# Patient Record
Sex: Male | Born: 1940 | Race: White | Hispanic: No | Marital: Married | Smoking: Former smoker
Health system: Southern US, Community
[De-identification: ages and names within clinical notes are randomized; demographics above are authoritative.]

## PROBLEM LIST (undated history)

## (undated) DIAGNOSIS — C801 Malignant (primary) neoplasm, unspecified: Secondary | ICD-10-CM

## (undated) DIAGNOSIS — I10 Essential (primary) hypertension: Secondary | ICD-10-CM

## (undated) DIAGNOSIS — N189 Chronic kidney disease, unspecified: Secondary | ICD-10-CM

## (undated) DIAGNOSIS — C61 Malignant neoplasm of prostate: Secondary | ICD-10-CM

## (undated) DIAGNOSIS — M1990 Unspecified osteoarthritis, unspecified site: Secondary | ICD-10-CM

## (undated) DIAGNOSIS — H269 Unspecified cataract: Secondary | ICD-10-CM

## (undated) DIAGNOSIS — E785 Hyperlipidemia, unspecified: Secondary | ICD-10-CM

## (undated) DIAGNOSIS — K5790 Diverticulosis of intestine, part unspecified, without perforation or abscess without bleeding: Secondary | ICD-10-CM

## (undated) DIAGNOSIS — E079 Disorder of thyroid, unspecified: Secondary | ICD-10-CM

## (undated) DIAGNOSIS — Z86010 Personal history of colonic polyps: Principal | ICD-10-CM

## (undated) DIAGNOSIS — G5790 Unspecified mononeuropathy of unspecified lower limb: Secondary | ICD-10-CM

## (undated) DIAGNOSIS — I4821 Permanent atrial fibrillation: Secondary | ICD-10-CM

## (undated) DIAGNOSIS — G629 Polyneuropathy, unspecified: Secondary | ICD-10-CM

## (undated) DIAGNOSIS — I639 Cerebral infarction, unspecified: Secondary | ICD-10-CM

## (undated) DIAGNOSIS — K219 Gastro-esophageal reflux disease without esophagitis: Secondary | ICD-10-CM

## (undated) DIAGNOSIS — D126 Benign neoplasm of colon, unspecified: Secondary | ICD-10-CM

## (undated) DIAGNOSIS — K649 Unspecified hemorrhoids: Secondary | ICD-10-CM

## (undated) DIAGNOSIS — I499 Cardiac arrhythmia, unspecified: Secondary | ICD-10-CM

## (undated) DIAGNOSIS — R7303 Prediabetes: Secondary | ICD-10-CM

## (undated) HISTORY — DX: Unspecified cataract: H26.9

## (undated) HISTORY — DX: Disorder of thyroid, unspecified: E07.9

## (undated) HISTORY — DX: Permanent atrial fibrillation: I48.21

## (undated) HISTORY — PX: COLONOSCOPY: SHX5424

## (undated) HISTORY — PX: HERNIA REPAIR: SHX51

## (undated) HISTORY — DX: Essential (primary) hypertension: I10

## (undated) HISTORY — DX: Cerebral infarction, unspecified: I63.9

## (undated) HISTORY — PX: JOINT REPLACEMENT: SHX530

## (undated) HISTORY — PX: SPINE SURGERY: SHX786

## (undated) HISTORY — DX: Hyperlipidemia, unspecified: E78.5

## (undated) HISTORY — PX: TONSILLECTOMY: SHX5217

## (undated) HISTORY — PX: OTHER SURGICAL HISTORY: SHX169

## (undated) HISTORY — DX: Personal history of colonic polyps: Z86.010

## (undated) HISTORY — DX: Benign neoplasm of colon, unspecified: D12.6

## (undated) HISTORY — DX: Polyneuropathy, unspecified: G62.9

## (undated) HISTORY — DX: Diverticulosis of intestine, part unspecified, without perforation or abscess without bleeding: K57.90

## (undated) HISTORY — DX: Malignant neoplasm of prostate: C61

## (undated) HISTORY — DX: Unspecified osteoarthritis, unspecified site: M19.90

## (undated) HISTORY — DX: Unspecified hemorrhoids: K64.9

---

## 2003-04-27 ENCOUNTER — Encounter: Payer: Self-pay | Admitting: Internal Medicine

## 2004-06-28 ENCOUNTER — Ambulatory Visit: Payer: Self-pay | Admitting: Internal Medicine

## 2005-01-13 ENCOUNTER — Ambulatory Visit: Payer: Self-pay | Admitting: Internal Medicine

## 2005-06-02 ENCOUNTER — Ambulatory Visit: Payer: Self-pay | Admitting: Internal Medicine

## 2005-06-19 ENCOUNTER — Ambulatory Visit: Payer: Self-pay | Admitting: Internal Medicine

## 2005-06-27 ENCOUNTER — Ambulatory Visit: Payer: Self-pay | Admitting: Internal Medicine

## 2005-07-31 ENCOUNTER — Ambulatory Visit: Payer: Self-pay | Admitting: Internal Medicine

## 2005-08-18 ENCOUNTER — Ambulatory Visit: Payer: Self-pay | Admitting: Internal Medicine

## 2005-12-09 ENCOUNTER — Ambulatory Visit: Payer: Self-pay | Admitting: Internal Medicine

## 2005-12-16 ENCOUNTER — Ambulatory Visit: Payer: Self-pay | Admitting: Internal Medicine

## 2006-06-11 ENCOUNTER — Ambulatory Visit: Payer: Self-pay | Admitting: Internal Medicine

## 2006-06-11 LAB — CONVERTED CEMR LAB
ALT: 27 units/L (ref 0–40)
AST: 20 units/L (ref 0–37)
Albumin: 3.7 g/dL (ref 3.5–5.2)
Alkaline Phosphatase: 74 units/L (ref 39–117)
BUN: 21 mg/dL (ref 6–23)
Basophils Absolute: 0 10*3/uL (ref 0.0–0.1)
Basophils Relative: 0.6 % (ref 0.0–1.0)
CO2: 30 meq/L (ref 19–32)
Calcium: 9.3 mg/dL (ref 8.4–10.5)
Chloride: 107 meq/L (ref 96–112)
Chol/HDL Ratio, serum: 6.2
Cholesterol: 165 mg/dL (ref 0–200)
Creatinine, Ser: 1.4 mg/dL (ref 0.4–1.5)
Eosinophil percent: 2.7 % (ref 0.0–5.0)
GFR calc non Af Amer: 54 mL/min
Glomerular Filtration Rate, Af Am: 66 mL/min/{1.73_m2}
Glucose, Bld: 92 mg/dL (ref 70–99)
HCT: 49.2 % (ref 39.0–52.0)
HDL: 26.6 mg/dL — ABNORMAL LOW (ref >39.0)
Hemoglobin: 16.5 g/dL (ref 13.0–17.0)
LDL Cholesterol: 110 mg/dL — ABNORMAL HIGH (ref 0–99)
Lymphocytes Relative: 26.5 % (ref 12.0–46.0)
MCHC: 33.7 g/dL (ref 30.0–36.0)
MCV: 92.6 fL (ref 78.0–100.0)
Monocytes Absolute: 0.6 10*3/uL (ref 0.2–0.7)
Monocytes Relative: 9.8 % (ref 3.0–11.0)
Neutro Abs: 4 10*3/uL (ref 1.4–7.7)
Neutrophils Relative %: 60.4 % (ref 43.0–77.0)
PSA: 2.82 ng/mL (ref 0.10–4.00)
Platelets: 220 10*3/uL (ref 150–400)
Potassium: 4.2 meq/L (ref 3.5–5.1)
RBC: 5.31 M/uL (ref 4.22–5.81)
RDW: 12.5 % (ref 11.5–14.6)
Sodium: 145 meq/L (ref 135–145)
TSH: 1.61 microintl units/mL (ref 0.35–5.50)
Total Bilirubin: 0.9 mg/dL (ref 0.3–1.2)
Total Protein: 6.7 g/dL (ref 6.0–8.3)
Triglyceride fasting, serum: 143 mg/dL (ref 0–149)
VLDL: 29 mg/dL (ref 0–40)
WBC: 6.5 10*3/uL (ref 4.5–10.5)

## 2006-06-18 ENCOUNTER — Ambulatory Visit: Payer: Self-pay | Admitting: Internal Medicine

## 2006-06-18 ENCOUNTER — Encounter: Payer: Self-pay | Admitting: Internal Medicine

## 2006-07-01 ENCOUNTER — Ambulatory Visit: Payer: Self-pay | Admitting: Internal Medicine

## 2006-12-17 ENCOUNTER — Ambulatory Visit: Payer: Self-pay | Admitting: Internal Medicine

## 2006-12-17 LAB — CONVERTED CEMR LAB
ALT: 25 units/L (ref 0–40)
AST: 22 units/L (ref 0–37)
Albumin: 3.9 g/dL (ref 3.5–5.2)
Alkaline Phosphatase: 74 units/L (ref 39–117)
Bilirubin, Direct: 0.1 mg/dL (ref 0.0–0.3)
Cholesterol: 176 mg/dL (ref 0–200)
HDL: 29.2 mg/dL — ABNORMAL LOW (ref >39.0)
LDL Cholesterol: 107 mg/dL — ABNORMAL HIGH (ref 0–99)
Total Bilirubin: 0.9 mg/dL (ref 0.3–1.2)
Total CHOL/HDL Ratio: 6
Total Protein: 6.4 g/dL (ref 6.0–8.3)
Triglycerides: 197 mg/dL — ABNORMAL HIGH (ref 0–149)
VLDL: 39 mg/dL (ref 0–40)

## 2007-04-06 ENCOUNTER — Ambulatory Visit: Payer: Self-pay | Admitting: Internal Medicine

## 2007-04-06 DIAGNOSIS — K5730 Diverticulosis of large intestine without perforation or abscess without bleeding: Secondary | ICD-10-CM | POA: Insufficient documentation

## 2007-04-06 DIAGNOSIS — E785 Hyperlipidemia, unspecified: Secondary | ICD-10-CM | POA: Insufficient documentation

## 2007-04-06 DIAGNOSIS — I4891 Unspecified atrial fibrillation: Secondary | ICD-10-CM

## 2007-04-07 DIAGNOSIS — G609 Hereditary and idiopathic neuropathy, unspecified: Secondary | ICD-10-CM | POA: Insufficient documentation

## 2007-04-07 DIAGNOSIS — I10 Essential (primary) hypertension: Secondary | ICD-10-CM

## 2007-04-09 ENCOUNTER — Ambulatory Visit: Payer: Self-pay | Admitting: Internal Medicine

## 2007-07-15 ENCOUNTER — Ambulatory Visit: Payer: Self-pay | Admitting: Internal Medicine

## 2007-07-16 LAB — CONVERTED CEMR LAB
ALT: 30 units/L (ref 0–53)
AST: 23 units/L (ref 0–37)
Albumin: 3.9 g/dL (ref 3.5–5.2)
Alkaline Phosphatase: 78 units/L (ref 39–117)
BUN: 16 mg/dL (ref 6–23)
Bilirubin, Direct: 0.2 mg/dL (ref 0.0–0.3)
CO2: 29 meq/L (ref 19–32)
Calcium: 9.3 mg/dL (ref 8.4–10.5)
Chloride: 104 meq/L (ref 96–112)
Cholesterol: 182 mg/dL (ref 0–200)
Creatinine, Ser: 1.4 mg/dL (ref 0.4–1.5)
GFR calc Af Amer: 65 mL/min
GFR calc non Af Amer: 54 mL/min
Glucose, Bld: 95 mg/dL (ref 70–99)
HDL: 27.9 mg/dL — ABNORMAL LOW (ref >39.0)
LDL Cholesterol: 128 mg/dL — ABNORMAL HIGH (ref 0–99)
PSA: 3.31 ng/mL (ref 0.10–4.00)
Potassium: 4.7 meq/L (ref 3.5–5.1)
Sodium: 143 meq/L (ref 135–145)
TSH: 1.3 microintl units/mL (ref 0.35–5.50)
Total Bilirubin: 1.1 mg/dL (ref 0.3–1.2)
Total CHOL/HDL Ratio: 6.5
Total Protein: 6.7 g/dL (ref 6.0–8.3)
Triglycerides: 133 mg/dL (ref 0–149)
VLDL: 27 mg/dL (ref 0–40)

## 2008-02-09 ENCOUNTER — Ambulatory Visit: Payer: Self-pay | Admitting: Internal Medicine

## 2008-02-09 LAB — CONVERTED CEMR LAB
ALT: 31 units/L (ref 0–53)
Bilirubin, Direct: 0.2 mg/dL (ref 0.0–0.3)
HDL: 29.9 mg/dL — ABNORMAL LOW (ref >39.0)
LDL Cholesterol: 125 mg/dL — ABNORMAL HIGH (ref 0–99)
Total Bilirubin: 1.1 mg/dL (ref 0.3–1.2)
Total CHOL/HDL Ratio: 6.3
VLDL: 34 mg/dL (ref 0–40)

## 2008-08-10 ENCOUNTER — Ambulatory Visit: Payer: Self-pay | Admitting: Internal Medicine

## 2008-08-11 LAB — CONVERTED CEMR LAB
ALT: 28 units/L (ref 0–53)
AST: 22 units/L (ref 0–37)
Albumin: 3.8 g/dL (ref 3.5–5.2)
BUN: 19 mg/dL (ref 6–23)
Calcium: 9.4 mg/dL (ref 8.4–10.5)
Chloride: 105 meq/L (ref 96–112)
Cholesterol: 171 mg/dL (ref 0–200)
Creatinine, Ser: 1.6 mg/dL — ABNORMAL HIGH (ref 0.4–1.5)
GFR calc non Af Amer: 46 mL/min
HDL: 31 mg/dL — ABNORMAL LOW (ref >39.0)
LDL Cholesterol: 102 mg/dL — ABNORMAL HIGH (ref 0–99)
Total CHOL/HDL Ratio: 5.5
Triglycerides: 189 mg/dL — ABNORMAL HIGH (ref 0–149)

## 2008-08-25 ENCOUNTER — Ambulatory Visit: Payer: Self-pay | Admitting: Internal Medicine

## 2008-09-20 ENCOUNTER — Encounter: Payer: Self-pay | Admitting: Internal Medicine

## 2008-09-20 ENCOUNTER — Ambulatory Visit: Payer: Self-pay

## 2009-02-14 ENCOUNTER — Ambulatory Visit: Payer: Self-pay | Admitting: Internal Medicine

## 2009-02-16 LAB — CONVERTED CEMR LAB
ALT: 27 units/L (ref 0–53)
AST: 23 units/L (ref 0–37)
BUN: 21 mg/dL (ref 6–23)
Bilirubin, Direct: 0 mg/dL (ref 0.0–0.3)
Calcium: 9.1 mg/dL (ref 8.4–10.5)
Cholesterol: 175 mg/dL (ref 0–200)
GFR calc non Af Amer: 49.52 mL/min (ref >60)
Glucose, Bld: 92 mg/dL (ref 70–99)
HDL: 32.4 mg/dL — ABNORMAL LOW (ref >39.00)
TSH: 1.38 microintl units/mL (ref 0.35–5.50)
Total Bilirubin: 1 mg/dL (ref 0.3–1.2)
Total Protein: 6.9 g/dL (ref 6.0–8.3)

## 2009-08-29 ENCOUNTER — Ambulatory Visit: Payer: Self-pay | Admitting: Internal Medicine

## 2009-09-06 ENCOUNTER — Telehealth: Payer: Self-pay | Admitting: Internal Medicine

## 2010-02-27 ENCOUNTER — Ambulatory Visit: Payer: Self-pay | Admitting: Internal Medicine

## 2010-02-28 LAB — CONVERTED CEMR LAB
ALT: 31 units/L (ref 0–53)
AST: 23 units/L (ref 0–37)
Alkaline Phosphatase: 72 units/L (ref 39–117)
Bilirubin, Direct: 0.1 mg/dL (ref 0.0–0.3)
Calcium: 8.3 mg/dL — ABNORMAL LOW (ref 8.4–10.5)
Eosinophils Relative: 2.6 % (ref 0.0–5.0)
GFR calc non Af Amer: 61.5 mL/min (ref >60)
Glucose, Bld: 91 mg/dL (ref 70–99)
HCT: 45.5 % (ref 39.0–52.0)
Lymphs Abs: 1.2 10*3/uL (ref 0.7–4.0)
MCHC: 34.3 g/dL (ref 30.0–36.0)
MCV: 93.8 fL (ref 78.0–100.0)
Monocytes Absolute: 0.5 10*3/uL (ref 0.1–1.0)
Platelets: 185 10*3/uL (ref 150.0–400.0)
RDW: 13.4 % (ref 11.5–14.6)
Sodium: 135 meq/L (ref 135–145)
Total Bilirubin: 0.9 mg/dL (ref 0.3–1.2)

## 2010-08-30 ENCOUNTER — Ambulatory Visit
Admission: RE | Admit: 2010-08-30 | Discharge: 2010-08-30 | Payer: Self-pay | Source: Home / Self Care | Attending: Internal Medicine | Admitting: Internal Medicine

## 2010-08-30 ENCOUNTER — Encounter: Payer: Self-pay | Admitting: Internal Medicine

## 2010-08-30 DIAGNOSIS — R350 Frequency of micturition: Secondary | ICD-10-CM | POA: Insufficient documentation

## 2010-08-30 LAB — CONVERTED CEMR LAB
Bilirubin Urine: NEGATIVE
Blood in Urine, dipstick: NEGATIVE
Glucose, Urine, Semiquant: NEGATIVE
Ketones, urine, test strip: NEGATIVE
Specific Gravity, Urine: >=1.03
pH: 5

## 2010-09-03 NOTE — Progress Notes (Signed)
Summary: please update pharmacy info  Phone Note Call from Patient Call back at Home Phone [phone]   Caller: Patient-live call Summary of Call: Was seen on 08-29-2009. [facility] Pt stated that he uses [facility] on [address]. Please resend and note this pharmacy in the refill section in chart. Thanks. Initial call taken by: Warnell Forester,  September 06, 2009 1:50 PM  Follow-up for Phone Call        left message for pt to call back with which [facility] [address].  One is [phone] from [address] and the other is [phone] [place].  Pt to call back. Follow-up by: Gladis Riffle, RN,  September 07, 2009 10:31 AM  Additional Follow-up for Phone Call Additional follow up Details #1::        [facility] [address] [facility] [address] Additional Follow-up by: Gladis Riffle, RN,  September 07, 2009 12:12 PM

## 2010-09-03 NOTE — Assessment & Plan Note (Signed)
Summary: 6 month rov/njr   Vital Signs:  Patient profile:   70 year old male Height:      74.75 inches Weight:      224 pounds BMI:     28.29 Temp:     99.0 degrees F oral Pulse rate:   64 / minute Pulse rhythm:   regular BP sitting:   160 / 102  (left arm) Cuff size:   regular  Vitals Entered By: Kern Reap CMA Duncan Dull) (February 27, 2010 8:01 AM) CC: follow-up visit   CC:  follow-up visit.  History of Present Illness:  Follow-Up Visit      This is a 70 year old man who presents for Follow-up visit.  The patient denies chest pain and palpitations.  Since the last visit the patient notes no new problems or concerns.  The patient reports taking meds as prescribed.  When questioned about possible medication side effects, the patient notes none.    All other systems reviewed and were negative   Current Problems (verified): 1)  Peripheral Neuropathy  (ICD-356.9) 2)  Hypertension  (ICD-401.9) 3)  Hyperlipidemia  (ICD-272.4) 4)  Diverticulosis, Colon  (ICD-562.10) 5)  Atrial Fibrillation  (ICD-427.31)  Current Medications (verified): 1)  Simvastatin 20 Mg Tabs (Simvastatin) .... One By Mouth Daily As Directed 2)  Toprol Xl 50 Mg  Tb24 (Metoprolol Succinate) .... One By Mouth Daily 3)  Aspirin 325 Mg Tabs (Aspirin) .... 1/2 Once Daily or 81mg  Once Daily  Allergies (verified): No Known Drug Allergies  Past History:  Past Medical History: Last updated: 02/09/2008 Atrial fibrillation Hypertension Peripheral neuropathy--chronic, has had neurology evaluation  Past Surgical History: Last updated: 08/10/2008 Eye Surgery  Family History: Last updated: 04/06/2007 Family History Diabetes 1st degree relative Family History Hypertension Family History of Cardiovascular disorder  Social History: Last updated: 04/06/2007 Retired Married Former Smoker Alcohol use-no  Risk Factors: Smoking Status: quit (08/29/2009)  Physical Exam  General:  alert and well-developed.     Head:  normocephalic and atraumatic.   Eyes:  pupils equal and pupils round.   Ears:  R ear normal and L ear normal.   Neck:  No deformities, masses, or tenderness noted. Chest Wall:  No deformities, masses, tenderness or gynecomastia noted. Lungs:  Normal respiratory effort, chest expands symmetrically. Lungs are clear to auscultation, no crackles or wheezes. Heart:  normal rate and irregular rhythm.   Abdomen:  soft and non-tender.   Msk:  No deformity or scoliosis noted of thoracic or lumbar spine.   Neurologic:  cranial nerves II-XII intact and gait normal.   Skin:  turgor normal and color normal.   Psych:  good eye contact and not anxious appearing.     Impression & Recommendations:  Problem # 1:  PERIPHERAL NEUROPATHY (ICD-356.9) constant problem relatively unchanged  Problem # 2:  HYPERTENSION (ICD-401.9)  repeat BP 138/88 will continue same meds His updated medication list for this problem includes:    Toprol Xl 50 Mg Tb24 (Metoprolol succinate) ..... One by mouth daily  Orders: TLB-BMP (Basic Metabolic Panel-BMET) (80048-METABOL)  Problem # 3:  ATRIAL FIBRILLATION (ICD-427.31)  no sxs rate controlled discussed anticoagulation His updated medication list for this problem includes:    Toprol Xl 50 Mg Tb24 (Metoprolol succinate) ..... One by mouth daily    Aspirin 325 Mg Tabs (Aspirin) .Marland Kitchen... 1/2 once daily or 81mg  once daily  Orders: TLB-TSH (Thyroid Stimulating Hormone) (84443-TSH) TLB-CBC Platelet - w/Differential (85025-CBCD)  Complete Medication List: 1)  Simvastatin 20  Mg Tabs (Simvastatin) .... One by mouth daily as directed 2)  Toprol Xl 50 Mg Tb24 (Metoprolol succinate) .... One by mouth daily 3)  Aspirin 325 Mg Tabs (Aspirin) .... 1/2 once daily or 81mg  once daily  Other Orders: Venipuncture (02725) TLB-Lipid Panel (80061-LIPID) TLB-Hepatic/Liver Function Pnl (80076-HEPATIC)  Appended Document: Orders Update     Clinical Lists  Changes  Orders: Added new Service order of Specimen Handling (36644) - Signed

## 2010-09-03 NOTE — Assessment & Plan Note (Signed)
Summary: 6 month rov/njr rsc bmp/njr---PT RSC (BMP) // RS   Vital Signs:  Patient profile:   70 year old male Weight:      228 pounds Temp:     98.3 degrees F Pulse rate:   68 / minute Resp:     12 per minute BP sitting:   124 / 76  (left arm)  Vitals Entered By: Gladis Riffle, RN (August 29, 2009 8:18 AM)   History of Present Illness:  Follow-Up Visit      This is a 70 year old man who presents for Follow-up visit.  The patient denies chest pain, palpitations, dizziness, syncope, edema, SOB, DOE, PND, and orthopnea.  Since the last visit the patient notes no new problems or concerns.  The patient reports taking meds as prescribed.  When questioned about possible medication side effects, the patient notes none.    All other systems reviewed and were negative   Preventive Screening-Counseling & Management  Alcohol-Tobacco     Smoking Status: quit  Current Problems (verified): 1)  Peripheral Neuropathy  (ICD-356.9) 2)  Hypertension  (ICD-401.9) 3)  Hyperlipidemia  (ICD-272.4) 4)  Diverticulosis, Colon  (ICD-562.10) 5)  Atrial Fibrillation  (ICD-427.31)  Current Medications (verified): 1)  Simvastatin 20 Mg Tabs (Simvastatin) .... One By Mouth Daily As Directed 2)  Toprol Xl 50 Mg  Tb24 (Metoprolol Succinate) .... One By Mouth Daily 3)  Aspirin 325 Mg Tabs (Aspirin) .... 1/2 Once Daily or 81mg  Once Daily  Allergies (verified): No Known Drug Allergies  Comments:  Nurse/Medical Assistant: 6 month rov, fasting  The patient's medications and allergies were reviewed with the patient and were updated in the Medication and Allergy Lists. Gladis Riffle, RN (August 29, 2009 8:19 AM)  Past History:  Past Medical History: Last updated: 02/09/2008 Atrial fibrillation Hypertension Peripheral neuropathy--chronic, has had neurology evaluation  Past Surgical History: Last updated: 08/10/2008 Eye Surgery  Family History: Last updated: 04/06/2007 Family History Diabetes 1st  degree relative Family History Hypertension Family History of Cardiovascular disorder  Social History: Last updated: 04/06/2007 Retired Married Former Smoker Alcohol use-no  Risk Factors: Smoking Status: quit (08/29/2009)  Physical Exam  General:  alert and well-developed.   Head:  normocephalic and atraumatic.   Eyes:  pupils equal and pupils round.   Ears:  R ear normal and L ear normal.   Neck:  No deformities, masses, or tenderness noted. Chest Wall:  No deformities, masses, tenderness or gynecomastia noted. Lungs:  Normal respiratory effort, chest expands symmetrically. Lungs are clear to auscultation, no crackles or wheezes. Heart:  normal rate and regular rhythm.   Abdomen:  soft and non-tender.   Msk:  No deformity or scoliosis noted of thoracic or lumbar spine.   Pulses:  R radial normal and L radial normal.   Neurologic:  alert & oriented X3 and cranial nerves II-XII intact.   Skin:  turgor normal and color normal.   Psych:  normally interactive and good eye contact.     Impression & Recommendations:  Problem # 1:  PERIPHERAL NEUROPATHY (ICD-356.9)  discussed no need for eval or treatment at this time  Orders: Prescription Created Electronically [phone])  Problem # 2:  HYPERTENSION (ICD-401.9) controlled His updated medication list for this problem includes:    Toprol Xl 50 Mg Tb24 (Metoprolol succinate) ..... One by mouth daily  BP today: 124/76 Prior BP: 140/86 (02/14/2009)  Labs Reviewed: K+: 4.5 (02/14/2009) Creat: : 1.5 (02/14/2009)   Chol: 175 (02/14/2009)   HDL:  32.40 (02/14/2009)   LDL: 108 (02/14/2009)   TG: 173.0 (02/14/2009)  Orders: Prescription Created Electronically [phone])  Problem # 3:  ATRIAL FIBRILLATION (ICD-427.31)  discussed  at length disucssed pradaxa-side effects discussed he will consider informaton given His updated medication list for this problem includes:    Toprol Xl 50 Mg Tb24 (Metoprolol succinate) ..... One by  mouth daily    Aspirin 325 Mg Tabs (Aspirin) .Marland Kitchen... 1/2 once daily or 81mg  once daily  Orders: Prescription Created Electronically [phone])  Complete Medication List: 1)  Simvastatin 20 Mg Tabs (Simvastatin) .... One by mouth daily as directed 2)  Toprol Xl 50 Mg Tb24 (Metoprolol succinate) .... One by mouth daily 3)  Aspirin 325 Mg Tabs (Aspirin) .... 1/2 once daily or 81mg  once daily  Preventive Care Screening  Colonoscopy:    Date:  08/05/2003    Next Due:  09/2019    Results:  normal-pt's report    Patient Instructions: 1)  Please schedule a follow-up appointment in 6 months. 2)  look up pradaxa (on line) Prescriptions: TOPROL XL 50 MG  TB24 (METOPROLOL SUCCINATE) one by mouth daily  #90 x 3   Entered and Authorized by:   Birdie Sons MD   Signed by:   Birdie Sons MD on 08/29/2009   Method used:   [facility] to        [facility]  [phone]* (retail)       [address] [place], [place]  [address]       Ph: [phone] or [phone]       Fax: [phone]   RxID:   [id number] SIMVASTATIN 20 MG TABS (SIMVASTATIN) one by mouth daily as directed  #30 Tablet x 4   Entered and Authorized by:   Birdie Sons MD   Signed by:   Birdie Sons MD on 08/29/2009   Method used:   [facility]        [facility]  [phone]* (retail)       [address] [place], [place]  [address]       Ph: [phone] or [phone]       Fax: [phone]   RxID:   [id number]

## 2010-09-05 NOTE — Assessment & Plan Note (Signed)
Summary: ?prostate inf/njr   Vital Signs:  Patient profile:   70 year old male Weight:      229 pounds Temp:     99.5 degrees F oral BP sitting:   118 / 72  (right arm) Cuff size:   regular  Vitals Entered By: Duard Brady LPN (August 30, 2010 8:15 AM) CC: c/o frequent urination , achy joints , ??prostate infection Is Patient Diabetic? No   CC:  c/o frequent urination , achy joints , and ??prostate infection.  Allergies (verified): No Known Drug Allergies   Complete Medication List: 1)  Simvastatin 20 Mg Tabs (Simvastatin) .... One by mouth daily as directed 2)  Toprol Xl 50 Mg Tb24 (Metoprolol succinate) .... One by mouth daily 3)  Aspirin 325 Mg Tabs (Aspirin) .... 1/2 once daily or 81mg  once daily 4)  Ciprofloxacin Hcl 500 Mg Tabs (Ciprofloxacin hcl) .... One twice daily  Other Orders: UA Dipstick W/ Micro (manual) (45409)  Patient Instructions: 1)  Limit your Sodium (Salt). 2)  Take your antibiotic as prescribed until ALL of it is gone, but stop if you develop a rash or swelling and contact our office as soon as possible. 3)  CPX  Dr Cato Mulligan 4-6 months Prescriptions: CIPROFLOXACIN HCL 500 MG TABS (CIPROFLOXACIN HCL) one twice daily  #50 x 0   Entered and Authorized by:   Gordy Savers  MD   Signed by:   Gordy Savers  MD on 08/30/2010   Method used:   Electronically to        [facility]. [phone]* (retail)       [phone] [facility].       [place], [place]  [address]       Ph: [phone]       Fax: [phone]   RxID:   [id number] TOPROL XL 50 MG  TB24 (METOPROLOL SUCCINATE) one by mouth daily  #30 Tablet x 3   Entered and Authorized by:   Gordy Savers  MD   Signed by:   Gordy Savers  MD on 08/30/2010   Method used:   Electronically to        [facility]. [phone]* (retail)       [phone] [facility].       [place], [place]  [address]       Ph: [phone]  Fax: [phone]   RxID:   [id number] SIMVASTATIN 20 MG TABS (SIMVASTATIN) one by mouth daily as directed  #30 Tablet x 11   Entered and Authorized by:   Gordy Savers  MD   Signed by:   Gordy Savers  MD on 08/30/2010   Method used:   Electronically to        [facility]. [phone]* (retail)       [phone] [facility]       [place], [place]  [address]       Ph: [phone]       Fax: [phone]   RxID:   [id number]    Orders Added: 1)  UA Dipstick W/ Micro (manual) [81000]    Laboratory Results   Urine Tests  Date/Time Received: August 30, 2010 8:22 AM  Date/Time Reported: August 30, 2010 8:22 AM   Routine Urinalysis   Color: straw Appearance: Hazy Glucose: negative   (Normal Range:  Negative) Bilirubin: negative   (Normal Range: Negative) Ketone: negative   (Normal Range: Negative) Spec. Gravity: >=1.030   (Normal Range: 1.003-1.035) Blood: negative   (Normal Range: Negative) pH: 5.0   (Normal Range: 5.0-8.0) Protein: trace   (Normal Range: Negative) Urobilinogen: 0.2   (Normal Range: 0-1) Nitrite: negative   (Normal Range: Negative) Leukocyte Esterace: small   (Normal Range: Negative)       Appended Document: ?prostate inf/njr  70 year old patient who presents with a several-day history urinary frequency, mild discomfort low-grade fever and mild myalgias.  He states that he has remote history of prostate infections in the past.  He has a history of permanent atrial for ablation, but has declined Coumadin anticoagulation.  He has mild treated hypertension and dyslipidemia. Examination temperature 99.5 blood pressure 119/72  ENT-unremarkable Neck- no adenopathy chest clear cardio-vascular- irregular rhythm with controlled ventricular response abdomen-soft and nontender.  No organomegaly  Impression acute prostatitis hypertension stable chronic atrial fibrillation  Disposition.  Will treat with a 25 day  supply of Cipro 500 mg b.i.d..  will set up for a full physical

## 2010-09-05 NOTE — Miscellaneous (Signed)
Summary: Orders Update  Clinical Lists Changes  Orders: Added new Service order of Est. Patient Level III (99213) - Signed 

## 2010-09-13 ENCOUNTER — Ambulatory Visit (INDEPENDENT_AMBULATORY_CARE_PROVIDER_SITE_OTHER): Payer: Medicare Other | Admitting: Internal Medicine

## 2010-09-13 ENCOUNTER — Encounter: Payer: Self-pay | Admitting: Internal Medicine

## 2010-09-13 DIAGNOSIS — N419 Inflammatory disease of prostate, unspecified: Secondary | ICD-10-CM

## 2010-09-13 DIAGNOSIS — R350 Frequency of micturition: Secondary | ICD-10-CM

## 2010-09-13 DIAGNOSIS — I10 Essential (primary) hypertension: Secondary | ICD-10-CM

## 2010-09-13 MED ORDER — DOXYCYCLINE HYCLATE 100 MG PO TABS: 100.0000 mg | ORAL_TABLET | Freq: Two times a day (BID) | ORAL | Status: AC

## 2010-09-13 MED ORDER — TAMSULOSIN HCL 0.4 MG PO CAPS: 0.4000 mg | ORAL_CAPSULE | Freq: Every day | ORAL | Status: DC

## 2010-09-13 MED ORDER — DOXYCYCLINE HYCLATE 100 MG PO TABS: 100.0000 mg | ORAL_TABLET | Freq: Two times a day (BID) | ORAL | Status: DC

## 2010-09-13 NOTE — Patient Instructions (Signed)
Take your antibiotic as prescribed until ALL of it is gone, but stop if you develop a rash, swelling, or any side effects of the medication.  Contact our office as soon as possible if  there are side effects of the medication.  Call or return to clinic prn if these symptoms worsen or fail to improve as anticipated.  

## 2010-09-13 NOTE — Progress Notes (Signed)
  Subjective:    Patient ID: Elijah [name], male    DOB: [birth year], 70 y.o.   MRN: [id number]  HPI  70 year old patient who has a remote history of acute prostatitis, who was seen two weeks ago with urinary frequency, urgency, and slow urinary stream.  He was placed on Cipro 5oo  mg b.i.d. and during   the past two weeks symptoms have not improved.  He denies any fever, chills, or constitutional complaints.  No hematuria, or pyuria.  Denies any back or flank pain.   Review of Systems  Genitourinary: Positive for urgency, frequency, decreased urine volume and difficulty urinating. Negative for hematuria, flank pain, discharge, penile swelling, scrotal swelling, genital sores, penile pain and testicular pain.       Objective:   Physical Exam  Genitourinary: Rectum normal and penis normal. Guaiac negative stool. No penile tenderness.       Prostate plus 3 enlarged and slightly tender          Assessment & Plan:  Acute prostatitis-  Will discontinue Cipro and start doxycycline, which has been helpful in the past.  He is quite bothered by his obstructive symptoms and will also placed on Flomax.  He will call if unimproved.  He is scheduled for a physical in the summer.  Laboratory studies included a PSA will be checked at that time

## 2010-12-17 NOTE — Letter (Signed)
August 25, 2008    Bruce H. Swords, MD  [address] [place], [place] [address]   RE:  IFEANYICHUKWU, WICKHAM  MRN:  [id number]  /  DOB:  1940/09/11   Dear Dr.  Cato Mulligan:   It was my pleasure to see your patient, Rykker Coviello in electrophysiology  consultation today.  As you recall, he is a very pleasant 69 year old  gentleman with a history of persistent atrial fibrillation and  hypertension.  He reports initially being diagnosed with atrial  fibrillation around 1998 after presenting to his primary care physician  for routine physical.  He was noted to have an irregular pulse and his  EKG documented atrial fibrillation at that time.  He is unable to recall  when any symptoms of atrial fibrillation may have initially began.  He  reports tolerating atrial fibrillation quite well and has never been  cardioverted or treated with antiarrhythmic medications.  As prior  strategy has been primarily rate control.  He is previously declined  Coumadin therapy.  He reports doing rather well recently.  He is unaware  of any significant limitations to his quality of life or activities.  He  reports that he is able to do his own yard work and activities around  the house.  Though, he has previously performed regular exercise he has  not done this over the past few months.  He denies any prior  cerebrovascular sequela from his atrial fibrillation.  He denies any  recent episodes of chest pain, shortness of breath, orthopnea, PND,  palpitations, presyncope, syncope, or fatigue.  He is otherwise without  complaint today.   PAST MEDICAL HISTORY:  1. Hypertension.  2. Peripheral neuropathy.  3. Persistent atrial fibrillation (as above).  4. He denies any history of rheumatic heart disease.   Transthoracic echocardiogram performed, November 19, 2000, revealed a  normal left ventricular size and function.  The left atrium was mildly  dilated.  The patient was noted to have mild mitral regurgitation at  that time.   MEDICATIONS:  1. Aspirin 81 mg daily.  2. Simvastatin 10 mg daily.  3. Toprol-XL 50 mg daily.   ALLERGIES:  No known drug allergies.   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  He is retired from [employer].  He has a history  of infrequent tobacco use, but quit in 1970.  He denies alcohol or drug  use.   FAMILY HISTORY:  He is unaware of any significant family history  including diabetes and hypertension.  His parents are both alive and  healthy.   REVIEW OF SYSTEMS:  All systems were reviewed and negative except as  outlined in the HPI above.   PHYSICAL EXAMINATION:  VITAL SIGNS:  Blood pressure 126/90, heart rate  83, respirations 18, and weight 233 pounds.  GENERAL:  The patient is a well-appearing male in no acute distress.  He  is alert and oriented x3.  HEENT:  Normocephalic and atraumatic.  Sclerae clear.  Conjunctivae  pink.  Oropharynx clear.  NECK:  Supple.  No thyromegaly, lymphadenopathy, or bruits.  LUNGS:  Clear to auscultation bilaterally.  HEART:  Irregularly irregular rhythm.  No murmurs, rubs, or gallops.  GI:  Soft, nontender, and nondistended.  Positive bowel sounds.  EXTREMITIES:  No clubbing, cyanosis, or edema.  NEUROLOGIC:  Strength and sensation are intact.  SKIN:  No ecchymosis or lacerations.  MUSCULOSKELETAL:  No deformity or atrophy.  PSYCHIATRIC:  Euthymic mood.  Full affect.   EKG today reveals atrial fibrillation with an average ventricular rate  of 80 beats per minute, otherwise unremarkable.   IMPRESSION:  Mr. Osoria is a pleasant 70 year old gentleman with a  history of persistent atrial fibrillation and hypertension.  His blood  pressure appears to be reasonably well controlled.  The patient is  relatively asymptomatic from atrial fibrillation and appears presently  to be rate controlled at rest.  He has previously declined  anticoagulation with Coumadin.   Therapeutic  strategies for atrial fibrillation including both medicine  and catheter-based therapies were discussed in detail today.  The  patient's CHADS2 score is 1.  Based on a AHA/ACC guidelines, the  recommendation is either aspirin 325 mg daily or Coumadin for stroke  prevention.  I have discussed risks and benefits of Coumadin with the  patient.  He would prefer to continue his present strategy of aspirin  alone.  I think that this is a reasonable strategy though we can  certainly not guarantee that he will not have a cerebrovascular event.  I recommended that the patient assess his heart rate with regular  activities at home.  He is aware that his heart rate should be less than  80 beats per minute at rest and ideally less than 100 beats per minute  with regular activities around his house.  Should he have any  significant increase in heart rate then I think that a 24-hour Holter  would be beneficial to assess his heart rate control.  Presently, I  think that we should continue his regular dose of Toprol-XL.  As he is  minimally symptomatic from atrial fibrillation, I think that we should  continue a rate control strategy at this time.  Neither antiarrhythmic  medications or catheter ablation have been shown to prolong life or  reduce risk for stroke.  I, therefore think that these measures should  be reserved for any impairment in quality of life related to atrial  fibrillation.  Should he develop symptomatic atrial fibrillation down  the road and we could consider a rhythm control strategy; however, as he  has had prolonged atrial fibrillation, I think that this would be a  formidable task.  I think that we should obtain a transthoracic  echocardiogram to evaluate size of his left atrium and also to evaluate  for any progressive mitral valve disease.   PLAN:  1. Aspirin 325 mg daily.  2. Toprol-XL 50 mg daily.  3. The patient is encouraged to check his pulse regularly at home and       contact our office if he has rapid ventricular rates.  4. Transthoracic echocardiogram is ordered.  5. The patient will follow up in my office as needed.  6. The patient will follow up with Dr. Cato Mulligan as previously scheduled.   Thank you for the opportunity of participating in the care of Mr. Brennen.  Please feel free to contact me  if I can be of further assistance.    Sincerely,      Hillis Range, MD  Electronically Signed    JA/MedQ  DD: 08/25/2008  DT: 08/26/2008  Job #: [id number]

## 2010-12-25 ENCOUNTER — Ambulatory Visit: Payer: Self-pay | Admitting: Internal Medicine

## 2011-01-01 ENCOUNTER — Ambulatory Visit: Payer: Self-pay | Admitting: Internal Medicine

## 2011-02-17 ENCOUNTER — Encounter: Payer: Self-pay | Admitting: Internal Medicine

## 2011-02-17 ENCOUNTER — Ambulatory Visit (INDEPENDENT_AMBULATORY_CARE_PROVIDER_SITE_OTHER): Payer: Medicare Other | Admitting: Internal Medicine

## 2011-02-17 DIAGNOSIS — K625 Hemorrhage of anus and rectum: Secondary | ICD-10-CM

## 2011-02-17 DIAGNOSIS — I10 Essential (primary) hypertension: Secondary | ICD-10-CM

## 2011-02-17 MED ORDER — HYDROCORTISONE ACETATE 25 MG RE SUPP: 25.0000 mg | Freq: Two times a day (BID) | RECTAL | Status: AC

## 2011-02-17 NOTE — Patient Instructions (Signed)
Medications as directed Call or return to clinic prn if these symptoms worsen or fail to improve as anticipated.

## 2011-02-17 NOTE — Progress Notes (Signed)
  Subjective:    Patient ID: Elijah [name], male    DOB: Jun 25, 1941, 70 y.o.   MRN: [id number]  HPI 70 year old patient who presents with a 2 to three-week history of small-volume bright red rectal bleeding. He does describe a small amount of fresh blood on the tissue paper. Denies any rectal pain or change in his bowel habits. He did have a colonoscopy in 2004   Review of Systems  Gastrointestinal: Positive for blood in stool and anal bleeding. Negative for abdominal pain, constipation and abdominal distention.       Objective:   Physical Exam  Genitourinary: Rectum normal. Guaiac negative stool.       Rectal exam was nonrevealing no obvious internal hemorrhoid. No significant stool- heme negative          Assessment & Plan:   Bright red rectal bleeding. We'll treat with Anusol-HC suppositories twice a day and clinically observe. He will call if bleeding persists

## 2011-03-12 ENCOUNTER — Encounter: Payer: Self-pay | Admitting: Gastroenterology

## 2011-03-12 ENCOUNTER — Encounter: Payer: Self-pay | Admitting: Internal Medicine

## 2011-03-12 ENCOUNTER — Telehealth: Payer: Self-pay | Admitting: Gastroenterology

## 2011-03-12 ENCOUNTER — Ambulatory Visit (INDEPENDENT_AMBULATORY_CARE_PROVIDER_SITE_OTHER): Payer: Medicare Other | Admitting: Internal Medicine

## 2011-03-12 VITALS — BP 126/74 | Temp 98.2°F | Wt 230.0 lb

## 2011-03-12 DIAGNOSIS — K625 Hemorrhage of anus and rectum: Secondary | ICD-10-CM | POA: Insufficient documentation

## 2011-03-12 DIAGNOSIS — I4891 Unspecified atrial fibrillation: Secondary | ICD-10-CM

## 2011-03-12 NOTE — Assessment & Plan Note (Signed)
Reviewed previous colonoscopy Given duration of sxs I think he needs another colonoscopy (it's been 8 years)

## 2011-03-12 NOTE — Telephone Encounter (Signed)
error 

## 2011-03-12 NOTE — Progress Notes (Signed)
  Subjective:    Patient ID: Elijah [name], male    DOB: 1940/12/01, 70 y.o.   MRN: [id number]  HPI  Rectal bleed-see dr. Charm Rings note. Still notes some bleeding on tissue paper and in between BMs No pain.   No significant hemorrhage  Review of Systems  patient denies chest pain, shortness of breath, orthopnea. Denies lower extremity edema, abdominal pain, change in appetite, change in bowel movements. Patient denies rashes, musculoskeletal complaints. No other specific complaints in a complete review of systems.      Objective:   Physical Exam  well-developed well-nourished male in no acute distress. HEENT exam atraumatic, normocephalic, neck supple without jugular venous distention. Chest clear to auscultation cardiac exam S1-S2 are regular. Abdominal exam overweight with bowel sounds, soft and nontender. Extremities no edema. Neurologic exam is alert with a normal gait.     Assessment & Plan:

## 2011-03-12 NOTE — Telephone Encounter (Signed)
Left a message for the patient to return my call.  

## 2011-03-12 NOTE — Progress Notes (Signed)
Agree with Dr. Cato Mulligan Will have my staff contact patient to let him know this and arrange for direct colonoscopy re: rectal bleeding/hematochezia  Sophia, please contact him and  schedule a pre-visit and colonoscopy for him

## 2011-03-13 NOTE — Telephone Encounter (Signed)
Patient returned my call and stated that Dr. Marliss Coots office had set him up for a office visit to see Dr. Leone Payor on 04/15/11.

## 2011-03-23 ENCOUNTER — Other Ambulatory Visit: Payer: Self-pay | Admitting: Internal Medicine

## 2011-04-10 ENCOUNTER — Ambulatory Visit (INDEPENDENT_AMBULATORY_CARE_PROVIDER_SITE_OTHER): Payer: Medicare Other | Admitting: Internal Medicine

## 2011-04-10 ENCOUNTER — Ambulatory Visit: Payer: Managed Care, Other (non HMO) | Admitting: Internal Medicine

## 2011-04-10 ENCOUNTER — Encounter: Payer: Self-pay | Admitting: Internal Medicine

## 2011-04-10 DIAGNOSIS — I4891 Unspecified atrial fibrillation: Secondary | ICD-10-CM

## 2011-04-10 DIAGNOSIS — E785 Hyperlipidemia, unspecified: Secondary | ICD-10-CM

## 2011-04-10 DIAGNOSIS — N320 Bladder-neck obstruction: Secondary | ICD-10-CM

## 2011-04-10 DIAGNOSIS — I10 Essential (primary) hypertension: Secondary | ICD-10-CM

## 2011-04-10 LAB — CBC WITH DIFFERENTIAL/PLATELET
Basophils Absolute: 0.1 10*3/uL (ref 0.0–0.1)
Basophils Relative: 1.1 % (ref 0.0–3.0)
Eosinophils Absolute: 0.2 10*3/uL (ref 0.0–0.7)
Lymphocytes Relative: 19.7 % (ref 12.0–46.0)
MCHC: 33.7 g/dL (ref 30.0–36.0)
Monocytes Relative: 6.7 % (ref 3.0–12.0)
Neutrophils Relative %: 69.8 % (ref 43.0–77.0)
RBC: 5.09 Mil/uL (ref 4.22–5.81)
RDW: 13.3 % (ref 11.5–14.6)

## 2011-04-10 LAB — LIPID PANEL
Cholesterol: 171 mg/dL (ref 0–200)
Triglycerides: 151 mg/dL — ABNORMAL HIGH (ref 0.0–149.0)
VLDL: 30.2 mg/dL (ref 0.0–40.0)

## 2011-04-10 LAB — HEPATIC FUNCTION PANEL
ALT: 27 U/L (ref 0–53)
Albumin: 3.9 g/dL (ref 3.5–5.2)
Bilirubin, Direct: 0.2 mg/dL (ref 0.0–0.3)
Total Protein: 6.7 g/dL (ref 6.0–8.3)

## 2011-04-10 LAB — TSH: TSH: 1.33 u[IU]/mL (ref 0.35–5.50)

## 2011-04-10 LAB — BASIC METABOLIC PANEL
CO2: 27 mEq/L (ref 19–32)
Chloride: 107 mEq/L (ref 96–112)
Creatinine, Ser: 1.3 mg/dL (ref 0.4–1.5)
Potassium: 4.3 mEq/L (ref 3.5–5.1)
Sodium: 142 mEq/L (ref 135–145)

## 2011-04-10 LAB — PSA: PSA: 7.92 ng/mL — ABNORMAL HIGH (ref 0.10–4.00)

## 2011-04-10 NOTE — Progress Notes (Signed)
  Subjective:    Patient ID: Elijah [name], male    DOB: 1941/06/30, 70 y.o.   MRN: [id number]  HPI  Patient Active Problem List  Diagnoses  . HYPERLIPIDEMIA---tolerating meds  . PERIPHERAL NEUROPATHY---might be gradually worsening but does not interfere with any activities. Mostly affects lower extremities  . HYPERTENSION---tolerating meds, no home bps  . ATRIAL FIBRILLATION--no palpitations, no neurologic sxs, has refused anticoagulation  .    No past medical history on file. No past surgical history on file.  reports that he quit smoking about 42 years ago. He does not have any smokeless tobacco history on file. His alcohol and drug histories not on file. family history is not on file. No Known Allergies   Review of Systems     Objective:   Physical Exam        Assessment & Plan:

## 2011-04-10 NOTE — Patient Instructions (Signed)
Call your insurance company and see if they will cover shingles vaccine. If they will, call us and we will give it to you  

## 2011-04-10 NOTE — Assessment & Plan Note (Signed)
Well controlled Continue same meds Check labs today

## 2011-04-10 NOTE — Assessment & Plan Note (Signed)
No symptoms Continue ASA Reviewed previous CV notes

## 2011-04-10 NOTE — Assessment & Plan Note (Signed)
Well controlled Continue same meds 

## 2011-04-15 ENCOUNTER — Encounter: Payer: Self-pay | Admitting: Internal Medicine

## 2011-04-15 ENCOUNTER — Ambulatory Visit (INDEPENDENT_AMBULATORY_CARE_PROVIDER_SITE_OTHER): Payer: Medicare Other | Admitting: Internal Medicine

## 2011-04-15 VITALS — BP 142/80 | HR 64 | Ht 75.0 in | Wt 226.0 lb

## 2011-04-15 DIAGNOSIS — K648 Other hemorrhoids: Secondary | ICD-10-CM

## 2011-04-15 MED ORDER — HYDROCORTISONE 2.5 % RE CREA: TOPICAL_CREAM | Freq: Two times a day (BID) | RECTAL | Status: AC

## 2011-04-15 NOTE — Patient Instructions (Addendum)
Return to see Dr. Leone Payor in 1 month (4-6 weeks). Your prescription(s) has(have) been sent to your pharmacy for you to pick up. Hemorrhoid handout given for you to read.

## 2011-04-16 ENCOUNTER — Other Ambulatory Visit: Payer: Self-pay | Admitting: *Deleted

## 2011-04-16 DIAGNOSIS — K648 Other hemorrhoids: Secondary | ICD-10-CM | POA: Insufficient documentation

## 2011-04-16 MED ORDER — CIPROFLOXACIN HCL 500 MG PO TABS: 500.0000 mg | ORAL_TABLET | Freq: Two times a day (BID) | ORAL | Status: AC

## 2011-04-16 NOTE — Assessment & Plan Note (Signed)
Pattern of bleeding is very consistent with a diagnosis of hemorrhoidal bleeding. I did not have his chart available the time of his visit, at least not completely due to some computer issues. He was originally set up for a direct colonoscopy given 8 years since his last colonoscopy. However he ended up coming for an office visit. The history is entirely consistent with anorectal bleeding. He did try hydrocortisone suppositories but not sure if full trial of these. At this point, after discussing the risks benefits and indications of colonoscopy versus treatment of the hemorrhoids and observation and followup we have chosen the latter. Should he have persistent bleeding, then I think endoscopic evaluation, possible plan to ligate hemorrhoids, will be indicated. I will see him back in a month after he uses hydrocortisone cream instead of suppositories. A hemorrhoid handout was given. He had a normal CBC 5 days ago.

## 2011-04-16 NOTE — Progress Notes (Signed)
  Subjective:    Patient ID: Elijah [name], male    DOB: 12-31-1940, 70 y.o.   MRN: [id number]  HPI This man reports rectal bleeding problems. He described these to his primary care physician or his partner to 3 months ago. He reports bright red blood on the tissue. It also sometimes dripping into the underpants. Because of that he will put a pad in his underpants and he will see some spots of bright red blood at times. There is never been in the colon or in the stool or in the commode. He has no change in bowel habits. No straining to stool, essentially daily and uncomplicated defecation. He feels a little bit bloated and gassy at times. He didn't try some hydrocortisone suppositories couple of months ago it was painful to insert those and took them for a week to 10 days but the bleeding did not resolve. He had a normal CBC on September 6. Colonoscopy in 2004 demonstrated internal and external hemorrhoids. He was originally arranged for direct colonoscopy, which I agreed with based upon history from the chart. I think the patient ended up requesting an office visit.  Review of Systems Some gait issues with neuropathy. Complete review of systems is otherwise negative per our patient survey.    Objective:   Physical Exam General: Well-developed, well-nourished and in no acute distress Vitals: Reviewed and listed above Eyes:anicteric. Neck: supple  Lungs: clear. Heart: S1S2, no rubs, murmurs, gallops. Abdomen: soft, non-tender, no hepatosplenomegaly, hernia, or mass and BS+.  Rectal: small hemorrhoid seen at anal area, otherwise normal anoderm, no rectal mass, brown stool reported. Normal tone. Lymphatics: no cervical, Pillow or inguinal nodes. Extremities:  no edema Skin no rash. Neuro: nonfocal. A&O x 3.  Psych: appropriate mood and  affect.  ANOSCOPY: swollen and engorged hemorrhoids in the anal canal       Assessment & Plan:

## 2011-05-14 ENCOUNTER — Other Ambulatory Visit (INDEPENDENT_AMBULATORY_CARE_PROVIDER_SITE_OTHER): Payer: Medicare Other

## 2011-05-14 DIAGNOSIS — R972 Elevated prostate specific antigen [PSA]: Secondary | ICD-10-CM

## 2011-05-14 LAB — PSA: PSA: 8.47 ng/mL — ABNORMAL HIGH (ref 0.10–4.00)

## 2011-05-21 ENCOUNTER — Other Ambulatory Visit: Payer: Self-pay | Admitting: Internal Medicine

## 2011-05-21 DIAGNOSIS — R972 Elevated prostate specific antigen [PSA]: Secondary | ICD-10-CM

## 2011-05-28 ENCOUNTER — Encounter: Payer: Self-pay | Admitting: Internal Medicine

## 2011-05-28 ENCOUNTER — Ambulatory Visit (INDEPENDENT_AMBULATORY_CARE_PROVIDER_SITE_OTHER): Payer: Medicare Other | Admitting: Internal Medicine

## 2011-05-28 VITALS — BP 142/76 | HR 60 | Ht 75.0 in | Wt 228.0 lb

## 2011-05-28 DIAGNOSIS — K625 Hemorrhage of anus and rectum: Secondary | ICD-10-CM

## 2011-05-28 MED ORDER — PEG-KCL-NACL-NASULF-NA ASC-C 100 G PO SOLR: 1.0000 | Freq: Once | ORAL | Status: DC

## 2011-05-28 NOTE — Patient Instructions (Addendum)
Please obtain a flu shot through your primary care practice or a drug store if you have not done so already. This is appropriate and recommended preventative health measure. You have been scheduled for a Colonoscopy with separate instructions given. You have been given a sample of the MoviPrep kit.

## 2011-05-28 NOTE — Progress Notes (Signed)
  Subjective:    Patient ID: Elijah [name], male    DOB: 1941-04-22, 70 y.o.   MRN: [id number]  HPI Turns after trying hemorrhoidal cream treatment for rectal bleeding thought to be hemorrhoids. The bleeding stopped while he was on the cream, but has returned. He still has intermittent rectal bleeding the very small amounts and not as frequent as before. Stools are formed there is no significant straining to stool he reports. No changes in bowel habits.   Review of Systems His PSA is rising and he is going to see the urologist on November 21.    Objective:   Physical Exam Well-developed well-nourished no acute distress       Assessment & Plan:  This rectal bleeding which is probably still hemorrhoidal, since it has persisted, will be evaluated with a colonoscopy. I did discuss the possibility of hemorrhoidal ligation or other more definitive hemorrhoidal treatment and we have decided not to pursue that at this time. It is thought that having a firmer diagnosis on the rectal bleeding i.e. its cause and extent. Risks benefits and indications a colonoscopy or explained the patient understands and agrees to proceed.

## 2011-06-05 ENCOUNTER — Encounter: Payer: Self-pay | Admitting: Internal Medicine

## 2011-06-05 ENCOUNTER — Ambulatory Visit (AMBULATORY_SURGERY_CENTER): Payer: Medicare Other | Admitting: Internal Medicine

## 2011-06-05 VITALS — BP 155/119 | HR 86 | Temp 97.5°F | Resp 18 | Ht 75.0 in | Wt 228.0 lb

## 2011-06-05 DIAGNOSIS — K635 Polyp of colon: Secondary | ICD-10-CM

## 2011-06-05 DIAGNOSIS — Z860101 Personal history of adenomatous and serrated colon polyps: Secondary | ICD-10-CM

## 2011-06-05 DIAGNOSIS — K625 Hemorrhage of anus and rectum: Secondary | ICD-10-CM

## 2011-06-05 DIAGNOSIS — K649 Unspecified hemorrhoids: Secondary | ICD-10-CM

## 2011-06-05 DIAGNOSIS — Z86010 Personal history of colonic polyps: Secondary | ICD-10-CM

## 2011-06-05 DIAGNOSIS — D126 Benign neoplasm of colon, unspecified: Secondary | ICD-10-CM

## 2011-06-05 HISTORY — DX: Personal history of adenomatous and serrated colon polyps: Z86.0101

## 2011-06-05 HISTORY — DX: Personal history of colonic polyps: Z86.010

## 2011-06-05 MED ORDER — SODIUM CHLORIDE 0.9 % IV SOLN: 500.0000 mL | INTRAVENOUS | Status: DC

## 2011-06-05 NOTE — Patient Instructions (Addendum)
I removed two tiny polyps today. I will let you know about the pathology but they look benign. Mild diverticulosis persists and you still have hemorrhoids which are source of bleeding. Keep using the hemorrhoid cream as needed.If persistent problems then we can discuss other treatment of the hemorrhoids. Iva Boop, MD, [facility] Please refer to the blue and neon green sheets for instructions regarding diet and activity for the rest of today.  Handouts on polyps,diverticulosis and hemorrhoids given. Resume previous medications.

## 2011-06-06 ENCOUNTER — Telehealth: Payer: Self-pay

## 2011-06-06 NOTE — Telephone Encounter (Signed)

## 2011-06-11 NOTE — Progress Notes (Signed)
Quick Note:  Two diminutive adenomas - routine repeat colonoscopy 5 years ______

## 2011-08-06 DIAGNOSIS — C61 Malignant neoplasm of prostate: Secondary | ICD-10-CM | POA: Diagnosis not present

## 2011-08-06 DIAGNOSIS — R972 Elevated prostate specific antigen [PSA]: Secondary | ICD-10-CM | POA: Diagnosis not present

## 2011-08-26 DIAGNOSIS — C61 Malignant neoplasm of prostate: Secondary | ICD-10-CM | POA: Diagnosis not present

## 2011-09-01 ENCOUNTER — Other Ambulatory Visit: Payer: Self-pay | Admitting: Urology

## 2011-09-03 DIAGNOSIS — M6281 Muscle weakness (generalized): Secondary | ICD-10-CM | POA: Diagnosis not present

## 2011-09-03 DIAGNOSIS — C61 Malignant neoplasm of prostate: Secondary | ICD-10-CM | POA: Diagnosis not present

## 2011-09-04 ENCOUNTER — Encounter (HOSPITAL_COMMUNITY): Payer: Self-pay | Admitting: Pharmacy Technician

## 2011-09-10 ENCOUNTER — Other Ambulatory Visit: Payer: Self-pay

## 2011-09-10 ENCOUNTER — Encounter (HOSPITAL_COMMUNITY)
Admission: RE | Admit: 2011-09-10 | Discharge: 2011-09-10 | Disposition: A | Discharge status: Home / Self Care | Payer: Medicare Other | Source: Ambulatory Visit | Attending: Urology | Admitting: Urology

## 2011-09-10 ENCOUNTER — Ambulatory Visit (HOSPITAL_COMMUNITY)
Admission: RE | Admit: 2011-09-10 | Discharge: 2011-09-10 | Disposition: A | Discharge status: Home / Self Care | Payer: Medicare Other | Source: Ambulatory Visit | Attending: Urology | Admitting: Urology

## 2011-09-10 ENCOUNTER — Encounter (HOSPITAL_COMMUNITY): Payer: Self-pay

## 2011-09-10 DIAGNOSIS — I4821 Permanent atrial fibrillation: Secondary | ICD-10-CM

## 2011-09-10 DIAGNOSIS — Z01818 Encounter for other preprocedural examination: Secondary | ICD-10-CM | POA: Diagnosis not present

## 2011-09-10 DIAGNOSIS — R9431 Abnormal electrocardiogram [ECG] [EKG]: Secondary | ICD-10-CM | POA: Diagnosis not present

## 2011-09-10 DIAGNOSIS — Z01810 Encounter for preprocedural cardiovascular examination: Secondary | ICD-10-CM | POA: Insufficient documentation

## 2011-09-10 DIAGNOSIS — G5790 Unspecified mononeuropathy of unspecified lower limb: Secondary | ICD-10-CM

## 2011-09-10 DIAGNOSIS — C801 Malignant (primary) neoplasm, unspecified: Secondary | ICD-10-CM

## 2011-09-10 DIAGNOSIS — Z01811 Encounter for preprocedural respiratory examination: Secondary | ICD-10-CM | POA: Diagnosis not present

## 2011-09-10 DIAGNOSIS — M1990 Unspecified osteoarthritis, unspecified site: Secondary | ICD-10-CM

## 2011-09-10 DIAGNOSIS — Z01812 Encounter for preprocedural laboratory examination: Secondary | ICD-10-CM | POA: Insufficient documentation

## 2011-09-10 HISTORY — DX: Unspecified mononeuropathy of unspecified lower limb: G57.90

## 2011-09-10 HISTORY — PX: EYE SURGERY: SHX253

## 2011-09-10 HISTORY — DX: Malignant (primary) neoplasm, unspecified: C80.1

## 2011-09-10 HISTORY — DX: Unspecified osteoarthritis, unspecified site: M19.90

## 2011-09-10 HISTORY — DX: Permanent atrial fibrillation: I48.21

## 2011-09-10 LAB — CBC
HCT: 47.3 % (ref 39.0–52.0)
Platelets: 200 10*3/uL (ref 150–400)
RDW: 12.8 % (ref 11.5–15.5)
WBC: 7.8 10*3/uL (ref 4.0–10.5)

## 2011-09-10 LAB — BASIC METABOLIC PANEL
BUN: 15 mg/dL (ref 6–23)
Chloride: 104 mEq/L (ref 96–112)
GFR calc Af Amer: 56 mL/min — ABNORMAL LOW (ref >90)
GFR calc non Af Amer: 49 mL/min — ABNORMAL LOW (ref >90)
Potassium: 4.4 mEq/L (ref 3.5–5.1)
Sodium: 139 mEq/L (ref 135–145)

## 2011-09-10 LAB — SURGICAL PCR SCREEN
MRSA, PCR: NEGATIVE
Staphylococcus aureus: NEGATIVE

## 2011-09-10 NOTE — Pre-Procedure Instructions (Signed)
09-10-11 EKG/ CXR done today.

## 2011-09-10 NOTE — Patient Instructions (Signed)
20 Elijah [name]  09/10/2011   Your procedure is scheduled on: 09-15-11  Report to [facility] at     0845   AM.  Call this number if you have problems the morning of surgery: [phone]   Remember:   Do not eat food:After Midnight.  Follow bowel prep and Clear Liquid diet instructions per Dr .Vevelyn Royals office .  Clear liquids include soda, tea, black coffee, apple or grape juice, broth.  Take these medicines the morning of surgery with A SIP OF WATER: Metoprolol.   Do not wear jewelry, make-up or nail polish.  Do not wear lotions, powders, or perfumes. You may wear deodorant.  Do not shave 48 hours prior to surgery.(may shave face, neck only)  Do not bring valuables to the hospital.  Contacts, dentures or bridgework may not be worn into surgery.  Leave suitcase in the car. After surgery it may be brought to your room.  For patients admitted to the hospital, checkout time is 11:00 AM the day of discharge.   Patients discharged the day of surgery will not be allowed to drive home.  Name and phone number of your driver: cor[place]ro, [place] [phone] cell  Special Instructions: CHG Shower Use Special Wash: 1/2 bottle night before surgery and 1/2 bottle morning of surgery.   Please read over the following fact sheets that you were given: MRSA Information, Incentive Spirometry instruction, Blood transfusion fact sheet.

## 2011-09-14 NOTE — H&P (Signed)
Chief Complaint  Prostate Cancer   Reason For Visit  Reason for consult: To discuss treatment options for prostate cancer and specifically to consider a robotic prostatectomy. Physician requesting consult: Dr. Jethro Bolus PCP: Dr. Birdie Sons   History of Present Illness  Elijah [name] is a 3 who was found to have an elevated PSA of 7.92 which prompted a prostate biopsy by Dr. Patsi Sears on 07/23/11.  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 He has no family history of prostate cancer.  He is exploring his options for treatment.  He has a history of atrial fibrillation.  He is not anticoagulated. He had seen Dr. Johney Frame a couple of years ago in consultation and it was recommended to continue with current therapy. He currently uses low dose aspirin and rate control with metoprolol.   TNM stage: cT1c Nx Mx PSA: 7.92 Gleason score: 4+3=7 Biopsy (07/23/11): 6/12 cores positive   Left: L apex (20%, 3+3=6), L lateral mid (30%, 3+3=6), L mid (40%, 4+3=7, PNI), L lateral base (40%, 3+4=7), L base (10%, 4+3=7, PNI)   Right: R lateral mid (5%, 3+3=6) Prostate volume: 41.8 cc  Nomogram: OC disease: 64% EPE: 26% SVI: 13% LNI: 2.8% PFS (surgery): 87%, 84%  Urinary function: He has minimal voiding symptoms including intermittency and a weak stream. His symptoms are not very bothersome. IPSS: 6. Erectile function: He does have mild to moderate erectile dysfunction. SHIM score: 18. He cannot attain erections every time he attempts. He has not been treated with PDE 5 inhibitors.   Past Medical History Problems  1. History of  Arthritis V13.4 2. History of  Atrial Fibrillation 427.31 3. History of  Hypercholesterolemia 272.0 4. History of  Peripheral Neuropathy 356.9  Surgical History Problems  1. History of  No Surgical Problems  Current Meds 1. Adult Aspirin Low Strength 81 MG Oral Tablet Dispersible;  Therapy: (Recorded:21Nov2012) to 2. Metoprolol Succinate ER 50 MG Oral Tablet Extended Release 24 Hour; Therapy:  (Recorded:21Nov2012) to 3. Simvastatin 10 MG Oral Tablet; Therapy: (Recorded:21Nov2012) to  Allergies Medication  1. No Known Drug Allergies  Family History Problems  1. Maternal history of  Death In The Family Mother 80yrs 2. Paternal history of  Family Health Status - Father's Age 72yrs 3. 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    Vitals Vital Signs [Data Includes: Last 1 Day]  22Jan2013 08:55AM  BMI Calculated: 28.27 BSA Calculated: 2.3 Height: 6 ft 3 in Weight: 225 lb  Blood Pressure: 148 / 91 Temperature: 97.5 F Heart Rate: 75  Physical Exam Constitutional: Well nourished and well developed . No acute distress.  ENT:. The ears and nose are normal in appearance.  Neck: The appearance of the neck is normal and no neck mass is present.  Pulmonary: No respiratory distress and normal respiratory rhythm and effort.  Cardiovascular:. No peripheral edema. His heart rate is irregular.  Abdomen: The abdomen is rounded. The abdomen is soft and nontender. No masses are palpated.  No CVA tenderness. No hernias are palpable. No hepatosplenomegaly noted.  Rectal: Rectal exam demonstrates normal sphincter tone, no tenderness and no masses. Prostate size is estimated to be 45 g. The prostate has no nodularity and is not tender. The left seminal vesicle is nonpalpable. The right seminal  vesicle is nonpalpable. The perineum is normal on inspection.  Lymphatics: The femoral and inguinal nodes are not enlarged or tender.  Skin: Normal skin turgor, no visible rash and no visible skin lesions.  Neuro/Psych:. Mood and affect are appropriate.    Results/Data Urine [Data Includes: Last 1 Day]   22Jan2013  COLOR YELLOW   APPEARANCE CLEAR   SPECIFIC GRAVITY 1.015   pH 5.0   GLUCOSE NEG mg/dL  BILIRUBIN NEG   KETONE NEG mg/dL  BLOOD NEG   PROTEIN NEG mg/dL  UROBILINOGEN 0.2 mg/dL  NITRITE NEG   LEUKOCYTE ESTERASE NEG     I have independently reviewed his medical records, pathology report, and PSA results and findings as dictated above.   Assessment  Prostate Cancer 185    Plan  Health Maintenance (V70.0)  1. UA With REFLEX  Done: 22Jan2013 08:48AM Prostate Cancer (185)  2. Follow-up Schedule Surgery Office  Follow-up  Done: 22Jan2013 3. PT Referral Referral  Referral  Requested for: 30Jan2013  Prostate Cancer 185    Discussion/Summary  1. Prostate cancer:   The patient was counseled about the natural history of prostate cancer and the standard treatment options that are available for prostate cancer. It was explained to him how his age and life expectancy, clinical stage, Gleason score, and PSA affect his prognosis, the decision to proceed with additional staging studies, as well as how that information influences recommended treatment strategies. We discussed the roles for active surveillance, radiation therapy, surgical therapy, androgen deprivation, as well as ablative therapy options for the treatment of prostate cancer as appropriate to his individual cancer situation. We discussed the risks and benefits of these options with regard to their impact on cancer control and also in terms of potential adverse events, complications, and impact on quiality of life particularly related to urinary, bowel, and sexual function. The patient was encouraged to ask questions  throughout the discussion today and all questions were answered to his stated satisfaction. In addition, the patient was provided with and/or directed to appropriate resources and literature for further education about prostate cancer and treatment options.   We discussed surgical therapy for prostate cancer including the different available surgical approaches. We discussed, in detail, the risks and expectations of surgery with regard to cancer control, urinary control, and erectile function as well as the expected postoperative recovery process. Additional risks of surgery including but not limited to bleeding, infection, hernia formation, nerve damage, lymphocele formation, bowel/rectal injury potentially necessitating colostomy, damage to the urinary tract resulting in urine leakage, urethral stricture, and the cardiopulmonary risks such as myocardial infarction, stroke, death, venothromboembolism, etc. were explained. The risk of open surgical conversion for robotic/laparoscopic prostatectomy was also discussed.   85 minutes were spent in face to face consultation with patient today. Amended By: Heloise Purpura; 08/26/2011 2:44 PMEST.     We have extensively discussed treatment options and the pros and cons of each approach. I have recommended that he pursue a definitive curative therapy considering his life expectancy and his disease parameters. I offered him and, in fact, recommended he proceed with a radiation oncology consultation. However, Mr. Sachs feels very well informed and adamantly wishes to pursue surgical treatment. I  think this is very reasonable considering his life expectancy and overall health as well as the aggressive nature of his prostate cancer. He will be scheduled for a nerve sparing robotic prostatectomy and pelvic lymphadenectomy. He will undergo a standard nerve sparing procedure on the right side and I will take more care on the left side where the bulk of his disease is  located.  Cc: Dr. Jethro Bolus Dr. Birdie Sons    SignaturesElectronically signed by : Heloise Purpura, M.D.; Aug 26 2011  2:44PM

## 2011-09-15 ENCOUNTER — Other Ambulatory Visit: Payer: Self-pay | Admitting: Urology

## 2011-09-15 ENCOUNTER — Encounter (HOSPITAL_COMMUNITY): Payer: Self-pay

## 2011-09-15 ENCOUNTER — Inpatient Hospital Stay (HOSPITAL_COMMUNITY): Payer: Medicare Other | Admitting: Anesthesiology

## 2011-09-15 ENCOUNTER — Encounter (HOSPITAL_COMMUNITY)
Admission: RE | Disposition: A | Discharge status: Home / Self Care | Payer: Self-pay | Source: Ambulatory Visit | Attending: Urology

## 2011-09-15 ENCOUNTER — Inpatient Hospital Stay (HOSPITAL_COMMUNITY)
Admission: RE | Admit: 2011-09-15 | Discharge: 2011-09-16 | DRG: 708 | Disposition: A | Discharge status: Home / Self Care | Payer: Medicare Other | Source: Ambulatory Visit | Attending: Urology | Admitting: Urology

## 2011-09-15 ENCOUNTER — Encounter (HOSPITAL_COMMUNITY): Payer: Self-pay | Admitting: Anesthesiology

## 2011-09-15 DIAGNOSIS — E7800 Pure hypercholesterolemia, unspecified: Secondary | ICD-10-CM | POA: Diagnosis not present

## 2011-09-15 DIAGNOSIS — Z7982 Long term (current) use of aspirin: Secondary | ICD-10-CM | POA: Diagnosis not present

## 2011-09-15 DIAGNOSIS — M129 Arthropathy, unspecified: Secondary | ICD-10-CM | POA: Diagnosis present

## 2011-09-15 DIAGNOSIS — Z79899 Other long term (current) drug therapy: Secondary | ICD-10-CM | POA: Diagnosis not present

## 2011-09-15 DIAGNOSIS — C61 Malignant neoplasm of prostate: Principal | ICD-10-CM | POA: Diagnosis present

## 2011-09-15 DIAGNOSIS — I4891 Unspecified atrial fibrillation: Secondary | ICD-10-CM | POA: Diagnosis present

## 2011-09-15 DIAGNOSIS — G609 Hereditary and idiopathic neuropathy, unspecified: Secondary | ICD-10-CM | POA: Diagnosis present

## 2011-09-15 DIAGNOSIS — N529 Male erectile dysfunction, unspecified: Secondary | ICD-10-CM | POA: Diagnosis present

## 2011-09-15 HISTORY — PX: ROBOT ASSISTED LAPAROSCOPIC RADICAL PROSTATECTOMY: SHX5141

## 2011-09-15 LAB — TYPE AND SCREEN
ABO/RH(D): O POS
Antibody Screen: NEGATIVE

## 2011-09-15 LAB — HEMOGLOBIN AND HEMATOCRIT, BLOOD
HCT: 45.1 % (ref 39.0–52.0)
Hemoglobin: 15.9 g/dL (ref 13.0–17.0)

## 2011-09-15 LAB — ABO/RH: ABO/RH(D): O POS

## 2011-09-15 SURGERY — ROBOTIC ASSISTED LAPAROSCOPIC RADICAL PROSTATECTOMY LEVEL 2
Anesthesia: General | Site: Abdomen | Wound class: Clean Contaminated

## 2011-09-15 MED ORDER — LIDOCAINE HCL (CARDIAC) 20 MG/ML IV SOLN
INTRAVENOUS | Status: DC | PRN
  Administered 2011-09-15: 30 mg via INTRAVENOUS

## 2011-09-15 MED ORDER — KCL IN DEXTROSE-NACL 20-5-0.45 MEQ/L-%-% IV SOLN
INTRAVENOUS | Status: DC
  Administered 2011-09-15 – 2011-09-16 (×3): via INTRAVENOUS
  Filled 2011-09-15 (×5): qty 1000

## 2011-09-15 MED ORDER — PROPOFOL 10 MG/ML IV EMUL
INTRAVENOUS | Status: DC | PRN
  Administered 2011-09-15: 200 mg via INTRAVENOUS

## 2011-09-15 MED ORDER — NEOSTIGMINE METHYLSULFATE 1 MG/ML IJ SOLN
INTRAMUSCULAR | Status: DC | PRN
  Administered 2011-09-15: 5 mg via INTRAVENOUS

## 2011-09-15 MED ORDER — CEFAZOLIN SODIUM 1-5 GM-% IV SOLN
1.0000 g | Freq: Three times a day (TID) | INTRAVENOUS | Status: AC
  Administered 2011-09-15 – 2011-09-16 (×2): 1 g via INTRAVENOUS
  Filled 2011-09-15 (×2): qty 50

## 2011-09-15 MED ORDER — GLYCOPYRROLATE 0.2 MG/ML IJ SOLN
INTRAMUSCULAR | Status: DC | PRN
  Administered 2011-09-15: .6 mg via INTRAVENOUS

## 2011-09-15 MED ORDER — MIDAZOLAM HCL 5 MG/5ML IJ SOLN
INTRAMUSCULAR | Status: DC | PRN
  Administered 2011-09-15: 2 mg via INTRAVENOUS

## 2011-09-15 MED ORDER — LACTATED RINGERS IV SOLN
INTRAVENOUS | Status: DC | PRN
  Administered 2011-09-15: 11:00:00

## 2011-09-15 MED ORDER — HYDROMORPHONE HCL PF 1 MG/ML IJ SOLN: 0.2500 mg | INTRAMUSCULAR | Status: DC | PRN

## 2011-09-15 MED ORDER — CEFAZOLIN SODIUM-DEXTROSE 2-3 GM-% IV SOLR
2.0000 g | Freq: Once | INTRAVENOUS | Status: AC
  Administered 2011-09-15: 2 g via INTRAVENOUS

## 2011-09-15 MED ORDER — KETOROLAC TROMETHAMINE 15 MG/ML IJ SOLN
15.0000 mg | Freq: Four times a day (QID) | INTRAMUSCULAR | Status: DC
  Administered 2011-09-15 – 2011-09-16 (×5): 15 mg via INTRAVENOUS
  Filled 2011-09-15 (×5): qty 1

## 2011-09-15 MED ORDER — ACETAMINOPHEN 325 MG PO TABS
650.0000 mg | ORAL_TABLET | ORAL | Status: DC | PRN
  Administered 2011-09-16: 650 mg via ORAL
  Filled 2011-09-15: qty 2

## 2011-09-15 MED ORDER — HYDROMORPHONE HCL PF 1 MG/ML IJ SOLN
INTRAMUSCULAR | Status: DC | PRN
  Administered 2011-09-15: .1 mg via INTRAVENOUS
  Administered 2011-09-15 (×4): .2 mg via INTRAVENOUS
  Administered 2011-09-15: .3 mg via INTRAVENOUS
  Administered 2011-09-15: .2 mg via INTRAVENOUS

## 2011-09-15 MED ORDER — ONDANSETRON HCL 4 MG/2ML IJ SOLN
INTRAMUSCULAR | Status: DC | PRN
  Administered 2011-09-15 (×2): 2 mg via INTRAVENOUS

## 2011-09-15 MED ORDER — FENTANYL CITRATE 0.05 MG/ML IJ SOLN
INTRAMUSCULAR | Status: DC | PRN
  Administered 2011-09-15 (×2): 25 ug via INTRAVENOUS
  Administered 2011-09-15 (×4): 50 ug via INTRAVENOUS

## 2011-09-15 MED ORDER — SUCCINYLCHOLINE CHLORIDE 20 MG/ML IJ SOLN
INTRAMUSCULAR | Status: DC | PRN
  Administered 2011-09-15: 150 mg via INTRAVENOUS

## 2011-09-15 MED ORDER — DROPERIDOL 2.5 MG/ML IJ SOLN
INTRAMUSCULAR | Status: DC | PRN
  Administered 2011-09-15: .5 mg via INTRAVENOUS

## 2011-09-15 MED ORDER — KETAMINE HCL 10 MG/ML IJ SOLN
INTRAMUSCULAR | Status: DC | PRN
  Administered 2011-09-15 (×6): 5 mg via INTRAVENOUS

## 2011-09-15 MED ORDER — DIPHENHYDRAMINE HCL 50 MG/ML IJ SOLN: 12.5000 mg | Freq: Four times a day (QID) | INTRAMUSCULAR | Status: DC | PRN

## 2011-09-15 MED ORDER — SODIUM CHLORIDE 0.9 % IR SOLN
Status: DC | PRN
  Administered 2011-09-15: 250 mL

## 2011-09-15 MED ORDER — DIPHENHYDRAMINE HCL 12.5 MG/5ML PO ELIX: 12.5000 mg | ORAL_SOLUTION | Freq: Four times a day (QID) | ORAL | Status: DC | PRN

## 2011-09-15 MED ORDER — SIMVASTATIN 10 MG PO TABS
10.0000 mg | ORAL_TABLET | Freq: Every day | ORAL | Status: DC
  Administered 2011-09-15 (×2): 10 mg via ORAL
  Filled 2011-09-15 (×3): qty 1

## 2011-09-15 MED ORDER — LACTATED RINGERS IV SOLN
INTRAVENOUS | Status: DC
  Administered 2011-09-15: 1000 mL via INTRAVENOUS

## 2011-09-15 MED ORDER — DOCUSATE SODIUM 100 MG PO CAPS
100.0000 mg | ORAL_CAPSULE | Freq: Two times a day (BID) | ORAL | Status: DC
  Administered 2011-09-15 – 2011-09-16 (×2): 100 mg via ORAL
  Filled 2011-09-15 (×5): qty 1

## 2011-09-15 MED ORDER — ASPIRIN 81 MG PO CHEW
81.0000 mg | CHEWABLE_TABLET | Freq: Every day | ORAL | Status: DC
  Filled 2011-09-15 (×3): qty 1

## 2011-09-15 MED ORDER — STERILE WATER FOR IRRIGATION IR SOLN
Status: DC | PRN
  Administered 2011-09-15: 3000 mL

## 2011-09-15 MED ORDER — SODIUM CHLORIDE 0.9 % IV BOLUS (SEPSIS)
1000.0000 mL | Freq: Once | INTRAVENOUS | Status: AC
  Administered 2011-09-15: 1000 mL via INTRAVENOUS

## 2011-09-15 MED ORDER — CIPROFLOXACIN HCL 500 MG PO TABS: 500.0000 mg | ORAL_TABLET | Freq: Two times a day (BID) | ORAL | Status: AC

## 2011-09-15 MED ORDER — MORPHINE SULFATE 2 MG/ML IJ SOLN
2.0000 mg | INTRAMUSCULAR | Status: DC | PRN
  Filled 2011-09-15: qty 1

## 2011-09-15 MED ORDER — METOPROLOL SUCCINATE ER 50 MG PO TB24
50.0000 mg | ORAL_TABLET | Freq: Every day | ORAL | Status: DC
  Administered 2011-09-16: 50 mg via ORAL
  Filled 2011-09-15 (×2): qty 1

## 2011-09-15 MED ORDER — LACTATED RINGERS IV SOLN
INTRAVENOUS | Status: DC | PRN
  Administered 2011-09-15 (×2): via INTRAVENOUS

## 2011-09-15 MED ORDER — HYDROCODONE-ACETAMINOPHEN 5-325 MG PO TABS: 1.0000 | ORAL_TABLET | Freq: Four times a day (QID) | ORAL | Status: AC | PRN

## 2011-09-15 MED ORDER — BUPIVACAINE-EPINEPHRINE 0.25% -1:200000 IJ SOLN
INTRAMUSCULAR | Status: DC | PRN
  Administered 2011-09-15: 27 mL

## 2011-09-15 MED ORDER — PROMETHAZINE HCL 25 MG/ML IJ SOLN: 6.2500 mg | INTRAMUSCULAR | Status: DC | PRN

## 2011-09-15 MED ORDER — ACETAMINOPHEN 10 MG/ML IV SOLN
INTRAVENOUS | Status: DC | PRN
  Administered 2011-09-15: 1000 mg via INTRAVENOUS

## 2011-09-15 MED ORDER — INDIGOTINDISULFONATE SODIUM 8 MG/ML IJ SOLN
INTRAMUSCULAR | Status: DC | PRN
  Administered 2011-09-15 (×2): 40 mg via INTRAVENOUS

## 2011-09-15 MED ORDER — CISATRACURIUM BESYLATE 2 MG/ML IV SOLN
INTRAVENOUS | Status: DC | PRN
  Administered 2011-09-15: 1 mg via INTRAVENOUS
  Administered 2011-09-15: 10 mg via INTRAVENOUS
  Administered 2011-09-15 (×2): 1 mg via INTRAVENOUS
  Administered 2011-09-15: 3 mg via INTRAVENOUS
  Administered 2011-09-15 (×3): 1 mg via INTRAVENOUS

## 2011-09-15 SURGICAL SUPPLY — 35 items
CANISTER SUCTION 2500CC (MISCELLANEOUS) ×3 IMPLANT
CATH ROBINSON RED A/P 8FR (CATHETERS) ×3 IMPLANT
CHLORAPREP W/TINT 26ML (MISCELLANEOUS) ×3 IMPLANT
CLIP LIGATING HEM O LOK PURPLE (MISCELLANEOUS) ×6 IMPLANT
CLOTH BEACON ORANGE TIMEOUT ST (SAFETY) ×3 IMPLANT
CORD HIGH FREQUENCY UNIPOLAR (ELECTROSURGICAL) ×3 IMPLANT
COVER SURGICAL LIGHT HANDLE (MISCELLANEOUS) ×3 IMPLANT
COVER TIP SHEARS 8 DVNC (MISCELLANEOUS) ×2 IMPLANT
COVER TIP SHEARS 8MM DA VINCI (MISCELLANEOUS) ×1
CUTTER ECHEON FLEX ENDO 45 340 (ENDOMECHANICALS) ×3 IMPLANT
DECANTER SPIKE VIAL GLASS SM (MISCELLANEOUS) ×2 IMPLANT
DRAPE SURG IRRIG POUCH 19X23 (DRAPES) ×3 IMPLANT
DRSG TEGADERM 6X8 (GAUZE/BANDAGES/DRESSINGS) ×6 IMPLANT
ELECT REM PT RETURN 9FT ADLT (ELECTROSURGICAL) ×3
ELECTRODE REM PT RTRN 9FT ADLT (ELECTROSURGICAL) ×2 IMPLANT
GLOVE BIO SURGEON STRL SZ 6.5 (GLOVE) ×3 IMPLANT
GLOVE BIOGEL M STRL SZ7.5 (GLOVE) ×6 IMPLANT
GOWN STRL NON-REIN LRG LVL3 (GOWN DISPOSABLE) ×9 IMPLANT
GOWN STRL REIN XL XLG (GOWN DISPOSABLE) ×6 IMPLANT
HOLDER FOLEY CATH W/STRAP (MISCELLANEOUS) ×3 IMPLANT
IV LACTATED RINGERS 1000ML (IV SOLUTION) ×3 IMPLANT
KIT ACCESSORY DA VINCI DISP (KITS) ×1
KIT ACCESSORY DVNC DISP (KITS) ×2 IMPLANT
NDL SAFETY ECLIPSE 18X1.5 (NEEDLE) ×2 IMPLANT
NEEDLE HYPO 18GX1.5 SHARP (NEEDLE) ×3
PACK ROBOT UROLOGY CUSTOM (CUSTOM PROCEDURE TRAY) ×3 IMPLANT
RELOAD GREEN ECHELON 45 (STAPLE) ×3 IMPLANT
SET TUBE IRRIG SUCTION NO TIP (IRRIGATION / IRRIGATOR) ×3 IMPLANT
SOLUTION ELECTROLUBE (MISCELLANEOUS) ×3 IMPLANT
SPONGE GAUZE 4X4 12PLY (GAUZE/BANDAGES/DRESSINGS) ×3 IMPLANT
SUT VICRYL 0 UR6 27IN ABS (SUTURE) ×6 IMPLANT
SYR 27GX1/2 1ML LL SAFETY (SYRINGE) ×3 IMPLANT
TOWEL OR 17X26 10 PK STRL BLUE (TOWEL DISPOSABLE) ×3 IMPLANT
TOWEL OR NON WOVEN STRL DISP B (DISPOSABLE) ×3 IMPLANT
WATER STERILE IRR 1500ML POUR (IV SOLUTION) ×6 IMPLANT

## 2011-09-15 NOTE — Progress Notes (Signed)
Family notified of transfer to 1442. [place] monitor used.

## 2011-09-15 NOTE — Op Note (Signed)
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 Robotic assisted laparoscopic radical prostatectomy (bilateral nerve sparing) 2. Bilateral robotic assisted laparoscopic pelvic lymphadenectomy  Surgeon: [name]. M.D.  Assistant: Pecola Leisure, PA-C  Anesthesia: General  Complications: None  EBL: 100 mL  IVF:  1400 mL crystalloid  Specimens: 1. Prostate and seminal vesicles 2. Right pelvic lymph nodes 3. Left pelvic lymph nodes 4. Periprostatic lymph nodes  Disposition of specimens: Pathology  Drains: 1. 20 Fr coude catheter 2. # 19 Blake pelvic drain  Indication: Elijah [name] is a 71 y.o. year old patient with clinically localized prostate cancer.  After a thorough review of the management options for treatment of prostate cancer, he elected to proceed with surgical therapy and the above procedure(s).  We have discussed the potential benefits and risks of the procedure, side effects of the proposed treatment, the likelihood of the patient achieving the goals of the procedure, and any potential problems that might occur during the procedure or recuperation. Informed consent has been obtained.  Description of procedure:  The patient was taken to the operating room and a general anesthetic was administered. He was given preoperative antibiotics, placed in the dorsal lithotomy position, and prepped and draped in the usual sterile fashion. Next a preoperative timeout was performed. A urethral catheter was placed into the bladder and a site was selected near the umbilicus for placement of the camera port. This was placed using a standard open Hassan technique which allowed entry into the peritoneal cavity under direct vision and without difficulty. A 12 mm port was placed and a pneumoperitoneum  established. The camera was then used to inspect the abdomen and there was no evidence of any intra-abdominal injuries or other abnormalities. The remaining abdominal ports were then placed. 8 mm robotic ports were placed in the right lower quadrant, left lower quadrant, and far left lateral abdominal wall. A 5 mm port was placed in the right upper quadrant and a 12 mm port was placed in the right lateral abdominal wall for laparoscopic assistance. All ports were placed under direct vision without difficulty. The surgical cart was then docked.   Utilizing the cautery scissors, the bladder was reflected posteriorly allowing entry into the space of Retzius and identification of the endopelvic fascia and prostate. The periprostatic fat was then removed from the prostate allowing full exposure of the endopelvic fascia. This tissue was sent for pathologic analysis. The endopelvic fascia was then incised from the apex back to the base of the prostate bilaterally and the underlying levator muscle fibers were swept laterally off the prostate thereby isolating the dorsal venous complex. The dorsal vein was then stapled and divided with a 45 mm Flex Echelon stapler. Attention then turned to the bladder neck which was divided anteriorly thereby allowing entry into the bladder and exposure of the urethral catheter. The catheter balloon was deflated and the catheter was brought into the operative field and used to retract the prostate anteriorly. The posterior bladder neck was then examined and was divided allowing further dissection between the bladder and prostate posteriorly until the vasa deferentia and seminal vessels were identified. The vasa deferentia were isolated, divided, and lifted anteriorly. The seminal vesicles were dissected down to their tips with care to control the seminal vascular arterial blood supply. These structures were then lifted anteriorly and the space between Denonvillier's  fascia and the anterior  rectum was developed with a combination of sharp and blunt dissection. This isolated the vascular pedicles of the prostate.  The lateral prostatic fascia was then sharply incised allowing release of the neurovascular bundles bilaterally. The vascular pedicles of the prostate were then ligated with Weck clips between the prostate and neurovascular bundles and divided with sharp cold scissor dissection resulting in neurovascular bundle preservation. The neurovascular bundles were then separated off the apex of the prostate and urethra bilaterally. On the left side, a partial nerve sparing technique was utilized preserving some of the periprostatic tissue with the specimen.  The urethra was then sharply transected allowing the prostate specimen to be disarticulated. The pelvis was copiously irrigated and hemostasis was ensured. There was no evidence for rectal injury.  Attention then turned to the right pelvic sidewall. The fibrofatty tissue between the external iliac vein, confluence of the iliac vessels, hypogastric artery, and Cooper's ligament was dissected free from the pelvic sidewall with care to preserve the obturator nerve. Weck clips were used for lymphostasis and hemostasis. An identical procedure was performed on the contralateral side and the lymphatic packets were removed for permanent pathologic analysis.  Attention then turned to the urethral anastomosis. A 2-0 Vicryl slip knot was placed between Denonvillier's fascia, the posterior bladder neck, and the posterior urethra to reapproximate these structures. A double-armed 3-0 Monocryl suture was then used to perform a 360 running tension-free anastomosis between the bladder neck and urethra. A new urethral catheter was then placed into the bladder and irrigated. There were no blood clots within the bladder and the anastomosis appeared to be watertight. A #19 Blake drain was then brought through the left lateral 8 mm port site and positioned  appropriately within the pelvis. It was secured to the skin with a nylon suture. The surgical cart was then undocked. The right lateral 12 mm port site was closed at the fascial level with a 0 Vicryl suture placed laparoscopically. All remaining ports were then removed under direct vision. The prostate specimen was removed intact within the Endopouch retrieval bag via the periumbilical camera port site. This fascial opening was closed with two running 0 Vicryl sutures. 0.25% Marcaine was then injected into all port sites and all incisions were reapproximated at the skin level with staples. Sterile dressings were applied. The patient appeared to tolerate the procedure well and without complications. The patient was able to be extubated and transferred to the recovery unit in satisfactory condition.   [name] MD

## 2011-09-15 NOTE — Progress Notes (Signed)
Family notified of transfer to [phone]

## 2011-09-15 NOTE — Anesthesia Preprocedure Evaluation (Signed)
Anesthesia Evaluation    Airway Mallampati: II TM Distance: >3 FB Neck ROM: Full    Dental No notable dental hx.    Pulmonary  clear to auscultation  Pulmonary exam normal       Cardiovascular hypertension, Pt. on medications and Pt. on home beta blockers + dysrhythmias Atrial Fibrillation Regular Normal    Neuro/Psych Peripheral neuropathy  Neuromuscular disease    GI/Hepatic   Endo/Other    Renal/GU      Musculoskeletal   Abdominal   Peds  Hematology   Anesthesia Other Findings   Reproductive/Obstetrics                           Anesthesia Physical Anesthesia Plan  ASA: III  Anesthesia Plan: General   Post-op Pain Management:    Induction: Intravenous  Airway Management Planned: Oral ETT  Additional Equipment:   Intra-op Plan:   Post-operative Plan: Extubation in OR  Informed Consent: I have reviewed the patients History and Physical, chart, labs and discussed the procedure including the risks, benefits and alternatives for the proposed anesthesia with the patient or authorized representative who has indicated his/her understanding and acceptance.   Dental advisory given  Plan Discussed with: CRNA  Anesthesia Plan Comments:         Anesthesia Quick Evaluation

## 2011-09-15 NOTE — Progress Notes (Signed)
Patient ID: Elijah [name], male   DOB: 01/26/1941, 72 y.o.   MRN: [id number]  Post-op note  Subjective: The patient is doing well.  No complaints.  Objective: Vital signs in last 24 hours: Temp:  [97.5 F (36.4 C)-98.2 F (36.8 C)] 97.9 F (36.6 C) (02/11 2046) Pulse Rate:  [82-99] 86  (02/11 2046) Resp:  [12-20] 18  (02/11 2046) BP: (154-178)/(88-130) 165/106 mmHg (02/11 2046) SpO2:  [92 %-100 %] 98 % (02/11 2046) FiO2 (%):  [2 %] 2 % (02/11 1514) Weight:  [106.9 kg (235 lb 10.8 oz)] 106.9 kg (235 lb 10.8 oz) (02/11 1514)  Intake/Output from previous day:   Intake/Output this shift: Total I/O In: -  Out: 40 [Drains:40]  Physical Exam:  General: Alert and oriented. Abdomen: Soft, Nondistended. Incisions: Clean and dry. Urine: Red and small volume  Lab Results:  Basename 09/15/11 1405  HGB 15.9  HCT 45.1    Assessment/Plan: POD#0   1) Continue to monitor 2) Catheter irrigated and no clots present   Elijah [name]. MD   LOS: 0 days   Elijah [name],LES 09/15/2011, 11:28 PM

## 2011-09-15 NOTE — Anesthesia Postprocedure Evaluation (Signed)
  Anesthesia Post-op Note  Patient: Elijah [name]  Procedure(s) Performed:  ROBOTIC ASSISTED LAPAROSCOPIC RADICAL PROSTATECTOMY LEVEL 2 -     ; LYMPHADENECTOMY  Patient Location: PACU  Anesthesia Type: General  Level of Consciousness: awake and alert   Airway and Oxygen Therapy: Patient Spontanous Breathing  Post-op Pain: mild  Post-op Assessment: Post-op Vital signs reviewed, Patient's Cardiovascular Status Stable, Respiratory Function Stable, Patent Airway and No signs of Nausea or vomiting  Post-op Vital Signs: stable  Complications: No apparent anesthesia complications

## 2011-09-15 NOTE — Anesthesia Procedure Notes (Signed)
Procedure Name: Intubation Date/Time: 09/15/2011 10:43 AM Performed by: Valeda Malm Oxygen Delivery Method: Circle System Utilized Preoxygenation: Pre-oxygenation with 100% oxygen Intubation Type: IV induction and Cricoid Pressure applied Ventilation: Mask ventilation without difficulty Laryngoscope Size: Mac and 4 Grade View: Grade I Tube type: Oral Tube size: 8.0 mm Number of attempts: 1 Airway Equipment and Method: stylet

## 2011-09-15 NOTE — Interval H&P Note (Signed)
History and Physical Interval Note:  09/15/2011 9:44 AM  Elijah [name]  has presented today for surgery, with the diagnosis of Prostate Cancer  The various methods of treatment have been discussed with the patient and/or family. After consideration of risks, benefits and other options for treatment, the patient has consented to  Procedure(s): ROBOTIC ASSISTED LAPAROSCOPIC RADICAL PROSTATECTOMY LEVEL 2 LYMPHADENECTOMY as a surgical intervention .  The patients' history has been reviewed, patient examined, no change in status, stable for surgery.  Questions were answered to the patient's satisfaction.     Keone Kamer,LES

## 2011-09-15 NOTE — Transfer of Care (Signed)
Immediate Anesthesia Transfer of Care Note  Patient: Elijah [name]  Procedure(s) Performed:  ROBOTIC ASSISTED LAPAROSCOPIC RADICAL PROSTATECTOMY LEVEL 2 -     ; LYMPHADENECTOMY  Patient Location: PACU  Anesthesia Type: General  Level of Consciousness: awake and alert   Airway & Oxygen Therapy: Patient Spontanous Breathing  Post-op Assessment: Report given to PACU RN and Post -op Vital signs reviewed and stable  Post vital signs: Reviewed and stable  Complications: No apparent anesthesia complications

## 2011-09-16 MED ORDER — HYDROCODONE-ACETAMINOPHEN 5-325 MG PO TABS: 1.0000 | ORAL_TABLET | Freq: Four times a day (QID) | ORAL | Status: DC | PRN

## 2011-09-16 MED ORDER — BISACODYL 10 MG RE SUPP
10.0000 mg | Freq: Once | RECTAL | Status: AC
  Administered 2011-09-16: 10 mg via RECTAL
  Filled 2011-09-16: qty 1

## 2011-09-16 NOTE — Progress Notes (Signed)
:  Temp decreased to 99.3 after Tylenol po given. Maeola Harman

## 2011-09-16 NOTE — Discharge Summary (Signed)
  Date of admission: 09/15/2011  Date of discharge: 09/16/2011  Admission diagnosis: Prostate Cancer  Discharge diagnosis: Prostate Cancer  History and Physical: For full details, please see admission history and physical. Briefly, Elijah [name] is a 71 y.o. gentleman with localized prostate cancer.  After discussing management/treatment options, he elected to proceed with surgical treatment.  Hospital Course: Elijah [name] was taken to the operating room on 09/15/2011 and underwent a robotic assisted laparoscopic radical prostatectomy. He tolerated this procedure well and without complications. Postoperatively, he was able to be transferred to a regular hospital room following recovery from anesthesia.  He was able to begin ambulating the night of surgery. He remained hemodynamically stable overnight.  He had excellent urine output with appropriately minimal output from his pelvic drain and his pelvic drain was removed on POD #1.  He was transitioned to oral pain medication, tolerated a clear liquid diet, and had met all discharge criteria and was able to be discharged home later on POD#1.  Laboratory values:  Basename 09/16/11 0343 09/15/11 1405  HGB 14.6 15.9  HCT 42.1 45.1    Disposition: Home  Discharge instruction: He was instructed to be ambulatory but to refrain from heavy lifting, strenuous activity, or driving. He was instructed on urethral catheter care.  Discharge medications:   Medication List  As of 09/16/2011  2:25 PM   START taking these medications         ciprofloxacin 500 MG tablet   Commonly known as: CIPRO   Take 1 tablet (500 mg total) by mouth 2 (two) times daily. Start day prior to office visit for foley removal      HYDROcodone-acetaminophen 5-325 MG per tablet   Commonly known as: NORCO   Take 1-2 tablets by mouth every 6 (six) hours as needed for pain.         CONTINUE taking these medications         aspirin 325 MG tablet      metoprolol succinate 50 MG 24  hr tablet   Commonly known as: TOPROL-XL      simvastatin 20 MG tablet   Commonly known as: ZOCOR         STOP taking these medications         FISH OIL PO      mulitivitamin with minerals Tabs      vitamin E 400 UNIT capsule          Where to get your medications    These are the prescriptions that you need to pick up.   You may get these medications from any pharmacy.         ciprofloxacin 500 MG tablet   HYDROcodone-acetaminophen 5-325 MG per tablet            Followup: He will followup in 1 week for catheter removal and to discuss his surgical pathology results.

## 2011-09-16 NOTE — Progress Notes (Signed)
Patient ID: Elijah [name], male   DOB: 26-Dec-1940, 71 y.o.   MRN: [id number]  1 Day Post-Op Subjective: The patient is doing well.  No nausea or vomiting. Pain is adequately controlled. Catheter draining well overnight.  No clots.  Objective: Vital signs in last 24 hours: Temp:  [97.5 F (36.4 C)-98.3 F (36.8 C)] 98.3 F (36.8 C) (02/12 0558) Pulse Rate:  [67-99] 67  (02/12 0558) Resp:  [12-20] 18  (02/12 0558) BP: (143-178)/(84-130) 143/90 mmHg (02/12 0558) SpO2:  [92 %-100 %] 100 % (02/12 0558) FiO2 (%):  [2 %] 2 % (02/11 1514) Weight:  [106.9 kg (235 lb 10.8 oz)] 106.9 kg (235 lb 10.8 oz) (02/11 1514)  Intake/Output from previous day: 02/11 0701 - 02/12 0700 In: 5985 [P.O.:600; I.V.:4285; IV Piggyback:1100] Out: 1535 [Urine:1020; Drains:265; Blood:250] Intake/Output this shift: Total I/O In: 2302.5 [P.O.:600; I.V.:1652.5; IV Piggyback:50] Out: 960 [Urine:800; Drains:160]  Physical Exam:  General: Alert and oriented. CV: Irregular rhythm, regular rate Lungs: Clear bilaterally. GI: Soft, Nondistended. Incisions: Dressings intact. Urine: Clear Extremities: Nontender, no erythema, no edema.  Lab Results:  Basename 09/16/11 0343 09/15/11 1405  HGB 14.6 15.9  HCT 42.1 45.1      Assessment/Plan: POD# 1 s/p robotic prostatectomy.  1) SL IVF 2) Ambulate, Incentive spirometry 3) Transition to oral pain medication 4) Dulcolax suppository 5) D/C pelvic drain 6) Plan for likely discharge later today   Elijah [name]. MD   LOS: 1 day   Elijah [name],LES 09/16/2011, 6:58 AM

## 2011-09-16 NOTE — Progress Notes (Signed)
D: Temp elevated at 100.0. A:  Called to Pecola Leisure, [place].  R: orders received.  Maeola Harman

## 2011-09-24 DIAGNOSIS — C61 Malignant neoplasm of prostate: Secondary | ICD-10-CM | POA: Diagnosis not present

## 2011-10-06 ENCOUNTER — Encounter (HOSPITAL_COMMUNITY): Payer: Self-pay | Admitting: Urology

## 2011-10-20 DIAGNOSIS — M6281 Muscle weakness (generalized): Secondary | ICD-10-CM | POA: Diagnosis not present

## 2011-10-20 DIAGNOSIS — C61 Malignant neoplasm of prostate: Secondary | ICD-10-CM | POA: Diagnosis not present

## 2011-10-20 DIAGNOSIS — R279 Unspecified lack of coordination: Secondary | ICD-10-CM | POA: Diagnosis not present

## 2011-10-20 DIAGNOSIS — N393 Stress incontinence (female) (male): Secondary | ICD-10-CM | POA: Diagnosis not present

## 2011-11-05 DIAGNOSIS — C61 Malignant neoplasm of prostate: Secondary | ICD-10-CM | POA: Diagnosis not present

## 2011-11-12 DIAGNOSIS — C61 Malignant neoplasm of prostate: Secondary | ICD-10-CM | POA: Diagnosis not present

## 2011-11-19 DIAGNOSIS — R279 Unspecified lack of coordination: Secondary | ICD-10-CM | POA: Diagnosis not present

## 2011-11-19 DIAGNOSIS — N393 Stress incontinence (female) (male): Secondary | ICD-10-CM | POA: Diagnosis not present

## 2011-11-19 DIAGNOSIS — C61 Malignant neoplasm of prostate: Secondary | ICD-10-CM | POA: Diagnosis not present

## 2011-11-19 DIAGNOSIS — M6281 Muscle weakness (generalized): Secondary | ICD-10-CM | POA: Diagnosis not present

## 2011-12-10 DIAGNOSIS — C61 Malignant neoplasm of prostate: Secondary | ICD-10-CM | POA: Diagnosis not present

## 2011-12-10 DIAGNOSIS — N393 Stress incontinence (female) (male): Secondary | ICD-10-CM | POA: Diagnosis not present

## 2011-12-10 DIAGNOSIS — R279 Unspecified lack of coordination: Secondary | ICD-10-CM | POA: Diagnosis not present

## 2011-12-10 DIAGNOSIS — M6281 Muscle weakness (generalized): Secondary | ICD-10-CM | POA: Diagnosis not present

## 2011-12-26 DIAGNOSIS — H109 Unspecified conjunctivitis: Secondary | ICD-10-CM | POA: Diagnosis not present

## 2011-12-26 DIAGNOSIS — H579 Unspecified disorder of eye and adnexa: Secondary | ICD-10-CM | POA: Diagnosis not present

## 2012-03-20 ENCOUNTER — Other Ambulatory Visit: Payer: Self-pay | Admitting: Internal Medicine

## 2012-03-22 NOTE — Telephone Encounter (Signed)
Dr. Swords pt 

## 2012-04-07 DIAGNOSIS — C61 Malignant neoplasm of prostate: Secondary | ICD-10-CM | POA: Diagnosis not present

## 2012-04-14 DIAGNOSIS — N529 Male erectile dysfunction, unspecified: Secondary | ICD-10-CM | POA: Diagnosis not present

## 2012-04-14 DIAGNOSIS — N393 Stress incontinence (female) (male): Secondary | ICD-10-CM | POA: Diagnosis not present

## 2012-04-14 DIAGNOSIS — C61 Malignant neoplasm of prostate: Secondary | ICD-10-CM | POA: Diagnosis not present

## 2012-04-14 DIAGNOSIS — IMO0002 Reserved for concepts with insufficient information to code with codable children: Secondary | ICD-10-CM | POA: Diagnosis not present

## 2012-04-23 ENCOUNTER — Other Ambulatory Visit: Payer: Self-pay | Admitting: Internal Medicine

## 2012-04-29 ENCOUNTER — Other Ambulatory Visit: Payer: Self-pay | Admitting: Internal Medicine

## 2012-09-14 ENCOUNTER — Encounter: Payer: Medicare Other | Admitting: Internal Medicine

## 2012-09-23 ENCOUNTER — Other Ambulatory Visit: Payer: Self-pay | Admitting: Internal Medicine

## 2012-09-23 ENCOUNTER — Ambulatory Visit (INDEPENDENT_AMBULATORY_CARE_PROVIDER_SITE_OTHER)
Admission: RE | Admit: 2012-09-23 | Discharge: 2012-09-23 | Disposition: A | Discharge status: Home / Self Care | Payer: Medicare Other | Source: Ambulatory Visit | Attending: Internal Medicine | Admitting: Internal Medicine

## 2012-09-23 ENCOUNTER — Ambulatory Visit (INDEPENDENT_AMBULATORY_CARE_PROVIDER_SITE_OTHER): Payer: Medicare Other | Admitting: Internal Medicine

## 2012-09-23 ENCOUNTER — Other Ambulatory Visit: Payer: Self-pay | Admitting: *Deleted

## 2012-09-23 ENCOUNTER — Encounter: Payer: Self-pay | Admitting: Internal Medicine

## 2012-09-23 VITALS — BP 146/80 | HR 76 | Temp 99.8°F | Wt 225.0 lb

## 2012-09-23 DIAGNOSIS — Z0000 Encounter for general adult medical examination without abnormal findings: Secondary | ICD-10-CM

## 2012-09-23 DIAGNOSIS — I10 Essential (primary) hypertension: Secondary | ICD-10-CM

## 2012-09-23 DIAGNOSIS — E785 Hyperlipidemia, unspecified: Secondary | ICD-10-CM

## 2012-09-23 DIAGNOSIS — Z8546 Personal history of malignant neoplasm of prostate: Secondary | ICD-10-CM | POA: Diagnosis not present

## 2012-09-23 DIAGNOSIS — I4891 Unspecified atrial fibrillation: Secondary | ICD-10-CM

## 2012-09-23 DIAGNOSIS — R109 Unspecified abdominal pain: Secondary | ICD-10-CM | POA: Diagnosis not present

## 2012-09-23 DIAGNOSIS — R972 Elevated prostate specific antigen [PSA]: Secondary | ICD-10-CM

## 2012-09-23 DIAGNOSIS — R52 Pain, unspecified: Secondary | ICD-10-CM | POA: Diagnosis not present

## 2012-09-23 DIAGNOSIS — R1032 Left lower quadrant pain: Secondary | ICD-10-CM | POA: Insufficient documentation

## 2012-09-23 DIAGNOSIS — N281 Cyst of kidney, acquired: Secondary | ICD-10-CM | POA: Diagnosis not present

## 2012-09-23 LAB — POCT URINALYSIS DIPSTICK
Bilirubin, UA: NEGATIVE
Glucose, UA: NEGATIVE
Leukocytes, UA: NEGATIVE
Nitrite, UA: NEGATIVE
pH, UA: 5

## 2012-09-23 LAB — CBC WITH DIFFERENTIAL/PLATELET
Basophils Relative: 0.3 % (ref 0.0–3.0)
Eosinophils Relative: 0.1 % (ref 0.0–5.0)
HCT: 49.3 % (ref 39.0–52.0)
Lymphs Abs: 0.6 10*3/uL — ABNORMAL LOW (ref 0.7–4.0)
MCV: 92.6 fl (ref 78.0–100.0)
Monocytes Absolute: 0.9 10*3/uL (ref 0.1–1.0)
Neutro Abs: 7 10*3/uL (ref 1.4–7.7)
RBC: 5.32 Mil/uL (ref 4.22–5.81)
WBC: 8.5 10*3/uL (ref 4.5–10.5)

## 2012-09-23 LAB — PSA: PSA: 0.01 ng/mL — ABNORMAL LOW (ref 0.10–4.00)

## 2012-09-23 LAB — BASIC METABOLIC PANEL
BUN: 19 mg/dL (ref 6–23)
Calcium: 9 mg/dL (ref 8.4–10.5)
Creatinine, Ser: 1.5 mg/dL (ref 0.4–1.5)
GFR: 49 mL/min — ABNORMAL LOW (ref >60.00)
Glucose, Bld: 99 mg/dL (ref 70–99)

## 2012-09-23 LAB — HEPATIC FUNCTION PANEL
AST: 25 U/L (ref 0–37)
Albumin: 3.9 g/dL (ref 3.5–5.2)

## 2012-09-23 LAB — LIPID PANEL: HDL: 33.2 mg/dL — ABNORMAL LOW (ref >39.00)

## 2012-09-23 MED ORDER — IOHEXOL 300 MG/ML  SOLN
100.0000 mL | Freq: Once | INTRAMUSCULAR | Status: AC | PRN
  Administered 2012-09-23: 100 mL via INTRAVENOUS

## 2012-09-23 MED ORDER — CIPROFLOXACIN HCL 500 MG PO TABS: 500.0000 mg | ORAL_TABLET | Freq: Two times a day (BID) | ORAL | Status: DC

## 2012-09-23 MED ORDER — METRONIDAZOLE 500 MG PO TABS: 500.0000 mg | ORAL_TABLET | Freq: Three times a day (TID) | ORAL | Status: DC

## 2012-09-23 NOTE — Patient Instructions (Addendum)
This could be diverticulitis.    Labs and abd pelvic ct scan  Now  . Begin antibiotics as directed pending results  Fu next week Dr swords. Contact emergent  care if not getting better in 2 days or  Getting worse.   Diverticulitis A diverticulum is a small pouch or sac on the colon. Diverticulosis is the presence of these diverticula on the colon. Diverticulitis is the irritation (inflammation) or infection of diverticula. CAUSES  The colon and its diverticula contain bacteria. If food particles block the tiny opening to a diverticulum, the bacteria inside can grow and cause an increase in pressure. This leads to infection and inflammation and is called diverticulitis. SYMPTOMS   Abdominal pain and tenderness. Usually, the pain is located on the left side of your abdomen. However, it could be located elsewhere.  Fever.  Bloating.  Feeling sick to your stomach (nausea).  Throwing up (vomiting).  Abnormal stools. DIAGNOSIS  Your caregiver will take a history and perform a physical exam. Since many things can cause abdominal pain, other tests may be necessary. Tests may include:  Blood tests.  Urine tests.  X-ray of the abdomen.  CT scan of the abdomen. Sometimes, surgery is needed to determine if diverticulitis or other conditions are causing your symptoms. TREATMENT  Most of the time, you can be treated without surgery. Treatment includes:  Resting the bowels by only having liquids for a few days. As you improve, you will need to eat a low-fiber diet.  Intravenous (IV) fluids if you are losing body fluids (dehydrated).  Antibiotic medicines that treat infections may be given.  Pain and nausea medicine, if needed.  Surgery if the inflamed diverticulum has burst. HOME CARE INSTRUCTIONS   Try a clear liquid diet (broth, tea, or water for as long as directed by your caregiver). You may then gradually begin a low-fiber diet as tolerated. A low-fiber diet is a diet with less  than 10 grams of fiber. Choose the foods below to reduce fiber in the diet:  White breads, cereals, rice, and pasta.  Cooked fruits and vegetables or soft fresh fruits and vegetables without the skin.  Ground or well-cooked tender beef, ham, veal, lamb, pork, or poultry.  Eggs and seafood.  After your diverticulitis symptoms have improved, your caregiver may put you on a high-fiber diet. A high-fiber diet includes 14 grams of fiber for every 1000 calories consumed. For a standard 2000 calorie diet, you would need 28 grams of fiber. Follow these diet guidelines to help you increase the fiber in your diet. It is important to slowly increase the amount fiber in your diet to avoid gas, constipation, and bloating.  Choose whole-grain breads, cereals, pasta, and brown rice.  Choose fresh fruits and vegetables with the skin on. Do not overcook vegetables because the more vegetables are cooked, the more fiber is lost.  Choose more nuts, seeds, legumes, dried peas, beans, and lentils.  Look for food products that have greater than 3 grams of fiber per serving on the Nutrition Facts label.  Take all medicine as directed by your caregiver.  If your caregiver has given you a follow-up appointment, it is very important that you go. Not going could result in lasting (chronic) or permanent injury, pain, and disability. If there is any problem keeping the appointment, call to reschedule. SEEK MEDICAL CARE IF:   Your pain does not improve.  You have a hard time advancing your diet beyond clear liquids.  Your bowel movements  do not return to normal. SEEK IMMEDIATE MEDICAL CARE IF:   Your pain becomes worse.  You have an oral temperature above 102 F (38.9 C), not controlled by medicine.  You have repeated vomiting.  You have bloody or black, tarry stools.  Symptoms that brought you to your caregiver become worse or are not getting better. MAKE SURE YOU:   Understand these  instructions.  Will watch your condition.  Will get help right away if you are not doing well or get worse. Document Released: 04/30/2005 Document Revised: 10/13/2011 Document Reviewed: 08/26/2010 [facility] Patient Information 2013 [place], [place].

## 2012-09-23 NOTE — Progress Notes (Signed)
Chief Complaint  Patient presents with  . Left side pain  . Sweats  . Nausea  . Gas    HPI:  Patient comes in today for SDA for  new problem evaluation.  pcp na .  He was in his usual state of health until last eveningOnset with eating  Last pm and developed severe left side pain and   Burping   .  No vomiting some nausea some cold sweat ? If back. Radiation Better after 2 asa .  Last BM today Small but formed with large mount gas. Has been burping a whole lot since this onset. Was worried it could be his heart but has no chest pain shortness of breath he does have chronic atrial fib on aspirin only. Denies history of kidney stones bowel obstruction pain like this before. Supposed to go out of town this afternoon to [facility] for 3-4 days. Colonoscopy   in past up-to-date 2 polyps. Does have diverticulosis never diagnosed with diverticulitis Dr  Laverle Patter performed Da vinci  prostatectomy For prostate cancer. In the past year or 2. Residual occasional dribbling incontinence occasional blood no UTI. Has a history of peripheral neuropathy hypertension elevated lipids. Some arthritis.  No other acute change in health. No unusual bleeding. ROS: See pertinent positives and negatives per HPI.  Past Medical History  Diagnosis Date  . Hyperlipidemia   . Hypertension   . Peripheral neuropathy   . Hemorrhoid   . Diverticulosis   . Atrial fibrillation 09-10-11    Chronic A. Fib.-controls with med and Aspirin  . Cancer 09-10-11    dx. Prostate cancer-bx. done 12'12, surgery planned  . Arthritis 09-10-11    rt. hip, knee, hands  . Neuropathy of foot 09-10-11    bilateral ? etiology    Family History  Problem Relation Age of Onset  . Heart disease Father 47    myoc infarction  . Diabetes Mother   . Hypertension Mother   . Diverticulosis Mother   . Colon cancer Neg Hx     History   Social History  . Marital Status: Married    Spouse Name: N/A    Number of Children: 3  . Years of  Education: N/A   Occupational History  . Consultant    Social History Main Topics  . Smoking status: Former Smoker    Quit date: 08/04/1968  . Smokeless tobacco: Never Used  . Alcohol Use: No     Comment: none in 20 yrs  . Drug Use: No  . Sexually Active: Yes   Other Topics Concern  . None   Social History Narrative   Daily caffeine     Outpatient Encounter Prescriptions as of 09/23/2012  Medication Sig Dispense Refill  . aspirin 325 MG tablet Take 162.5 mg by mouth daily.       . metoprolol succinate (TOPROL-XL) 50 MG 24 hr tablet take 1 tablet by mouth once daily  30 tablet  5  . simvastatin (ZOCOR) 20 MG tablet take 1 tablet by mouth once daily as directed  30 tablet  5  . ciprofloxacin (CIPRO) 500 MG tablet Take 1 tablet (500 mg total) by mouth 2 (two) times daily.  20 tablet  0  . metroNIDAZOLE (FLAGYL) 500 MG tablet Take 1 tablet (500 mg total) by mouth 3 (three) times daily.  30 tablet  0  . [DISCONTINUED] metoprolol succinate (TOPROL-XL) 50 MG 24 hr tablet        No facility-administered encounter medications  on file as of 09/23/2012.    EXAM:  BP 146/80  Pulse 76  Temp(Src) 99.8 F (37.7 C) (Oral)  Wt 225 lb (102.059 kg)  BMI 28.12 kg/m2  SpO2 %  Body mass index is 28.12 kg/(m^2).  GENERAL: vitals reviewed and listed above, alert, oriented, appears well hydrated and in no acute distress  but mildly uncomfortable. Gait mildly antalgic.  HEENT: atraumatic, conjunctiva  clear, no obvious abnormalities on inspection of external nose and ears OP : no lesion edema or exudate moist mucous membranes. NECK: no obvious masses on inspection palpation no adenopathy LUNGS: clear to auscultation bilaterally, no wheezes, rales or rhonchi, good air movement  CV: H no gallops or murmurs irregularly irregular pulse in the 78 range, no clubbing cyanosis or  peripheral edema nl cap refill  Abdomen: Bowel sounds are present in all 4 quadrants no obvious organomegaly there is  tenderness in the left lower to mid quadrant. Perhaps voluntary guarding no rebound negative psoas sign. No flank pain is noted MS: moves all extremities without noticeable focal  abnormality PSYCH: pleasant and cooperative, no obvious depression or anxiety  ASSESSMENT AND PLAN:  Discussed the following assessment and plan:  Abdominal pain, left lower quadrant - poss diverticulitis   History of prostate cancer - Plan: CBC with Differential, Basic metabolic panel, Hepatic function panel, CT Abdomen Pelvis W Contrast, CBC with Differential, Basic metabolic panel, Hepatic function panel, POCT urinalysis dipstick, CANCELED: Urinalysis  Other and unspecified hyperlipidemia - Plan: Lipid panel, TSH  Elevated PSA - Plan: PSA  HYPERTENSION  Atrial fibrillation Treat for suspicion of diverticulitis rule out obstructive pattern less likely renal cause. I do not think this is cardiac cause. Risk benefit of evaluation and medications including Cipro begin on antibiotics Cipro and metronidazole pending evaluation labs pending asked about traveling would get full valuation today before deciding to travel to her seeking medical care out of town if needed expect improvement in 48-72 hours either way and he is to follow up is with Dr. Cato Mulligan next week anyway.   -Patient advised to return or notify health care team  if symptoms worsen or persist or new concerns arise.  Patient Instructions  This could be diverticulitis.    Labs and abd pelvic ct scan  Now  . Begin antibiotics as directed pending results  Fu next week Dr swords. Contact emergent  care if not getting better in 2 days or  Getting worse.   Diverticulitis A diverticulum is a small pouch or sac on the colon. Diverticulosis is the presence of these diverticula on the colon. Diverticulitis is the irritation (inflammation) or infection of diverticula. CAUSES  The colon and its diverticula contain bacteria. If food particles block the tiny opening  to a diverticulum, the bacteria inside can grow and cause an increase in pressure. This leads to infection and inflammation and is called diverticulitis. SYMPTOMS   Abdominal pain and tenderness. Usually, the pain is located on the left side of your abdomen. However, it could be located elsewhere.  Fever.  Bloating.  Feeling sick to your stomach (nausea).  Throwing up (vomiting).  Abnormal stools. DIAGNOSIS  Your caregiver will take a history and perform a physical exam. Since many things can cause abdominal pain, other tests may be necessary. Tests may include:  Blood tests.  Urine tests.  X-ray of the abdomen.  CT scan of the abdomen. Sometimes, surgery is needed to determine if diverticulitis or other conditions are causing your symptoms. TREATMENT  Most  of the time, you can be treated without surgery. Treatment includes:  Resting the bowels by only having liquids for a few days. As you improve, you will need to eat a low-fiber diet.  Intravenous (IV) fluids if you are losing body fluids (dehydrated).  Antibiotic medicines that treat infections may be given.  Pain and nausea medicine, if needed.  Surgery if the inflamed diverticulum has burst. HOME CARE INSTRUCTIONS   Try a clear liquid diet (broth, tea, or water for as long as directed by your caregiver). You may then gradually begin a low-fiber diet as tolerated. A low-fiber diet is a diet with less than 10 grams of fiber. Choose the foods below to reduce fiber in the diet:  White breads, cereals, rice, and pasta.  Cooked fruits and vegetables or soft fresh fruits and vegetables without the skin.  Ground or well-cooked tender beef, ham, veal, lamb, pork, or poultry.  Eggs and seafood.  After your diverticulitis symptoms have improved, your caregiver may put you on a high-fiber diet. A high-fiber diet includes 14 grams of fiber for every 1000 calories consumed. For a standard 2000 calorie diet, you would need 28  grams of fiber. Follow these diet guidelines to help you increase the fiber in your diet. It is important to slowly increase the amount fiber in your diet to avoid gas, constipation, and bloating.  Choose whole-grain breads, cereals, pasta, and brown rice.  Choose fresh fruits and vegetables with the skin on. Do not overcook vegetables because the more vegetables are cooked, the more fiber is lost.  Choose more nuts, seeds, legumes, dried peas, beans, and lentils.  Look for food products that have greater than 3 grams of fiber per serving on the Nutrition Facts label.  Take all medicine as directed by your caregiver.  If your caregiver has given you a follow-up appointment, it is very important that you go. Not going could result in lasting (chronic) or permanent injury, pain, and disability. If there is any problem keeping the appointment, call to reschedule. SEEK MEDICAL CARE IF:   Your pain does not improve.  You have a hard time advancing your diet beyond clear liquids.  Your bowel movements do not return to normal. SEEK IMMEDIATE MEDICAL CARE IF:   Your pain becomes worse.  You have an oral temperature above 102 F (38.9 C), not controlled by medicine.  You have repeated vomiting.  You have bloody or black, tarry stools.  Symptoms that brought you to your caregiver become worse or are not getting better. MAKE SURE YOU:   Understand these instructions.  Will watch your condition.  Will get help right away if you are not doing well or get worse. Document Released: 04/30/2005 Document Revised: 10/13/2011 Document Reviewed: 08/26/2010 [facility] Patient Information 2013 [place], [place].      Neta Mends. Jacquan Savas M.D.

## 2012-09-28 ENCOUNTER — Ambulatory Visit (INDEPENDENT_AMBULATORY_CARE_PROVIDER_SITE_OTHER): Payer: Medicare Other | Admitting: Internal Medicine

## 2012-09-28 ENCOUNTER — Encounter: Payer: Self-pay | Admitting: Internal Medicine

## 2012-09-28 VITALS — BP 124/82 | HR 68 | Temp 98.7°F | Ht 75.0 in | Wt 219.0 lb

## 2012-09-28 DIAGNOSIS — G609 Hereditary and idiopathic neuropathy, unspecified: Secondary | ICD-10-CM

## 2012-09-28 DIAGNOSIS — I10 Essential (primary) hypertension: Secondary | ICD-10-CM

## 2012-09-28 DIAGNOSIS — I4891 Unspecified atrial fibrillation: Secondary | ICD-10-CM | POA: Diagnosis not present

## 2012-09-28 DIAGNOSIS — Z8546 Personal history of malignant neoplasm of prostate: Secondary | ICD-10-CM | POA: Diagnosis not present

## 2012-09-28 DIAGNOSIS — E785 Hyperlipidemia, unspecified: Secondary | ICD-10-CM

## 2012-09-28 NOTE — Progress Notes (Signed)
Patient ID: Elijah [name], male   DOB: [birth year], 72 y.o.   MRN: [id number] Patient is here for evaluation. Patient has known atrial fibrillation, history of prostate cancer. He has hyperlipidemia and takes simvastatin. Patient is not anticoagulated except that he does take an aspirin everyday. Patient also has a known peripheral neuropathy. Etiology is unclear.  Past Medical History  Diagnosis Date  . Hyperlipidemia   . Hypertension   . Peripheral neuropathy   . Hemorrhoid   . Diverticulosis   . Atrial fibrillation 09-10-11    Chronic A. Fib.-controls with med and Aspirin  . Cancer 09-10-11    dx. Prostate cancer-bx. done 12'12, surgery planned  . Arthritis 09-10-11    rt. hip, knee, hands  . Neuropathy of foot 09-10-11    bilateral ? etiology    History   Social History  . Marital Status: Married    Spouse Name: N/A    Number of Children: 3  . Years of Education: N/A   Occupational History  . Consultant    Social History Main Topics  . Smoking status: Former Smoker    Quit date: 08/04/1968  . Smokeless tobacco: Never Used  . Alcohol Use: No     Comment: none in 20 yrs  . Drug Use: No  . Sexually Active: Yes   Other Topics Concern  . Not on file   Social History Narrative   Daily caffeine     Past Surgical History  Procedure Laterality Date  . Colonoscopy  04/2003, 06/05/2011    diverticulosis, internal and external hemorrhoids 2004 and 2012, 2 small polyps 2012  . Tonsillectomy    . Eye surgery  09-10-11    Only stitches to close cut, and observation of hematoma from injury  . Robot assisted laparoscopic radical prostatectomy  09/15/2011    Procedure: ROBOTIC ASSISTED LAPAROSCOPIC RADICAL PROSTATECTOMY LEVEL 2;  Surgeon: Crecencio Mc, MD;  Location: [facility];  Service: Urology;  Laterality: N/A;          Family History  Problem Relation Age of Onset  . Heart disease Father 33    myoc infarction  . Diabetes Mother   . Hypertension Mother   . Diverticulosis Mother   .  Colon cancer Neg Hx     Allergies  Allergen Reactions  . Ciprofloxacin Nausea Only  . Flagyl (Metronidazole) Nausea Only    Current Outpatient Prescriptions on File Prior to Visit  Medication Sig Dispense Refill  . aspirin 325 MG tablet Take 162.5 mg by mouth daily.       . metoprolol succinate (TOPROL-XL) 50 MG 24 hr tablet take 1 tablet by mouth once daily  30 tablet  5  . simvastatin (ZOCOR) 20 MG tablet take 1 tablet by mouth once daily as directed  30 tablet  5   No current facility-administered medications on file prior to visit.     patient denies chest pain, shortness of breath, orthopnea. Denies lower extremity edema, abdominal pain, change in appetite, change in bowel movements. Patient denies rashes, musculoskeletal complaints. No other specific complaints in a complete review of systems.   BP 124/82  Pulse 68  Temp(Src) 98.7 F (37.1 C) (Oral)  Ht 6\' 3"  (1.905 m)  Wt 219 lb (99.338 kg)  BMI 27.37 kg/m2  well-developed well-nourished male in no acute distress. HEENT exam atraumatic, normocephalic, neck supple without jugular venous distention. Chest clear to auscultation cardiac exam S1-S2 are irregular. Abdominal exam overweight with bowel sounds, soft and nontender. Extremities no  edema. Neurologic exam is alert with a normal gait.

## 2012-09-28 NOTE — Assessment & Plan Note (Signed)
Well controlled continue meds 

## 2012-09-28 NOTE — Assessment & Plan Note (Signed)
sxs are the same- no worsening Has had evaluation

## 2012-09-28 NOTE — Assessment & Plan Note (Signed)
Much better controlled

## 2012-09-29 NOTE — Assessment & Plan Note (Signed)
-  Rate controlled. ?-Continue current medications. ?

## 2012-10-26 ENCOUNTER — Other Ambulatory Visit: Payer: Self-pay | Admitting: Internal Medicine

## 2012-11-10 DIAGNOSIS — H2589 Other age-related cataract: Secondary | ICD-10-CM | POA: Diagnosis not present

## 2012-11-10 DIAGNOSIS — H04129 Dry eye syndrome of unspecified lacrimal gland: Secondary | ICD-10-CM | POA: Diagnosis not present

## 2013-01-19 DIAGNOSIS — C61 Malignant neoplasm of prostate: Secondary | ICD-10-CM | POA: Diagnosis not present

## 2013-01-26 DIAGNOSIS — N393 Stress incontinence (female) (male): Secondary | ICD-10-CM | POA: Diagnosis not present

## 2013-01-26 DIAGNOSIS — N529 Male erectile dysfunction, unspecified: Secondary | ICD-10-CM | POA: Diagnosis not present

## 2013-01-26 DIAGNOSIS — C61 Malignant neoplasm of prostate: Secondary | ICD-10-CM | POA: Diagnosis not present

## 2013-01-26 DIAGNOSIS — N35919 Unspecified urethral stricture, male, unspecified site: Secondary | ICD-10-CM | POA: Diagnosis not present

## 2013-04-22 ENCOUNTER — Other Ambulatory Visit: Payer: Self-pay | Admitting: Internal Medicine

## 2013-08-08 DIAGNOSIS — C61 Malignant neoplasm of prostate: Secondary | ICD-10-CM | POA: Diagnosis not present

## 2013-08-19 DIAGNOSIS — C61 Malignant neoplasm of prostate: Secondary | ICD-10-CM | POA: Diagnosis not present

## 2013-08-19 DIAGNOSIS — N529 Male erectile dysfunction, unspecified: Secondary | ICD-10-CM | POA: Diagnosis not present

## 2013-08-19 DIAGNOSIS — N393 Stress incontinence (female) (male): Secondary | ICD-10-CM | POA: Diagnosis not present

## 2013-10-11 ENCOUNTER — Encounter: Payer: Medicare Other | Admitting: Internal Medicine

## 2013-10-18 ENCOUNTER — Encounter: Payer: Medicare Other | Admitting: Internal Medicine

## 2013-10-18 ENCOUNTER — Ambulatory Visit (INDEPENDENT_AMBULATORY_CARE_PROVIDER_SITE_OTHER): Payer: Medicare Other | Admitting: Internal Medicine

## 2013-10-18 ENCOUNTER — Encounter: Payer: Self-pay | Admitting: Internal Medicine

## 2013-10-18 VITALS — BP 124/90 | HR 64 | Temp 98.4°F | Ht 75.0 in | Wt 225.0 lb

## 2013-10-18 DIAGNOSIS — I10 Essential (primary) hypertension: Secondary | ICD-10-CM | POA: Diagnosis not present

## 2013-10-18 DIAGNOSIS — Z125 Encounter for screening for malignant neoplasm of prostate: Secondary | ICD-10-CM | POA: Diagnosis not present

## 2013-10-18 DIAGNOSIS — Z8546 Personal history of malignant neoplasm of prostate: Secondary | ICD-10-CM | POA: Diagnosis not present

## 2013-10-18 DIAGNOSIS — E039 Hypothyroidism, unspecified: Secondary | ICD-10-CM

## 2013-10-18 DIAGNOSIS — E785 Hyperlipidemia, unspecified: Secondary | ICD-10-CM

## 2013-10-18 LAB — BASIC METABOLIC PANEL
BUN: 22 mg/dL (ref 6–23)
CALCIUM: 9.1 mg/dL (ref 8.4–10.5)
CO2: 28 meq/L (ref 19–32)
CREATININE: 1.5 mg/dL (ref 0.4–1.5)
Chloride: 106 mEq/L (ref 96–112)
GFR: 49.62 mL/min — AB (ref >60.00)
GLUCOSE: 104 mg/dL — AB (ref 70–99)
Potassium: 4.9 mEq/L (ref 3.5–5.1)
SODIUM: 139 meq/L (ref 135–145)

## 2013-10-18 LAB — POCT URINALYSIS DIPSTICK
BILIRUBIN UA: NEGATIVE
Blood, UA: NEGATIVE
GLUCOSE UA: NEGATIVE
KETONES UA: NEGATIVE
LEUKOCYTES UA: NEGATIVE
Nitrite, UA: NEGATIVE
Spec Grav, UA: 1.025
Urobilinogen, UA: 0.2
pH, UA: 5

## 2013-10-18 LAB — CBC WITH DIFFERENTIAL/PLATELET
BASOS ABS: 0.1 10*3/uL (ref 0.0–0.1)
Basophils Relative: 0.8 % (ref 0.0–3.0)
EOS ABS: 0.1 10*3/uL (ref 0.0–0.7)
Eosinophils Relative: 1.7 % (ref 0.0–5.0)
HCT: 49.4 % (ref 39.0–52.0)
Hemoglobin: 16.6 g/dL (ref 13.0–17.0)
LYMPHS PCT: 13.1 % (ref 12.0–46.0)
Lymphs Abs: 1.1 10*3/uL (ref 0.7–4.0)
MCHC: 33.7 g/dL (ref 30.0–36.0)
MCV: 93.3 fl (ref 78.0–100.0)
MONOS PCT: 8.6 % (ref 3.0–12.0)
Monocytes Absolute: 0.7 10*3/uL (ref 0.1–1.0)
NEUTROS PCT: 75.8 % (ref 43.0–77.0)
Neutro Abs: 6.5 10*3/uL (ref 1.4–7.7)
Platelets: 225 10*3/uL (ref 150.0–400.0)
RBC: 5.29 Mil/uL (ref 4.22–5.81)
RDW: 13.9 % (ref 11.5–14.6)
WBC: 8.6 10*3/uL (ref 4.5–10.5)

## 2013-10-18 LAB — LIPID PANEL
CHOLESTEROL: 139 mg/dL (ref 0–200)
HDL: 33.2 mg/dL — ABNORMAL LOW (ref >39.00)
LDL Cholesterol: 87 mg/dL (ref 0–99)
TRIGLYCERIDES: 95 mg/dL (ref 0.0–149.0)
Total CHOL/HDL Ratio: 4
VLDL: 19 mg/dL (ref 0.0–40.0)

## 2013-10-18 LAB — HEPATIC FUNCTION PANEL
ALBUMIN: 3.7 g/dL (ref 3.5–5.2)
ALK PHOS: 82 U/L (ref 39–117)
ALT: 28 U/L (ref 0–53)
AST: 23 U/L (ref 0–37)
BILIRUBIN DIRECT: 0.1 mg/dL (ref 0.0–0.3)
BILIRUBIN TOTAL: 1 mg/dL (ref 0.3–1.2)
Total Protein: 6.1 g/dL (ref 6.0–8.3)

## 2013-10-18 LAB — TSH: TSH: 1.02 u[IU]/mL (ref 0.35–5.50)

## 2013-10-18 LAB — PSA: PSA: 0.01 ng/mL — ABNORMAL LOW (ref 0.10–4.00)

## 2013-10-18 NOTE — Progress Notes (Signed)
Patient comes in for f/u Has afib- only on ASA- he understands associated risks  htn- tolerating meds  Peripheral neuropathy- unclear etiology  Prostate CA- has regular f/u with urology  Lipids- tolerating meds  Past Medical History  Diagnosis Date  . Hyperlipidemia   . Hypertension   . Peripheral neuropathy   . Hemorrhoid   . Diverticulosis   . Atrial fibrillation 09-10-11    Chronic A. Fib.-controls with med and Aspirin  . Cancer 09-10-11    dx. Prostate cancer-bx. done 12'12, surgery planned  . Arthritis 09-10-11    rt. hip, knee, hands  . Neuropathy of foot 09-10-11    bilateral ? etiology    History   Social History  . Marital Status: Married    Spouse Name: N/A    Number of Children: 3  . Years of Education: N/A   Occupational History  . Consultant    Social History Main Topics  . Smoking status: Former Smoker    Quit date: 08/04/1968  . Smokeless tobacco: Never Used  . Alcohol Use: No     Comment: none in 20 yrs  . Drug Use: No  . Sexual Activity: Yes   Other Topics Concern  . Not on file   Social History Narrative   Daily caffeine     Past Surgical History  Procedure Laterality Date  . Colonoscopy  04/2003, 06/05/2011    diverticulosis, internal and external hemorrhoids 2004 and 2012, 2 small polyps 2012  . Tonsillectomy    . Eye surgery  09-10-11    Only stitches to close cut, and observation of hematoma from injury  . Robot assisted laparoscopic radical prostatectomy  09/15/2011    Procedure: ROBOTIC ASSISTED LAPAROSCOPIC RADICAL PROSTATECTOMY LEVEL 2;  Surgeon: Dutch Gray, MD;  Location: [facility];  Service: Urology;  Laterality: N/A;          Family History  Problem Relation Age of Onset  . Heart disease Father 47    myoc infarction  . Diabetes Mother   . Hypertension Mother   . Diverticulosis Mother   . Colon cancer Neg Hx     Allergies  Allergen Reactions  . Ciprofloxacin Nausea Only  . Flagyl [Metronidazole] Nausea Only    Current  Outpatient Prescriptions on File Prior to Visit  Medication Sig Dispense Refill  . aspirin 325 MG tablet Take 162.5 mg by mouth daily.       . metoprolol succinate (TOPROL-XL) 50 MG 24 hr tablet take 1 tablet by mouth once daily  30 tablet  5   No current facility-administered medications on file prior to visit.     patient denies chest pain, shortness of breath, orthopnea. Denies lower extremity edema, abdominal pain, change in appetite, change in bowel movements. Patient denies rashes, musculoskeletal complaints. No other specific complaints in a complete review of systems.   BP 124/90  Pulse 64  Temp(Src) 98.4 F (36.9 C) (Oral)  Ht 6\' 3"  (1.905 m)  Wt 225 lb (102.059 kg)  BMI 28.12 kg/m2  well-developed well-nourished male in no acute distress. HEENT exam atraumatic, normocephalic, neck supple without jugular venous distention. Chest clear to auscultation cardiac exam S1-S2 are regular. Abdominal exam overweight with bowel sounds, soft and nontender. Extremities no edema. Neurologic exam is alert with a normal gait.

## 2013-10-18 NOTE — Progress Notes (Signed)
Pre visit review using our clinic review tool, if applicable. No additional management support is needed unless otherwise documented below in the visit note. 

## 2013-10-19 ENCOUNTER — Telehealth: Payer: Self-pay | Admitting: Internal Medicine

## 2013-10-19 NOTE — Telephone Encounter (Signed)
Relevant patient education assigned to patient using Emmi. ° °

## 2013-10-26 ENCOUNTER — Other Ambulatory Visit: Payer: Self-pay | Admitting: Internal Medicine

## 2013-11-24 ENCOUNTER — Other Ambulatory Visit: Payer: Self-pay | Admitting: Internal Medicine

## 2014-02-08 DIAGNOSIS — C61 Malignant neoplasm of prostate: Secondary | ICD-10-CM | POA: Diagnosis not present

## 2014-02-14 DIAGNOSIS — C61 Malignant neoplasm of prostate: Secondary | ICD-10-CM | POA: Diagnosis not present

## 2014-04-16 ENCOUNTER — Other Ambulatory Visit: Payer: Self-pay | Admitting: Internal Medicine

## 2014-04-17 ENCOUNTER — Ambulatory Visit (INDEPENDENT_AMBULATORY_CARE_PROVIDER_SITE_OTHER): Payer: Medicare Other | Admitting: Family Medicine

## 2014-04-17 ENCOUNTER — Encounter: Payer: Self-pay | Admitting: Family Medicine

## 2014-04-17 ENCOUNTER — Ambulatory Visit (INDEPENDENT_AMBULATORY_CARE_PROVIDER_SITE_OTHER)
Admission: RE | Admit: 2014-04-17 | Discharge: 2014-04-17 | Disposition: A | Discharge status: Home / Self Care | Payer: Medicare Other | Source: Ambulatory Visit | Attending: Family Medicine | Admitting: Family Medicine

## 2014-04-17 VITALS — BP 132/80 | HR 60 | Temp 98.1°F | Wt 226.0 lb

## 2014-04-17 DIAGNOSIS — M25476 Effusion, unspecified foot: Secondary | ICD-10-CM

## 2014-04-17 DIAGNOSIS — M7989 Other specified soft tissue disorders: Secondary | ICD-10-CM | POA: Diagnosis not present

## 2014-04-17 DIAGNOSIS — M25472 Effusion, left ankle: Secondary | ICD-10-CM

## 2014-04-17 DIAGNOSIS — S99919A Unspecified injury of unspecified ankle, initial encounter: Secondary | ICD-10-CM | POA: Diagnosis not present

## 2014-04-17 DIAGNOSIS — M25473 Effusion, unspecified ankle: Secondary | ICD-10-CM | POA: Diagnosis not present

## 2014-04-17 DIAGNOSIS — S8990XA Unspecified injury of unspecified lower leg, initial encounter: Secondary | ICD-10-CM | POA: Diagnosis not present

## 2014-04-17 NOTE — Patient Instructions (Signed)
Get x-ray today to assess for fracture.   Warm compresses to promote healing 3x a day.   Suggested ankle brace but you decided against.   Could consider PT for ankle afterwards but benefit may be limited given severe neuropathy

## 2014-04-17 NOTE — Progress Notes (Signed)
  [name], MD Phone: [phone]  Subjective:   Elijah [name] is a 73 y.o. year old very pleasant male patient who presents with the following:  Left ankle swelling Patient had his left foot on the first of a set of stairs then turned to the right to reach for something which caused him to lose his balance and he fell to the ground. He did not have any ankle pain but noted left ankle swelling within hours of the fall. He always has some swelling in both legs but this was significant primarily in the ankle. The patient has continued to go on several mile walks after this fall without pain and he walks without pain immediately after the injury. Patient has not tried anything for swelling. Swelling persistent since last week but not worsening.  ROS-Patient denies Swelling or pain in the calf. He has had no recent travel or immobilization or active cancer. No chest pain or shortness of breath.  Past Medical History-atrial fibrillation, hypertension, hyperlipidemia, history prostate cancer status post prostatectomy, severe idiopathic peripheral neuropathyNo history DVT, unilateral calf swelling, superficial veins present, swelling of entire leg, localized tenderness in calf, pitting edema greater in symptomatic leg, paralysis..  Medications- reviewed and updated Current Outpatient Prescriptions  Medication Sig Dispense Refill  . aspirin 325 MG tablet Take 162.5 mg by mouth daily.       . metoprolol succinate (TOPROL-XL) 50 MG 24 hr tablet take 1 tablet by mouth once daily NEEDS OFFICE VISIT  30 tablet  5  . simvastatin (ZOCOR) 20 MG tablet take 1 tablet by mouth once daily  30 tablet  5   No current facility-administered medications for this visit.    Objective: BP 132/80  Pulse 60  Temp(Src) 98.1 F (36.7 C)  Wt 226 lb (102.513 kg) Gen: NAD, resting comfortably  Skin: warm, dry, no rash Ankle/ankle/msk/extremities: 2+ edema bilateral legs equal in size 10 cm below tibial plateau,  left ankle is visibly swollen compared to right but no erythema Exam limited by severe neuropathy as patient cannot feel my touch until above medial malleolus Range of motion is full in all directions. Strength is 5/5 in all directions. Stable lateral and medial ligaments; squeeze test and kleiger test unremarkable; No pain at base of 5th MT; No tenderness over cuboid; No tenderness over navicular prominence No tenderness on posterior aspects of lateral and medial malleolus Able to walk 4 steps easily    Assessment/Plan:  Left ankle swelling, due to severe neuropathy exam is difficult for need of x-rays. Will obtain x-rays to rule out fracture. Think this is most likely a sprain of the ankle. Advised brace but patient refused. Advised warm compresses to promote healing. I think fracture is unlikely given patient's several miles of walking. I doubt DVT as Wells criteria 0   Orders Placed This Encounter  Procedures  . DG Ankle Complete Left    Standing Status: Future     Number of Occurrences: 1     Standing Expiration Date: 06/18/2015    Order Specific Question:  Reason for Exam (SYMPTOM  OR DIAGNOSIS REQUIRED)    Answer:  ankle swelling after fall, bad neuropathy so hard to assess for pain    Order Specific Question:  Preferred imaging location?    Answer:  [facility]

## 2014-06-05 ENCOUNTER — Encounter: Payer: Self-pay | Admitting: Family Medicine

## 2014-06-05 ENCOUNTER — Ambulatory Visit (INDEPENDENT_AMBULATORY_CARE_PROVIDER_SITE_OTHER): Payer: Medicare Other | Admitting: Family Medicine

## 2014-06-05 VITALS — BP 130/78 | HR 60 | Temp 98.3°F | Wt 226.0 lb

## 2014-06-05 DIAGNOSIS — I10 Essential (primary) hypertension: Secondary | ICD-10-CM | POA: Diagnosis not present

## 2014-06-05 DIAGNOSIS — E785 Hyperlipidemia, unspecified: Secondary | ICD-10-CM

## 2014-06-05 DIAGNOSIS — L570 Actinic keratosis: Secondary | ICD-10-CM

## 2014-06-05 DIAGNOSIS — I482 Chronic atrial fibrillation, unspecified: Secondary | ICD-10-CM

## 2014-06-05 DIAGNOSIS — Z23 Encounter for immunization: Secondary | ICD-10-CM | POA: Diagnosis not present

## 2014-06-05 NOTE — Patient Instructions (Addendum)
Follow up 6-8 months for annual physical (schedule as 30 minute visit even though technically not a physical)    Health Maintenance Due  Topic Date Due  . ZOSTAVAX/shingles - declined 07/06/2001  . TETANUS/TDAP -Td today 04/04/2013  . INFLUENZA VACCINE -declined, ask again next year, make sure to postpone 03/04/2014

## 2014-06-05 NOTE — Assessment & Plan Note (Signed)
Continue simvastatin as well controlled. Recheck lipids in 6 months

## 2014-06-05 NOTE — Assessment & Plan Note (Signed)
Well-controlled. Continued metoprolol.

## 2014-06-05 NOTE — Assessment & Plan Note (Signed)
chads 2 score of 1. Continue aspirin alone. We discussed when he gets to age 73 we could definitely consider with a chads 2 score of 2 considering warfarin or NOAC. Continue metoprolol for rate control. He is in atrial fibrillation at this time

## 2014-06-05 NOTE — Assessment & Plan Note (Signed)
Cryotherapy 3 completed today. 2 of these lesions have been frozen before on his right arm and neck to his left eye and will need to keep a close eye on them

## 2014-06-05 NOTE — Progress Notes (Signed)
Elijah Reddish, MD Phone: [phone]  Subjective:  Patient presents today to establish care with me as their new primary care provider. Patient was formerly a patient of Dr. Leanne Chang. Chief complaint-noted.   Atrial fibrillation-well controlled Follows up with primary care only. He has done well for several years on metoprolol for rate control. He is on aspirin along with a chads 2 score of 1 and has never had a thromboembolic event. ROS-no chest pain or shortness of breath, no dizziness.  Hypertension-Well-controlled on metoprolol alone  BP Readings from Last 3 Encounters:  06/05/14 130/78  04/17/14 132/80  10/18/13 124/90   Home BP monitoring-no Compliant with medications-yes without side effects, metoprolol ROS-Denies any CP, HA, SOB, blurry vision.   Hyperlipidemia-well-controlled  Lab Results  Component Value Date   LDLCALC 87 10/18/2013  On statin: simvastatin 20 mg and has done well on this for several years ROS- no chest pain or shortness of breath. No myalgias   The following were reviewed and entered/updated in epic: Past Medical History  Diagnosis Date  . Hyperlipidemia   . Hypertension   . Peripheral neuropathy   . Hemorrhoid   . Diverticulosis   . Atrial fibrillation 09-10-11    Chronic A. Fib.-controls with med and Aspirin  . Cancer 09-10-11    dx. Prostate cancer-bx. done 12'12, surgery planned  . Arthritis 09-10-11    rt. hip, knee, hands  . Neuropathy of foot 09-10-11    bilateral ? etiology   Patient Active Problem List   Diagnosis Date Noted  . History of prostate cancer 09/23/2012  . PERIPHERAL NEUROPATHY 04/07/2007  . HYPERTENSION 04/07/2007  . HYPERLIPIDEMIA 04/06/2007  . ATRIAL FIBRILLATION 04/06/2007   Past Surgical History  Procedure Laterality Date  . Colonoscopy  04/2003, 06/05/2011    diverticulosis, internal and external hemorrhoids 2004 and 2012, 2 small polyps 2012  . Tonsillectomy    . Eye surgery  09-10-11    Only stitches to close  cut, and observation of hematoma from injury  . Robot assisted laparoscopic radical prostatectomy  09/15/2011    Procedure: ROBOTIC ASSISTED LAPAROSCOPIC RADICAL PROSTATECTOMY LEVEL 2;  Surgeon: Dutch Gray, MD;  Location: [facility];  Service: Urology;  Laterality: N/A;          Family History  Problem Relation Age of Onset  . Heart disease Father 32    myoc infarction  . Diabetes Mother   . Hypertension Mother   . Diverticulosis Mother   . Colon cancer Neg Hx     Medications- reviewed and updated Current Outpatient Prescriptions  Medication Sig Dispense Refill  . aspirin 325 MG tablet Take 162.5 mg by mouth daily.     . metoprolol succinate (TOPROL-XL) 50 MG 24 hr tablet take 1 tablet by mouth once daily NEEDS OFFICE VISIT 30 tablet 5  . simvastatin (ZOCOR) 20 MG tablet take 1 tablet by mouth once daily 30 tablet 5   No current facility-administered medications for this visit.    Allergies-reviewed and updated Allergies  Allergen Reactions  . Ciprofloxacin Nausea Only  . Flagyl [Metronidazole] Nausea Only    History   Social History  . Marital Status: Married    Spouse Name: N/A    Number of Children: 3  . Years of Education: N/A   Occupational History  . Consultant    Social History Main Topics  . Smoking status: Former Smoker    Quit date: 08/04/1968  . Smokeless tobacco: Never Used  . Alcohol Use: No  Comment: none in 20 yrs  . Drug Use: No  . Sexual Activity: Yes   Other Topics Concern  . None   Social History Narrative   Daily caffeine     ROS--See HPI   Objective: BP 130/78 mmHg  Pulse 60  Temp(Src) 98.3 F (36.8 C)  Wt 226 lb (102.513 kg) Gen: NAD, resting comfortably in chair, moves easily to table HEENT: TM normal.  Oropharynx normal CV: irregularly irregular,  no murmurs rubs or gallops Lungs: CTAB no crackles, wheeze, rhonchi Abdomen: soft/nontender/nondistended/normal bowel sounds.  Ext: 1+ edema bilaterally Skin: warm, dry, no  rash. Does have 3 areas suspicious for actinic keratosis on right arm, next to left eye, and on top of head Neuro: grossly normal, moves all extremities   Assessment/Plan:  Atrial fibrillation chads 2 score of 1. Continue aspirin alone. We discussed when he gets to age 39 we could definitely consider with a chads 2 score of 2 considering warfarin or NOAC. Continue metoprolol for rate control. He is in atrial fibrillation at this time  Hyperlipidemia Continue simvastatin as well controlled. Recheck lipids in 6 months  Essential hypertension Well-controlled. Continued metoprolol.  Actinic keratosis Cryotherapy 3 completed today. 2 of these lesions have been frozen before on his right arm and neck to his left eye and will need to keep a close eye on them   Return precautions advised.   Orders Placed This Encounter  Procedures  . Tetanus vaccine IM

## 2014-06-20 ENCOUNTER — Encounter: Payer: Self-pay | Admitting: Family Medicine

## 2014-06-20 ENCOUNTER — Ambulatory Visit (INDEPENDENT_AMBULATORY_CARE_PROVIDER_SITE_OTHER): Payer: Medicare Other | Admitting: Family Medicine

## 2014-06-20 VITALS — BP 130/84 | HR 58 | Temp 98.1°F | Ht 75.0 in | Wt 222.3 lb

## 2014-06-20 DIAGNOSIS — R040 Epistaxis: Secondary | ICD-10-CM | POA: Diagnosis not present

## 2014-06-20 NOTE — Progress Notes (Addendum)
HPI:  Nose bleed: -had a nose bleed during the night a few nights ago -he drove around to urgent care, called our number with no response, finally went to ED - but stopped bleeding while waiting -bled a little this morning - stopped immediately with a little pressure  -R sided both times -takes 1/2 of 325 asa chronically for his A. Fib and reports saw cards remotely and they advised this was fine -he has had some sinus PND issues lately and did not hook up his humidifier yet and has had dry nasal membranes -no bruising or bleeding otherwise -denies: CP, SOB, palpitations, dizziness   ROS: See pertinent positives and negatives per HPI.  Past Medical History  Diagnosis Date  . Hyperlipidemia   . Hypertension   . Peripheral neuropathy   . Hemorrhoid   . Diverticulosis   . Atrial fibrillation 09-10-11    Chronic A. Fib.-controls with med and Aspirin  . Cancer 09-10-11    dx. Prostate cancer-bx. done 12'12, surgery planned  . Arthritis 09-10-11    rt. hip, knee, hands  . Neuropathy of foot 09-10-11    bilateral ? etiology    Past Surgical History  Procedure Laterality Date  . Colonoscopy  04/2003, 06/05/2011    diverticulosis, internal and external hemorrhoids 2004 and 2012, 2 small polyps 2012  . Tonsillectomy    . Eye surgery  09-10-11    Only stitches to close cut, and observation of hematoma from injury  . Robot assisted laparoscopic radical prostatectomy  09/15/2011    Procedure: ROBOTIC ASSISTED LAPAROSCOPIC RADICAL PROSTATECTOMY LEVEL 2;  Surgeon: Dutch Gray, MD;  Location: [facility];  Service: Urology;  Laterality: N/A;          Family History  Problem Relation Age of Onset  . Heart disease Father 44    myoc infarction  . Diabetes Mother   . Hypertension Mother   . Diverticulosis Mother   . Colon cancer Neg Hx     History   Social History  . Marital Status: Married    Spouse Name: N/A    Number of Children: 3  . Years of Education: N/A   Occupational History  .  Consultant    Social History Main Topics  . Smoking status: Former Smoker    Quit date: 08/04/1968  . Smokeless tobacco: Never Used  . Alcohol Use: No     Comment: none in 20 yrs  . Drug Use: No  . Sexual Activity: Yes   Other Topics Concern  . None   Social History Narrative   Married with 3 kids. 6 grandkids.       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       Hobbies: beach (place at [employer]) usually every 2 weeks.     Current outpatient prescriptions: aspirin 325 MG tablet, Take 162.5 mg by mouth daily. , Disp: , Rfl: ;  metoprolol succinate (TOPROL-XL) 50 MG 24 hr tablet, take 1 tablet by mouth once daily NEEDS OFFICE VISIT, Disp: 30 tablet, Rfl: 5;  simvastatin (ZOCOR) 20 MG tablet, take 1 tablet by mouth once daily, Disp: 30 tablet, Rfl: 5  EXAM:  Filed Vitals:   06/20/14 1003  BP: 140/98  Pulse: 72  Temp: 98.1 F (36.7 C)    Body mass index is 27.79 kg/(m^2).  GENERAL: vitals reviewed and listed above, alert, oriented, appears well hydrated and in no acute distress  HEENT: atraumatic, conjunttiva clear, no obvious abnormalities on inspection  of external nose and ears, clear nasal congestion and small collection of dried blood/clot ant R nasal septum  NECK: no obvious masses on inspection  LUNGS: clear to auscultation bilaterally, no wheezes, rales or rhonchi, good air movement  CV: HRRR, no peripheral edema  MS: moves all extremities without noticeable abnormality  PSYCH: pleasant and cooperative, no obvious depression or anxiety  ASSESSMENT AND PLAN:  Discussed the following assessment and plan:  Epistaxis  -we discussed possible serious and likely etiologies, workup and treatment, treatment risks and return precautions - area of dry irritated ant septum found on exam with clot formation -after this discussion, Elijah [name] opted for humidifier, topical abx ointment as emollient, avoidence of rubbing or blowing nose, reduced dose  of asa (81mg )but consider cards consult if recurrent bleeding or any serious bleeds, home care and emergency precuations -follow up advised with PCP in 1 month, sooner if needed -of course, we advised Elijah [name]  to return or notify a doctor immediately if symptoms worsen or persist or new concerns arise.  -Patient advised to return or notify a doctor immediately if symptoms worsen or persist or new concerns arise.  Patient Instructions  Nosebleed A nosebleed can be caused by many things, including:  Getting hit hard in the nose.  Infections.  Dry nose.  Colds.  Medicines. Your doctor may do lab testing if you get nosebleeds a lot and the cause is not known. HOME CARE   If your nose was packed with material, keep it there until your doctor takes it out. Put the pack back in your nose if the pack falls out.  Do not blow your nose for 12 hours after the nosebleed.  Sit up and bend forward if your nose starts bleeding again. Pinch the front half of your nose nonstop for 20 minutes.  Put petroleum jelly inside your nose every morning if you have a dry nose.  Use a humidifier to make the air less dry.  Do not take aspirin.  Try not to strain, lift, or bend at the waist for many days after the nosebleed. GET HELP RIGHT AWAY IF:   Nosebleeds keep happening and are hard to stop or control.  You have bleeding or bruises that are not normal on other parts of the body.  You have a fever.  The nosebleeds get worse.  You get lightheaded, feel faint, sweaty, or throw up (vomit) blood. MAKE SURE YOU:   Understand these instructions.  Will watch your condition.  Will get help right away if you are not doing well or get worse. Document Released: 04/29/2008 Document Revised: 10/13/2011 Document Reviewed: 04/29/2008 [facility] Patient Information 2015 [name], [place]. This information is not intended to replace advice given to you by your health care provider. Make sure you discuss any  questions you have with your health care provider.      [name] R.

## 2014-06-20 NOTE — Progress Notes (Signed)
Pre visit review using our clinic review tool, if applicable. No additional management support is needed unless otherwise documented below in the visit note. 

## 2014-06-20 NOTE — Patient Instructions (Signed)
Nosebleed °A nosebleed can be caused by many things, including: °· Getting hit hard in the nose. °· Infections. °· Dry nose. °· Colds. °· Medicines. °Your doctor may do lab testing if you get nosebleeds a lot and the cause is not known. °HOME CARE  °· If your nose was packed with material, keep it there until your doctor takes it out. Put the pack back in your nose if the pack falls out. °· Do not blow your nose for 12 hours after the nosebleed. °· Sit up and bend forward if your nose starts bleeding again. Pinch the front half of your nose nonstop for 20 minutes. °· Put petroleum jelly inside your nose every morning if you have a dry nose. °· Use a humidifier to make the air less dry. °· Do not take aspirin. °· Try not to strain, lift, or bend at the waist for many days after the nosebleed. °GET HELP RIGHT AWAY IF:  °· Nosebleeds keep happening and are hard to stop or control. °· You have bleeding or bruises that are not normal on other parts of the body. °· You have a fever. °· The nosebleeds get worse. °· You get lightheaded, feel faint, sweaty, or throw up (vomit) blood. °MAKE SURE YOU:  °· Understand these instructions. °· Will watch your condition. °· Will get help right away if you are not doing well or get worse. °Document Released: 04/29/2008 Document Revised: 10/13/2011 Document Reviewed: 04/29/2008 °[employer]® Patient Information ©2015 [employer]. This information is not intended to replace advice given to you by your health care provider. Make sure you discuss any questions you have with your health care provider. ° °

## 2014-07-20 ENCOUNTER — Encounter: Payer: Self-pay | Admitting: Family Medicine

## 2014-07-20 ENCOUNTER — Ambulatory Visit (INDEPENDENT_AMBULATORY_CARE_PROVIDER_SITE_OTHER): Payer: Medicare Other | Admitting: Family Medicine

## 2014-07-20 VITALS — BP 130/70 | HR 75 | Temp 98.5°F | Wt 227.0 lb

## 2014-07-20 DIAGNOSIS — I482 Chronic atrial fibrillation, unspecified: Secondary | ICD-10-CM

## 2014-07-20 DIAGNOSIS — M25552 Pain in left hip: Secondary | ICD-10-CM | POA: Diagnosis not present

## 2014-07-20 NOTE — Assessment & Plan Note (Signed)
Possible osteoarthritis. Patient describes pain in left groin in "catching" pattern where he feels like he is going to fall and no constant pain. Worse with walking. Gradually progressive over last 3 months after a fall in september. Due to this pattern, I have referred patient to be evaluated by sports medicine. I am concerned about his labrum potentially.   Patient also has some nonspecific low back pain intermittently. Finally, has some continued left ankle swelling after his fall but pain is difficult to evaluate due to his neuropathy.

## 2014-07-20 NOTE — Progress Notes (Signed)
[name], MD Phone: [phone]  Subjective:   Elijah [name] is a 73 y.o. year old very pleasant male patient who presents with the following:  Atrial fibrillation-stable Patient with Epistaxis- previously on 1/2 325 ASA for about 2 hours which self resolved. He was reduced to 81 mg and advised follow up in 1 month. No further episodes of epistaxis since that time.  ROS-no melena or bright red blood per rectum.  Left hip pain 3 months of symptoms gradually progressive. Seemed to get worse after he had a fall (due to his neuropathy and unknown foot placement). Longer he walks, the more the left hip bothers him. Hurts in left groin. Hip at times will catch and he will have to stop walking. Makes him feel like he is going to fall. Also moderate low back pain with bending over. Hip pain is not constant but only occurs with the "catching" feeling   Left knee also mildly bothersome as he seems to walk differently due to the hip. Only goes up 1 stair at a time sometimes.   Left ankle still mildly swollen from possible injury in September or ? Neuropathy related  ROS-Patient denies chest pain, shortness of breath, dyspnea on exertion, orthopnea, PND, recent immobilization, history DVT, active cancer, unilateral calf swelling (only in ankle), superficial veins present, swelling of entire leg, localized tenderness in calf.   Past Medical History- Patient Active Problem List   Diagnosis Date Noted  . Atrial fibrillation 04/06/2007    Priority: High  . 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 Essential hypertension 04/07/2007    Priority: Medium  . Hyperlipidemia 04/06/2007    Priority: Medium  . Actinic keratosis 06/05/2014    Priority: Low  . History of prostate cancer 09/23/2012    Priority: Low  . Left hip pain 07/20/2014   Medications- reviewed and updated Current Outpatient Prescriptions  Medication Sig Dispense Refill  . aspirin 81 MG  tablet Take 81 mg by mouth daily.    . metoprolol succinate (TOPROL-XL) 50 MG 24 hr tablet take 1 tablet by mouth once daily NEEDS OFFICE VISIT 30 tablet 5  . simvastatin (ZOCOR) 20 MG tablet take 1 tablet by mouth once daily 30 tablet 5   No current facility-administered medications for this visit.    Objective: BP 130/70 mmHg  Pulse 75  Temp(Src) 98.5 F (36.9 C)  Wt 227 lb (102.967 kg) Gen: NAD, resting comfortably MSK Mild Pain with palpation paraspinous muscles Straight leg raise negative  IR and ER of hip normal and does not produce pain No pain over greater trochanter No inguinal hernia detected  Bilateral knee with normal ligament stability.   Pauses as walks on left leg. With turning at one point, has to catch himself with other leg.   5/5 strength in lower extremities  R ankle 25.5 cm and L 27 both with 1+ pitting edema (site of prior injury)  Decreased distal sensation to fine touch with known neuropathy   Assessment/Plan:  Left hip pain Possible osteoarthritis. Patient describes pain in left groin in "catching" pattern where he feels like he is going to fall and no constant pain. Worse with walking. Gradually progressive over last 3 months after a fall in september. Due to this pattern, I have referred patient to be evaluated by sports medicine. I am concerned about his labrum potentially.   Patient also has some nonspecific low back pain intermittently. Finally, has some continued left ankle swelling after  his fall but pain is difficult to evaluate due to his neuropathy.   Atrial fibrillation No further epistaxis on aspirin 81mg  in place due to atrial fibrillation history. Continue asa 81mg .    Return precautions advised.   Orders Placed This Encounter  Procedures  . Ambulatory referral to Sports Medicine    Referral Priority:  Routine    Referral Type:  Consultation    Referred to Provider:  Lyndal Pulley, DO    Number of Visits Requested:  1

## 2014-07-20 NOTE — Assessment & Plan Note (Signed)
No further epistaxis on aspirin 81mg  in place due to atrial fibrillation history. Continue asa 81mg .

## 2014-07-20 NOTE — Patient Instructions (Signed)
Refer you to Dr. Tamala Julian due to the irregular catching and feeling that you are going to fall with your left hip with history of fall several months ago

## 2014-08-09 ENCOUNTER — Ambulatory Visit (INDEPENDENT_AMBULATORY_CARE_PROVIDER_SITE_OTHER): Payer: Medicare Other | Admitting: Family Medicine

## 2014-08-09 ENCOUNTER — Encounter: Payer: Self-pay | Admitting: Family Medicine

## 2014-08-09 ENCOUNTER — Ambulatory Visit (INDEPENDENT_AMBULATORY_CARE_PROVIDER_SITE_OTHER)
Admission: RE | Admit: 2014-08-09 | Discharge: 2014-08-09 | Disposition: A | Discharge status: Home / Self Care | Payer: Medicare Other | Source: Ambulatory Visit | Attending: Family Medicine | Admitting: Family Medicine

## 2014-08-09 VITALS — BP 136/84 | HR 71 | Ht 75.0 in | Wt 232.0 lb

## 2014-08-09 DIAGNOSIS — M25552 Pain in left hip: Secondary | ICD-10-CM

## 2014-08-09 DIAGNOSIS — M160 Bilateral primary osteoarthritis of hip: Secondary | ICD-10-CM | POA: Diagnosis not present

## 2014-08-09 DIAGNOSIS — M5416 Radiculopathy, lumbar region: Secondary | ICD-10-CM | POA: Diagnosis not present

## 2014-08-09 DIAGNOSIS — M1612 Unilateral primary osteoarthritis, left hip: Secondary | ICD-10-CM | POA: Diagnosis not present

## 2014-08-09 DIAGNOSIS — M47816 Spondylosis without myelopathy or radiculopathy, lumbar region: Secondary | ICD-10-CM | POA: Diagnosis not present

## 2014-08-09 NOTE — Assessment & Plan Note (Signed)
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 I do believe that this is likely the primary cause of his pain. We discussed different treatment options and patient elected for conservative therapy. X-rays are still pending but I am guessing will come to see fairly severe arthritis. We also got x-rays back to rule out any radicular symptoms that could be contributing. We discussed over-the-counter medications a can be beneficial and decrease some of the pain and inflammation patient is having. This will be safe with his other medications. We discussed home exercises and patient spent extra time with athletic trainer wearing the proper positioning and technique of these exercises. Patient will try these interventions come back in 3-4 weeks. If patient continues to have difficult he will consider an intra-articular injection under ultrasound guidance for diagnostic as well as therapeutic purposes.

## 2014-08-09 NOTE — Progress Notes (Signed)
  [facility] [address], [place] [address] Phone: [phone] Subjective:    I'm seeing this patient by the request  of:  [name], MD   CC: left hip pain.   [id number] Elijah [name] is a 74 y.o. male coming in with complaint of left hip pain. Patient has had this pain for quite some time but seems to be worsening over the course of the last several months. Patient states the pain is mostly located in his groin and is worse after walking. Patient has actually stopped walking secondary to the pain. Patient states it as a dull throbbing ache that does somewhat respond over-the-counter medications. Patient states that it can sometimes radiate down his leg. This is associated symptoms with back pain. Patient feels like unfortunately the pain is giving worse. Rates the severity of pain a 7 out of 10. States that when he changes position at night it can wake him up. Denies any weakness of the lower extremity.     Past medical history, social, surgical and family history all reviewed in electronic medical record.   Review of Systems: No headache, visual changes, nausea, vomiting, diarrhea, constipation, dizziness, abdominal pain, skin rash, fevers, chills, night sweats, weight loss, swollen lymph nodes, body aches, joint swelling, muscle aches, chest pain, shortness of breath, mood changes.   Objective Blood pressure 136/84, pulse 71, height 6\' 3"  (1.905 m), weight 232 lb (105.235 kg), SpO2 99 %.  General: No apparent distress alert and oriented x3 mood and affect normal, dressed appropriately.  HEENT: Pupils equal, extraocular movements intact  Respiratory: Patient's speak in full sentences and does not appear short of breath  Cardiovascular: No lower extremity edema, non tender, no erythema  Skin: Warm dry intact with no signs of infection or rash on extremities or on axial skeleton.  Abdomen: Soft nontender  Neuro: Cranial nerves II through XII  are intact, neurovascularly intact in all extremities with 2+ DTRs and 2+ pulses.  Lymph: No lymphadenopathy of posterior or anterior cervical chain or axillae bilaterally.  Gait normal with good balance and coordination.  MSK:  Non tender with full range of motion and good stability and symmetric strength and tone of shoulders, elbows, wrist,  knee and ankles bilaterally.  Hip: Left ROM IR: 15 Deg, ER: 35 Deg, Flexion: 100 Deg, Extension: 80 Deg, Abduction: 35 Deg, Adduction: 35 Deg Strength IR: 4/5, ER: 4/5, Flexion: 5/5, Extension: 5/5, Abduction: 4/5, Adduction: 5/5 Pelvic alignment unremarkable to inspection and palpation. Standing hip rotation and gait without trendelenburg sign / unsteadiness. Greater trochanter without tenderness to palpation. No tenderness over piriformis and greater trochanter.  pain with FABER and FADIR. No SI joint tenderness and normal minimal SI movement. Contralateral hip shows the patient only has 20 of internal rotation but no pain. Back exam shows the patient does have negative straight leg test. Positive Faber test bilaterally. Patient though is somewhat tender to palpation in the paraspinal musculature of the lumbar spine but diffusely. No radicular symptoms neurovascularly intact distally with mild neuropathy.   Impression and Recommendations:     This case required medical decision making of moderate complexity.

## 2014-08-09 NOTE — Patient Instructions (Signed)
Good to see you Elijah [name] 20 minutes 2 times daily. Usually after activity and before bed. Exercises 3 times a week. Alternate hip and back Take tylenol 650 mg three times a day is the best evidence based medicine we have for arthritis.  Glucosamine sulfate 750mg  twice a day is a supplement that has been shown to help moderate to severe arthritis. Vitamin D 2000 IU daily Fish oil 2 grams daily.  Tumeric 500mg  twice daily.  Capsaicin topically up to four times a day may also help with pain. Cortisone injections are an option if these interventions do not seem to make a difference or need more relief.  It's important that you continue to stay active. Controlling your weight is important.  Consider physical therapy to strengthen muscles around the joint that hurts to take pressure off of the joint itself. Water aerobics and cycling with low resistance are the best two types of exercise for arthritis. Come back and see me in 3-4 weeks.

## 2014-08-09 NOTE — Progress Notes (Signed)
[id number]; 15 additional minutes spent for Therapeutic exercises as stated in above notes.  This included exercises focusing on stretching, strengthening, with significant focus on eccentric aspects.   Proper technique shown and discussed handout in great detail with ATC.  All questions were discussed and answered.

## 2014-08-10 ENCOUNTER — Telehealth: Payer: Self-pay | Admitting: Family Medicine

## 2014-08-10 NOTE — Telephone Encounter (Signed)
Pt called to say that he want to make sure that some of the medicines that Dr Tamala Julian want him to take does not interact with some of the medicines he is already taking.   Dr Tamala Julian would like him to take the following med tylenol, glucosamminesulf, tumeric    Pt would like a call back.

## 2014-08-10 NOTE — Telephone Encounter (Signed)
Reviewed. He may take these medications.

## 2014-08-10 NOTE — Telephone Encounter (Signed)
Please advise 

## 2014-08-11 NOTE — Telephone Encounter (Signed)
Pt.notified

## 2014-08-23 DIAGNOSIS — C61 Malignant neoplasm of prostate: Secondary | ICD-10-CM | POA: Diagnosis not present

## 2014-08-30 DIAGNOSIS — C61 Malignant neoplasm of prostate: Secondary | ICD-10-CM | POA: Diagnosis not present

## 2014-08-30 DIAGNOSIS — N393 Stress incontinence (female) (male): Secondary | ICD-10-CM | POA: Diagnosis not present

## 2014-08-30 DIAGNOSIS — N5201 Erectile dysfunction due to arterial insufficiency: Secondary | ICD-10-CM | POA: Diagnosis not present

## 2014-09-06 ENCOUNTER — Encounter: Payer: Self-pay | Admitting: Family Medicine

## 2014-09-06 ENCOUNTER — Ambulatory Visit (INDEPENDENT_AMBULATORY_CARE_PROVIDER_SITE_OTHER): Payer: Medicare Other | Admitting: Family Medicine

## 2014-09-06 VITALS — BP 132/80 | HR 70 | Ht 75.0 in | Wt 231.0 lb

## 2014-09-06 DIAGNOSIS — M1612 Unilateral primary osteoarthritis, left hip: Secondary | ICD-10-CM

## 2014-09-06 NOTE — Progress Notes (Signed)
Pre visit review using our clinic review tool, if applicable. No additional management support is needed unless otherwise documented below in the visit note. 

## 2014-09-06 NOTE — Patient Instructions (Addendum)
Good to see you.  Ice is your friend Continue to stay active Increase vitamin D to 1000 IU daily Continue other vitamins If working out swimming and biking would be best. Walk on softer surfaces Exercises only 3-4 times a week See me again in 6 weeks.

## 2014-09-06 NOTE — Assessment & Plan Note (Signed)
Discussed with patient again. 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 Patient encouraged to continue the home exercises but to have some rest days. We also discussed continuing the vitamins and doing a more regularly. Patient was continue with conservative therapy. We discussed the possibility of formal physical therapy which patient declined at this time. Patient and will come back and see me again in 6 weeks for further evaluation. Patient has any worsening symptoms we can attempt an intra-articular injection.

## 2014-09-06 NOTE — Progress Notes (Signed)
  [facility] [address] [place], [place] [address] Phone: [phone] Subjective:     CC: left hip pain follow up.   OJJ:[id number] Elijah [name] is a 74 y.o. male coming in with complaint of left hip pain. 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 Patient elected try conservative therapy. We gave patient injections of over-the-counter medications, home exercises, icing protocol we discussed proper shoe choices. Patient states he is approximately 20-30% better. Patient states he did not get all the vitamins and does not take them regularly. Patient has been doing mostly the back exercises more. Notices though that he has had some increasing in strength. Patient is able to take stairs more fluently. Patient denies any new symptoms and states that overall he may be feeling better. X-rays after last exam shows the patient does have moderate to severe osteophytic changes of the hip.    Past medical history, social, surgical and family history all reviewed in electronic medical record.   Review of Systems: No headache, visual changes, nausea, vomiting, diarrhea, constipation, dizziness, abdominal pain, skin rash, fevers, chills, night sweats, weight loss, swollen lymph nodes, body aches, joint swelling, muscle aches, chest pain, shortness of breath, mood changes.   Objective Blood pressure 132/80, pulse 70, height 6\' 3"  (1.905 m), weight 231 lb (104.781 kg), SpO2 97 %.  General: No apparent distress alert and oriented x3 mood and affect normal, dressed appropriately.  HEENT: Pupils equal, extraocular movements intact  Respiratory: Patient's speak in full sentences and does not appear short of breath  Cardiovascular: No lower extremity edema, non tender, no erythema  Skin: Warm dry intact with no signs of infection or rash on extremities or on axial skeleton.  Abdomen: Soft nontender    Neuro: Cranial nerves II through XII are intact, neurovascularly intact in all extremities with 2+ DTRs and 2+ pulses.  Lymph: No lymphadenopathy of posterior or anterior cervical chain or axillae bilaterally.  Gait normal with good balance and coordination.  MSK:  Non tender with full range of motion and good stability and symmetric strength and tone of shoulders, elbows, wrist,  knee and ankles bilaterally.  Hip: Left ROM IR: 15 Deg, ER: 35 Deg, Flexion: 100 Deg, Extension: 80 Deg, Abduction: 35 Deg, Adduction: 35 Deg Strength IR: 4/5, ER: 4/5, Flexion: 5/5, Extension: 5/5, Abduction: 4/5, Adduction: 5/5 Pelvic alignment unremarkable to inspection and palpation. Standing hip rotation and gait without trendelenburg sign / unsteadiness. Greater trochanter without tenderness to palpation. No tenderness over piriformis and greater trochanter.  pain with FABER and FADIR. No SI joint tenderness and normal minimal SI movement. Contralateral hip shows the patient only has 20 of internal rotation but no pain. Back exam shows the patient does have negative straight leg test. Positive Faber test bilaterally. Less tender than previous exam Mild improvement from previous exam.   Impression and Recommendations:     This case required medical decision making of moderate complexity.

## 2014-10-03 ENCOUNTER — Other Ambulatory Visit: Payer: Self-pay | Admitting: Internal Medicine

## 2014-10-18 ENCOUNTER — Encounter: Payer: Self-pay | Admitting: Family Medicine

## 2014-10-18 ENCOUNTER — Ambulatory Visit (INDEPENDENT_AMBULATORY_CARE_PROVIDER_SITE_OTHER): Payer: Medicare Other | Admitting: Family Medicine

## 2014-10-18 VITALS — BP 130/80 | HR 87 | Ht 73.0 in | Wt 231.0 lb

## 2014-10-18 DIAGNOSIS — M1612 Unilateral primary osteoarthritis, left hip: Secondary | ICD-10-CM

## 2014-10-18 NOTE — Patient Instructions (Signed)
Good to see you Happy [facility] [employer] Ice still and continue the vitamins We can try an injection if you want See me again if you decide to do an injection or worsening symptoms.

## 2014-10-18 NOTE — Progress Notes (Signed)
  [facility] [address] [place], [place] [address] Phone: [phone] Subjective:     CC: left hip pain follow up.   Elijah [name]:[id number] Juergen Hardenbrook is a 74 y.o. male coming in with complaint of left hip pain. 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 Patient elected try conservative therapy. We gave patient suggestion of over-the-counter medications, home exercises, icing protocol we discussed proper shoe choices. Patient did have some noncompliance at last visit. Patient was to start increasing his activity especially doing the exercises on a more regular basis. Patient states overall he is not made any significant improvement but has not worsened either. Patient states he can still do daily activities. Denies any radiation of pain or any numbness or tingling.   Past medical history, social, surgical and family history all reviewed in electronic medical record.   Review of Systems: No headache, visual changes, nausea, vomiting, diarrhea, constipation, dizziness, abdominal pain, skin rash, fevers, chills, night sweats, weight loss, swollen lymph nodes, body aches, joint swelling, muscle aches, chest pain, shortness of breath, mood changes.   Objective Blood pressure 130/80, pulse 87, height 6\' 1"  (1.854 m), weight 231 lb (104.781 kg), SpO2 96 %.  General: No apparent distress alert and oriented x3 mood and affect normal, dressed appropriately.  HEENT: Pupils equal, extraocular movements intact  Respiratory: Patient's speak in full sentences and does not appear short of breath  Cardiovascular: No lower extremity edema, non tender, no erythema  Skin: Warm dry intact with no signs of infection or rash on extremities or on axial skeleton.  Abdomen: Soft nontender  Neuro: Cranial nerves II through XII are intact, neurovascularly intact in all extremities with 2+ DTRs and 2+ pulses.    Lymph: No lymphadenopathy of posterior or anterior cervical chain or axillae bilaterally.  Gait normal with good balance and coordination.  MSK:  Non tender with full range of motion and good stability and symmetric strength and tone of shoulders, elbows, wrist,  knee and ankles bilaterally.  Hip: Left ROM IR: 15 Deg, ER: 35 Deg, Flexion: 100 Deg, Extension: 80 Deg, Abduction: 35 Deg, Adduction: 35 Deg Strength IR: 4/5, ER: 4/5, Flexion: 5/5, Extension: 5/5, Abduction: 4/5, Adduction: 5/5 Pelvic alignment unremarkable to inspection and palpation. Standing hip rotation and gait without trendelenburg sign / unsteadiness. Greater trochanter without tenderness to palpation. No tenderness over piriformis and greater trochanter.  pain with FABER and FADIR. No SI joint tenderness and normal minimal SI movement. Contralateral hip shows the patient only has 20 of internal rotation but no pain. Back exam shows the patient does have negative straight leg test. Positive Faber test bilaterally. Less tender than previous exam Mild improvement from previous exam.   Impression and Recommendations:     This case required medical decision making of moderate complexity.

## 2014-10-18 NOTE — Progress Notes (Signed)
Pre visit review using our clinic review tool, if applicable. No additional management support is needed unless otherwise documented below in the visit note. 

## 2014-10-18 NOTE — Assessment & Plan Note (Signed)
Patient does have severe osteoarthritic changes of the left hip and this will be concerning for some time. We discussed the possibility of injection which patient declined. We discussed the possibility of formal physical therapy which patient declined. Patient at follow-up with me as needed. We discussed that if worsening pain dealing cured measure would be a hip placement.  Spent  25 minutes with patient face-to-face and had greater than 50% of counseling including as described above in assessment and plan.

## 2014-11-08 DIAGNOSIS — H25819 Combined forms of age-related cataract, unspecified eye: Secondary | ICD-10-CM | POA: Diagnosis not present

## 2014-12-06 ENCOUNTER — Encounter: Payer: Self-pay | Admitting: Family Medicine

## 2014-12-06 ENCOUNTER — Ambulatory Visit (INDEPENDENT_AMBULATORY_CARE_PROVIDER_SITE_OTHER): Payer: Medicare Other | Admitting: Family Medicine

## 2014-12-06 VITALS — BP 126/70 | HR 63 | Temp 98.3°F | Wt 231.0 lb

## 2014-12-06 DIAGNOSIS — M1612 Unilateral primary osteoarthritis, left hip: Secondary | ICD-10-CM | POA: Diagnosis not present

## 2014-12-06 DIAGNOSIS — E785 Hyperlipidemia, unspecified: Secondary | ICD-10-CM

## 2014-12-06 DIAGNOSIS — I482 Chronic atrial fibrillation, unspecified: Secondary | ICD-10-CM

## 2014-12-06 DIAGNOSIS — I10 Essential (primary) hypertension: Secondary | ICD-10-CM | POA: Diagnosis not present

## 2014-12-06 LAB — COMPREHENSIVE METABOLIC PANEL
ALBUMIN: 4.1 g/dL (ref 3.5–5.2)
ALT: 21 U/L (ref 0–53)
AST: 18 U/L (ref 0–37)
Alkaline Phosphatase: 81 U/L (ref 39–117)
BUN: 23 mg/dL (ref 6–23)
CALCIUM: 9.8 mg/dL (ref 8.4–10.5)
CHLORIDE: 105 meq/L (ref 96–112)
CO2: 30 meq/L (ref 19–32)
Creatinine, Ser: 1.48 mg/dL (ref 0.40–1.50)
GFR: 49.46 mL/min — ABNORMAL LOW (ref >60.00)
Glucose, Bld: 58 mg/dL — ABNORMAL LOW (ref 70–99)
POTASSIUM: 5.2 meq/L — AB (ref 3.5–5.1)
Sodium: 140 mEq/L (ref 135–145)
TOTAL PROTEIN: 7 g/dL (ref 6.0–8.3)
Total Bilirubin: 0.5 mg/dL (ref 0.2–1.2)

## 2014-12-06 LAB — TSH: TSH: 1.25 u[IU]/mL (ref 0.35–4.50)

## 2014-12-06 LAB — LDL CHOLESTEROL, DIRECT: LDL DIRECT: 89 mg/dL

## 2014-12-06 LAB — CBC
HEMATOCRIT: 48.2 % (ref 39.0–52.0)
HEMOGLOBIN: 16.5 g/dL (ref 13.0–17.0)
MCHC: 34.3 g/dL (ref 30.0–36.0)
MCV: 91.7 fl (ref 78.0–100.0)
Platelets: 221 10*3/uL (ref 150.0–400.0)
RBC: 5.26 Mil/uL (ref 4.22–5.81)
RDW: 14.3 % (ref 11.5–15.5)
WBC: 7.8 10*3/uL (ref 4.0–10.5)

## 2014-12-06 NOTE — Assessment & Plan Note (Signed)
S: asymptomatic. Continues in a fib. Anticoagulation-ASA 81 mg (epistaxis on 1/2 of 325mg ). Rate control with metoprolol (used to run around low 100s).  A/P:Chadsvasc score of 2=recommend coumadin or xarelto. Aware of CVA risk but declines for now, agrees to continue conversation and certainly start if has TIA or CVA. Also would consider when chadsvasc increases to 3 at 75. Continue current rx.

## 2014-12-06 NOTE — Progress Notes (Signed)
Elijah Reddish, MD  Subjective:  Elijah [name] is a 74 y.o. year old very pleasant male patient who presents with:  See problem oriented charting- ROS- no chest pain, palpitations, shortness of breath, headache. No myalgias  Past Medical History- AK in past, HTN, history prostate cancer, neuropathy  Medications- reviewed and updated Current Outpatient Prescriptions  Medication Sig Dispense Refill  . acetaminophen (TYLENOL) 325 MG tablet Take 650 mg by mouth every 6 (six) hours as needed.    Marland Kitchen aspirin 81 MG tablet Take 81 mg by mouth daily.    . Glucosamine-Chondroit-Vit C-Mn (GLUCOSAMINE 1500 COMPLEX PO) Take 2,000 mg by mouth.    . metoprolol succinate (TOPROL-XL) 50 MG 24 hr tablet take 1 tablet by mouth once daily NEEDS OFFICE VISIT 30 tablet 5  . simvastatin (ZOCOR) 20 MG tablet take 1 tablet by mouth once daily (Patient taking differently: take 1/2 tablet by mouth once daily) 30 tablet 5   No current facility-administered medications for this visit.    Objective: BP 126/70 mmHg  Pulse 63  Temp(Src) 98.3 F (36.8 C)  Wt 231 lb (104.781 kg) Gen: NAD, resting comfortably CV: irregularly irregular, no murmurs rubs or gallops Lungs: CTAB no crackles, wheeze, rhonchi Abdomen: soft/nontender/nondistended/normal bowel sounds.  Ext: 1+ bilateral edema Skin: warm, dry, no rash-prior AKs resolved after cryotherapy   Assessment/Plan:  Atrial fibrillation S: asymptomatic. Continues in a fib. Anticoagulation-ASA 81 mg (epistaxis on 1/2 of 325mg ). Rate control with metoprolol (used to run around low 100s).  A/P:Chadsvasc score of 2=recommend coumadin or xarelto. Aware of CVA risk but declines for now, agrees to continue conversation and certainly start if has TIA or CVA. Also would consider when chadsvasc increases to 3 at 75. Continue current rx.     Hyperlipidemia S:  Compliant with simvastatin and previously controlled A/P: check direct LDL as not fasting today. LDL goal  <100    Osteoarthritis of left hip S: eval by SM with consideration of formal PT and steroid injection. Patient declined both but has been using glucosamine and turmeric. He is not sure if either is helping and now only taking glucosamine.  A/P: Patient had some flare in his pain that has eased after easing off of exercises. Asked him to resume once pain subsides. Continue glucosamine and follow up sports medicine. He does not think pain bad enough for injection at present    6 months  nonfasting Orders Placed This Encounter  Procedures  . LDL cholesterol, direct    [place]  . CBC    [place]  . Comprehensive metabolic panel    [place]  . TSH    

## 2014-12-06 NOTE — Patient Instructions (Signed)
nonfasting labs today  We discussed 2.2% per year stroke risk and my recommendation to move from aspirin to xarelto or coumadin. You declined at this time but are aware of stroke risk and we will plan to readdress each visit.   Everything else looks great (except for your hip of course)

## 2014-12-06 NOTE — Assessment & Plan Note (Signed)
S:  Compliant with simvastatin and previously controlled A/P: check direct LDL as not fasting today. LDL goal <100

## 2014-12-06 NOTE — Assessment & Plan Note (Signed)
S: eval by SM with consideration of formal PT and steroid injection. Patient declined both but has been using glucosamine and turmeric. He is not sure if either is helping and now only taking glucosamine.  A/P: Patient had some flare in his pain that has eased after easing off of exercises. Asked him to resume once pain subsides. Continue glucosamine and follow up sports medicine. He does not think pain bad enough for injection at present

## 2014-12-07 ENCOUNTER — Encounter: Payer: Self-pay | Admitting: Family Medicine

## 2014-12-07 DIAGNOSIS — N183 Chronic kidney disease, stage 3 unspecified: Secondary | ICD-10-CM | POA: Insufficient documentation

## 2014-12-12 ENCOUNTER — Telehealth: Payer: Self-pay | Admitting: Family Medicine

## 2014-12-12 NOTE — Telephone Encounter (Signed)
Called pt back and pt states that while he was eating breakfast this morning he got ready to get up and fell forward, he also fell getting out of the shower. Pt declines any leg weakness or dizziness. He states this is the first time this has happened and some co workers of his stated he looked pale to them this morning. Pt really could not tell me how he felt, he states he does not think he passed out he did mention neck soreness but denies headache. Please advise if this should be triaged, schedule appointment for tomorrow or go to ED?Elijah [name]

## 2014-12-12 NOTE — Telephone Encounter (Signed)
Per Dr. [name] please advise pt to either go to the Urgent Care or be seen at 8:15 in the morning.Pt wants to come at 8:15 in the morning, i advised pt if he experiences any new or worsening symptoms to please go to an Urgent Care. Pt verbalized understanding.

## 2014-12-12 NOTE — Telephone Encounter (Signed)
Pt was offerred an appt with another provider tomorrow. Pt request to send a message to dr hunter to see what he should do. Pt fell twice today

## 2014-12-13 ENCOUNTER — Encounter: Payer: Self-pay | Admitting: Family Medicine

## 2014-12-13 ENCOUNTER — Ambulatory Visit (INDEPENDENT_AMBULATORY_CARE_PROVIDER_SITE_OTHER): Payer: Medicare Other | Admitting: Family Medicine

## 2014-12-13 VITALS — BP 104/72 | HR 73 | Temp 99.1°F | Wt 230.0 lb

## 2014-12-13 DIAGNOSIS — I482 Chronic atrial fibrillation, unspecified: Secondary | ICD-10-CM

## 2014-12-13 DIAGNOSIS — R55 Syncope and collapse: Secondary | ICD-10-CM | POA: Diagnosis not present

## 2014-12-13 LAB — CBC
HEMATOCRIT: 48.2 % (ref 39.0–52.0)
Hemoglobin: 16.5 g/dL (ref 13.0–17.0)
MCHC: 34.2 g/dL (ref 30.0–36.0)
MCV: 91.4 fl (ref 78.0–100.0)
Platelets: 212 10*3/uL (ref 150.0–400.0)
RBC: 5.28 Mil/uL (ref 4.22–5.81)
RDW: 13.9 % (ref 11.5–15.5)
WBC: 9.6 10*3/uL (ref 4.0–10.5)

## 2014-12-13 LAB — COMPREHENSIVE METABOLIC PANEL
ALK PHOS: 81 U/L (ref 39–117)
ALT: 21 U/L (ref 0–53)
AST: 20 U/L (ref 0–37)
Albumin: 3.9 g/dL (ref 3.5–5.2)
BUN: 19 mg/dL (ref 6–23)
CO2: 29 mEq/L (ref 19–32)
Calcium: 10 mg/dL (ref 8.4–10.5)
Chloride: 105 mEq/L (ref 96–112)
Creatinine, Ser: 1.47 mg/dL (ref 0.40–1.50)
GFR: 49.85 mL/min — ABNORMAL LOW (ref >60.00)
GLUCOSE: 85 mg/dL (ref 70–99)
Potassium: 5.3 mEq/L — ABNORMAL HIGH (ref 3.5–5.1)
SODIUM: 140 meq/L (ref 135–145)
TOTAL PROTEIN: 6.9 g/dL (ref 6.0–8.3)
Total Bilirubin: 0.5 mg/dL (ref 0.2–1.2)

## 2014-12-13 LAB — TSH: TSH: 1.21 u[IU]/mL (ref 0.35–4.50)

## 2014-12-13 NOTE — Patient Instructions (Signed)
We will call you within a few days about your referral to echocardiogram, MRI, carotid dopplers. If you do not hear by Monday weeks, give [place] a call. Cardiology will call you about your appointment with them, call us if you have not heard by tomorrow.   Labs today before you leave  If you have recurrent episodes of passing out, you need to seek care immediately even if out of town. My suggestion was to stay in town by I understand you have to see your grandson's graduation

## 2014-12-13 NOTE — Progress Notes (Signed)
[name], MD  Subjective:  Elijah [name] is a 74 y.o. year old very pleasant male patient who presents with:  Syncope Fall -Patient in normal state of health yesterday AM. Eating breakfast and dropped newspaper and when he went to pick it up... The next thing he remembers he was being pulled up from the floor. He hit on his left shoulder and side and also hit the left side of his scalp where he still has mild brising. Presumed LOC due to lapse in time between leaning over and then getting up. Wife heard it and came in to help. Then fell 2 more times over the next hour. He fell backwards in his bedroom trying to get onto his bed and fell again on his left side against the nightstand. Remembered this whole event. About a 1/2 hour later he decided to get up and take a shower. Wife tried to peek in and out to make sure he didn't hur himself but once again heard a loud thud as he fell to the ground. He remembers starting to fall again but nothing after that point until getting up.   Of note patient does have known neuropathy with reported "Extensive evaluation" in the past per Dr. Leanne Chang. Minor balance issues,"past burning sensation" with occasional sharp pain. -front side bottom and tends to switch feet. Can go days without it. Has had some sensation in fingers recently. No cold sensation or heat on foot in feet. Idiopathic with No clear cause. This could certainly contribute to falls but would not explain syncope  ROS-Denies during events or recently any chest pain, headache, shortness of breath, extremity weakness, palpitations. Did have some disorientation around the time of falls and later in day had some odd events such as bumping into fridges in the garage with the car, walking out of house weithout wallet. Had no further episodes of passing out or falls after initial 3.   Past Medical History- chronic atrial fibrillation and had seen Dr. Rayann Heman in the past with no intervention recommended,  Chadsvasc of 2 but has declined NOAC or coumadin on last visit, HLD, HTN  Medications- reviewed and updated Current Outpatient Prescriptions  Medication Sig Dispense Refill  . aspirin 81 MG tablet Take 81 mg by mouth daily.    . Glucosamine-Chondroit-Vit C-Mn (GLUCOSAMINE 1500 COMPLEX PO) Take 2,000 mg by mouth.    . metoprolol succinate (TOPROL-XL) 50 MG 24 hr tablet take 1 tablet by mouth once daily NEEDS OFFICE VISIT 30 tablet 5  . simvastatin (ZOCOR) 20 MG tablet take 1 tablet by mouth once daily (Patient taking differently: take 1/2 tablet by mouth once daily) 30 tablet 5  . acetaminophen (TYLENOL) 325 MG tablet Take 650 mg by mouth every 6 (six) hours as needed.     No current facility-administered medications for this visit.    Objective: BP 104/72 mmHg  Pulse 73  Temp(Src) 99.1 F (37.3 C)  Wt 230 lb (104.327 kg) Gen: NAD, resting comfortably Head: slight bruising on left side of head otherwise NCAT CV: RRR no murmurs rubs or gallops Lungs: CTAB no crackles, wheeze, rhonchi Abdomen: soft/nontender/nondistended/normal bowel sounds. No rebound or guarding.  Ext: no edema Skin: warm, dry Neuro: grossly normal, moves all extremities  EKG: atrial fibrillation with rate 75, right axis, normal qt, prolonged qrs, right bundle new from prior exam, no significant ischemic st or t wave changes   Assessment/Plan:  Syncope Fall 3 falls yesterday, 2 that appear to be related to syncope. My primary  concern is TIA, Given each time he fell to left perhaps with left sided weakness. High risk for this with a fib not on NOAC or coumadin. On the other hand, did hit head yesterday so want imaging of brain before starting on anticoagulant. Given >20 hours since episode and no acute intervention possible if ischemic, proceed with more sensitive MRI brain. Also obtain echo, carotid dopplers, basic labs at this time. EKG with new RBB but no acute st t wave changes and no chest pain, shortness of  breath reported-low suspicion for MI though does have risk factors. Refer to cardiology given a fib history. Consider cardiac monitoring outpatient- ? Episode of RVR or other arrhythmia.  Neuropathy likely contributed to 2nd fall without LOC but doubt as primary cause.   Emergent/Urgent Return precautions advised. Patient is to fly out of town Saturday-Tuesday to see grandson Writer. I recommended remaining here but patient understands risks and will proceed forward but still get all visits/workup completed.    Orders Placed This Encounter  Procedures  . MR Brain Wo Contrast    Standing Status: Future     Number of Occurrences:      Standing Expiration Date: 02/12/2016    Order Specific Question:  Reason for Exam (SYMPTOM  OR DIAGNOSIS REQUIRED)    Answer:  syncopal episode x2 with falls in a fib patient on aspirin only    Order Specific Question:  Preferred imaging location?    Answer:  [id number]    Order Specific Question:  Does the patient have a pacemaker or implanted devices?    Answer:  No    Order Specific Question:  What is the patient's sedation requirement?    Answer:  No Sedation  . CBC    [place]  . Comprehensive metabolic panel    [place]  . TSH      . Ambulatory referral to Cardiology    Referral Priority:  Urgent    Referral Type:  Consultation    Referral Reason:  Specialty Services Required    Referred to Provider:  Thompson Grayer, MD    Requested Specialty:  Cardiology    Number of Visits Requested:  1  . EKG 12-Lead  . Echocardiogram    Standing Status: Future     Number of Occurrences:      Standing Expiration Date: 03/14/2016    Order Specific Question:  Where should this test be performed    Answer:  [facility])    Order Specific Question:  Complete or Limited study?    Answer:  Complete    Order Specific Question:  With Image Enhancing Agent or without Image Enhancing Agent?    Answer:  With Image Enhancing Agent     Order Specific Question:  Reason for exam-Echo    Answer:  Syncope  780.2 / R55

## 2014-12-15 ENCOUNTER — Encounter: Payer: Self-pay | Admitting: Family Medicine

## 2014-12-20 ENCOUNTER — Encounter: Payer: Self-pay | Admitting: Family Medicine

## 2014-12-21 ENCOUNTER — Encounter (HOSPITAL_COMMUNITY): Payer: Self-pay | Admitting: Nurse Practitioner

## 2014-12-21 ENCOUNTER — Ambulatory Visit (HOSPITAL_COMMUNITY)
Admission: RE | Admit: 2014-12-21 | Discharge: 2014-12-21 | Disposition: A | Discharge status: Home / Self Care | Payer: Medicare Other | Source: Ambulatory Visit | Attending: Nurse Practitioner | Admitting: Nurse Practitioner

## 2014-12-21 VITALS — BP 138/82 | HR 76 | Ht 75.0 in | Wt 225.8 lb

## 2014-12-21 DIAGNOSIS — Z7982 Long term (current) use of aspirin: Secondary | ICD-10-CM | POA: Insufficient documentation

## 2014-12-21 DIAGNOSIS — Z8546 Personal history of malignant neoplasm of prostate: Secondary | ICD-10-CM | POA: Insufficient documentation

## 2014-12-21 DIAGNOSIS — Z79899 Other long term (current) drug therapy: Secondary | ICD-10-CM | POA: Diagnosis not present

## 2014-12-21 DIAGNOSIS — G629 Polyneuropathy, unspecified: Secondary | ICD-10-CM | POA: Diagnosis not present

## 2014-12-21 DIAGNOSIS — I482 Chronic atrial fibrillation, unspecified: Secondary | ICD-10-CM

## 2014-12-21 DIAGNOSIS — Z833 Family history of diabetes mellitus: Secondary | ICD-10-CM | POA: Diagnosis not present

## 2014-12-21 DIAGNOSIS — R55 Syncope and collapse: Secondary | ICD-10-CM | POA: Insufficient documentation

## 2014-12-21 DIAGNOSIS — E785 Hyperlipidemia, unspecified: Secondary | ICD-10-CM | POA: Diagnosis not present

## 2014-12-21 DIAGNOSIS — I10 Essential (primary) hypertension: Secondary | ICD-10-CM | POA: Insufficient documentation

## 2014-12-21 DIAGNOSIS — Z87891 Personal history of nicotine dependence: Secondary | ICD-10-CM | POA: Insufficient documentation

## 2014-12-21 DIAGNOSIS — Z8249 Family history of ischemic heart disease and other diseases of the circulatory system: Secondary | ICD-10-CM | POA: Insufficient documentation

## 2014-12-21 NOTE — Progress Notes (Addendum)
Patient ID: Elijah [name], male   DOB: [birth year], 74 y.o.   MRN: [id number]     Primary Care Physician: Elijah Reddish, MD Referring Physician: same    Elijah [name] is a 74 y.o. male with a h/o longstanding permanent atrial fibrillation, rate controlled and a chadsvasc score of 1-2(AGE), HTN listed in problem list, but he denies,  on baby asa. He is being evaluated today as requested by Dr. Yong [name], in the [place] [facility], for recent syncope, questionable TIA and need for anticoagulants.  He was eating breakfast and dropped newspaper and when he went to pick it up the next thing he remembers he was being pulled up from the floor. He hit on his left shoulder and side and also hit the left side of his scalp where he still has mild brising. Presumed LOC due to lapse in time between leaning over and then getting up. Wife heard it and came in to help. Then fell 2 more times over the next hour. He fell backwards in his bedroom trying to get onto his bed and fell again on his left side against the nightstand. Remembered this whole event. About a 1/2 hour later he decided to get up and take a shower. Wife tried to peek in and out to make sure he didn't hurt himself but once again heard a loud thud as he fell to the ground. He remembers starting to fall again but nothing after that point until getting up.   Pt is pending carotid u/s, echo and MRI all scheduled to be done tomorrow. Labs recently checked were normal.  He has not had any further syncope. He was not aware of fast heart rate the am of his syncope/falls. He states his afib has been well rate controlled and he is not aware of any palpitations.  Anticoagulants have been discussed with pt in the past but he has deferred starting drug due to concern of bleeding and "all those commercials on TV". Full discussion of anticoagulants discussed with the patient and he is still hesitate to start drug. He does say however, if the MRI tomorrow does show a TIA/CVA, he  thinks he will start drug. He denies a bleeding history.Has history of mild balance problems but no previous falls/syncopy. He is favoring xarelto or eliquis.  Today, he denies symptoms of palpitations, chest pain, shortness of breath, orthopnea, PND, lower extremity edema, dizziness, presyncope, syncope, or neurologic sequela. The patient is tolerating medications without difficulties and is otherwise without complaint today.   Past Medical History  Diagnosis Date  . Hyperlipidemia   . Hypertension   . Peripheral neuropathy   . Hemorrhoid   . Diverticulosis   . Atrial fibrillation 09-10-11    Chronic A. Fib.-controls with med and Aspirin  . Cancer 09-10-11    dx. Prostate cancer-bx. done 12'12, surgery planned  . Arthritis 09-10-11    rt. hip, knee, hands  . Neuropathy of foot 09-10-11    bilateral ? etiology   Past Surgical History  Procedure Laterality Date  . Colonoscopy  04/2003, 06/05/2011    diverticulosis, internal and external hemorrhoids 2004 and 2012, 2 small polyps 2012  . Tonsillectomy    . Eye surgery  09-10-11    Only stitches to close cut, and observation of hematoma from injury  . Robot assisted laparoscopic radical prostatectomy  09/15/2011    Procedure: ROBOTIC ASSISTED LAPAROSCOPIC RADICAL PROSTATECTOMY LEVEL 2;  Surgeon: Elijah Gray, MD;  Location: [facility];  Service: Urology;  Laterality: N/A;  Current Outpatient Prescriptions  Medication Sig Dispense Refill  . aspirin 81 MG tablet Take 81 mg by mouth daily.    . metoprolol succinate (TOPROL-XL) 50 MG 24 hr tablet take 1 tablet by mouth once daily NEEDS OFFICE VISIT 30 tablet 5  . simvastatin (ZOCOR) 20 MG tablet take 1 tablet by mouth once daily (Patient taking differently: take 1/2 tablet by mouth once daily) 30 tablet 5   No current facility-administered medications for this encounter.    Allergies  Allergen Reactions  . Ciprofloxacin Nausea Only  . Flagyl [Metronidazole] Nausea Only    History    Social History  . Marital Status: Married    Spouse Name: N/A  . Number of Children: 3  . Years of Education: N/A   Occupational History  . Consultant    Social History Main Topics  . Smoking status: Former Smoker    Quit date: 08/04/1968  . Smokeless tobacco: Never Used  . Alcohol Use: No     Comment: none in 20 yrs  . Drug Use: No  . Sexual Activity: Yes   Other Topics Concern  . Not on file   Social History Narrative   Married with 3 kids. 6 grandkids.       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       Hobbies: beach (place at [employer]) usually every 2 weeks.     Family History  Problem Relation Age of Onset  . Heart disease Father 52    myoc infarction  . Diabetes Mother   . Hypertension Mother   . Diverticulosis Mother   . Colon cancer Neg Hx     ROS- All systems are reviewed and negative except as per the HPI above  Physical Exam: Filed Vitals:   12/21/14 0840  BP: 138/82  Pulse: 76  Height: 6\' 3"  (1.905 m)  Weight: 225 lb 12.8 oz (102.422 kg)    GEN- The patient is well appearing, alert and oriented x 3 today.   Head- normocephalic, atraumatic Eyes-  Sclera clear, conjunctiva pink Ears- hearing intact Oropharynx- clear Neck- supple, no JVP Lymph- no cervical lymphadenopathy Lungs- Clear to ausculation bilaterally, normal work of breathing Heart- Irregular rate and rhythm, no murmurs, rubs or gallops, PMI not laterally displaced GI- soft, NT, ND, + BS Extremities- no clubbing, cyanosis, or edema MS- no significant deformity or atrophy Skin- no rash or lesion Psych- euthymic mood, full affect Neuro- strength and sensation are intact  EKG-Atrial fibrillation, rate controlled, RBBB, LPFB,  76 bpm. Epic records reviewed, labs and Elijah [name] notes.  Assessment and Plan: 1. Syncope with h/o longstanding persistent rate controlled afib MRI, Carotid ultrasound and echo pending tomorrow No change in management of  rate controlled afib.   2. CHA2DS2VASc score of 1. On baby asa Full discussion of available anticoagulants, risk vrs benefit with pt still skeptical re starting. He however says that if MRI shows a cerebral accident then he is more willing to start drug. Will review results of test tomorrow and talk to pt further.  3. Possible arrhythmia contributing to syncopy/falls Pt denies feeling any abnormality in his heart rate with episode. Consideration for an event monitor if all other tests cannot explain etiology.  F/u will depend on outcome of tests. Will be in further contact with patient with recommendations after reviewing results.

## 2014-12-21 NOTE — Patient Instructions (Signed)
[place] [phone] for MRI  We will call you after the MRI results.

## 2014-12-22 ENCOUNTER — Other Ambulatory Visit: Payer: Self-pay

## 2014-12-22 ENCOUNTER — Ambulatory Visit (HOSPITAL_COMMUNITY): Payer: Medicare Other | Attending: Cardiology

## 2014-12-22 ENCOUNTER — Telehealth (HOSPITAL_COMMUNITY): Payer: Self-pay | Admitting: Nurse Practitioner

## 2014-12-22 ENCOUNTER — Ambulatory Visit (HOSPITAL_BASED_OUTPATIENT_CLINIC_OR_DEPARTMENT_OTHER): Payer: Medicare Other

## 2014-12-22 ENCOUNTER — Ambulatory Visit
Admission: RE | Admit: 2014-12-22 | Discharge: 2014-12-22 | Disposition: A | Discharge status: Home / Self Care | Payer: Medicare Other | Source: Ambulatory Visit | Attending: Family Medicine | Admitting: Family Medicine

## 2014-12-22 DIAGNOSIS — I10 Essential (primary) hypertension: Secondary | ICD-10-CM | POA: Diagnosis not present

## 2014-12-22 DIAGNOSIS — I482 Chronic atrial fibrillation, unspecified: Secondary | ICD-10-CM

## 2014-12-22 DIAGNOSIS — E785 Hyperlipidemia, unspecified: Secondary | ICD-10-CM | POA: Insufficient documentation

## 2014-12-22 DIAGNOSIS — R55 Syncope and collapse: Secondary | ICD-10-CM

## 2014-12-22 NOTE — Telephone Encounter (Signed)
Review of Elijah [name] MRI does not show any acute cerebral event. This was discussed with pt with his current chadsvasc of 1 and syncopal episode. He is leaning toward starting a [place] but is going out of town to [place] Sunday and does not want to start new medicine before trip. He will call when he returns to discuss further. I did talk to him as well about wearing an event monitor to further assess for arrhythmia and he will think about and discuss further after his trip. I did remind him that driving is restricted after syncope.

## 2014-12-22 NOTE — Addendum Note (Signed)
Encounter addended by: Sherran Needs, NP on: 12/22/2014  2:13 PM<BR>     Documentation filed: Notes Section

## 2015-04-02 ENCOUNTER — Other Ambulatory Visit: Payer: Self-pay | Admitting: *Deleted

## 2015-04-02 MED ORDER — METOPROLOL SUCCINATE ER 50 MG PO TB24: ORAL_TABLET | ORAL | Status: DC

## 2015-05-08 ENCOUNTER — Encounter: Payer: Self-pay | Admitting: Internal Medicine

## 2015-06-06 ENCOUNTER — Encounter: Payer: Self-pay | Admitting: Family Medicine

## 2015-06-06 ENCOUNTER — Ambulatory Visit: Payer: Medicare Other | Admitting: Family Medicine

## 2015-06-06 ENCOUNTER — Ambulatory Visit (INDEPENDENT_AMBULATORY_CARE_PROVIDER_SITE_OTHER): Payer: Medicare Other | Admitting: Family Medicine

## 2015-06-06 VITALS — BP 130/86 | HR 60 | Temp 98.5°F | Wt 223.0 lb

## 2015-06-06 DIAGNOSIS — L570 Actinic keratosis: Secondary | ICD-10-CM | POA: Diagnosis not present

## 2015-06-06 DIAGNOSIS — N183 Chronic kidney disease, stage 3 unspecified: Secondary | ICD-10-CM

## 2015-06-06 DIAGNOSIS — I482 Chronic atrial fibrillation, unspecified: Secondary | ICD-10-CM

## 2015-06-06 DIAGNOSIS — I10 Essential (primary) hypertension: Secondary | ICD-10-CM | POA: Diagnosis not present

## 2015-06-06 LAB — BASIC METABOLIC PANEL
BUN: 23 mg/dL (ref 6–23)
CALCIUM: 9.8 mg/dL (ref 8.4–10.5)
CO2: 30 meq/L (ref 19–32)
CREATININE: 1.46 mg/dL (ref 0.40–1.50)
Chloride: 104 mEq/L (ref 96–112)
GFR: 50.18 mL/min — ABNORMAL LOW (ref >60.00)
GLUCOSE: 89 mg/dL (ref 70–99)
Potassium: 5.3 mEq/L — ABNORMAL HIGH (ref 3.5–5.1)
SODIUM: 142 meq/L (ref 135–145)

## 2015-06-06 NOTE — Assessment & Plan Note (Signed)
S: No further syncopal episodes. 6 months without episodes at this point. Workup included MRI negative for CVA, carotid dopplers 1-39% and echo without embolic source noted. Saw cardiology who recommended NOAC but patient declined. REcommended 30 day monitor and patient never called back to schedule. He is compliant with aspirin and metoprolol. Rate has been controlled A/P: Rate controlled. Compliant with aspirin.  long discussion about NOAC or coumadin vs. Continued aspirin. My calculation of chadscvasc is 2 with 1 point for age 19-75 and 1 point for hypertension. Patient declines going back for 30 day monitor at this time. Understands risks. Also declines NOAC. States he is getting "warmer" to idea of NOAC and at 51, likely will agree with next point on chadsvasc. Continue to monitor.

## 2015-06-06 NOTE — Progress Notes (Signed)
Elijah Reddish, MD  Subjective:  Elijah [name] is a 74 y.o. year old very pleasant male patient who presents for/with See problem oriented charting ROS- No chest pain or shortness of breath. No headache or blurry vision.   Past Medical History-  Patient Active Problem List   Diagnosis Date Noted  . Atrial fibrillation ([place]) 04/06/2007    Priority: High  . CKD (chronic kidney disease), stage III 12/07/2014    Priority: Medium  . 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 Essential hypertension 04/07/2007    Priority: Medium  . Hyperlipidemia 04/06/2007    Priority: Medium  . Osteoarthritis of left hip 08/09/2014    Priority: Low  . Actinic keratosis 06/05/2014    Priority: Low  . History of prostate cancer 09/23/2012    Priority: Low    Medications- reviewed and updated Current Outpatient Prescriptions  Medication Sig Dispense Refill  . aspirin 81 MG tablet Take 81 mg by mouth daily.    . metoprolol succinate (TOPROL-XL) 50 MG 24 hr tablet take 1 tablet by mouth once daily NEEDS OFFICE VISIT 30 tablet 5  . simvastatin (ZOCOR) 20 MG tablet take 1 tablet by mouth once daily (Patient taking differently: take 1/2 tablet by mouth once daily) 30 tablet 5   No current facility-administered medications for this visit.    Objective: BP 130/86 mmHg  Pulse 60  Temp(Src) 98.5 F (36.9 C)  Wt 223 lb (101.152 kg) Gen: NAD, resting comfortably CV: irregularly irregular,  no murmurs rubs or gallops Lungs: CTAB no crackles, wheeze, rhonchi Abdomen: soft/nontender/nondistended/normal bowel sounds. No rebound or guarding.  Ext: 1+ pretibial unchanged edema Skin: warm, dry Neuro: grossly normal, moves all extremities  Assessment/Plan:  Atrial fibrillation (HCC) S: No further syncopal episodes. 6 months without episodes at this point. Workup included MRI negative for CVA, carotid dopplers 1-39% and echo without embolic source noted. Saw cardiology  who recommended NOAC but patient declined. REcommended 30 day monitor and patient never called back to schedule. He is compliant with aspirin and metoprolol. Rate has been controlled A/P: Rate controlled. Compliant with aspirin.  long discussion about NOAC or coumadin vs. Continued aspirin. My calculation of chadscvasc is 2 with 1 point for age 6-75 and 1 point for hypertension. Patient declines going back for 30 day monitor at this time. Understands risks. Also declines NOAC. States he is getting "warmer" to idea of NOAC and at 82, likely will agree with next point on chadsvasc. Continue to monitor.    CKD (chronic kidney disease), stage III S:GFR at 51 previously. BP controlled A/P: repeat GFR off bmet, continue BP control   Essential hypertension S: controlled. On TOprol XL 25mg   BP Readings from Last 3 Encounters:  06/06/15 130/86  12/21/14 138/82  12/13/14 104/72  A/P:Continue current meds:  No change   Actinic keratosis S:4 lesions on scalp similar to prior AK we completed cryotherapy on  O: 3 erythematous lesions with scaly top none larger than 5 mm A/P: cryotherapy completed on 4 lesions with aftercare discussed   6 months or sooner if recurrent episode Return precautions advised.   Orders Placed This Encounter  Procedures  . Basic metabolic panel    [place]

## 2015-06-06 NOTE — Assessment & Plan Note (Signed)
S:4 lesions on scalp similar to prior AK we completed cryotherapy on  O: 3 erythematous lesions with scaly top none larger than 5 mm A/P: cryotherapy completed on 4 lesions with aftercare discussed

## 2015-06-06 NOTE — Assessment & Plan Note (Signed)
S:GFR at 74 previously. BP controlled A/P: repeat GFR off bmet, continue BP control

## 2015-06-06 NOTE — Patient Instructions (Signed)
Check kidney function today before you leave  You opted not to start xarelto at this time but stated you are "getting warmer" and we will plan to firmly start at age 74 unless you have another episode  Continue aspirin daily  Froze 4 precancers on your scalp. I would advise wearing a hat  See me in 6 months

## 2015-06-06 NOTE — Assessment & Plan Note (Signed)
S: controlled. On TOprol XL 25mg   BP Readings from Last 3 Encounters:  06/06/15 130/86  12/21/14 138/82  12/13/14 104/72  A/P:Continue current meds:  No change

## 2015-07-04 ENCOUNTER — Other Ambulatory Visit: Payer: Self-pay | Admitting: Family Medicine

## 2015-07-04 ENCOUNTER — Encounter: Payer: Self-pay | Admitting: Family Medicine

## 2015-07-04 DIAGNOSIS — L989 Disorder of the skin and subcutaneous tissue, unspecified: Secondary | ICD-10-CM

## 2015-07-11 DIAGNOSIS — Z1283 Encounter for screening for malignant neoplasm of skin: Secondary | ICD-10-CM | POA: Diagnosis not present

## 2015-07-11 DIAGNOSIS — D225 Melanocytic nevi of trunk: Secondary | ICD-10-CM | POA: Diagnosis not present

## 2015-07-11 DIAGNOSIS — L570 Actinic keratosis: Secondary | ICD-10-CM | POA: Diagnosis not present

## 2015-07-11 DIAGNOSIS — X32XXXA Exposure to sunlight, initial encounter: Secondary | ICD-10-CM | POA: Diagnosis not present

## 2015-07-11 DIAGNOSIS — C44719 Basal cell carcinoma of skin of left lower limb, including hip: Secondary | ICD-10-CM | POA: Diagnosis not present

## 2015-07-11 DIAGNOSIS — D0359 Melanoma in situ of other part of trunk: Secondary | ICD-10-CM | POA: Diagnosis not present

## 2015-07-18 DIAGNOSIS — D0359 Melanoma in situ of other part of trunk: Secondary | ICD-10-CM | POA: Diagnosis not present

## 2015-08-08 DIAGNOSIS — C61 Malignant neoplasm of prostate: Secondary | ICD-10-CM | POA: Diagnosis not present

## 2015-08-08 DIAGNOSIS — Z08 Encounter for follow-up examination after completed treatment for malignant neoplasm: Secondary | ICD-10-CM | POA: Diagnosis not present

## 2015-08-08 DIAGNOSIS — Z85820 Personal history of malignant melanoma of skin: Secondary | ICD-10-CM | POA: Diagnosis not present

## 2015-08-15 DIAGNOSIS — C61 Malignant neoplasm of prostate: Secondary | ICD-10-CM | POA: Diagnosis not present

## 2015-08-15 DIAGNOSIS — Z0000 Encounter for general adult medical examination without abnormal findings: Secondary | ICD-10-CM | POA: Diagnosis not present

## 2015-08-15 DIAGNOSIS — N5201 Erectile dysfunction due to arterial insufficiency: Secondary | ICD-10-CM | POA: Diagnosis not present

## 2015-08-15 DIAGNOSIS — R8271 Bacteriuria: Secondary | ICD-10-CM | POA: Diagnosis not present

## 2015-09-05 DIAGNOSIS — Z0000 Encounter for general adult medical examination without abnormal findings: Secondary | ICD-10-CM | POA: Diagnosis not present

## 2015-09-05 DIAGNOSIS — N393 Stress incontinence (female) (male): Secondary | ICD-10-CM | POA: Diagnosis not present

## 2015-09-05 DIAGNOSIS — N390 Urinary tract infection, site not specified: Secondary | ICD-10-CM | POA: Diagnosis not present

## 2015-09-24 ENCOUNTER — Other Ambulatory Visit: Payer: Self-pay | Admitting: Family Medicine

## 2015-09-24 MED ORDER — METOPROLOL SUCCINATE ER 50 MG PO TB24: ORAL_TABLET | ORAL | Status: DC

## 2015-10-04 ENCOUNTER — Other Ambulatory Visit: Payer: Self-pay | Admitting: Family Medicine

## 2015-10-04 MED ORDER — SIMVASTATIN 20 MG PO TABS: 10.0000 mg | ORAL_TABLET | Freq: Every day | ORAL | Status: DC

## 2015-11-07 DIAGNOSIS — X32XXXD Exposure to sunlight, subsequent encounter: Secondary | ICD-10-CM | POA: Diagnosis not present

## 2015-11-07 DIAGNOSIS — L570 Actinic keratosis: Secondary | ICD-10-CM | POA: Diagnosis not present

## 2015-11-07 DIAGNOSIS — D225 Melanocytic nevi of trunk: Secondary | ICD-10-CM | POA: Diagnosis not present

## 2015-11-07 DIAGNOSIS — D485 Neoplasm of uncertain behavior of skin: Secondary | ICD-10-CM | POA: Diagnosis not present

## 2015-11-07 DIAGNOSIS — D1801 Hemangioma of skin and subcutaneous tissue: Secondary | ICD-10-CM | POA: Diagnosis not present

## 2015-11-07 DIAGNOSIS — Z85820 Personal history of malignant melanoma of skin: Secondary | ICD-10-CM | POA: Diagnosis not present

## 2015-11-07 DIAGNOSIS — L821 Other seborrheic keratosis: Secondary | ICD-10-CM | POA: Diagnosis not present

## 2015-11-07 DIAGNOSIS — Z08 Encounter for follow-up examination after completed treatment for malignant neoplasm: Secondary | ICD-10-CM | POA: Diagnosis not present

## 2015-12-03 DIAGNOSIS — L570 Actinic keratosis: Secondary | ICD-10-CM | POA: Diagnosis not present

## 2015-12-03 DIAGNOSIS — X32XXXD Exposure to sunlight, subsequent encounter: Secondary | ICD-10-CM | POA: Diagnosis not present

## 2015-12-03 DIAGNOSIS — Z08 Encounter for follow-up examination after completed treatment for malignant neoplasm: Secondary | ICD-10-CM | POA: Diagnosis not present

## 2015-12-03 DIAGNOSIS — D225 Melanocytic nevi of trunk: Secondary | ICD-10-CM | POA: Diagnosis not present

## 2015-12-03 DIAGNOSIS — D485 Neoplasm of uncertain behavior of skin: Secondary | ICD-10-CM | POA: Diagnosis not present

## 2015-12-03 DIAGNOSIS — C44612 Basal cell carcinoma of skin of right upper limb, including shoulder: Secondary | ICD-10-CM | POA: Diagnosis not present

## 2015-12-03 DIAGNOSIS — Z85820 Personal history of malignant melanoma of skin: Secondary | ICD-10-CM | POA: Diagnosis not present

## 2015-12-04 ENCOUNTER — Ambulatory Visit (INDEPENDENT_AMBULATORY_CARE_PROVIDER_SITE_OTHER): Payer: Medicare Other | Admitting: Family Medicine

## 2015-12-04 ENCOUNTER — Encounter: Payer: Self-pay | Admitting: Family Medicine

## 2015-12-04 DIAGNOSIS — N183 Chronic kidney disease, stage 3 unspecified: Secondary | ICD-10-CM

## 2015-12-04 DIAGNOSIS — I482 Chronic atrial fibrillation, unspecified: Secondary | ICD-10-CM

## 2015-12-04 DIAGNOSIS — R1032 Left lower quadrant pain: Secondary | ICD-10-CM

## 2015-12-04 DIAGNOSIS — E785 Hyperlipidemia, unspecified: Secondary | ICD-10-CM | POA: Diagnosis not present

## 2015-12-04 DIAGNOSIS — Z85820 Personal history of malignant melanoma of skin: Secondary | ICD-10-CM | POA: Insufficient documentation

## 2015-12-04 DIAGNOSIS — K219 Gastro-esophageal reflux disease without esophagitis: Secondary | ICD-10-CM

## 2015-12-04 DIAGNOSIS — I10 Essential (primary) hypertension: Secondary | ICD-10-CM | POA: Diagnosis not present

## 2015-12-04 LAB — COMPREHENSIVE METABOLIC PANEL
ALT: 26 U/L (ref 0–53)
AST: 19 U/L (ref 0–37)
Albumin: 4.1 g/dL (ref 3.5–5.2)
Alkaline Phosphatase: 80 U/L (ref 39–117)
BUN: 26 mg/dL — ABNORMAL HIGH (ref 6–23)
CO2: 25 meq/L (ref 19–32)
CREATININE: 1.44 mg/dL (ref 0.40–1.50)
Calcium: 9.4 mg/dL (ref 8.4–10.5)
Chloride: 107 mEq/L (ref 96–112)
GFR: 50.91 mL/min — ABNORMAL LOW (ref >60.00)
Glucose, Bld: 94 mg/dL (ref 70–99)
Potassium: 4.8 mEq/L (ref 3.5–5.1)
Sodium: 140 mEq/L (ref 135–145)
Total Bilirubin: 0.9 mg/dL (ref 0.2–1.2)
Total Protein: 6.6 g/dL (ref 6.0–8.3)

## 2015-12-04 LAB — CBC
HCT: 47.7 % (ref 39.0–52.0)
Hemoglobin: 16.1 g/dL (ref 13.0–17.0)
MCHC: 33.9 g/dL (ref 30.0–36.0)
MCV: 92.5 fl (ref 78.0–100.0)
PLATELETS: 198 10*3/uL (ref 150.0–400.0)
RBC: 5.16 Mil/uL (ref 4.22–5.81)
RDW: 13.2 % (ref 11.5–15.5)
WBC: 8.3 10*3/uL (ref 4.0–10.5)

## 2015-12-04 LAB — LIPID PANEL
CHOL/HDL RATIO: 4
Cholesterol: 161 mg/dL (ref 0–200)
HDL: 38.2 mg/dL — ABNORMAL LOW (ref >39.00)
LDL Cholesterol: 98 mg/dL (ref 0–99)
NonHDL: 122.9
Triglycerides: 126 mg/dL (ref 0.0–149.0)
VLDL: 25.2 mg/dL (ref 0.0–40.0)

## 2015-12-04 MED ORDER — OMEPRAZOLE 20 MG PO CPDR: 20.0000 mg | DELAYED_RELEASE_CAPSULE | Freq: Every day | ORAL | Status: DC

## 2015-12-04 NOTE — Assessment & Plan Note (Addendum)
S: has always been a heavy eater. With certain meals, feels like "whatever is in my stomach is in my throat" with acid sensation. Has sensation of something being in his throat at time. Also has had cough for years, worse in the morning but can happen in the day. No shortness of breath. No runny nose or watery, itchy eyes. Some sneezing. Phlegm at times but can be dry.  A/P: I think that reflux is high on differential for globus sensation and chronic cough. Will trial prilosec for 2 months then reassess. Is a former smoker but quit in 1970- doubt lung cancer. Doubt copd or asthma. Would like to change to zantac at 2 months if able.

## 2015-12-04 NOTE — Assessment & Plan Note (Signed)
S: controlled.  on toprol 25mg  XL on repeat BP Readings from Last 3 Encounters:  12/04/15 118/80  06/06/15 130/86  12/21/14 138/82  A/P:Continue current meds:  No change

## 2015-12-04 NOTE — Assessment & Plan Note (Signed)
S: stable in 70s for GFR on last few checks.  A/P: update kidney function today. Continue BP and lipid control

## 2015-12-04 NOTE — Patient Instructions (Addendum)
Sign release of information at the check out desk for records from dermatologist  Labs before you leave including urine (if new or worsening symptoms and no infection- return to care)  Start prilosec 20mg  daily before breakfast. Follow up in 2 months- hoping this helps with sensation in your throat and cough issue

## 2015-12-04 NOTE — Assessment & Plan Note (Signed)
S: no sycopal episodes. Patient has declined NOAC in past. Patient declined 30 day monitor.  A/P:  Continues to declines- states will consider NOAC at age 75. Continue aspirin. He is aware of stroke risks

## 2015-12-04 NOTE — Progress Notes (Addendum)
Subjective:  Elijah [name] is a 75 y.o. year old very pleasant male patient who presents for/with See problem oriented charting ROS- No chest pain or shortness of breath. No headache or blurry vision.   Past Medical History-  Patient Active Problem List   Diagnosis Date Noted  . Melanoma of skin ([place]) 12/04/2015    Priority: High  . Atrial fibrillation ([place]) 04/06/2007    Priority: High  . GERD (gastroesophageal reflux disease) 12/04/2015    Priority: Medium  . CKD (chronic kidney disease), stage III 12/07/2014    Priority: Medium  . 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 Essential hypertension 04/07/2007    Priority: Medium  . Hyperlipidemia 04/06/2007    Priority: Medium  . Osteoarthritis of left hip 08/09/2014    Priority: Low  . Actinic keratosis 06/05/2014    Priority: Low  . History of prostate cancer 09/23/2012    Priority: Low    Medications- reviewed and updated Current Outpatient Prescriptions  Medication Sig Dispense Refill  . aspirin 81 MG tablet Take 81 mg by mouth daily.    . metoprolol succinate (TOPROL-XL) 50 MG 24 hr tablet take 1 tablet by mouth once daily NEEDS OFFICE VISIT 30 tablet 5  . simvastatin (ZOCOR) 20 MG tablet Take 0.5 tablets (10 mg total) by mouth daily. 30 tablet 5  . omeprazole (PRILOSEC) 20 MG capsule Take 1 capsule (20 mg total) by mouth daily. 30 capsule 3   No current facility-administered medications for this visit.    Objective: BP 118/80 mmHg  Pulse 67  Temp(Src) 98.8 F (37.1 C)  Wt 228 lb (103.42 kg) Gen: NAD, resting comfortably Nares normal, oropharynx normal CV: RRR (apperas in sinus today) no murmurs rubs or gallops Lungs: CTAB no crackles, wheeze, rhonchi Abdomen: soft/nontender- cannot reproduce lower abdominal pain/nondistended/normal bowel sounds. No rebound or guarding.  Ext: no edema Skin: warm, dry, several bandaids on from recent dermatology skin proedures Neuro:  grossly normal, moves all extremities  Assessment/Plan:  Atrial fibrillation (HCC) S: no sycopal episodes. Patient has declined NOAC in past. Patient declined 30 day monitor.  A/P:  Continues to declines- states will consider NOAC at age 28. Continue aspirin. He is aware of stroke risks  CKD (chronic kidney disease), stage III S: stable in 64s for GFR on last few checks.  A/P: update kidney function today. Continue BP and lipid control   Essential hypertension S: controlled.  on toprol 25mg  XL on repeat BP Readings from Last 3 Encounters:  12/04/15 118/80  06/06/15 130/86  12/21/14 138/82  A/P:Continue current meds:  No change   GERD (gastroesophageal reflux disease) S: has always been a heavy eater. With certain meals, feels like "whatever is in my stomach is in my throat" with acid sensation. Has sensation of something being in his throat at time. Also has had cough for years, worse in the morning but can happen in the day. No shortness of breath. No runny nose or watery, itchy eyes. Some sneezing. Phlegm at times but can be dry.  A/P: I think that reflux is high on differential for globus sensation and chronic cough. Will trial prilosec for 2 months then reassess. Is a former smoker but quit in 1970- doubt lung cancer. Doubt copd or asthma. Would like to change to zantac at 2 months if able.   Lower abdominal pain S:Vague lower abdominal pain had UTI at urology last time with similar feeling. Very mild nausea as well  A/P: check urine culture, return precautions advised Return in about 2 months (around 02/03/2016). Return precautions advised.   fasting Orders Placed This Encounter  Procedures  . Urine culture    [facility]  . CBC    [facility] [place]  . Comprehensive metabolic panel    [place]    Order Specific Question:  Has the patient fasted?    Answer:  No  . Lipid panel    [place]    Order Specific Question:  Has the patient fasted?    Answer:  No    Meds ordered this  encounter  Medications  . omeprazole (PRILOSEC) 20 MG capsule    Sig: Take 1 capsule (20 mg total) by mouth daily.    Dispense:  30 capsule    Refill:  3    [name], MD

## 2015-12-06 LAB — URINE CULTURE: Colony Count: 6000

## 2016-02-07 ENCOUNTER — Ambulatory Visit (INDEPENDENT_AMBULATORY_CARE_PROVIDER_SITE_OTHER): Payer: Medicare Other | Admitting: Family Medicine

## 2016-02-07 ENCOUNTER — Encounter: Payer: Self-pay | Admitting: Family Medicine

## 2016-02-07 VITALS — BP 130/86 | HR 68 | Temp 98.6°F | Ht 75.0 in | Wt 223.0 lb

## 2016-02-07 DIAGNOSIS — K219 Gastro-esophageal reflux disease without esophagitis: Secondary | ICD-10-CM | POA: Diagnosis not present

## 2016-02-07 DIAGNOSIS — I482 Chronic atrial fibrillation, unspecified: Secondary | ICD-10-CM

## 2016-02-07 MED ORDER — OMEPRAZOLE 20 MG PO CPDR: 20.0000 mg | DELAYED_RELEASE_CAPSULE | Freq: Every day | ORAL | Status: DC

## 2016-02-07 NOTE — Assessment & Plan Note (Signed)
S: still taking ASA 81 mg (epistaxis on 1/2 of 325mg ) and Rate control with metoprolol (used to run around low 100s).  A/P: chadsvasc remains a 2- continues to decline NOAC or coumadin- once again willing to reconsider when reaches age 75 and chadsvasc score elevates to 3 but declines for now

## 2016-02-07 NOTE — Assessment & Plan Note (Signed)
S: Patient states sensation of foods in his stomach being in his throat is much improved with start of omeprazole. He has also noted improvement in chronic cough per wife- still some issues in Am and PM but less. Does not have complete resolution. Very mild nausea at times when takes medication. No longer having vague lower abdominal pain A/P: concerned about long term PPI side effects. We will continue for 5 more months then trial 1 month of zantac before follow up for AWV. He is a former smoker- higher risk esophageal cancer- may get GI input if does not continue to improve

## 2016-02-07 NOTE — Patient Instructions (Addendum)
Continue omeprazole for next 5 months, then about a month before your next visit change to zantac 150mg  twice a day. If symptoms worsen can go back to the omeprazole  No other changes today

## 2016-02-07 NOTE — Progress Notes (Signed)
Subjective:  Elijah [name] is a 75 y.o. year old very pleasant male patient who presents for/with See problem oriented charting ROS- no fever, chills, chest pain or shortness of breath.see any ROS included in HPI as well.   Past Medical History-  Patient Active Problem List   Diagnosis Date Noted  . Melanoma of skin ([place]) 12/04/2015    Priority: High  . Atrial fibrillation ([place]) 04/06/2007    Priority: High  . GERD (gastroesophageal reflux disease) 12/04/2015    Priority: Medium  . CKD (chronic kidney disease), stage III 12/07/2014    Priority: Medium  . 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 Essential hypertension 04/07/2007    Priority: Medium  . Hyperlipidemia 04/06/2007    Priority: Medium  . Osteoarthritis of left hip 08/09/2014    Priority: Low  . Actinic keratosis 06/05/2014    Priority: Low  . History of prostate cancer 09/23/2012    Priority: Low    Medications- reviewed and updated Current Outpatient Prescriptions  Medication Sig Dispense Refill  . aspirin 81 MG tablet Take 81 mg by mouth daily.    . metoprolol succinate (TOPROL-XL) 50 MG 24 hr tablet take 1 tablet by mouth once daily NEEDS OFFICE VISIT (Patient taking differently: No sig reported) 30 tablet 5  . omeprazole (PRILOSEC) 20 MG capsule Take 1 capsule (20 mg total) by mouth daily. 30 capsule 5  . simvastatin (ZOCOR) 20 MG tablet Take 0.5 tablets (10 mg total) by mouth daily. 30 tablet 5   No current facility-administered medications for this visit.    Objective: BP 130/86 mmHg  Pulse 68  Temp(Src) 98.6 F (37 C) (Oral)  Ht 6\' 3"  (1.905 m)  Wt 223 lb (101.152 kg)  BMI 27.87 kg/m2 Gen: NAD, resting comfortably CV: irregularly irregular  no murmurs rubs or gallops Lungs: CTAB no crackles, wheeze, rhonchi Abdomen: soft/nontender/nondistended/normal bowel sounds. No rebound or guarding.  Ext: no edema Skin: warm, dry, no rash Neuro: grossly normal, moves  all extremities  Assessment/Plan:  GERD (gastroesophageal reflux disease) S: Patient states sensation of foods in his stomach being in his throat is much improved with start of omeprazole. He has also noted improvement in chronic cough per wife- still some issues in Am and PM but less. Does not have complete resolution. Very mild nausea at times when takes medication. No longer having vague lower abdominal pain A/P: concerned about long term PPI side effects. We will continue for 5 more months then trial 1 month of zantac before follow up for AWV. He is a former smoker- higher risk esophageal cancer- may get GI input if does not continue to improve   Atrial fibrillation (HCC) S: still taking ASA 81 mg (epistaxis on 1/2 of 325mg ) and Rate control with metoprolol (used to run around low 100s).  A/P: chadsvasc remains a 2- continues to decline NOAC or coumadin- once again willing to reconsider when reaches age 26 and chadsvasc score elevates to 3 but declines for now    Return in about 6 months (around 08/09/2016) for annual wellness visit. come fasting. .   Meds ordered this encounter  Medications  . omeprazole (PRILOSEC) 20 MG capsule    Sig: Take 1 capsule (20 mg total) by mouth daily.    Dispense:  30 capsule    Refill:  5    Return precautions advised.  [name], MD

## 2016-02-13 DIAGNOSIS — Z85820 Personal history of malignant melanoma of skin: Secondary | ICD-10-CM | POA: Diagnosis not present

## 2016-02-13 DIAGNOSIS — Z1283 Encounter for screening for malignant neoplasm of skin: Secondary | ICD-10-CM | POA: Diagnosis not present

## 2016-02-13 DIAGNOSIS — L820 Inflamed seborrheic keratosis: Secondary | ICD-10-CM | POA: Diagnosis not present

## 2016-02-13 DIAGNOSIS — C44519 Basal cell carcinoma of skin of other part of trunk: Secondary | ICD-10-CM | POA: Diagnosis not present

## 2016-02-13 DIAGNOSIS — Z08 Encounter for follow-up examination after completed treatment for malignant neoplasm: Secondary | ICD-10-CM | POA: Diagnosis not present

## 2016-03-07 ENCOUNTER — Encounter: Payer: Medicare Other | Admitting: Family Medicine

## 2016-03-15 ENCOUNTER — Other Ambulatory Visit: Payer: Self-pay | Admitting: Family Medicine

## 2016-05-13 ENCOUNTER — Encounter: Payer: Self-pay | Admitting: Family Medicine

## 2016-05-13 ENCOUNTER — Inpatient Hospital Stay (HOSPITAL_COMMUNITY)
Admission: EM | Admit: 2016-05-13 | Discharge: 2016-05-15 | DRG: 065 | Disposition: A | Discharge status: Home / Self Care | Payer: Medicare Other | Attending: Internal Medicine | Admitting: Internal Medicine

## 2016-05-13 ENCOUNTER — Emergency Department (HOSPITAL_COMMUNITY): Payer: Medicare Other

## 2016-05-13 ENCOUNTER — Ambulatory Visit (INDEPENDENT_AMBULATORY_CARE_PROVIDER_SITE_OTHER): Payer: Medicare Other | Admitting: Family Medicine

## 2016-05-13 ENCOUNTER — Encounter (HOSPITAL_COMMUNITY): Payer: Self-pay | Admitting: Emergency Medicine

## 2016-05-13 VITALS — BP 152/90 | HR 62 | Temp 97.6°F | Wt 224.8 lb

## 2016-05-13 DIAGNOSIS — E663 Overweight: Secondary | ICD-10-CM | POA: Diagnosis present

## 2016-05-13 DIAGNOSIS — I63411 Cerebral infarction due to embolism of right middle cerebral artery: Principal | ICD-10-CM | POA: Diagnosis present

## 2016-05-13 DIAGNOSIS — I482 Chronic atrial fibrillation, unspecified: Secondary | ICD-10-CM

## 2016-05-13 DIAGNOSIS — R410 Disorientation, unspecified: Secondary | ICD-10-CM | POA: Diagnosis not present

## 2016-05-13 DIAGNOSIS — R51 Headache: Secondary | ICD-10-CM | POA: Diagnosis not present

## 2016-05-13 DIAGNOSIS — R471 Dysarthria and anarthria: Secondary | ICD-10-CM | POA: Diagnosis present

## 2016-05-13 DIAGNOSIS — K219 Gastro-esophageal reflux disease without esophagitis: Secondary | ICD-10-CM | POA: Diagnosis present

## 2016-05-13 DIAGNOSIS — G629 Polyneuropathy, unspecified: Secondary | ICD-10-CM | POA: Diagnosis not present

## 2016-05-13 DIAGNOSIS — E042 Nontoxic multinodular goiter: Secondary | ICD-10-CM | POA: Diagnosis present

## 2016-05-13 DIAGNOSIS — Z8673 Personal history of transient ischemic attack (TIA), and cerebral infarction without residual deficits: Secondary | ICD-10-CM

## 2016-05-13 DIAGNOSIS — R29810 Facial weakness: Secondary | ICD-10-CM | POA: Diagnosis not present

## 2016-05-13 DIAGNOSIS — E041 Nontoxic single thyroid nodule: Secondary | ICD-10-CM

## 2016-05-13 DIAGNOSIS — G459 Transient cerebral ischemic attack, unspecified: Secondary | ICD-10-CM | POA: Diagnosis present

## 2016-05-13 DIAGNOSIS — N183 Chronic kidney disease, stage 3 unspecified: Secondary | ICD-10-CM | POA: Diagnosis present

## 2016-05-13 DIAGNOSIS — I129 Hypertensive chronic kidney disease with stage 1 through stage 4 chronic kidney disease, or unspecified chronic kidney disease: Secondary | ICD-10-CM | POA: Diagnosis present

## 2016-05-13 DIAGNOSIS — Z8249 Family history of ischemic heart disease and other diseases of the circulatory system: Secondary | ICD-10-CM

## 2016-05-13 DIAGNOSIS — Z7982 Long term (current) use of aspirin: Secondary | ICD-10-CM

## 2016-05-13 DIAGNOSIS — R519 Headache, unspecified: Secondary | ICD-10-CM

## 2016-05-13 DIAGNOSIS — I161 Hypertensive emergency: Secondary | ICD-10-CM | POA: Diagnosis not present

## 2016-05-13 DIAGNOSIS — I10 Essential (primary) hypertension: Secondary | ICD-10-CM | POA: Diagnosis present

## 2016-05-13 DIAGNOSIS — Z833 Family history of diabetes mellitus: Secondary | ICD-10-CM

## 2016-05-13 DIAGNOSIS — R7303 Prediabetes: Secondary | ICD-10-CM | POA: Diagnosis present

## 2016-05-13 DIAGNOSIS — Z8546 Personal history of malignant neoplasm of prostate: Secondary | ICD-10-CM

## 2016-05-13 DIAGNOSIS — Z87891 Personal history of nicotine dependence: Secondary | ICD-10-CM

## 2016-05-13 DIAGNOSIS — G8194 Hemiplegia, unspecified affecting left nondominant side: Secondary | ICD-10-CM | POA: Diagnosis not present

## 2016-05-13 DIAGNOSIS — I4891 Unspecified atrial fibrillation: Secondary | ICD-10-CM | POA: Diagnosis present

## 2016-05-13 DIAGNOSIS — E7800 Pure hypercholesterolemia, unspecified: Secondary | ICD-10-CM | POA: Diagnosis present

## 2016-05-13 DIAGNOSIS — Z6829 Body mass index (BMI) 29.0-29.9, adult: Secondary | ICD-10-CM

## 2016-05-13 DIAGNOSIS — I639 Cerebral infarction, unspecified: Secondary | ICD-10-CM

## 2016-05-13 LAB — I-STAT CHEM 8, ED
BUN: 32 mg/dL — ABNORMAL HIGH (ref 6–20)
CALCIUM ION: 1.17 mmol/L (ref 1.15–1.40)
Chloride: 106 mmol/L (ref 101–111)
Creatinine, Ser: 1.6 mg/dL — ABNORMAL HIGH (ref 0.61–1.24)
Glucose, Bld: 106 mg/dL — ABNORMAL HIGH (ref 65–99)
HCT: 49 % (ref 39.0–52.0)
HEMOGLOBIN: 16.7 g/dL (ref 13.0–17.0)
Potassium: 4.7 mmol/L (ref 3.5–5.1)
SODIUM: 142 mmol/L (ref 135–145)
TCO2: 27 mmol/L (ref 0–100)

## 2016-05-13 LAB — URINALYSIS, ROUTINE W REFLEX MICROSCOPIC
BILIRUBIN URINE: NEGATIVE
Glucose, UA: NEGATIVE mg/dL
Hgb urine dipstick: NEGATIVE
KETONES UR: NEGATIVE mg/dL
Leukocytes, UA: NEGATIVE
NITRITE: NEGATIVE
PH: 5 (ref 5.0–8.0)
PROTEIN: NEGATIVE mg/dL
Specific Gravity, Urine: 1.027 (ref 1.005–1.030)

## 2016-05-13 LAB — COMPREHENSIVE METABOLIC PANEL
ALK PHOS: 87 U/L (ref 38–126)
ALT: 23 U/L (ref 17–63)
AST: 23 U/L (ref 15–41)
Albumin: 4.2 g/dL (ref 3.5–5.0)
Anion gap: 7 (ref 5–15)
BUN: 24 mg/dL — AB (ref 6–20)
CHLORIDE: 107 mmol/L (ref 101–111)
CO2: 26 mmol/L (ref 22–32)
CREATININE: 1.62 mg/dL — AB (ref 0.61–1.24)
Calcium: 9.5 mg/dL (ref 8.9–10.3)
GFR calc non Af Amer: 40 mL/min — ABNORMAL LOW (ref >60)
GFR, EST AFRICAN AMERICAN: 47 mL/min — AB (ref >60)
Glucose, Bld: 112 mg/dL — ABNORMAL HIGH (ref 65–99)
Potassium: 4.8 mmol/L (ref 3.5–5.1)
SODIUM: 140 mmol/L (ref 135–145)
TOTAL PROTEIN: 7.2 g/dL (ref 6.5–8.1)
Total Bilirubin: 0.3 mg/dL (ref 0.3–1.2)

## 2016-05-13 LAB — RAPID URINE DRUG SCREEN, HOSP PERFORMED
Amphetamines: NOT DETECTED
BARBITURATES: NOT DETECTED
Benzodiazepines: NOT DETECTED
COCAINE: NOT DETECTED
OPIATES: NOT DETECTED
Tetrahydrocannabinol: NOT DETECTED

## 2016-05-13 LAB — CBC
HEMATOCRIT: 46.9 % (ref 39.0–52.0)
Hemoglobin: 16.2 g/dL (ref 13.0–17.0)
MCH: 31.8 pg (ref 26.0–34.0)
MCHC: 34.5 g/dL (ref 30.0–36.0)
MCV: 92 fL (ref 78.0–100.0)
Platelets: 195 10*3/uL (ref 150–400)
RBC: 5.1 MIL/uL (ref 4.22–5.81)
RDW: 13.2 % (ref 11.5–15.5)
WBC: 8.5 10*3/uL (ref 4.0–10.5)

## 2016-05-13 LAB — I-STAT TROPONIN, ED: TROPONIN I, POC: 0 ng/mL (ref 0.00–0.08)

## 2016-05-13 LAB — DIFFERENTIAL
BASOS ABS: 0.1 10*3/uL (ref 0.0–0.1)
BASOS PCT: 1 %
Eosinophils Absolute: 0.2 10*3/uL (ref 0.0–0.7)
Eosinophils Relative: 3 %
LYMPHS PCT: 15 %
Lymphs Abs: 1.3 10*3/uL (ref 0.7–4.0)
MONO ABS: 0.6 10*3/uL (ref 0.1–1.0)
MONOS PCT: 7 %
NEUTROS ABS: 6.3 10*3/uL (ref 1.7–7.7)
Neutrophils Relative %: 74 %

## 2016-05-13 LAB — CBG MONITORING, ED: GLUCOSE-CAPILLARY: 106 mg/dL — AB (ref 65–99)

## 2016-05-13 LAB — APTT: APTT: 31 s (ref 24–36)

## 2016-05-13 LAB — PROTIME-INR
INR: 1.01
Prothrombin Time: 13.3 seconds (ref 11.4–15.2)

## 2016-05-13 NOTE — ED Provider Notes (Signed)
[facility] DEPT Provider Note   CSN: [id number] Arrival date & time: 05/13/16  1734     History   Chief Complaint Chief Complaint  Patient presents with  . Headache  . Facial Droop    HPI Kendal Gaw is a 75 y.o. male.  Mr. Benard is a 75 year old gentleman with PMH A-fib, HTN, HLD, peripheral neuropathy, CKD3, prostate cancer who presents for headache and reported right facial droop. Per the patient's wife she came to find him with droop of the right side of his face with dysarthria. The patient does not recall having these symptoms - just a right sided aching headache behind his right eye that has since resolved. They visited his PCP (Dr.  [name]) who advised them to go to ER. The patient denies weakness, new numbness, or confusion. He reports intermittent falls at home attributed to neuropathy in his feet of unclear etiology. He has history of hospitalization in the past for episode concerning for TIA (confusion, fall) with negative stroke workup at that time. He is not on anticoagulation, merely rate control for his A-fib.    The history is provided by the patient. No language interpreter was used.    Past Medical History:  Diagnosis Date  . Arthritis 09-10-11   rt. hip, knee, hands  . Atrial fibrillation ([place]) 09-10-11   Chronic A. Fib.-controls with med and Aspirin  . Cancer ([place]) 09-10-11   dx. Prostate cancer-bx. done 12'12, surgery planned  . Diverticulosis   . Hemorrhoid   . Hyperlipidemia   . Hypertension   . 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 etiology  . Peripheral neuropathy [facility])     Patient Active Problem List   Diagnosis Date Noted  . Melanoma of skin ([place]) 12/04/2015  . GERD (gastroesophageal reflux disease) 12/04/2015  . CKD (chronic kidney disease), stage III 12/07/2014  . Osteoarthritis of left hip 08/09/2014  . Actinic keratosis 06/05/2014  . History of prostate cancer 09/23/2012  . 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    . Essential hypertension 04/07/2007  . Hyperlipidemia 04/06/2007  . Atrial fibrillation ([place]) 04/06/2007    Past Surgical History:  Procedure Laterality Date  . COLONOSCOPY  04/2003, 06/05/2011   diverticulosis, internal and external hemorrhoids 2004 and 2012, 2 small polyps 2012  . EYE SURGERY  09-10-11   Only stitches to close cut, and observation of hematoma from injury  . ROBOT ASSISTED LAPAROSCOPIC RADICAL PROSTATECTOMY  09/15/2011   Procedure: ROBOTIC ASSISTED LAPAROSCOPIC RADICAL PROSTATECTOMY LEVEL 2;  Surgeon: Dutch Gray, MD;  Location: [facility] ORS;  Service: Urology;  Laterality: N/A;        . TONSILLECTOMY         Home Medications    Prior to Admission medications   Medication Sig Start Date End Date Taking? Authorizing Provider  aspirin 81 MG tablet Take 81 mg by mouth daily.    Historical Provider, MD  metoprolol succinate (TOPROL-XL) 50 MG 24 hr tablet Take 1 tablet (50 mg total) by mouth daily. take 1 tablet by mouth once daily 03/17/16   [name], MD  omeprazole (PRILOSEC) 20 MG capsule Take 1 capsule (20 mg total) by mouth daily. 02/07/16   [name], MD  simvastatin (ZOCOR) 20 MG tablet Take 0.5 tablets (10 mg total) by mouth daily. 10/04/15   [name], MD    Family History Family History  Problem Relation Age of Onset  . Diabetes Mother   . Hypertension Mother   .  Diverticulosis Mother   . Heart disease Father 97    myoc infarction  . Colon cancer Neg Hx     Social History Social History  Substance Use Topics  . Smoking status: Former Smoker    Quit date: 08/04/1968  . Smokeless tobacco: Never Used  . Alcohol use No     Comment: none in 20 yrs     Allergies   Ciprofloxacin and Flagyl [metronidazole]   Review of Systems Review of Systems  Constitutional: Negative for chills and fever.  Respiratory: Negative for shortness of breath.   Cardiovascular: Negative for chest pain.  Gastrointestinal: Negative for abdominal pain,  diarrhea, nausea and vomiting.  Neurological: Positive for facial asymmetry, speech difficulty, numbness and headaches. Negative for dizziness, tremors, weakness and light-headedness.  All other systems reviewed and are negative.    Physical Exam Updated Vital Signs BP 170/96 (BP Location: Right Arm)   Pulse 97   Temp 97.7 F (36.5 C) (Oral)   Resp 18   Ht 6\' 1"  (1.854 m)   Wt 101.6 kg   SpO2 97%   BMI 29.55 kg/m   Physical Exam  Constitutional: He is oriented to person, place, and time. He appears well-developed and well-nourished. No distress.  HENT:  Head: Normocephalic and atraumatic.  Eyes: EOM are normal. Pupils are equal, round, and reactive to light.  Neck: Normal range of motion. Neck supple. No JVD present.  No carotid bruit  Cardiovascular: Normal rate, normal heart sounds and intact distal pulses.  Exam reveals no gallop and no friction rub.   No murmur heard. Irregularly irregular rhythm  Pulmonary/Chest: Effort normal and breath sounds normal. No respiratory distress. He has no wheezes. He has no rales.  Abdominal: Soft. Bowel sounds are normal. He exhibits distension (mild distension). There is no tenderness. There is no guarding.  Musculoskeletal: Normal range of motion. He exhibits no edema, tenderness or deformity.  Neurological: He is alert and oriented to person, place, and time. He has normal strength. He displays no atrophy and no tremor. A sensory deficit (decreased sensation in feet/ankles bilaterally (chronic)) is present. No cranial nerve deficit. He exhibits normal muscle tone. Coordination (mild dysmetria with FNF bilaterally) abnormal.  Skin: Skin is warm and dry. Capillary refill takes less than 2 seconds. He is not diaphoretic.  Psychiatric: He has a normal mood and affect. His behavior is normal. Thought content normal.     ED Treatments / Results  Labs (all labs ordered are listed, but only abnormal results are displayed) Labs Reviewed    COMPREHENSIVE METABOLIC PANEL - Abnormal; Notable for the following:       Result Value   Glucose, Bld 112 (*)    BUN 24 (*)    Creatinine, Ser 1.62 (*)    GFR calc non Af Amer 40 (*)    GFR calc Af Amer 47 (*)    All other components within normal limits  URINALYSIS, ROUTINE W REFLEX MICROSCOPIC (NOT AT [facility]) - Abnormal; Notable for the following:    APPearance HAZY (*)    All other components within normal limits  CBG MONITORING, ED - Abnormal; Notable for the following:    Glucose-Capillary 106 (*)    All other components within normal limits  I-STAT CHEM 8, ED - Abnormal; Notable for the following:    BUN 32 (*)    Creatinine, Ser 1.60 (*)    Glucose, Bld 106 (*)    All other components within normal limits  PROTIME-INR  APTT  CBC  DIFFERENTIAL  RAPID URINE DRUG SCREEN, HOSP PERFORMED  I-STAT TROPOININ, ED    EKG  EKG Interpretation None       Radiology Ct Head Wo Contrast  Result Date: 05/13/2016 CLINICAL DATA:  Acute onset of facial droop, slurred speech and right-sided headache. Initial encounter. EXAM: CT HEAD WITHOUT CONTRAST TECHNIQUE: Contiguous axial images were obtained from the base of the skull through the vertex without intravenous contrast. COMPARISON:  MRI of the brain performed 12/22/2014 FINDINGS: Brain: No evidence of acute infarction, hemorrhage, hydrocephalus, extra-axial collection or mass lesion/mass effect. Prominence of the ventricles and sulci suggests mild cortical volume loss. The posterior fossa, including the cerebellum, brainstem and fourth ventricle, is within normal limits. The basal ganglia are unremarkable in appearance. The cerebral hemispheres are symmetric in appearance, with normal gray-white differentiation. No mass effect or midline shift is seen. Vascular: No hyperdense vessel or unexpected calcification. Skull: There is no evidence of fracture; visualized osseous structures are unremarkable in appearance. Sinuses/Orbits: The orbits  are within normal limits. The paranasal sinuses and mastoid air cells are well-aerated. Other: No significant soft tissue abnormalities are seen. IMPRESSION: 1. No acute intracranial pathology seen on CT. 2. Mild cortical volume loss noted. Electronically Signed   By: Garald Balding M.D.   On: 05/13/2016 20:21    Procedures Procedures (including critical care time)  Medications Ordered in ED Medications - No data to display   Initial Impression / Assessment and Plan / ED Course  I have reviewed the triage vital signs and the nursing notes.  Pertinent labs & imaging results that were available during my care of the patient were reviewed by me and considered in my medical decision making (see chart for details).  Clinical Course  Value Comment By Time  BUN: (!) 32 (Reviewed) Asencion Partridge, MD 10/10 2347  Creatinine: (!) 1.60 (Reviewed) Asencion Partridge, MD 10/10 2348   Mr. Flexer is 75 yo gentleman with PMH Afib (not on anticoagulation, CHADsVASc score 2), HTN, HLD, CKD, peripheral neuropathy who presents following clinical history consistent with TIA at home. His neurologic symptoms had resolved by time of evaluation - but patient has numerous risk factors for stroke and has history of TIA-like episode in 2016 requiring full stroke workup at that time. Patient hypertensive to 190/110 on exam, his pressure typically runs in 110s-130s/80s. Will be admitted to obs for stroke workup, initiation of anticoagulation, bp control.Marland Kitchen  MRI showed multifocal right MCA-territory infarcts of differing ages as well as evidence of chronic lacunar infarcts and microvascular ischemic changes - called and discussed with Dr. Shon Hale of neurology.  Final Clinical Impressions(s) / ED Diagnoses   Final diagnoses:  None    New Prescriptions New Prescriptions   No medications on file     Asencion Partridge, MD 05/14/16 [id number]    Ripley Fraise, MD 05/14/16 [phone]

## 2016-05-13 NOTE — ED Triage Notes (Signed)
Pt presents to ED for assessment of right sided facial droop, slurred speech and headache behind the right eye.  Pt assessed in triage by Dr. Rogene Houston, and will order non-Code Stroke protocol.

## 2016-05-13 NOTE — Progress Notes (Signed)
Pre visit review using our clinic review tool, if applicable. No additional management support is needed unless otherwise documented below in the visit note. 

## 2016-05-13 NOTE — Progress Notes (Signed)
Subjective:  Elijah [name] is a 75 y.o. year old very pleasant male patient who presents for/with See problem oriented charting ROS- facial weakness noted as below- none at present. no extremity weakness. Some difficulty speaking/confusion with possible slurred words. no trouble swallowing. no blurry vision or double vision. No paresthesias. see any ROS included in HPI as well.   Past Medical History-  Patient Active Problem List   Diagnosis Date Noted  . Melanoma of skin ([place]) 12/04/2015    Priority: High  . Atrial fibrillation ([place]) 04/06/2007    Priority: High  . GERD (gastroesophageal reflux disease) 12/04/2015    Priority: Medium  . CKD (chronic kidney disease), stage III 12/07/2014    Priority: Medium  . 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 Essential hypertension 04/07/2007    Priority: Medium  . Hyperlipidemia 04/06/2007    Priority: Medium  . Osteoarthritis of left hip 08/09/2014    Priority: Low  . Actinic keratosis 06/05/2014    Priority: Low  . History of prostate cancer 09/23/2012    Priority: Low    Medications- reviewed and updated Current Outpatient Prescriptions  Medication Sig Dispense Refill  . aspirin 81 MG tablet Take 81 mg by mouth daily.    . metoprolol succinate (TOPROL-XL) 50 MG 24 hr tablet Take 1 tablet (50 mg total) by mouth daily. take 1 tablet by mouth once daily 30 tablet 5  . omeprazole (PRILOSEC) 20 MG capsule Take 1 capsule (20 mg total) by mouth daily. 30 capsule 5  . simvastatin (ZOCOR) 20 MG tablet Take 0.5 tablets (10 mg total) by mouth daily. 30 tablet 5   No current facility-administered medications for this visit.     Objective: BP (!) 152/90 (BP Location: Left Arm, Patient Position: Sitting, Cuff Size: Large)   Pulse 62   Temp 97.6 F (36.4 C) (Oral)   Wt 224 lb 12.8 oz (102 kg)   SpO2 97%   BMI 28.10 kg/m  Gen: NAD, resting comfortably CV: irregularly irregular no murmurs rubs or  gallops Lungs: CTAB no crackles, wheeze, rhonchi Abdomen: soft/nontender/nondistended/normal bowel sounds. No rebound or guarding.  Ext: no edema Skin: warm, dry Neuro: CN II-XII intact (mild decreased hearing bilaterally), sensation and reflexes normal throughout, 5/5 muscle strength in bilateral upper and lower extremities. Normal finger to nose. Normal rapid alternating movements. No pronator drift. Normal romberg. Normal gait.   Assessment/Plan:  Potential CVA vs. TIA Weakness on right side of face Confusion Chronic atrial fibrillation (HCC) Right sided headache S:Unsteady on feet this afternoon. Wife went shopping around 2 PM. Got back before 4 PM. She noted right side of mouth drooping and drooling out of left side and talking very slow with possible slurred words. Right eye almost closed. Seemed to be talking out of right side of mouth oddly. All of these symptoms have resolved but with persistent 3-4/10 headache behind right eye.  Last definitely known normal 2 PM but he is not sure when he started to have symptoms between 2-4 PM.  A/P: concern is for TIA vs. CVA. I am concerned about CVA with lingering headache through neurological exam appears to have returned to baseline. Known atrial fibrillation with chadsvasc of 2 (age, HTN) but with possible prior TIA with syncopal episodes not in this calculation. Patient is to drive immediately to [facility] ER. Spoke with nursing staff who assures me prompt treatment in ER. Strong suspicion this is embolic but needs CT before treatment. He does  agree to blood thinner treatment/potential NOAC at this point after being strongly resistant in past if needed  [name], MD

## 2016-05-14 ENCOUNTER — Observation Stay (HOSPITAL_COMMUNITY): Payer: Medicare Other

## 2016-05-14 ENCOUNTER — Encounter (HOSPITAL_COMMUNITY): Payer: Self-pay | Admitting: Radiology

## 2016-05-14 DIAGNOSIS — I63412 Cerebral infarction due to embolism of left middle cerebral artery: Secondary | ICD-10-CM

## 2016-05-14 DIAGNOSIS — I6789 Other cerebrovascular disease: Secondary | ICD-10-CM | POA: Diagnosis not present

## 2016-05-14 DIAGNOSIS — I639 Cerebral infarction, unspecified: Secondary | ICD-10-CM | POA: Diagnosis not present

## 2016-05-14 DIAGNOSIS — E663 Overweight: Secondary | ICD-10-CM | POA: Diagnosis present

## 2016-05-14 DIAGNOSIS — Z8249 Family history of ischemic heart disease and other diseases of the circulatory system: Secondary | ICD-10-CM | POA: Diagnosis not present

## 2016-05-14 DIAGNOSIS — Z7982 Long term (current) use of aspirin: Secondary | ICD-10-CM | POA: Diagnosis not present

## 2016-05-14 DIAGNOSIS — I63411 Cerebral infarction due to embolism of right middle cerebral artery: Secondary | ICD-10-CM | POA: Diagnosis present

## 2016-05-14 DIAGNOSIS — I482 Chronic atrial fibrillation: Secondary | ICD-10-CM | POA: Diagnosis not present

## 2016-05-14 DIAGNOSIS — R7303 Prediabetes: Secondary | ICD-10-CM | POA: Diagnosis present

## 2016-05-14 DIAGNOSIS — Z6829 Body mass index (BMI) 29.0-29.9, adult: Secondary | ICD-10-CM | POA: Diagnosis not present

## 2016-05-14 DIAGNOSIS — Z833 Family history of diabetes mellitus: Secondary | ICD-10-CM | POA: Diagnosis not present

## 2016-05-14 DIAGNOSIS — Z8673 Personal history of transient ischemic attack (TIA), and cerebral infarction without residual deficits: Secondary | ICD-10-CM | POA: Diagnosis present

## 2016-05-14 DIAGNOSIS — N183 Chronic kidney disease, stage 3 (moderate): Secondary | ICD-10-CM

## 2016-05-14 DIAGNOSIS — K219 Gastro-esophageal reflux disease without esophagitis: Secondary | ICD-10-CM

## 2016-05-14 DIAGNOSIS — I161 Hypertensive emergency: Secondary | ICD-10-CM | POA: Diagnosis present

## 2016-05-14 DIAGNOSIS — E041 Nontoxic single thyroid nodule: Secondary | ICD-10-CM | POA: Diagnosis not present

## 2016-05-14 DIAGNOSIS — Z8546 Personal history of malignant neoplasm of prostate: Secondary | ICD-10-CM | POA: Diagnosis not present

## 2016-05-14 DIAGNOSIS — G629 Polyneuropathy, unspecified: Secondary | ICD-10-CM | POA: Diagnosis present

## 2016-05-14 DIAGNOSIS — I10 Essential (primary) hypertension: Secondary | ICD-10-CM | POA: Diagnosis not present

## 2016-05-14 DIAGNOSIS — I63521 Cerebral infarction due to unspecified occlusion or stenosis of right anterior cerebral artery: Secondary | ICD-10-CM

## 2016-05-14 DIAGNOSIS — I129 Hypertensive chronic kidney disease with stage 1 through stage 4 chronic kidney disease, or unspecified chronic kidney disease: Secondary | ICD-10-CM | POA: Diagnosis present

## 2016-05-14 DIAGNOSIS — G459 Transient cerebral ischemic attack, unspecified: Secondary | ICD-10-CM

## 2016-05-14 DIAGNOSIS — E042 Nontoxic multinodular goiter: Secondary | ICD-10-CM | POA: Diagnosis present

## 2016-05-14 DIAGNOSIS — E7800 Pure hypercholesterolemia, unspecified: Secondary | ICD-10-CM | POA: Diagnosis present

## 2016-05-14 DIAGNOSIS — Z87891 Personal history of nicotine dependence: Secondary | ICD-10-CM | POA: Diagnosis not present

## 2016-05-14 DIAGNOSIS — G8194 Hemiplegia, unspecified affecting left nondominant side: Secondary | ICD-10-CM | POA: Diagnosis present

## 2016-05-14 DIAGNOSIS — R51 Headache: Secondary | ICD-10-CM | POA: Diagnosis not present

## 2016-05-14 DIAGNOSIS — R471 Dysarthria and anarthria: Secondary | ICD-10-CM | POA: Diagnosis present

## 2016-05-14 LAB — LIPID PANEL
CHOL/HDL RATIO: 4.6 ratio
Cholesterol: 160 mg/dL (ref 0–200)
HDL: 35 mg/dL — AB (ref >40)
LDL Cholesterol: 106 mg/dL — ABNORMAL HIGH (ref 0–99)
TRIGLYCERIDES: 97 mg/dL (ref <150)
VLDL: 19 mg/dL (ref 0–40)

## 2016-05-14 MED ORDER — ACETAMINOPHEN 500 MG PO TABS
500.0000 mg | ORAL_TABLET | Freq: Four times a day (QID) | ORAL | Status: DC | PRN
  Administered 2016-05-14: 500 mg via ORAL
  Filled 2016-05-14: qty 1

## 2016-05-14 MED ORDER — IOPAMIDOL (ISOVUE-370) INJECTION 76%
INTRAVENOUS | Status: AC
  Administered 2016-05-14: 50 mL
  Filled 2016-05-14: qty 50

## 2016-05-14 MED ORDER — SIMVASTATIN 5 MG PO TABS: 10.0000 mg | ORAL_TABLET | Freq: Every day | ORAL | Status: DC

## 2016-05-14 MED ORDER — SODIUM CHLORIDE 0.9 % IV SOLN
INTRAVENOUS | Status: DC
  Administered 2016-05-14 – 2016-05-15 (×2): via INTRAVENOUS

## 2016-05-14 MED ORDER — STROKE: EARLY STAGES OF RECOVERY BOOK
Freq: Once | Status: DC
  Filled 2016-05-14: qty 1

## 2016-05-14 MED ORDER — METOPROLOL SUCCINATE ER 25 MG PO TB24
50.0000 mg | ORAL_TABLET | Freq: Every day | ORAL | Status: DC
  Administered 2016-05-14 – 2016-05-15 (×2): 50 mg via ORAL
  Filled 2016-05-14 (×2): qty 2

## 2016-05-14 MED ORDER — ENOXAPARIN SODIUM 40 MG/0.4ML ~~LOC~~ SOLN
40.0000 mg | SUBCUTANEOUS | Status: DC
  Administered 2016-05-14: 40 mg via SUBCUTANEOUS
  Filled 2016-05-14: qty 0.4

## 2016-05-14 MED ORDER — RIVAROXABAN 20 MG PO TABS
20.0000 mg | ORAL_TABLET | Freq: Every day | ORAL | Status: DC
  Administered 2016-05-14: 20 mg via ORAL
  Filled 2016-05-14: qty 1

## 2016-05-14 MED ORDER — ASPIRIN 81 MG PO CHEW
81.0000 mg | CHEWABLE_TABLET | Freq: Every day | ORAL | Status: DC
  Administered 2016-05-14: 81 mg via ORAL
  Filled 2016-05-14: qty 1

## 2016-05-14 MED ORDER — SIMVASTATIN 20 MG PO TABS
20.0000 mg | ORAL_TABLET | Freq: Every day | ORAL | Status: DC
  Administered 2016-05-14: 20 mg via ORAL
  Filled 2016-05-14: qty 1

## 2016-05-14 MED ORDER — PANTOPRAZOLE SODIUM 40 MG PO TBEC
40.0000 mg | DELAYED_RELEASE_TABLET | Freq: Every day | ORAL | Status: DC
  Administered 2016-05-14 – 2016-05-15 (×2): 40 mg via ORAL
  Filled 2016-05-14 (×2): qty 1

## 2016-05-14 NOTE — ED Notes (Signed)
Patient taken to MRI

## 2016-05-14 NOTE — H&P (Signed)
History and Physical    Elijah [name] [id number] DOB: 1940-11-16 DOA: 05/13/2016  Referring MD/NP/PA: Dr. Christy Gentles PCP: [name], MD  Patient coming from: PCP office  Chief Complaint: Headache and right-sided facial droop  HPI: Elijah [name] is a 75 y.o. male with medical history significant of HTN, HLD, chronic a. fib on aspirin, CKD stage III, peripheral neuropathy, prostate cancer; who presents with complaints of headache and right-sided facial droop. Patient was noted to last seen normal sometime around 2 PM his wife went shopping. When she returned at around 4 PM she noted right-sided facial droop and slurred speech. Associated symptoms included patient being altered during this time as well. Patient mainly complained of a right-sided aching headache at that time. Symptoms self resolve within one hou. He was evaluated by his primary care provider who advised him to come to the emergency department for further evaluation. Patient denies any chest pain, palpitations, shortness of breath, falls, or dysuria. Wife also notes previous history of similar symptoms back in May 2016, where the patient had frequent falls and again was not quite himself. He underwent extensive workup at that time which showed no acute cause.  ED Course: Upon admission into the emergency department patient was evaluated and seen to be afebrile, heart rates noted as high as 122, blood pressure up to 170/96, and all other vitals are relatively within normal limits. Lab work revealed BUN 24, creatinine 1.62. CT scan of the brain showed no acute abnormalities. [facility] called to admit.  Review of Systems: As per HPI otherwise 10 point review of systems negative.   Past Medical History:  Diagnosis Date  . Arthritis 09-10-11   rt. hip, knee, hands  . Atrial fibrillation ([place]) 09-10-11   Chronic A. Fib.-controls with med and Aspirin  . Cancer ([place]) 09-10-11   dx. Prostate cancer-bx. done 12'12, surgery planned  . Diverticulosis    . Hemorrhoid   . Hyperlipidemia   . Hypertension   . 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 etiology  . Peripheral neuropathy [facility])     Past Surgical History:  Procedure Laterality Date  . COLONOSCOPY  04/2003, 06/05/2011   diverticulosis, internal and external hemorrhoids 2004 and 2012, 2 small polyps 2012  . EYE SURGERY  09-10-11   Only stitches to close cut, and observation of hematoma from injury  . ROBOT ASSISTED LAPAROSCOPIC RADICAL PROSTATECTOMY  09/15/2011   Procedure: ROBOTIC ASSISTED LAPAROSCOPIC RADICAL PROSTATECTOMY LEVEL 2;  Surgeon: Dutch Gray, MD;  Location: [facility];  Service: Urology;  Laterality: N/A;        . TONSILLECTOMY       reports that he quit smoking about 47 years ago. He has never used smokeless tobacco. He reports that he does not drink alcohol or use drugs.  Allergies  Allergen Reactions  . Ciprofloxacin Nausea Only  . Flagyl [Metronidazole] Nausea Only    Family History  Problem Relation Age of Onset  . Diabetes Mother   . Hypertension Mother   . Diverticulosis Mother   . Heart disease Father 47    myoc infarction  . Colon cancer Neg Hx     Prior to Admission medications   Medication Sig Start Date End Date Taking? Authorizing Provider  acetaminophen (TYLENOL) 500 MG tablet Take 500 mg by mouth every 6 (six) hours as needed for mild pain.   Yes Historical Provider, MD  aspirin 81 MG tablet Take 81 mg by mouth daily.   Yes Historical Provider, MD  Cholecalciferol (  VITAMIN D PO) Take 1 tablet by mouth daily.   Yes Historical Provider, MD  metoprolol succinate (TOPROL-XL) 50 MG 24 hr tablet Take 1 tablet (50 mg total) by mouth daily. take 1 tablet by mouth once daily 03/17/16  Yes [name], MD  omeprazole (PRILOSEC) 20 MG capsule Take 1 capsule (20 mg total) by mouth daily. 02/07/16  Yes [name], MD  simvastatin (ZOCOR) 20 MG tablet Take 0.5 tablets (10 mg total) by mouth daily. 10/04/15  Yes [name], MD    Physical  Exam:  Constitutional: Elderly male in NAD, calm, comfortable Vitals:   05/13/16 1754 05/13/16 1755 05/13/16 2056  BP: 170/95  170/96  Pulse: (!) 122  97  Resp: 16  18  Temp: 98.8 F (37.1 C)  97.7 F (36.5 C)  TempSrc: Oral  Oral  SpO2: 100%  97%  Weight:  101.6 kg (224 lb)   Height:  6\' 1"  (1.854 m)    Eyes: PERRL, lids and conjunctivae normal ENMT: Mucous membranes are moist. Posterior pharynx clear of any exudate or lesions.Normal dentition.  Neck: normal, supple, no masses, no thyromegaly Respiratory: clear to auscultation bilaterally, no wheezing, no crackles. Normal respiratory effort. No accessory muscle use.  Cardiovascular: Irregular irregular, no murmurs / rubs / gallops. No extremity edema. 2+ pedal pulses. No carotid bruits.  Abdomen: no tenderness, no masses palpated. No hepatosplenomegaly. Bowel sounds positive.  Musculoskeletal: no clubbing / cyanosis. No joint deformity upper and lower extremities. Good ROM, no contractures. Normal muscle tone.  Skin: no rashes, lesions, ulcers. No induration Neurologic: CN 2-12 grossly intact. Sensation abnormal to the bilateral lower extremity, DTR normal. Strength 5/5 in all 4.  Psychiatric: Normal judgment and insight. Alert and oriented x 3. Normal mood.     Labs on Admission: I have personally reviewed following labs and imaging studies  CBC:  Recent Labs Lab 05/13/16 1810 05/13/16 1823  WBC 8.5  --   NEUTROABS 6.3  --   HGB 16.2 16.7  HCT 46.9 49.0  MCV 92.0  --   PLT 195  --    Basic Metabolic Panel:  Recent Labs Lab 05/13/16 1810 05/13/16 1823  NA 140 142  K 4.8 4.7  CL 107 106  CO2 26  --   GLUCOSE 112* 106*  BUN 24* 32*  CREATININE 1.62* 1.60*  CALCIUM 9.5  --    GFR: Estimated Creatinine Clearance: 50.8 mL/min (by C-G formula based on SCr of 1.6 mg/dL (H)). Liver Function Tests:  Recent Labs Lab 05/13/16 1810  AST 23  ALT 23  ALKPHOS 87  BILITOT 0.3  PROT 7.2  ALBUMIN 4.2   No  results for input(s): LIPASE, AMYLASE in the last 168 hours. No results for input(s): AMMONIA in the last 168 hours. Coagulation Profile:  Recent Labs Lab 05/13/16 1810  INR 1.01   Cardiac Enzymes: No results for input(s): CKTOTAL, CKMB, CKMBINDEX, TROPONINI in the last 168 hours. BNP (last 3 results) No results for input(s): PROBNP in the last 8760 hours. HbA1C: No results for input(s): HGBA1C in the last 72 hours. CBG:  Recent Labs Lab 05/13/16 1808  GLUCAP 106*   Lipid Profile: No results for input(s): CHOL, HDL, LDLCALC, TRIG, CHOLHDL, LDLDIRECT in the last 72 hours. Thyroid Function Tests: No results for input(s): TSH, T4TOTAL, FREET4, T3FREE, THYROIDAB in the last 72 hours. Anemia Panel: No results for input(s): VITAMINB12, FOLATE, FERRITIN, TIBC, IRON, RETICCTPCT in the last 72 hours. Urine analysis:  Component Value Date/Time   COLORURINE YELLOW 05/13/2016 1753   APPEARANCEUR HAZY (A) 05/13/2016 1753   LABSPEC 1.027 05/13/2016 1753   PHURINE 5.0 05/13/2016 1753   GLUCOSEU NEGATIVE 05/13/2016 1753   HGBUR NEGATIVE 05/13/2016 1753   HGBUR negative 08/30/2010 0801   BILIRUBINUR NEGATIVE 05/13/2016 1753   BILIRUBINUR n 10/18/2013 1156   KETONESUR NEGATIVE 05/13/2016 1753   PROTEINUR NEGATIVE 05/13/2016 1753   UROBILINOGEN 0.2 10/18/2013 1156   UROBILINOGEN 0.2 08/30/2010 0801   NITRITE NEGATIVE 05/13/2016 1753   LEUKOCYTESUR NEGATIVE 05/13/2016 1753   Sepsis Labs: No results found for this or any previous visit (from the past 240 hour(s)).   Radiological Exams on Admission: Ct Head Wo Contrast  Result Date: 05/13/2016 CLINICAL DATA:  Acute onset of facial droop, slurred speech and right-sided headache. Initial encounter. EXAM: CT HEAD WITHOUT CONTRAST TECHNIQUE: Contiguous axial images were obtained from the base of the skull through the vertex without intravenous contrast. COMPARISON:  MRI of the brain performed 12/22/2014 FINDINGS: Brain: No evidence of  acute infarction, hemorrhage, hydrocephalus, extra-axial collection or mass lesion/mass effect. Prominence of the ventricles and sulci suggests mild cortical volume loss. The posterior fossa, including the cerebellum, brainstem and fourth ventricle, is within normal limits. The basal ganglia are unremarkable in appearance. The cerebral hemispheres are symmetric in appearance, with normal gray-white differentiation. No mass effect or midline shift is seen. Vascular: No hyperdense vessel or unexpected calcification. Skull: There is no evidence of fracture; visualized osseous structures are unremarkable in appearance. Sinuses/Orbits: The orbits are within normal limits. The paranasal sinuses and mastoid air cells are well-aerated. Other: No significant soft tissue abnormalities are seen. IMPRESSION: 1. No acute intracranial pathology seen on CT. 2. Mild cortical volume loss noted. Electronically Signed   By: Garald Balding M.D.   On: 05/13/2016 20:21    EKG: Independently reviewed. Atrial fibrillation with RBBB similar  to previous tracings  Assessment/Plan TIA (transient ischemic attack): Symptoms now resolved patient back to baseline. No significant witness noticed - Admit to a telemetry bed - TIA/stroke order set initiated - Neuro checks - Check lipid panel and hemoglobin A1c in am - Check MRI of brian  - Follow-up MRI of brain and determine if further workup needed   Atrial fibrillation: CHADs VASc score = 2  Accounting for hypertension and age - Continue metoprolol and aspirin - Discussed possible options for possibly starting anticoagulation and I am  Chronic kidney disease stage III: Patient with worsening creatinine to 1.64 with a BUN of 24. Patient notes decreased oral intake due to history of prostate cancer and urinary incontinence. - IV fluids 100 ml/hr   - Check BMP in a.m.  History of prostate cancer  Hyperlipidemia - Continue simvastatin  GERD - Continue pharmacy substitution  of Protonix  DVT prophylaxis: Lovenox Code Status: Full Family Communication: Discussed plan of care with the patient and wife present at bedside. Disposition Plan: likely discharge home in a.m. Consults called: none Admission status: observation telemetry  Norval Morton MD [facility] Pager [phone]  If 7PM-7AM, please contact night-coverage [email or web address] Password [email or web address]  05/14/2016, 1:41 AM

## 2016-05-14 NOTE — ED Provider Notes (Signed)
Patient seen/examined in the Emergency Department in conjunction with Resident Physician Provider [facility] Patient presents with right sided facial droop and difficulty speaking several hrs ago, now back to baseline Exam : awake/alert, no facial droop, no arm drift.  Pt is in atrial fibrillation Plan: admit for TIA workup He will need anticoagulation for afib  tPA in stroke considered but not given due to: Symptoms resolved  D/w dr Fuller Plan for admission   This patients CHA2DS2-VASc Score and unadjusted Ischemic Stroke Rate (% per year) is equal to 2.2 % stroke rate/year from a score of 2  Above score calculated as 1 point each if present [CHF, HTN, DM, Vascular=MI/PAD/Aortic Plaque, Age if 65-74, or Male] Above score calculated as 2 points each if present [Age > 75, or Stroke/TIA/TE]      Ripley Fraise, MD 05/14/16 [phone]

## 2016-05-14 NOTE — ED Notes (Signed)
Pt offered a hospital bed and he did not want it

## 2016-05-14 NOTE — Progress Notes (Signed)
STROKE TEAM PROGRESS NOTE   HISTORY OF PRESENT ILLNESS (per record) This is a 75 year old right-handed man who was noted by his wife to have some facial weakness on the afternoon of 05/13/16. The patient himself was unaware of this and so most information is obtained from review of the medical records.  It appears that the patient's wife went shopping around 2 PM on the afternoon of 05/13/16 (LKW). When she returned home at approximately 4 PM, she feels as if the right side of the patient's mouth was drooping but also stated that he was drooling out of the left side. She noted that he had some possible slurred speech and that his right eye seemed to be nearly closed. She took the patient to his PCP where he was noted to have a normal neurological examination. However, due to concerns about possible TIA or stroke, he was sent to the emergency department for further evaluation. In the ED, MRI scan of the brain was obtained and showed acute ischemic changes in the right cerebral hemisphere. Neurology consultation has now been requested.  The patient's main concern is some pain and headache. He states that he had some moderate pain around the right eye yesterday afternoon. This has since resolved. He was not aware that he was having any facial weakness. He denies any weakness, numbness, slurred speech, difficulty expressing himself, vision changes, double vision, coordination problems, or balance disturbances. He has never had any prior history of focal neurological deficits. He has a known history of atrial fibrillation but has been resistant to going on anticoagulation and has been maintained on aspirin.  Patient was not administered IV t-PA secondary to delay in arrival. He was admitted for further evaluation and treatment.   SUBJECTIVE (INTERVAL HISTORY) Wife at bedside. He still has mild right facial droop but otherwise doing well. He is agreeable for DOAC for afib stroke prevention.     OBJECTIVE Temp:  [97.7 F (36.5 C)-99 F (37.2 C)] 99 F (37.2 C) (10/11 0900) Pulse Rate:  [60-122] 68 (10/11 0900) Cardiac Rhythm: Atrial fibrillation (10/11 0851) Resp:  [15-21] 20 (10/11 0900) BP: (158-183)/(86-118) 161/86 (10/11 0900) SpO2:  [92 %-100 %] 97 % (10/11 0900) Weight:  [224 lb (101.6 kg)] 224 lb (101.6 kg) (10/10 1755)  CBC:   Recent Labs Lab 05/13/16 1810 05/13/16 1823  WBC 8.5  --   NEUTROABS 6.3  --   HGB 16.2 16.7  HCT 46.9 49.0  MCV 92.0  --   PLT 195  --     Basic Metabolic Panel:   Recent Labs Lab 05/13/16 1810 05/13/16 1823  NA 140 142  K 4.8 4.7  CL 107 106  CO2 26  --   GLUCOSE 112* 106*  BUN 24* 32*  CREATININE 1.62* 1.60*  CALCIUM 9.5  --     Lipid Panel:     Component Value Date/Time   CHOL 160 05/14/2016 0517   TRIG 97 05/14/2016 0517   TRIG 143 06/11/2006 1031   HDL 35 (L) 05/14/2016 0517   CHOLHDL 4.6 05/14/2016 0517   VLDL 19 05/14/2016 0517   LDLCALC 106 (H) 05/14/2016 0517   HgbA1c: No results found for: HGBA1C Urine Drug Screen:     Component Value Date/Time   LABOPIA NONE DETECTED 05/13/2016 1753   COCAINSCRNUR NONE DETECTED 05/13/2016 1753   LABBENZ NONE DETECTED 05/13/2016 1753   AMPHETMU NONE DETECTED 05/13/2016 1753   THCU NONE DETECTED 05/13/2016 1753   LABBARB NONE DETECTED 05/13/2016 1753  IMAGING I have personally reviewed the radiological images below and agree with the radiology interpretations.  Ct Head Wo Contrast 05/13/2016 1. No acute intracranial pathology seen on CT. 2. Mild cortical volume loss noted.   Mr Brain Wo Contrast 05/14/2016 1. Diffusion restriction in the right posterior insula and right lateral temporal lobe with additional small punctate foci of right superolateral frontal lobe compatible with acute/early subacute infarction. The regions of ischemia demonstrate different ADC signal suggesting infarcts of differing ages. 2. 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 Mild chronic microvascular ischemic changes and mild parenchymal volume loss.   CTA head and neck No significant carotid or vertebral artery stenosis in the neck Occluded right M2 branch supplying the right parietal lobe in the area of acute infarction. There are well developed collateral vessels in this area. This could be due to an embolus or atherosclerotic stenosis. Thyroid goiter with 27 mm left thyroid nodule. Thyroid ultrasound Recommended.  TTE pending  Thyroid ultrasound pending   PHYSICAL EXAM  Temp:  [97.7 F (36.5 C)-99 F (37.2 C)] 99 F (37.2 C) (10/11 0900) Pulse Rate:  [60-122] 68 (10/11 0900) Resp:  [15-21] 20 (10/11 0900) BP: (158-183)/(86-118) 161/86 (10/11 0900) SpO2:  [92 %-100 %] 97 % (10/11 0900) Weight:  [224 lb (101.6 kg)] 224 lb (101.6 kg) (10/10 1755)  General - Well nourished, well developed, in no apparent distress.  Ophthalmologic - Sharp disc margins OU.   Cardiovascular - irregularly irregular heart rate and rhythm.  Mental Status -  Level of arousal and orientation to time, place, and person were intact. Language including expression, naming, repetition, comprehension was assessed and found intact. Attention span and concentration were normal. Recent and remote memory were 3/3 registration and 2/3 delayed recall. Fund of Knowledge was assessed and was intact  Cranial Nerves II - XII - II - Visual field intact OU. III, IV, VI - Extraocular movements intact. V - Facial sensation intact bilaterally. VII - right mild facial droop. VIII - Hearing & vestibular intact bilaterally. X - Palate elevates symmetrically. XI - Chin turning & shoulder shrug intact bilaterally. XII - Tongue protrusion intact.  Motor Strength - The patient's strength was normal in all extremities and pronator drift was absent.  Bulk was normal and fasciculations were absent.   Motor Tone - Muscle tone was assessed at the neck and appendages and was  normal.  Reflexes - The patient's reflexes were 1+ in all extremities and he had no pathological reflexes.  Sensory - Light touch, temperature/pinprick, vibration and proprioception, and Romberg testing were assessed and were symmetrical.    Coordination - The patient had normal movements in the hands and feet with no ataxia or dysmetria.  Tremor was absent.  Gait and Station - The patient's transfers, posture, gait, station, and turns were observed as normal.   ASSESSMENT/PLAN Mr. Elijah [name] is a 75 y.o. male with history of A-fib on ASA, HTN, HLD, peripheral neuropathy, CKD3, prostate cancer presenting with HA, R facial droop and dysarthria. He did not receive IV t-PA due to delay in arrival.   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 Yes; Fluid consistency: Thin  aspirin 81 mg daily prior to admission, now on aspirin 81 mg daily. Pt is agreeable for anticoagulation with DOAC, preferable Xarelto daily dosing.  Patient counseled to be compliant with his antithrombotic medications  Ongoing aggressive stroke risk factor management  Therapy recommendations:  pending  Disposition:  pending  Atrial Fibrillation  Home anticoagulation:  none, on aspirin   CHA2DS2-VASc Score = 4, ?2 oral anticoagulation recommended  Age in Years:  14-74   +1    Sex:  Male   0    Hypertension History:  yes   +1     Diabetes Mellitus:  0   Congestive Heart Failure History:  0  Vascular Disease History:  0     Stroke/TIA/Thromboembolism History:  yes   +2   pt is agreeable with DOAC.  Prefer Xarelto daily dosing     Hypertensive Emergency  Blood pressure as high as 183/118   Stabilizing this am  Permissive hypertension (OK if < 180/105) but gradually normalize in 5-7 days  Long-term BP goal normotensive  Hyperlipidemia  Home meds:  Zocor 10, resumed in hospital  LDL 106, goal < 70  Increase to zocor 20mg   Continue statin at discharge  Other Stroke Risk Factors  Advanced age  Former Cigarette smoker  Overweight, Body mass index is 29.55 kg/m., recommend weight loss, diet and exercise as appropriate   Other Active Problems  Chronic kidney disease stage III  Prostate cancer  GERD  Thyroid nodule - thyroid ultrasound pending  Hospital day # 0  Neurology will sign off. Please call with questions. Pt will follow up with Cecille Rubin NP at [facility] in about 6 weeks. Thanks for the consult.  Rosalin Hawking, MD PhD Stroke Neurology 05/14/2016 5:02 PM    To contact Stroke Continuity provider, please refer to [email or web address] After hours, contact General Neurology

## 2016-05-14 NOTE — Consult Note (Signed)
Neurology Consult Note  Reason for Consultation: Stroke on MRI  Requesting provider: Fuller Plan, MD  CC: None  HPI: This is a 75 year old right-handed man who was noted by his wife to have some facial weakness on the afternoon of 05/13/16. The patient himself was 1 unaware of this and so most information is obtained from review of the medical records.  It appears that the patient's wife went shopping around 2 PM on the afternoon of 05/13/16. When she returned home at approximately 4 PM, she feels as if the right side of the patient's mouth was drooping but also stated that he was drooling out of the left side. She noted that he had some possible slurred speech and that his right eye seemed to be nearly closed. She took the patient to his PCP where he was noted to have a normal neurological examination. However, due to concerns about possible TIA or stroke, he was sent to the emergency department for further evaluation. In the ED, MRI scan of the brain was obtained and showed acute ischemic changes in the right cerebral hemisphere. Neurology consultation has now been requested.  The patient's main concern is some pain and headache. He states that he had some moderate pain around the right eye yesterday afternoon. This has since resolved. He was not aware that he was having any facial weakness. He denies any weakness, numbness, slurred speech, difficulty expressing himself, vision changes, double vision, coordination problems, or balance disturbances. He has never had any prior history of focal neurological deficits. He has a known history of atrial fibrillation but has been resistant to going on anticoagulation and has been maintained on aspirin.  PMH:  Past Medical History:  Diagnosis Date  . Arthritis 09-10-11   rt. hip, knee, hands  . Atrial fibrillation ([place]) 09-10-11   Chronic A. Fib.-controls with med and Aspirin  . Cancer ([place]) 09-10-11   dx. Prostate cancer-bx. done 12'12, surgery planned   . Diverticulosis   . Hemorrhoid   . Hyperlipidemia   . Hypertension   . 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 etiology  . Peripheral neuropathy ([facility])     PSH:  Past Surgical History:  Procedure Laterality Date  . COLONOSCOPY  04/2003, 06/05/2011   diverticulosis, internal and external hemorrhoids 2004 and 2012, 2 small polyps 2012  . EYE SURGERY  09-10-11   Only stitches to close cut, and observation of hematoma from injury  . ROBOT ASSISTED LAPAROSCOPIC RADICAL PROSTATECTOMY  09/15/2011   Procedure: ROBOTIC ASSISTED LAPAROSCOPIC RADICAL PROSTATECTOMY LEVEL 2;  Surgeon: Dutch Gray, MD;  Location: [facility] ORS;  Service: Urology;  Laterality: N/A;        . TONSILLECTOMY      Family history: Family History  Problem Relation Age of Onset  . Diabetes Mother   . Hypertension Mother   . Diverticulosis Mother   . Heart disease Father 43    myoc infarction  . Colon cancer Neg Hx     Social history:  Social History   Social History  . Marital status: Married    Spouse name: N/A  . Number of children: 3  . Years of education: N/A   Occupational History  . Consultant Retired   Social History Main Topics  . Smoking status: Former Smoker    Quit date: 08/04/1968  . Smokeless tobacco: Never Used  . Alcohol use No     Comment: none in 20 yrs  . Drug use: No  . Sexual activity: Yes  Other Topics Concern  . Not on file   Social History Narrative   Married with 3 kids. 6 grandkids.       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       Hobbies: beach (place at [employer]) usually every 2 weeks.     Current outpatient meds: Current Meds  Medication Sig  . acetaminophen (TYLENOL) 500 MG tablet Take 500 mg by mouth every 6 (six) hours as needed for mild pain.  Marland Kitchen aspirin 81 MG tablet Take 81 mg by mouth daily.  . Cholecalciferol (VITAMIN D PO) Take 1 tablet by mouth daily.  . metoprolol succinate (TOPROL-XL) 50 MG 24 hr tablet Take 1 tablet (50 mg  total) by mouth daily. take 1 tablet by mouth once daily  . omeprazole (PRILOSEC) 20 MG capsule Take 1 capsule (20 mg total) by mouth daily.  . simvastatin (ZOCOR) 20 MG tablet Take 0.5 tablets (10 mg total) by mouth daily.    Current inpatient meds:  Current Facility-Administered Medications  Medication Dose Route Frequency Provider Last Rate Last Dose  .  stroke: mapping our early stages of recovery book   Does not apply Once Norval Morton, MD   Stopped at 05/14/16 [phone]  . 0.9 %  sodium chloride infusion   Intravenous Continuous Norval Morton, MD 100 mL/hr at 05/14/16 0401    . acetaminophen (TYLENOL) tablet 500 mg  500 mg Oral Q6H PRN Norval Morton, MD      . aspirin chewable tablet 81 mg  81 mg Oral Daily Rondell A Tamala Julian, MD      . enoxaparin (LOVENOX) injection 40 mg  40 mg Subcutaneous Q24H Rondell A Smith, MD      . metoprolol succinate (TOPROL-XL) 24 hr tablet 50 mg  50 mg Oral Daily Rondell A Tamala Julian, MD      . pantoprazole (PROTONIX) EC tablet 40 mg  40 mg Oral Daily Rondell A Tamala Julian, MD      . simvastatin (ZOCOR) tablet 10 mg  10 mg Oral Daily Norval Morton, MD       Current Outpatient Prescriptions  Medication Sig Dispense Refill  . acetaminophen (TYLENOL) 500 MG tablet Take 500 mg by mouth every 6 (six) hours as needed for mild pain.    Marland Kitchen aspirin 81 MG tablet Take 81 mg by mouth daily.    . Cholecalciferol (VITAMIN D PO) Take 1 tablet by mouth daily.    . metoprolol succinate (TOPROL-XL) 50 MG 24 hr tablet Take 1 tablet (50 mg total) by mouth daily. take 1 tablet by mouth once daily 30 tablet 5  . omeprazole (PRILOSEC) 20 MG capsule Take 1 capsule (20 mg total) by mouth daily. 30 capsule 5  . simvastatin (ZOCOR) 20 MG tablet Take 0.5 tablets (10 mg total) by mouth daily. 30 tablet 5    Allergies: Allergies  Allergen Reactions  . Ciprofloxacin Nausea Only  . Flagyl [Metronidazole] Nausea Only    ROS: As per HPI. A full 14-point review of systems was performed and is  otherwise unremarkable.   PE:  BP (!) 183/118   Pulse 63   Temp 97.7 F (36.5 C) (Oral)   Resp 17   Ht 6\' 1"  (1.854 m)   Wt 101.6 kg (224 lb)   SpO2 93%   BMI 29.55 kg/m   General: WDWN, no acute distress. AAO x4. Speech clear, no dysarthria. No aphasia. Follows commands briskly. Affect is bright with congruent mood.  Comportment is normal.  HEENT: Normocephalic. Neck supple without LAD. MMM, OP clear. Dentition good. Sclerae anicteric. Moderate bilateral conjunctival injection.  CV: Irregularly irregular, no murmur. Carotid pulses full and symmetric, no bruits. Distal pulses 2+ and symmetric.  Lungs: CTAB.  Abdomen: Soft, non-distended, non-tender. Bowel sounds present x4.  Extremities: No C/C/E. Neuro:  CN: Pupils are equal and round. They are symmetrically reactive from 3-->2 mm. EOMI without nystagmus. No reported diplopia. Facial sensation is intact to light touch. There is some widening of the left palpebral fissure but his face demonstrates normal strength and mobility. Hearing is intact to conversational voice. Palate elevates symmetrically and uvula is midline. Voice is normal in tone, pitch and quality. Bilateral SCM and trapezii are 5/5. Tongue is midline with normal bulk and mobility.  Motor: Normal bulk, tone, and strength with the exception of 4/5 finger extensors on the left and 4+/5 grip on the left. No tremor or other abnormal movements. No drift.  Sensation: Light touch and pinprick are slightly reduced in the left arm. He also appears to extinguish to double simultaneous stimulation on the left. He has a symmetric peripheral polyneuropathy to about the level of the ankles bilaterally. DTRs: 2+ on the right, 3+ in the left. Ankle jerks absent bilaterally. Toes downgoing on the right, mute on the left. No pathologic reflexes.  Coordination: Finger-to-nose and heel-to-shin are without dysmetria. Finger-to-nose is slower on the left. Finger taps are slower and more deliberate  on the left side.   Labs:  Lab Results  Component Value Date   WBC 8.5 05/13/2016   HGB 16.7 05/13/2016   HCT 49.0 05/13/2016   PLT 195 05/13/2016   GLUCOSE 106 (H) 05/13/2016   CHOL 161 12/04/2015   TRIG 126.0 12/04/2015   HDL 38.20 (L) 12/04/2015   LDLDIRECT 89.0 12/06/2014   LDLCALC 98 12/04/2015   ALT 23 05/13/2016   AST 23 05/13/2016   NA 142 05/13/2016   K 4.7 05/13/2016   CL 106 05/13/2016   CREATININE 1.60 (H) 05/13/2016   BUN 32 (H) 05/13/2016   CO2 26 05/13/2016   TSH 1.21 12/13/2014   PSA 0.01 (L) 10/18/2013   INR 1.01 05/13/2016   Troponin 0.00 Urine drug screen negative Urinalysis negative  Imaging:  I have personally and independently reviewed CT scan of the head without contrast from 05/13/16. This shows scattered areas of hypodensity in the bihemispheric white matter that are nonspecific in appearance, most consistent with chronic ischemic change.  I have personally and independently reviewed the MRI scan of the brain without contrast from 05/13/16. This shows patchy areas of restricted diffusion in the posterior lateral aspect of the right temporal lobe and the right temporoparietal junction. Additional small areas of restricted diffusion are noted in the posterior right insula and in the right frontal lobe. These are all consistent with acute ischemic infarction, most likely embolic in etiology. There is mild to moderate chronic small vessel ischemic change in the bihemispheric white matter. Mild generalized atrophy is also present.  Assessment and Plan:  1. Acute Ischemic Stroke: This is an acute stroke involving the right MCA territory. It is most likely cardioembolic in etiology given his underlying atrial fibrillation for which he has declined oral anticoagulation. However, a carotid source of embolus cannot be completely excluded based on the appearance of the stroke alone. Known risk factors for cerebrovascular disease in this patient include afib,  hypertension, hyperlipidemia, age. Additional workup will be ordered to include carotid Dopplers, TTE, fasting  lipids, and hemoglobin a1c. He has been taking aspirin up until this point. However, I have recommended that he strongly consider starting oral anticoagulation to optimize secondary prevention moving forward. He states that his primary care provider has been having this discussion with him for some time and he will consider this recommendation. Continue statin with goal LDL less than 70. Ensure adequate glucose control. Allow permissive hypertension in the acute phase, treating only SBP greater than 220 mmHg and/or DBP greater than 110 mmHg. Avoid fever and hyperglycemia as these can extend the infarct. Avoid hypotonic IVF to minimize exacerbation of post-stroke edema. Initiate rehab services. DVT prophylaxis as needed.   2. Left hemiparesis: He has very slight weakness in the left hand and left face, consistent with his ischemic stroke. OT to evaluate and treat left hand weakness.  3. Left hemisensory loss: He reports decreased sensation on the left side, specifically in the left arm. He also has mild extinction in the left arm as well. PT, OT as needed.  4. Headache: He has right-sided headache which is likely attributable to his stroke. Must exclude right internal carotid artery dissection given headache and right hemispheric embolic stroke. Symptomatic pain control as needed.  This was discussed with the patient and he is in agreement with the plan as noted. No family was present at the bedside the time of my visit.  Thank you for the opportunity to participate in this patient's care. Please feel free to call with any questions or concerns. The stroke team will assume care of the patient moving forward.

## 2016-05-14 NOTE — ED Notes (Signed)
Pt ambulatory and A&O x4 pt at baseline to get worked up for TIA. Pt in hospital bed.

## 2016-05-14 NOTE — Care Management Note (Signed)
Case Management Note  Patient Details  Name: Elijah [name] MRN: [id number] Date of Birth: Nov 21, 1940  Subjective/Objective:  Pt admitted with CVA. He is from home with his spouse.                Action/Plan: MD please order PT/OT if appropriate. CM following for d/c needs.   Expected Discharge Date:                  Expected Discharge Plan:     In-House Referral:     Discharge planning Services     Post Acute Care Choice:    Choice offered to:     DME Arranged:    DME Agency:     HH Arranged:    HH Agency:     Status of Service:  In process, will continue to follow  If discussed at Long Length of Stay Meetings, dates discussed:    Additional Comments:  Pollie Friar, RN 05/14/2016, 10:41 AM

## 2016-05-14 NOTE — Evaluation (Signed)
Physical Therapy Evaluation Patient Details Name: Elijah [name] MRN: [id number] DOB: 12-14-1940 Today's Date: 05/14/2016   History of Present Illness   Elijah [name] is a 75 y.o. male with medical history significant of HTN, HLD, chronic a. fib on aspirin, CKD stage III, peripheral neuropathy, prostate cancer; who presents with complaints of headache and right-sided facial droop.  Clinical Impression  *Pt admitted with above. Pt with history of bilat foot neuropathy contributing to pt's impaired balance and falls. Recommend outpt PT to address strength to help improve balance and response time to help prevent falls.    Follow Up Recommendations Outpatient PT;Supervision - Intermittent    Equipment Recommendations  None recommended by PT    Recommendations for Other Services       Precautions / Restrictions Precautions Precautions: Fall Precaution Comments: bilat foot neuropathy Restrictions Weight Bearing Restrictions: No      Mobility  Bed Mobility Overal bed mobility: Modified Independent             General bed mobility comments: used bed rail  Transfers Overall transfer level: Needs assistance Equipment used: None Transfers: Sit to/from Stand Sit to Stand: Supervision         General transfer comment: good technique, paused at end of stand to steady self  Ambulation/Gait Ambulation/Gait assistance: Supervision;Min guard Ambulation Distance (Feet): 150 Feet Assistive device: None Gait Pattern/deviations: Step-through pattern   Gait velocity interpretation: Below normal speed for age/gender General Gait Details: mildly unsteady however has had bilat foot neuropathy for 20 years  Stairs Stairs: Yes Stairs assistance: Min guard Stair Management: One rail Left;Alternating pattern Number of Stairs: 4 (limited by IV pole)    Wheelchair Mobility    Modified Rankin (Stroke Patients Only) Modified Rankin (Stroke Patients Only) Pre-Morbid Rankin Score: No  significant disability Modified Rankin: Slight disability     Balance                                 Standardized Balance Assessment Standardized Balance Assessment : Dynamic Gait Index   Dynamic Gait Index Level Surface: Mild Impairment Change in Gait Speed: Moderate Impairment Gait with Horizontal Head Turns: Mild Impairment Gait with Vertical Head Turns: Mild Impairment Gait and Pivot Turn: Mild Impairment Step Over Obstacle: Mild Impairment Step Around Obstacles: Mild Impairment Steps: Mild Impairment Total Score: 15       Pertinent Vitals/Pain Pain Assessment: No/denies pain    Home Living Family/patient expects to be discharged to:: Private residence Living Arrangements: Spouse/significant other Available Help at Discharge: Family;Available 24 hours/day Type of Home: House Home Access: Stairs to enter Entrance Stairs-Rails: None Entrance Stairs-Number of Steps: 2 Home Layout: Two level Home Equipment: None      Prior Function Level of Independence: Independent         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, newspaper   Dominant Hand: Right    Extremity/Trunk Assessment   Upper Extremity Assessment: LUE deficits/detail       LUE Deficits / Details: grossly 4/5   Lower Extremity Assessment: RLE deficits/detail;LLE deficits/detail RLE Deficits / Details: h/o neuropathy LLE Deficits / Details: h/o neuropathy  Cervical / Trunk Assessment: Normal  Communication   Communication: No difficulties  Cognition Arousal/Alertness: Awake/alert Behavior During Therapy: WFL for tasks assessed/performed Overall Cognitive Status: Within Functional Limits for tasks assessed  General Comments      Exercises     Assessment/Plan    PT Assessment Patient needs continued PT services  PT Problem List Decreased strength;Decreased range of motion;Decreased activity tolerance;Decreased balance;Decreased mobility;Decreased  coordination          PT Treatment Interventions DME instruction;Gait training;Stair training;Functional mobility training;Therapeutic activities;Therapeutic exercise;Balance training    PT Goals (Current goals can be found in the Care Plan section)  Acute Rehab PT Goals Patient Stated Goal: home PT Goal Formulation: With patient Time For Goal Achievement: 05/21/16 Potential to Achieve Goals: Good Additional Goals Additional Goal #1: Pt to score >19 on DGI to indicate minimal falls risk.    Frequency Min 3X/week   Barriers to discharge        Co-evaluation               End of Session Equipment Utilized During Treatment: Gait belt Activity Tolerance: Patient tolerated treatment well Patient left: in bed;with call bell/phone within reach;with family/visitor present Nurse Communication: Mobility status    Functional Assessment Tool Used: clinical jdugement Functional Limitation: Mobility: Walking and moving around Mobility: Walking and Moving Around Current Status [id number]): At least 20 percent but less than 40 percent impaired, limited or restricted Mobility: Walking and Moving Around Goal Status [phone]): At least 1 percent but less than 20 percent impaired, limited or restricted    Time: 1600-1620 PT Time Calculation (min) (ACUTE ONLY): 20 min   Charges:   PT Evaluation $PT Eval Moderate Complexity: 1 Procedure     PT G Codes:   PT G-Codes **NOT FOR INPATIENT CLASS** Functional Assessment Tool Used: clinical jdugement Functional Limitation: Mobility: Walking and moving around Mobility: Walking and Moving Around Current Status [id number]): At least 20 percent but less than 40 percent impaired, limited or restricted Mobility: Walking and Moving Around Goal Status [phone]): At least 1 percent but less than 20 percent impaired, limited or restricted    Kingsley Callander 05/14/2016, 4:28 PM   Kittie Plater, PT, DPT Pager #: [phone] Office #: [phone]

## 2016-05-14 NOTE — Progress Notes (Signed)
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 Has Hx of HTN, HLD, CKD Stage 3, Hx of Prostate Cancer, GERD, and Atrial Fibrillation. Agreeable to Start Anticoagulation with Xarelto tonight. Neurology Stroke Team Dr. Erlinda Hong following. 2D Echo Pending and Thyroid U/S ordered by Neurology for Thyroid Nodule pending. PT recommends Outpatient Pt. Continue to Monitor and appreciate additional Neurology Recc's

## 2016-05-15 ENCOUNTER — Inpatient Hospital Stay (HOSPITAL_COMMUNITY): Payer: Medicare Other

## 2016-05-15 DIAGNOSIS — I6789 Other cerebrovascular disease: Secondary | ICD-10-CM

## 2016-05-15 DIAGNOSIS — E042 Nontoxic multinodular goiter: Secondary | ICD-10-CM

## 2016-05-15 DIAGNOSIS — I639 Cerebral infarction, unspecified: Secondary | ICD-10-CM

## 2016-05-15 LAB — COMPREHENSIVE METABOLIC PANEL
ALK PHOS: 72 U/L (ref 38–126)
ALT: 20 U/L (ref 17–63)
ANION GAP: 7 (ref 5–15)
AST: 19 U/L (ref 15–41)
Albumin: 3.5 g/dL (ref 3.5–5.0)
BILIRUBIN TOTAL: 0.7 mg/dL (ref 0.3–1.2)
BUN: 20 mg/dL (ref 6–20)
CALCIUM: 8.7 mg/dL — AB (ref 8.9–10.3)
CO2: 22 mmol/L (ref 22–32)
Chloride: 110 mmol/L (ref 101–111)
Creatinine, Ser: 1.4 mg/dL — ABNORMAL HIGH (ref 0.61–1.24)
GFR, EST AFRICAN AMERICAN: 56 mL/min — AB (ref >60)
GFR, EST NON AFRICAN AMERICAN: 48 mL/min — AB (ref >60)
GLUCOSE: 94 mg/dL (ref 65–99)
POTASSIUM: 4 mmol/L (ref 3.5–5.1)
Sodium: 139 mmol/L (ref 135–145)
TOTAL PROTEIN: 5.9 g/dL — AB (ref 6.5–8.1)

## 2016-05-15 LAB — CBC WITH DIFFERENTIAL/PLATELET
BASOS PCT: 1 %
Basophils Absolute: 0.1 10*3/uL (ref 0.0–0.1)
Eosinophils Absolute: 0.3 10*3/uL (ref 0.0–0.7)
Eosinophils Relative: 3 %
HEMATOCRIT: 44.6 % (ref 39.0–52.0)
HEMOGLOBIN: 15.2 g/dL (ref 13.0–17.0)
LYMPHS ABS: 1.7 10*3/uL (ref 0.7–4.0)
Lymphocytes Relative: 20 %
MCH: 31 pg (ref 26.0–34.0)
MCHC: 34.1 g/dL (ref 30.0–36.0)
MCV: 90.8 fL (ref 78.0–100.0)
MONO ABS: 0.7 10*3/uL (ref 0.1–1.0)
MONOS PCT: 8 %
NEUTROS ABS: 5.7 10*3/uL (ref 1.7–7.7)
NEUTROS PCT: 68 %
Platelets: 184 10*3/uL (ref 150–400)
RBC: 4.91 MIL/uL (ref 4.22–5.81)
RDW: 13.2 % (ref 11.5–15.5)
WBC: 8.4 10*3/uL (ref 4.0–10.5)

## 2016-05-15 LAB — ECHOCARDIOGRAM COMPLETE
HEIGHTINCHES: 73 in
WEIGHTICAEL: 3584 [oz_av]

## 2016-05-15 LAB — HEMOGLOBIN A1C
HEMOGLOBIN A1C: 5.9 % — AB (ref 4.8–5.6)
Mean Plasma Glucose: 123 mg/dL

## 2016-05-15 LAB — MAGNESIUM: MAGNESIUM: 2 mg/dL (ref 1.7–2.4)

## 2016-05-15 LAB — PHOSPHORUS: PHOSPHORUS: 3.4 mg/dL (ref 2.5–4.6)

## 2016-05-15 MED ORDER — STROKE: EARLY STAGES OF RECOVERY BOOK: 1.0000 | Freq: Once | 0 refills | Status: AC

## 2016-05-15 MED ORDER — RIVAROXABAN 20 MG PO TABS: 20.0000 mg | ORAL_TABLET | Freq: Every day | ORAL | 0 refills | Status: DC

## 2016-05-15 NOTE — Progress Notes (Signed)
  Echocardiogram 2D Echocardiogram has been performed.  Elijah [name] 05/15/2016, 9:15 AM

## 2016-05-15 NOTE — Care Management Note (Signed)
Case Management Note  Patient Details  Name: Rodnie Middendorf MRN: [id number] Date of Birth: 17-Dec-1940  Subjective/Objective:                    Action/Plan: Pt discharging home with self care and wife. Pt discharging on Xarelto daily. CM provided him the 30 day free card and informed him to check on the cost of the Xarelto with his pharmacist when filling the first 30 days. Also informed him to f/u with his PCP or cardiologist at next visit if cost is too expensive. Pt and wife voiced understanding. PT recommending outpatient therapy. Patient refusing at this time but states he will f/u with his PCP if he changes his mind. Will update the bedside RN.   Expected Discharge Date:                  Expected Discharge Plan:  Home/Self Care  In-House Referral:     Discharge planning Services  CM Consult  Post Acute Care Choice:    Choice offered to:     DME Arranged:    DME Agency:     HH Arranged:    [place] Agency:     Status of Service:  Completed, signed off  If discussed at [employer] of Stay Meetings, dates discussed:    Additional Comments:  Pollie Friar, RN 05/15/2016, 2:28 PM

## 2016-05-15 NOTE — Discharge Summary (Signed)
Physician Discharge Summary  Elijah [name] HYW:[id number] DOB: May 10, 1941 DOA: 05/13/2016  PCP: Elijah Reddish, MD  Admit date: 05/13/2016 Discharge date: 05/15/2016  Admitted From: Home Disposition:  Home  Recommendations for Outpatient Follow-up:  1. Follow up with PCP Dr. Yong [name] in 1-2 weeks' 2. Follow up with Neurology Elijah [name] in 6 weeks 3. Follow up with Cardiology in 1 week 4. Obtain U/S Biopsy of Thyroid Nodule and TSH and Free T4 5. Please obtain BMP/CBC in one week  Home Health: No Equipment/Devices: None  Discharge Condition: Stable CODE STATUS: Full Diet recommendation: Heart Healthy   Brief/Interim Summary: Elijah [name] is a 75 y.o. male with medical history significant of HTN, HLD, chronic a. fib on aspirin, CKD stage III, peripheral neuropathy, prostate cancer; who presents with complaints of headache and right-sided facial droop. Patient was noted to last seen normal sometime around 2 PM his wife went shopping. When she returned at around 4 PM she noted right-sided facial droop and slurred speech. Associated symptoms included patient being altered during this time as well. Patient mainly complained of a right-sided aching headache at that time. Symptoms resolved within one hour. He was evaluated by his primary care provider who advised him to come to the emergency department for further evaluation. He was found to have Multiple Right Sided CVA's and admitted for further workup. He was worked up and placed on Xarelto because it appeared to be an embolic phenomenon from Atrial Fibrillation. He was medically stable to be D/C'd and will follow up with PCP.   Discharge Diagnoses:  Principal Problem:   TIA (transient ischemic attack) Active Problems:   Essential hypertension   Atrial fibrillation (HCC)   CKD (chronic kidney disease), stage III   GERD (gastroesophageal reflux disease)   Stroke (HCC)   Thyroid nodule   Pure hypercholesterolemia   Acute CVA (cerebrovascular  accident) ([place])  1. 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 Atrial Fibrllation -CHADSVASC is 4.  -On Anticoagulation with Xarelto Now -C/w Metoprolol Succinate -Follow up with Cardiology -ECHO Done in Hospital  3. HTN -Allowed for Permissive HTN -C/w Metoprolol Succinate -Will need to have another Agent Added by PCP as an outpatient. To Discuss with Dr. Yong [name]  4. Hyperlipidemia -Continue Simvastatin  5. Pre-Diabetes -Dietary Modifications -HbA1c was 5.9  6. Multinodular Goiter -TSH and Free T4 not done as an inpatient (last TSH was 12/13/14 and was 1.21). Will need as outpatient.  -Thyroid U/S Done: Multi nodular goiter.The dominant nodule in the superior left thyroid lobe meets criteria for ultrasound-guided biopsy.Additional nodules meet requirement for 1 year follow-up as described. -Outpatient U/S Guided Biopsy  7. CKD Stage 3 -EGFR was 48 -Avoid Nephrotoxic Medications -No ACE or ARB because of CKD. Discuss Antihypertensives with PCP  8. GERD -C/w Omeprazole  Discharge Instructions  Discharge Instructions    Ambulatory referral to Neurology    Complete by:  As directed    Follow up with NP Elijah [name] at [facility] in about two months. Thanks.   Call MD for:  difficulty breathing, headache or visual disturbances    Complete by:  As directed    Call MD for:  persistant dizziness or light-headedness    Complete by:  As directed    Call  MD for:  persistant nausea and vomiting    Complete by:  As directed    Diet - low sodium heart healthy    Complete by:  As directed    Discharge instructions    Complete by:  As directed    Follow up with PCP, Cardiology, and Neurology. Be sure to get outpatient PT and  outpatient biopsy of Thyroid Nodule. Take all medication as prescribed. If symptoms change or worsen return to ER or PCP for Evaluation.   Increase activity slowly    Complete by:  As directed    No wound care    Complete by:  As directed        Medication List    STOP taking these medications   aspirin 81 MG tablet     TAKE these medications    stroke: mapping our early stages of recovery book Misc 1 each by Does not apply route once.   acetaminophen 500 MG tablet Commonly known as:  TYLENOL Take 500 mg by mouth every 6 (six) hours as needed for mild pain.   metoprolol succinate 50 MG 24 hr tablet Commonly known as:  TOPROL-XL Take 1 tablet (50 mg total) by mouth daily. take 1 tablet by mouth once daily   omeprazole 20 MG capsule Commonly known as:  PRILOSEC Take 1 capsule (20 mg total) by mouth daily.   rivaroxaban 20 MG Tabs tablet Commonly known as:  XARELTO Take 1 tablet (20 mg total) by mouth daily with supper.   simvastatin 20 MG tablet Commonly known as:  ZOCOR Take 0.5 tablets (10 mg total) by mouth daily.   VITAMIN D PO Take 1 tablet by mouth daily.      Follow-up Information    Elijah Bible, NP. Schedule an appointment as soon as possible for a visit in 6 weeks.   Specialty:  Family Medicine Why:  Per Remo Lipps, Pt or  family needs to make appt. Contact information: [place] [address] [phone]        Elijah Reddish, MD In 1 week.   Specialty:  Family Medicine Why:  Follow up with PCP  Contact information: [place] [address] [phone]        Elijah Grayer, MD In 1 week.   Specialty:  Cardiology Why:  Follow up for Atrial Fibrillation Contact information: [address] [place] [address] [phone]          Allergies  Allergen Reactions  . Ciprofloxacin Nausea Only  . Flagyl [Metronidazole] Nausea Only    Consultations: Neurology Stroke  Team  Procedures/Studies: Ct Angio Head W Or Wo Contrast  Result Date: 05/14/2016 CLINICAL DATA:  Stroke EXAM: CT ANGIOGRAPHY HEAD AND NECK TECHNIQUE: Multidetector CT imaging of the head and neck was performed using the standard protocol during bolus administration of intravenous contrast. Multiplanar CT image reconstructions and MIPs were obtained to evaluate the vascular anatomy. Carotid stenosis measurements (when applicable) are obtained utilizing NASCET criteria, using the distal internal carotid diameter as the denominator. CONTRAST:  50 mL Isovue 370 IV COMPARISON:  MRI head 05/14/2016 FINDINGS: CTA NECK FINDINGS Aortic arch: Mild atherosclerotic disease in the aortic arch. 4 vessel aortic arch. Left vertebral artery origin from the aortic arch, a normal variant. Negative for aortic aneurysm or dissection. Right carotid system: Right common carotid artery widely patent. Minimal atherosclerotic disease at the carotid bifurcation without significant stenosis. Left carotid system: Left common carotid artery widely patent.  Left carotid bifurcation widely patent without significant atherosclerotic disease. Vertebral arteries: Left vertebral artery origin from the arch. Right vertebral artery dominant. Both vertebral arteries patent to the basilar without stenosis. Skeleton: Cervical spine disc and facet degeneration. No acute skeletal abnormality. Other neck: Bilateral thyroid enlargement with multiple nodules. Largest nodule in the left lobe measures 27 mm in diameter. Upper chest: Lung apices clear. Review of the MIP images confirms the above findings CTA HEAD FINDINGS Anterior circulation: Right cavernous carotid widely patent with minimal atherosclerotic disease. Right M1 segment is patent. There is occlusion of parietal branch of the right M2 segment. This corresponds with acute infarct in the right temporoparietal lobe. There appear to be fairly well developed collateral vessels. Right anterior  cerebral artery widely patent Left cavernous carotid widely patent with mild atherosclerotic calcification. Left anterior and middle cerebral arteries patent without stenosis or occlusion. Posterior circulation: Both vertebral arteries patent to the basilar without significant stenosis. PICA patent bilaterally. Basilar widely patent. Superior cerebellar and posterior cerebral arteries patent bilaterally. Venous sinuses: Patent Anatomic variants: None Delayed phase: Normal enhancement on delayed imaging. No mass lesion. Review of the MIP images confirms the above findings IMPRESSION: No significant carotid or vertebral artery stenosis in the neck Occluded right M2 branch supplying the right parietal lobe in the area of acute infarction. There are well developed collateral vessels in this area. This could be due to an embolus or atherosclerotic stenosis. Thyroid goiter with 27 mm left thyroid nodule. Thyroid ultrasound recommended. Electronically Signed   By: Franchot Gallo M.D.   On: 05/14/2016 11:49   Ct Head Wo Contrast  Result Date: 05/13/2016 CLINICAL DATA:  Acute onset of facial droop, slurred speech and right-sided headache. Initial encounter. EXAM: CT HEAD WITHOUT CONTRAST TECHNIQUE: Contiguous axial images were obtained from the base of the skull through the vertex without intravenous contrast. COMPARISON:  MRI of the brain performed 12/22/2014 FINDINGS: Brain: No evidence of acute infarction, hemorrhage, hydrocephalus, extra-axial collection or mass lesion/mass effect. Prominence of the ventricles and sulci suggests mild cortical volume loss. The posterior fossa, including the cerebellum, brainstem and fourth ventricle, is within normal limits. The basal ganglia are unremarkable in appearance. The cerebral hemispheres are symmetric in appearance, with normal gray-white differentiation. No mass effect or midline shift is seen. Vascular: No hyperdense vessel or unexpected calcification. Skull: There is no  evidence of fracture; visualized osseous structures are unremarkable in appearance. Sinuses/Orbits: The orbits are within normal limits. The paranasal sinuses and mastoid air cells are well-aerated. Other: No significant soft tissue abnormalities are seen. IMPRESSION: 1. No acute intracranial pathology seen on CT. 2. Mild cortical volume loss noted. Electronically Signed   By: Garald Balding M.D.   On: 05/13/2016 20:21   Ct Angio Neck W Or Wo Contrast  Result Date: 05/14/2016 CLINICAL DATA:  Stroke EXAM: CT ANGIOGRAPHY HEAD AND NECK TECHNIQUE: Multidetector CT imaging of the head and neck was performed using the standard protocol during bolus administration of intravenous contrast. Multiplanar CT image reconstructions and MIPs were obtained to evaluate the vascular anatomy. Carotid stenosis measurements (when applicable) are obtained utilizing NASCET criteria, using the distal internal carotid diameter as the denominator. CONTRAST:  50 mL Isovue 370 IV COMPARISON:  MRI head 05/14/2016 FINDINGS: CTA NECK FINDINGS Aortic arch: Mild atherosclerotic disease in the aortic arch. 4 vessel aortic arch. Left vertebral artery origin from the aortic arch, a normal variant. Negative for aortic aneurysm or dissection. Right carotid system: Right common carotid artery widely patent.  Minimal atherosclerotic disease at the carotid bifurcation without significant stenosis. Left carotid system: Left common carotid artery widely patent. Left carotid bifurcation widely patent without significant atherosclerotic disease. Vertebral arteries: Left vertebral artery origin from the arch. Right vertebral artery dominant. Both vertebral arteries patent to the basilar without stenosis. Skeleton: Cervical spine disc and facet degeneration. No acute skeletal abnormality. Other neck: Bilateral thyroid enlargement with multiple nodules. Largest nodule in the left lobe measures 27 mm in diameter. Upper chest: Lung apices clear. Review of the  MIP images confirms the above findings CTA HEAD FINDINGS Anterior circulation: Right cavernous carotid widely patent with minimal atherosclerotic disease. Right M1 segment is patent. There is occlusion of parietal branch of the right M2 segment. This corresponds with acute infarct in the right temporoparietal lobe. There appear to be fairly well developed collateral vessels. Right anterior cerebral artery widely patent Left cavernous carotid widely patent with mild atherosclerotic calcification. Left anterior and middle cerebral arteries patent without stenosis or occlusion. Posterior circulation: Both vertebral arteries patent to the basilar without significant stenosis. PICA patent bilaterally. Basilar widely patent. Superior cerebellar and posterior cerebral arteries patent bilaterally. Venous sinuses: Patent Anatomic variants: None Delayed phase: Normal enhancement on delayed imaging. No mass lesion. Review of the MIP images confirms the above findings IMPRESSION: No significant carotid or vertebral artery stenosis in the neck Occluded right M2 branch supplying the right parietal lobe in the area of acute infarction. There are well developed collateral vessels in this area. This could be due to an embolus or atherosclerotic stenosis. Thyroid goiter with 27 mm left thyroid nodule. Thyroid ultrasound recommended. Electronically Signed   By: Franchot Gallo M.D.   On: 05/14/2016 11:49   Mr Brain Wo Contrast  Result Date: 05/14/2016 CLINICAL DATA:  75 y/o  M; headache and right-sided facial droop. EXAM: MRI HEAD WITHOUT CONTRAST TECHNIQUE: Multiplanar, multiecho pulse sequences of the brain and surrounding structures were obtained without intravenous contrast. COMPARISON:  12/22/2015 MRI of the brain.  05/13/2016 CT head FINDINGS: Brain: Partially empty sella turcica. Small focus of diffusion restriction in the right superolateral frontal lobe and right posterior insula. Diffusion hyperintensity with mildly  decreased ADC in the right lateral temporal lobe also noted. No additional region of diffusion restriction. Background of scattered nonspecific foci of T2 FLAIR hyperintensity in subcortical and periventricular white matter consistent with mild chronic microvascular ischemic changes. Mild parenchymal volume loss. Susceptibility hypointensity in the right insula with mild encephalomalacia anterior to the current region of acute ischemia is consistent with hemosiderin deposition from prior infarction. Additional small chronic lacunar infarct within the left cerebellar hemisphere. Vascular: Normal prox flow voids. Skull and upper cervical spine: Normal marrow signal. Sinuses/Orbits: Mild paranasal sinus mucosal thickening of the ethmoid air cells, otherwise no abnormal signal of paranasal sinuses. No abnormal signal of mastoid air cells. Orbits are unremarkable Other: None IMPRESSION: 1. Diffusion restriction in the right posterior insula and right lateral temporal lobe with additional small punctate foci of right superolateral frontal lobe compatible with acute/early subacute infarction. The regions of ischemia demonstrate different ADC signal suggesting infarcts of differing ages. 2. Chronic right insula and left cerebellar lacunar infarcts. Mild chronic microvascular ischemic changes and mild parenchymal volume loss. These results were called by telephone at the time of interpretation on 05/14/2016 at 4:03 am to Dr. Christy Gentles, who verbally acknowledged these results. Electronically Signed   By: Kristine Garbe M.D.   On: 05/14/2016 04:05   US Thyroid  Result Date: 05/15/2016 CLINICAL DATA:  Incidental on CT.  Abnormal thyroid on CT. EXAM: THYROID ULTRASOUND TECHNIQUE: Ultrasound examination of the thyroid gland and adjacent soft tissues was performed. COMPARISON:  05/14/2016 FINDINGS: Parenchymal Echotexture: Moderately heterogenous Estimated total number of nodules >/= 1 cm: 6-10 Number of spongiform  nodules >/=  2 cm not described below (TR1): 0 Number of mixed cystic and solid nodules >/= 1.5 cm not described below (TR2): 0 _________________________________________________________ Isthmus: Measures 0.7 cm in thickness. No discrete nodules are identified within the thyroid isthmus. _________________________________________________________ Right lobe: Measures 4.8 x 2.4 x 3.4 cm. Nodule # 1: Location: Right; Mid Size: 2.0 x 1.9 x 1.9 cm Composition: solid/almost completely solid (2) Echogenicity: isoechoic (1) Shape: not taller-than-wide (0) Margins: ill-defined (0) Echogenic foci: none (0) ACR TI-RADS total points: 3. ACR TI-RADS risk category: TR3 (3 points). ACR TI-RADS recommendations: *Given size (>/= 1.5 - 2.4 cm) and appearance, a follow-up ultrasound in 1 year should be considered based on TI-RADS criteria. Nodule # 2: Location: Right; Mid Size: 1.1 x 0.8 x 1.2 cm. Composition: solid/almost completely solid (2) Echogenicity: isoechoic (1) Shape: not taller-than-wide (0) Margins: ill-defined (0) Echogenic foci: large comet-tail artifacts (0) ACR TI-RADS total points: 3. ACR TI-RADS risk category: TR3 (3 points). ACR TI-RADS recommendations: Given size (<1.4 cm) and appearance, this nodule does NOT meet TI-RADS criteria for biopsy or dedicated follow-up. There is an additional heterogeneous nodule in the inferior right thyroid lobe measuring up to 1.2 cm. This appears to be a mixed solid and cystic nodule. _________________________________________________________ Left lobe: Measures 5.8 x 4.1 x 4.3 cm. Nodule # 3: Location: Left; Superior Size: Measures 3.7 x 2.0 x 4.3 cm Composition: solid/almost completely solid (2) Echogenicity: isoechoic (1) Shape: not taller-than-wide (0) Margins: ill-defined (0) Echogenic foci: none (0) ACR TI-RADS total points: 3. ACR TI-RADS risk category: TR3 (3 points). ACR TI-RADS recommendations: **Given size (>/= 2.5 cm) and appearance, fine needle aspiration of this mildly  suspicious nodule should be considered based on TI-RADS criteria. Nodule # 4: Location: Left; Inferior Size: 1.9 x 1.2 x 1.7 cm. Composition: solid/almost completely solid (2) Echogenicity: isoechoic (1) Shape: not taller-than-wide (0) Margins: ill-defined (0) Echogenic foci: none (0) ACR TI-RADS total points: 3. ACR TI-RADS risk category: TR3 (3 points). ACR TI-RADS recommendations: *Given size (>/= 1.5 - 2.4 cm) and appearance, a follow-up ultrasound in 1 year should be considered based on TI-RADS criteria. There is a predominantly cystic nodule in the superior left thyroid lobe measuring 1.4 cm. IMPRESSION: Multi nodular goiter. The dominant nodule in the superior left thyroid lobe meets criteria for ultrasound-guided biopsy. Additional nodules meet requirement for 1 year follow-up as described. The above is in keeping with the ACR TI-RADS recommendations - J Am Coll Radiol 2017;14:587-595. Electronically Signed   By: Markus Daft M.D.   On: 05/15/2016 08:09    ECHOCARDIOGRAM Study Conclusions  - Left ventricle: The cavity size was normal. Wall thickness was   increased in a pattern of mild LVH. Systolic function was normal.   The estimated ejection fraction was in the range of 55% to 60%.   Wall motion was normal; there were no regional wall motion   abnormalities. - Mitral valve: Calcified annulus. There was mild regurgitation. - Left atrium: The atrium was severely dilated. - Right ventricle: The cavity size was mildly dilated. - Right atrium: The atrium was severely dilated.  Impressions:  - Normal LV systolic function; mild LVH; severe biatrial   enlargement; mild RVE; mild MR; trace TR.  Subjective: Patient  was seen and examined at bedside and was doing well. No real complaints and no N/V/Abdominal Pain, lightheadedness or dizziness. No CP or SOB. Doing well and ready to go home.   Discharge Exam: Vitals:   05/15/16 0445 05/15/16 0932  BP: (!) 167/94 136/78  Pulse: 66 (!) 58   Resp: 20   Temp: 98.1 F (36.7 C)    Vitals:   05/14/16 2100 05/15/16 0030 05/15/16 0445 05/15/16 0932  BP: 132/88 130/79 (!) 167/94 136/78  Pulse: (!) 57 (!) 59 66 (!) 58  Resp: _0 Temp: 98.5 F (36.9 C) 98.3 F (36.8 C) 98.1 F (36.7 C)   TempSrc: Oral Oral Oral   SpO2: 97% 98% 97%   Weight:      Height:        General: Pt is alert, awake, not in acute distress Cardiovascular: RRR, S1/S2 +, no rubs, no gallops Respiratory: CTA bilaterally, no wheezing, no rhonchi Abdominal: Soft, NT, ND, bowel sounds + Extremities: no edema, no cyanosis  The results of significant diagnostics from this hospitalization (including imaging, microbiology, ancillary and laboratory) are listed below for reference.    Microbiology: No results found for this or any previous visit (from the past 240 hour(s)).   Labs: BNP (last 3 results) No results for input(s): BNP in the last 8760 hours. Basic Metabolic Panel:  Recent Labs Lab 05/13/16 1810 05/13/16 1823 05/15/16 0331  NA 140 142 139  K 4.8 4.7 4.0  CL 107 106 110  CO2 26  --  22  GLUCOSE 112* 106* 94  BUN 24* 32* 20  CREATININE 1.62* 1.60* 1.40*  CALCIUM 9.5  --  8.7*  MG  --   --  2.0  PHOS  --   --  3.4   Liver Function Tests:  Recent Labs Lab 05/13/16 1810 05/15/16 0331  AST 23 19  ALT 23 20  ALKPHOS 87 72  BILITOT 0.3 0.7  PROT 7.2 5.9*  ALBUMIN 4.2 3.5   No results for input(s): LIPASE, AMYLASE in the last 168 hours. No results for input(s): AMMONIA in the last 168 hours. CBC:  Recent Labs Lab 05/13/16 1810 05/13/16 1823 05/15/16 0331  WBC 8.5  --  8.4  NEUTROABS 6.3  --  5.7  HGB 16.2 16.7 15.2  HCT 46.9 49.0 44.6  MCV 92.0  --  90.8  PLT 195  --  184   Cardiac Enzymes: No results for input(s): CKTOTAL, CKMB, CKMBINDEX, TROPONINI in the last 168 hours. BNP: Invalid input(s): POCBNP CBG:  Recent Labs Lab 05/13/16 1808  GLUCAP 106*   D-Dimer No results for input(s): DDIMER in the  last 72 hours. Hgb A1c  Recent Labs  05/14/16 0517  HGBA1C 5.9*   Lipid Profile  Recent Labs  05/14/16 0517  CHOL 160  HDL 35*  LDLCALC 106*  TRIG 97  CHOLHDL 4.6   Thyroid function studies No results for input(s): TSH, T4TOTAL, T3FREE, THYROIDAB in the last 72 hours.  Invalid input(s): FREET3 Anemia work up No results for input(s): VITAMINB12, FOLATE, FERRITIN, TIBC, IRON, RETICCTPCT in the last 72 hours. Urinalysis    Component Value Date/Time   COLORURINE YELLOW 05/13/2016 1753   APPEARANCEUR HAZY (A) 05/13/2016 1753   LABSPEC 1.027 05/13/2016 1753   PHURINE 5.0 05/13/2016 1753   GLUCOSEU NEGATIVE 05/13/2016 1753   HGBUR NEGATIVE 05/13/2016 1753   HGBUR negative 08/30/2010 0801   BILIRUBINUR NEGATIVE 05/13/2016 1753   BILIRUBINUR n 10/18/2013 1156  KETONESUR NEGATIVE 05/13/2016 1753   PROTEINUR NEGATIVE 05/13/2016 1753   UROBILINOGEN 0.2 10/18/2013 1156   UROBILINOGEN 0.2 08/30/2010 0801   NITRITE NEGATIVE 05/13/2016 1753   LEUKOCYTESUR NEGATIVE 05/13/2016 1753   Sepsis Labs Invalid input(s): PROCALCITONIN,  WBC,  LACTICIDVEN Microbiology No results found for this or any previous visit (from the past 240 hour(s)).  Time coordinating discharge: Over 30 minutes  SIGNED:  Kerney Elbe, DO [facility] 05/15/2016, 1:56 PM Pager [phone]  If 7PM-7AM, please contact night-coverage [email or web address] Password [email or web address]

## 2016-05-15 NOTE — Evaluation (Signed)
Occupational Therapy Evaluation and Discharge Patient Details Name: Elijah [name] MRN: [id number] DOB: May 23, 1941 Today's Date: 05/15/2016    History of Present Illness  Elijah [name] is a 75 y.o. male with medical history significant of HTN, HLD, chronic a. fib on aspirin, CKD stage III, peripheral neuropathy, prostate cancer; who presents with complaints of headache and right-sided facial droop.   Clinical Impression   Pt reports he was independent with ADL PTA. Currently pt overall supervision for safety with ADL and functional mobility due to mild balance deficits and hx of falls PTA. Educated pt and wife on home safety, fall prevention, follow up with outpatient PT for balance deficits, and signs/symptoms of stroke. Pt planning to d/c home with supervision from his wife. No further acute OT needs identified; signing off at this time. Please re-consult if needs change. Thank you for this referral.    Follow Up Recommendations  No OT follow up;Supervision - Intermittent    Equipment Recommendations  None recommended by OT    Recommendations for Other Services       Precautions / Restrictions Precautions Precautions: Fall Precaution Comments: bilat foot neuropathy Restrictions Weight Bearing Restrictions: No      Mobility Bed Mobility               General bed mobility comments: Pt OOB upon arrival.  Transfers Overall transfer level: Needs assistance Equipment used: None Transfers: Sit to/from Stand Sit to Stand: Supervision              Balance Overall balance assessment: Needs assistance;History of Falls   Sitting balance-Leahy Scale: Normal     Standing balance support: No upper extremity supported Standing balance-Leahy Scale: Good Standing balance comment: Mild unsteadiness                            ADL Overall ADL's : Needs assistance/impaired Eating/Feeding: Independent;Sitting   Grooming: Supervision/safety;Standing   Upper  Body Bathing: Supervision/ safety;Standing   Lower Body Bathing: Supervison/ safety   Upper Body Dressing : Supervision/safety;Sitting;Standing   Lower Body Dressing: Supervision/safety   Toilet Transfer: Supervision/safety;Ambulation;Regular Museum/gallery exhibitions officer and Hygiene: Supervision/safety   Tub/ Banker: Supervision/safety;Walk-in shower;Ambulation   Functional mobility during ADLs: Supervision/safety General ADL Comments: Educated pt on fall prevention and home safety. Also educated on signs and symptoms of stroke and need to return to hospital if experiencing symptoms.     Vision Vision Assessment?: No apparent visual deficits   Perception     Praxis      Pertinent Vitals/Pain Pain Assessment: No/denies pain     Hand Dominance Right   Extremity/Trunk Assessment Upper Extremity Assessment Upper Extremity Assessment: Overall WFL for tasks assessed   Lower Extremity Assessment Lower Extremity Assessment: Defer to PT evaluation   Cervical / Trunk Assessment Cervical / Trunk Assessment: Normal   Communication Communication Communication: No difficulties   Cognition Arousal/Alertness: Awake/alert Behavior During Therapy: WFL for tasks assessed/performed Overall Cognitive Status: Within Functional Limits for tasks assessed                     General Comments       Exercises       Shoulder Instructions      Home Living Family/patient expects to be discharged to:: Private residence Living Arrangements: Spouse/significant other Available Help at Discharge: Family;Available 24 hours/day Type of Home: House Home Access: Stairs to enter [employer] of Steps: 2 Entrance  Stairs-Rails: None Home Layout: Two level;Able to live on main level with bedroom/bathroom Alternate Level Stairs-Number of Steps: 10 Alternate Level Stairs-Rails: Right Bathroom Shower/Tub: Occupational psychologist: Standard      Home Equipment: None          Prior Functioning/Environment Level of Independence: Independent        Comments: works from home        OT Problem List: Decreased strength;Impaired balance (sitting and/or standing);Decreased safety awareness   OT Treatment/Interventions:      OT Goals(Current goals can be found in the care plan section) Acute Rehab OT Goals Patient Stated Goal: home OT Goal Formulation: All assessment and education complete, DC therapy  OT Frequency:     Barriers to D/C:            Co-evaluation              End of Session    Activity Tolerance: Patient tolerated treatment well Patient left: in chair;with call bell/phone within reach;with family/visitor present   Time: [id number] OT Time Calculation (min): 17 min Charges:  OT General Charges $OT Visit: 1 Procedure OT Evaluation $OT Eval Low Complexity: 1 Procedure G-Codes:     Binnie Kand M.S., OTR/L Pager: [phone]  05/15/2016, 1:49 PM

## 2016-05-15 NOTE — Progress Notes (Signed)
Pt discharged home at this time with wife.  Alert and oriented.  No complaints.  Verbalized understanding of discharge instructions, including follow-up appointments and prescription pick up.  He has all belongings with him.

## 2016-05-16 ENCOUNTER — Telehealth: Payer: Self-pay | Admitting: Family Medicine

## 2016-05-16 NOTE — Telephone Encounter (Signed)
Pt scheduled  

## 2016-05-20 ENCOUNTER — Encounter: Payer: Self-pay | Admitting: Physician Assistant

## 2016-05-20 NOTE — Progress Notes (Signed)
Cardiology Office Note Date:  05/21/2016  Patient ID:  Elijah [name], DOB 01-Jan-1941, MRN [id number] PCP:  [name], MD  Cardiologist:  Remotely, Dr. Rayann Heman   Chief Complaint: re-establish care, AFib  History of Present Illness: Elijah [name] is a 75 y.o. male with history of permanent AFib, HTN, HLD, CRI stage III, peripheral neuropathy, prostate cancer s/p surgery with subsequent problems with fecal incontinence at times, and most recently hospitalized at [facility] with transient facial droop, slurred speech found with multiple R sided CVA's, seen by neurology and started on Xarelto for a/c given his AF discharge 05/05/16. 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  He has historically been seen in the AF clinic via his PMD in May of 2016, though despite conversation of concerns about possible TIA like symptoms, the patient was not convinced about starting a/c.  He comes in today as a new patient to [facility] to re-establish EP care secondary to his AF.  He reports seeing Dr. Rayann Heman +/- 7-8 years ago, at that time, for possible AF ablation, but recalls felt that he was a poor candidate given he had already been in AF for years and was without symptoms.  He denies any kind of palpitations or exertional intolerances, is unaware of his AF, no CP, or SOB.  He reports his neurological symptoms completely resolved within an hour or so and he remains feeling well.  He denies any dizziness, near syncope or syncope, he has neuropathy chronically and occassional gait instability with infrequent falls, no injuries.  He is tolerating the xarelto, no bleeding or signs of bleeding reported  He sees hi PMD tomorrow to f/u and discuss thyroid findings and neurology early November.  Past Medical History:  Diagnosis Date  . Arthritis 09-10-11   rt. hip, knee, hands  . Atrial fibrillation ([place]) 09-10-11   Chronic A. Fib.-controls with med and Aspirin  . Cancer ([place])  09-10-11   dx. Prostate cancer-bx. done 12'12, surgery planned  . Diverticulosis   . Hemorrhoid   . Hyperlipidemia   . Hypertension   . 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 etiology  . Peripheral neuropathy [facility])     Past Surgical History:  Procedure Laterality Date  . COLONOSCOPY  04/2003, 06/05/2011   diverticulosis, internal and external hemorrhoids 2004 and 2012, 2 small polyps 2012  . EYE SURGERY  09-10-11   Only stitches to close cut, and observation of hematoma from injury  . ROBOT ASSISTED LAPAROSCOPIC RADICAL PROSTATECTOMY  09/15/2011   Procedure: ROBOTIC ASSISTED LAPAROSCOPIC RADICAL PROSTATECTOMY LEVEL 2;  Surgeon: Dutch Gray, MD;  Location: [facility]S;  Service: Urology;  Laterality: N/A;        . TONSILLECTOMY      Current Outpatient Prescriptions  Medication Sig Dispense Refill  . acetaminophen (TYLENOL) 500 MG tablet Take 500 mg by mouth every 6 (six) hours as needed for mild pain.    . Cholecalciferol (VITAMIN D PO) Take 1 tablet by mouth daily.    . metoprolol succinate (TOPROL-XL) 50 MG 24 hr tablet Take 1 tablet (50 mg total) by mouth daily. take 1 tablet by mouth once daily 30 tablet 5  . omeprazole (PRILOSEC) 20 MG capsule Take 1 capsule (20 mg total) by mouth daily. 30 capsule 5  . rivaroxaban (XARELTO) 20 MG TABS tablet Take 1 tablet (20 mg total) by mouth daily with supper. 30 tablet 0  . simvastatin (ZOCOR) 20 MG tablet Take 0.5  tablets (10 mg total) by mouth daily. 30 tablet 5   No current facility-administered medications for this visit.     Allergies:   Ciprofloxacin and Flagyl [metronidazole]   Social History:  The patient  reports that he quit smoking about 47 years ago. He has never used smokeless tobacco. He reports that he does not drink alcohol or use drugs.   Family History:  The patient's family history includes Diabetes in his mother; Diverticulosis in his mother; Heart disease (age of onset: 10) in his father; Hypertension in his mother.  ROS:   Please see the history of present illness.  All other systems are reviewed and otherwise negative.   PHYSICAL EXAM:  VS:  BP (!) 142/90   Pulse 82   Ht 6\' 3"  (1.905 m)   Wt 222 lb (100.7 kg)   BMI 27.75 kg/m  BMI: Body mass index is 27.75 kg/m. Well nourished, well developed, in no acute distress  HEENT: normocephalic, atraumatic  Neck: no JVD, carotid bruits or masses Cardiac:  IRRR, no significant murmurs, no rubs, or gallops Lungs:  clear to auscultation bilaterally, no wheezing, rhonchi or rales  Abd: soft, nontender MS: no deformity or atrophy Ext: no edema  Skin: warm and dry, no rash Neuro:  No gross deficits appreciated Psych: euthymic mood, full affect     EKG:  Done 05/13/16 is reviewed by myself and Dr. Lovena Le, AFib, RBBB (new since older EKGs)  05/15/16: TTE Study Conclusions - Left ventricle: The cavity size was normal. Wall thickness was   increased in a pattern of mild LVH. Systolic function was normal.   The estimated ejection fraction was in the range of 55% to 60%.   Wall motion was normal; there were no regional wall motion   abnormalities. - Mitral valve: Calcified annulus. There was mild regurgitation. - Left atrium: The atrium was severely dilated. - Right ventricle: The cavity size was mildly dilated. - Right atrium: The atrium was severely dilated. Impressions: - Normal LV systolic function; mild LVH; severe biatrial  (LA 32mm)   enlargement; mild RVE; mild MR; trace TR.  Recent Labs: 05/15/2016: ALT 20; BUN 20; Creatinine, Ser 1.40; Hemoglobin 15.2; Magnesium 2.0; Platelets 184; Potassium 4.0; Sodium 139  05/14/2016: Cholesterol 160; HDL 35; LDL Cholesterol 106; Total CHOL/HDL Ratio 4.6; Triglycerides 97; VLDL 19   Estimated Creatinine Clearance: 55.3 mL/min (by C-G formula based on SCr of 1.4 mg/dL (H)).   Wt Readings from Last 3 Encounters:  05/21/16 222 lb (100.7 kg)  05/13/16 224 lb (101.6 kg)  05/13/16 224 lb 12.8 oz (102 kg)      Other studies reviewed: Additional studies/records reviewed today include: summarized above  ASSESSMENT AND PLAN:  1. Long hx of permanent AFib, rate controlled and asymptomatic     CHA2DS2Vasc is 4, now on Xarelto     Calc Cr. Cl is 64   Lengthy discussion today about full a/c, importance of not missing doses,  bleeding, signs of bleeding and avoiding high risk activities, he has peripheral neuropathy and some trouble with gait, though falls are infrequent/rare.     2.HTN    bordreline today, no changes, he follows BP with his PMD  Case discussed with dr. Lovena Le, no need for changes, OK to f/u with Dr. Rayann Heman in 23months  Disposition: F/u with his PMD and neurology as planned, D. Allred in 41mo, sooner if needed.  Current medicines are reviewed at length with the patient today.  The patient did not have  any concerns regarding medicines.  Elijah Lasso, PA-C 05/21/2016 9:31 AM     [facility] [address] [place] [address] [phone] (office)  [phone] (fax)

## 2016-05-21 ENCOUNTER — Ambulatory Visit (INDEPENDENT_AMBULATORY_CARE_PROVIDER_SITE_OTHER): Payer: Medicare Other | Admitting: Physician Assistant

## 2016-05-21 ENCOUNTER — Encounter: Payer: Self-pay | Admitting: Physician Assistant

## 2016-05-21 VITALS — BP 142/90 | HR 82 | Ht 75.0 in | Wt 222.0 lb

## 2016-05-21 DIAGNOSIS — D0359 Melanoma in situ of other part of trunk: Secondary | ICD-10-CM | POA: Diagnosis not present

## 2016-05-21 DIAGNOSIS — I4821 Permanent atrial fibrillation: Secondary | ICD-10-CM

## 2016-05-21 DIAGNOSIS — Z08 Encounter for follow-up examination after completed treatment for malignant neoplasm: Secondary | ICD-10-CM | POA: Diagnosis not present

## 2016-05-21 DIAGNOSIS — I10 Essential (primary) hypertension: Secondary | ICD-10-CM

## 2016-05-21 DIAGNOSIS — C44519 Basal cell carcinoma of skin of other part of trunk: Secondary | ICD-10-CM | POA: Diagnosis not present

## 2016-05-21 DIAGNOSIS — Z85828 Personal history of other malignant neoplasm of skin: Secondary | ICD-10-CM | POA: Diagnosis not present

## 2016-05-21 DIAGNOSIS — Z1283 Encounter for screening for malignant neoplasm of skin: Secondary | ICD-10-CM | POA: Diagnosis not present

## 2016-05-21 DIAGNOSIS — I639 Cerebral infarction, unspecified: Secondary | ICD-10-CM

## 2016-05-21 DIAGNOSIS — Z85820 Personal history of malignant melanoma of skin: Secondary | ICD-10-CM | POA: Diagnosis not present

## 2016-05-21 DIAGNOSIS — I482 Chronic atrial fibrillation: Secondary | ICD-10-CM

## 2016-05-21 NOTE — Patient Instructions (Signed)
Medication Instructions:  Your physician recommends that you continue on your current medications as directed. Please refer to the Current Medication list given to you today.    If you need a refill on your cardiac medications before your next appointment, please call your pharmacy.  Labwork:  NONE ORDER TODAY    Testing/Procedures:  NONE ORDER TODAY    Follow-Up:  Your physician wants you to follow-up in:  IN  6  MONTHS WITH DR ALLRED You will receive a reminder letter in the mail two months in advance. If you don't receive a letter, please call our office to schedule the follow-up appointment.      Any Other Special Instructions Will Be Listed Below (If Applicable).                                                                                                                                                   

## 2016-05-22 ENCOUNTER — Encounter: Payer: Self-pay | Admitting: Family Medicine

## 2016-05-22 ENCOUNTER — Ambulatory Visit (INDEPENDENT_AMBULATORY_CARE_PROVIDER_SITE_OTHER): Payer: Medicare Other | Admitting: Family Medicine

## 2016-05-22 VITALS — BP 136/80 | HR 62 | Temp 97.8°F | Wt 221.8 lb

## 2016-05-22 DIAGNOSIS — R351 Nocturia: Secondary | ICD-10-CM | POA: Diagnosis not present

## 2016-05-22 DIAGNOSIS — E041 Nontoxic single thyroid nodule: Secondary | ICD-10-CM

## 2016-05-22 DIAGNOSIS — E785 Hyperlipidemia, unspecified: Secondary | ICD-10-CM

## 2016-05-22 DIAGNOSIS — I4821 Permanent atrial fibrillation: Secondary | ICD-10-CM

## 2016-05-22 DIAGNOSIS — I482 Chronic atrial fibrillation: Secondary | ICD-10-CM

## 2016-05-22 DIAGNOSIS — E875 Hyperkalemia: Secondary | ICD-10-CM

## 2016-05-22 DIAGNOSIS — Z8673 Personal history of transient ischemic attack (TIA), and cerebral infarction without residual deficits: Secondary | ICD-10-CM

## 2016-05-22 DIAGNOSIS — I639 Cerebral infarction, unspecified: Secondary | ICD-10-CM | POA: Diagnosis not present

## 2016-05-22 DIAGNOSIS — I10 Essential (primary) hypertension: Secondary | ICD-10-CM

## 2016-05-22 DIAGNOSIS — K219 Gastro-esophageal reflux disease without esophagitis: Secondary | ICD-10-CM

## 2016-05-22 LAB — COMPREHENSIVE METABOLIC PANEL
ALK PHOS: 88 U/L (ref 39–117)
ALT: 23 U/L (ref 0–53)
AST: 18 U/L (ref 0–37)
Albumin: 4.3 g/dL (ref 3.5–5.2)
BUN: 25 mg/dL — AB (ref 6–23)
CHLORIDE: 104 meq/L (ref 96–112)
CO2: 29 mEq/L (ref 19–32)
Calcium: 9.8 mg/dL (ref 8.4–10.5)
Creatinine, Ser: 1.46 mg/dL (ref 0.40–1.50)
GFR: 50.04 mL/min — ABNORMAL LOW (ref >60.00)
GLUCOSE: 90 mg/dL (ref 70–99)
POTASSIUM: 5.7 meq/L — AB (ref 3.5–5.1)
SODIUM: 140 meq/L (ref 135–145)
TOTAL PROTEIN: 6.8 g/dL (ref 6.0–8.3)
Total Bilirubin: 0.6 mg/dL (ref 0.2–1.2)

## 2016-05-22 LAB — CBC
HEMATOCRIT: 46.6 % (ref 39.0–52.0)
Hemoglobin: 16 g/dL (ref 13.0–17.0)
MCHC: 34.3 g/dL (ref 30.0–36.0)
MCV: 90.5 fl (ref 78.0–100.0)
Platelets: 222 10*3/uL (ref 150.0–400.0)
RBC: 5.15 Mil/uL (ref 4.22–5.81)
RDW: 13.7 % (ref 11.5–15.5)
WBC: 8.5 10*3/uL (ref 4.0–10.5)

## 2016-05-22 LAB — T4, FREE: FREE T4: 1.12 ng/dL (ref 0.60–1.60)

## 2016-05-22 LAB — T3, FREE: T3, Free: 4 pg/mL (ref 2.3–4.2)

## 2016-05-22 LAB — TSH: TSH: 0.74 u[IU]/mL (ref 0.35–4.50)

## 2016-05-22 NOTE — Assessment & Plan Note (Signed)
S: controlled on repeat BP Readings from Last 3 Encounters:  05/22/16 136/80  05/21/16 (!) 142/90  05/15/16 136/78  A/P:Continue current meds:  Metoprolol alone. Adjust up if above 140/90 at next visit.

## 2016-05-22 NOTE — Assessment & Plan Note (Signed)
S: prior cough and reflux controlled on omeprazole 20mg  A/P: given concern for possible stroke and kidney risks- opted to try zantac BID.

## 2016-05-22 NOTE — Assessment & Plan Note (Signed)
S:Anticoagulation- xarelto 20mg  greed to after stroke Rate control with metoprolol (used to run around low 100s).  A/P: continue current meds, thrilled patient finally agrees to anticoagulation and thankful no long term effects from stroke he had

## 2016-05-22 NOTE — Patient Instructions (Signed)
No change in blood pressure medicine. Goal <140/90.   I would advise taking full tablet of simvastatin to further lower cholesterol and stroke risk  Thyroid exam- also test thyroid and refer to endocrine. We will call you within a week about your referral. If you do not hear within 2 weeks, give [place] a call.   Trial zantac 150mg  twice a day and stop prilosec. Give 3-4 week trial and may switch back if not effective  Continue xarelto 20mg  indefinitely  Urine culture before you leave

## 2016-05-22 NOTE — Assessment & Plan Note (Addendum)
S: Patient presented with right sided facial weakness and slurred speech that resolved by the time he got to office but persistent headache so sent to the ED where he was found to have right sided CVA both acute with prior CVA as well. Echo and carotid workups without clear source. High probability embolic with history of a fib and stroke pattern. All of patients symptoms cleared within several hours of admissiona dn <24 hours A/P: patient started on xarelto and agrees to continue at this point to prevent future stroke. He wants to trial off PPI with potential stroke risk. We also discussed potentially increasing statin dose to full pill- he is hesitant. Bp controlled but will follow

## 2016-05-22 NOTE — Assessment & Plan Note (Signed)
S: mild poorly controlled on simvastatin 10mg  (half of 20mg ). No myalgias.  Lab Results  Component Value Date   CHOL 160 05/14/2016   HDL 35 (L) 05/14/2016   LDLCALC 106 (H) 05/14/2016   LDLDIRECT 89.0 12/06/2014   TRIG 97 05/14/2016   CHOLHDL 4.6 05/14/2016   A/P: advised full tablet of simvastatin he declines for now- already has some myalgias and muscle weakness at times

## 2016-05-22 NOTE — Progress Notes (Signed)
Subjective:  Elijah [name] is a 75 y.o. year old very pleasant male patient who presents for/with See problem oriented charting ROS- No facial or extremity weakness. No slurred words or trouble swallowing. no blurry vision or double vision. No paresthesias other than long term neuropathy. No confusion or word finding difficulties. Marland Kitchensee any ROS included in HPI as well.   Past Medical History-  Patient Active Problem List   Diagnosis Date Noted  . History of CVA (cerebrovascular accident) 05/14/2016    Priority: High  . Thyroid nodule     Priority: High  . Melanoma of skin ([place]) 12/04/2015    Priority: High  . Atrial fibrillation ([place]) 04/06/2007    Priority: High  . GERD (gastroesophageal reflux disease) 12/04/2015    Priority: Medium  . CKD (chronic kidney disease), stage III 12/07/2014    Priority: Medium  . History of prostate cancer 09/23/2012    Priority: Medium  . 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 Essential hypertension 04/07/2007    Priority: Medium  . Hyperlipidemia 04/06/2007    Priority: Medium  . Osteoarthritis of left hip 08/09/2014    Priority: Low  . Actinic keratosis 06/05/2014    Priority: Low    Medications- reviewed and updated Current Outpatient Prescriptions  Medication Sig Dispense Refill  . acetaminophen (TYLENOL) 500 MG tablet Take 500 mg by mouth every 6 (six) hours as needed for mild pain.    . Cholecalciferol (VITAMIN D PO) Take 1 tablet by mouth daily.    . metoprolol succinate (TOPROL-XL) 50 MG 24 hr tablet Take 1 tablet (50 mg total) by mouth daily. take 1 tablet by mouth once daily 30 tablet 5  . omeprazole (PRILOSEC) 20 MG capsule Take 1 capsule (20 mg total) by mouth daily. 30 capsule 5  . rivaroxaban (XARELTO) 20 MG TABS tablet Take 1 tablet (20 mg total) by mouth daily with supper. 30 tablet 0  . simvastatin (ZOCOR) 20 MG tablet Take 0.5 tablets (10 mg total) by mouth daily. 30 tablet 5   No  current facility-administered medications for this visit.     Objective: BP 136/80   Pulse 62   Temp 97.8 F (36.6 C) (Oral)   Wt 221 lb 12.8 oz (100.6 kg)   SpO2 99%   BMI 27.72 kg/m  Gen: NAD, resting comfortably CV: RRR no murmurs rubs or gallops Lungs: CTAB no crackles, wheeze, rhonchi Abdomen: soft/nontender/nondistended/normal bowel sounds. No rebound or guarding.  Ext: no edema Skin: warm, dry Neuro: CN II-XII intact, sensation and reflexes normal throughout, 5/5 muscle strength in bilateral upper and lower extremities. Normal finger to nose. Normal rapid alternating movements. No pronator drift. normal gait.   Assessment/Plan:  Essential hypertension S: controlled on repeat BP Readings from Last 3 Encounters:  05/22/16 136/80  05/21/16 (!) 142/90  05/15/16 136/78  A/P:Continue current meds:  Metoprolol alone. Adjust up if above 140/90 at next visit.    Hyperlipidemia S: mild poorly controlled on simvastatin 49m (half of 239m. No myalgias.  Lab Results  Component Value Date   CHOL 160 05/14/2016   HDL 35 (L) 05/14/2016   LDLCALC 106 (H) 05/14/2016   LDLDIRECT 89.0 12/06/2014   TRIG 97 05/14/2016   CHOLHDL 4.6 05/14/2016   A/P: advised full tablet of simvastatin he declines for now- already has some myalgias and muscle weakness at times   Thyroid nodule S: Thyroid nodule noted on imaging for stroke A/P: Get thyroid labs today, refer  to endocrine as met criteria for biopsy   GERD (gastroesophageal reflux disease) S: prior cough and reflux controlled on omeprazole 20mg A/P: given concern for possible stroke and kidney risks- opted to try zantac BID.    Atrial fibrillation ([facility]) S:Anticoagulation- xarelto 20mg greed to after stroke Rate control with metoprolol (used to run around low 100s).  A/P: continue current meds, thrilled patient finally agrees to anticoagulation and thankful no long term effects from stroke he had   History of CVA  (cerebrovascular accident) S: Patient presented with right sided facial weakness and slurred speech that resolved by the time he got to office but persistent headache so sent to the ED where he was found to have right sided CVA both acute with prior CVA as well. Echo and carotid workups without clear source. High probability embolic with history of a fib and stroke pattern. All of patients symptoms cleared within several hours of admissiona dn <24 hours A/P: patient started on xarelto and agrees to continue at this point to prevent future stroke. He wants to trial off PPI with potential stroke risk. We also discussed potentially increasing statin dose to full pill- he is hesitant. Bp controlled but will follow  Also reports1-2-->4-5 nocturia - will get Urine cx  has January visit  Orders Placed This Encounter  Procedures  . Urine culture    [facility]  . TSH    [facility]  . T4, free    [place]  . T3, free  . CBC    [place]  . Comprehensive metabolic panel      . Ambulatory referral to Endocrinology    Referral Priority:   Routine    Referral Type:   Consultation    Referral Reason:   Specialty Services Required    Number of Visits Requested:   1   The duration of face-to-face time during this visit was greater than 45 minutes (11:20-12:07). Greater than 50% of this time was spent in counseling about stroke, stroke risk, emobolic vs. Atherosclerotic stroke, discussing meds, discussing PPI benefit/risks, and several other topics revolving around hospitalization.  .  Return precautions advised.   , MD         

## 2016-05-22 NOTE — Assessment & Plan Note (Addendum)
S: Thyroid nodule noted on imaging for stroke A/P: Get thyroid labs today, refer to endocrine as met criteria for biopsy

## 2016-05-22 NOTE — Progress Notes (Signed)
Pre visit review using our clinic review tool, if applicable. No additional management support is needed unless otherwise documented below in the visit note. 

## 2016-05-23 ENCOUNTER — Other Ambulatory Visit: Payer: Self-pay

## 2016-05-23 ENCOUNTER — Other Ambulatory Visit: Payer: Self-pay | Admitting: *Deleted

## 2016-05-23 DIAGNOSIS — E875 Hyperkalemia: Secondary | ICD-10-CM

## 2016-05-23 LAB — POTASSIUM: Potassium: 5.1 mmol/L (ref 3.5–5.3)

## 2016-05-23 MED ORDER — RANITIDINE HCL 150 MG PO TABS: 150.0000 mg | ORAL_TABLET | Freq: Two times a day (BID) | ORAL | 3 refills | Status: DC

## 2016-05-23 NOTE — Addendum Note (Signed)
Addended by: Gari Crown D on: 05/23/2016 04:14 PM   Modules accepted: Orders

## 2016-05-24 LAB — URINE CULTURE: ORGANISM ID, BACTERIA: NO GROWTH

## 2016-05-28 DIAGNOSIS — L814 Other melanin hyperpigmentation: Secondary | ICD-10-CM | POA: Diagnosis not present

## 2016-05-28 DIAGNOSIS — D0359 Melanoma in situ of other part of trunk: Secondary | ICD-10-CM | POA: Diagnosis not present

## 2016-06-06 ENCOUNTER — Encounter: Payer: Self-pay | Admitting: Endocrinology

## 2016-06-06 ENCOUNTER — Ambulatory Visit (INDEPENDENT_AMBULATORY_CARE_PROVIDER_SITE_OTHER): Payer: Medicare Other | Admitting: Endocrinology

## 2016-06-06 VITALS — BP 118/70 | HR 70 | Ht 75.0 in | Wt 222.0 lb

## 2016-06-06 DIAGNOSIS — E042 Nontoxic multinodular goiter: Secondary | ICD-10-CM | POA: Diagnosis not present

## 2016-06-06 DIAGNOSIS — I639 Cerebral infarction, unspecified: Secondary | ICD-10-CM

## 2016-06-06 NOTE — Progress Notes (Signed)
Patient ID: Elijah [name], male   DOB: Apr 23, 1941, 75 y.o.   MRN: [id number]            Reason for Appointment: Evaluation of thyroid nodule    History of Present Illness:   The patient's thyroid nodule was first discovered when he was having CT angiography for his cerebrovascular disease The thyroid ultrasound on 05/14/16 showed the following Has a dominant nodule, location Left; Superior Size: Measures 3.7 x 2.0 x 4.3 cm, Composition: solid/almost completely solid (2) Echogenicity: isoechoic (1) Also has 6-10 nodules over 1 cm bilaterally  Patient has never been told to have a thyroid enlargement He has not felt any swelling in his neck He does not complain of any difficulty swallowing, pressure or choking his neck   Lab Results  Component Value Date   FREET4 1.12 05/22/2016   TSH 0.74 05/22/2016   TSH 1.21 12/13/2014   TSH 1.25 12/06/2014      Medication List       Accurate as of 06/06/16  1:54 PM. Always use your most recent med list.          acetaminophen 500 MG tablet Commonly known as:  TYLENOL Take 500 mg by mouth every 6 (six) hours as needed for mild pain.   metoprolol succinate 50 MG 24 hr tablet Commonly known as:  TOPROL-XL Take 1 tablet (50 mg total) by mouth daily. take 1 tablet by mouth once daily   ranitidine 150 MG tablet Commonly known as:  ZANTAC Take 1 tablet (150 mg total) by mouth 2 (two) times daily.   rivaroxaban 20 MG Tabs tablet Commonly known as:  XARELTO Take 1 tablet (20 mg total) by mouth daily with supper.   simvastatin 20 MG tablet Commonly known as:  ZOCOR Take 0.5 tablets (10 mg total) by mouth daily.   VITAMIN D PO Take 1 tablet by mouth daily.       Allergies:  Allergies  Allergen Reactions  . Ciprofloxacin Nausea Only  . Flagyl [Metronidazole] Nausea Only    Past Medical History:  Diagnosis Date  . Arthritis 09-10-11   rt. hip, knee, hands  . Atrial fibrillation ([place]) 09-10-11   Chronic A. Fib.-controls with med  and Aspirin  . Cancer ([place]) 09-10-11   dx. Prostate cancer-bx. done 12'12, surgery planned  . Diverticulosis   . Hemorrhoid   . Hyperlipidemia   . Hypertension   . 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 etiology  . Peripheral neuropathy ([facility])     There is no history of radiation to the neck in childhood  Past Surgical History:  Procedure Laterality Date  . COLONOSCOPY  04/2003, 06/05/2011   diverticulosis, internal and external hemorrhoids 2004 and 2012, 2 small polyps 2012  . EYE SURGERY  09-10-11   Only stitches to close cut, and observation of hematoma from injury  . ROBOT ASSISTED LAPAROSCOPIC RADICAL PROSTATECTOMY  09/15/2011   Procedure: ROBOTIC ASSISTED LAPAROSCOPIC RADICAL PROSTATECTOMY LEVEL 2;  Surgeon: Dutch Gray, MD;  Location: [facility];  Service: Urology;  Laterality: N/A;        . TONSILLECTOMY      Family History  Problem Relation Age of Onset  . Diabetes Mother   . Hypertension Mother   . Diverticulosis Mother   . Heart disease Father 55    myoc infarction  . Colon cancer Neg Hx     Social History:  reports that he quit smoking about 47 years ago. He has never used smokeless  tobacco. He reports that he does not drink alcohol or use drugs.   Review of Systems:  No history of recent weight change  No unusual fatigue            No history of Diabetes.      GI: He has had a history of reflux treated with Prilosec  He has had significant hip pain         Examination:   BP 118/70   Pulse 70   Ht 6\' 3"  (1.905 m)   Wt 222 lb (100.7 kg)   SpO2 98%   BMI 27.75 kg/m    General Appearance:  well-looking        Eyes: No abnormal prominence or swelling of the eyes          THYROID: He has bilateral enlargement. Right lobe of thyroid is about 3 times normal, felt best on swallowing, smooth and slightly firm. Left lobe is palpable mostly medially and is about 3-3.5 cm in size, more difficult to palpate No lymphadenopathy in the neck There is no  stridor. Pemberton sign negative .  There is no lymphadenopathy in the neck  Heart sounds normal Lungs clear Abdomen exam not indicated Reflexes at biceps are normal.  Mitral of present Skin: No rash or ecchymoses Extremities: No edema  Assessment/Plan:  Multinodular goiter with normal thyroid functions: This has been recently diagnosed and was found incidentally on his CT scan. He does have a dominant 3.7 cm nodule on the left side which is isoechoic Although ideally he should have needle aspiration done to evaluate it this would be difficult to do now with his starting Xarelto for recent cerebrovascular event Discussed that the chances of malignancy are very small in general and also relatively smaller in multinodular goiter Discussed the process of doing needle aspiration biopsy and given him information on thyroid nodules  It would be reasonable to wait 4-6 months to recheck his ultrasound and then decide on biopsy at that time  Meanwhile since he is going to be seeing his neurologist later this month he can ask her when it would be safe to stop Xarelto  to do the biopsy    [facility] 06/06/2016  Note: This office note was prepared with Dragon voice recognition system technology. Any transcriptional errors that result from this process are unintentional.

## 2016-06-09 ENCOUNTER — Telehealth: Payer: Self-pay | Admitting: Family Medicine

## 2016-06-09 NOTE — Telephone Encounter (Signed)
When the pt was in the hospital the neurologist Dr. Chana Bode gave him a Rx rivaroxaban (XARELTO) 20 MG TABS tablet.   He would like for Dr. [name] to see him for this Rx instead of seeing Dr. Chana Bode. He currently has a week supply left and was wanting to see if Dr. [name] can prescribe this medication for him and send it to:   [place], [place]. [phone] (Phone) [phone] (Fax)

## 2016-06-09 NOTE — Telephone Encounter (Signed)
Yes thanks, may refill 6 months worth

## 2016-06-10 ENCOUNTER — Other Ambulatory Visit: Payer: Self-pay

## 2016-06-10 MED ORDER — RIVAROXABAN 20 MG PO TABS: 20.0000 mg | ORAL_TABLET | Freq: Every day | ORAL | 5 refills | Status: DC

## 2016-06-10 NOTE — Telephone Encounter (Signed)
Prescription sent to pharmacy as requested. I called patient and left a voicemail message letting patient know

## 2016-06-18 ENCOUNTER — Encounter: Payer: Self-pay | Admitting: Family Medicine

## 2016-06-18 ENCOUNTER — Other Ambulatory Visit: Payer: Self-pay

## 2016-06-19 ENCOUNTER — Other Ambulatory Visit: Payer: Self-pay

## 2016-06-19 MED ORDER — SIMVASTATIN 20 MG PO TABS: 20.0000 mg | ORAL_TABLET | Freq: Every day | ORAL | 1 refills | Status: DC

## 2016-06-23 ENCOUNTER — Encounter: Payer: Self-pay | Admitting: Nurse Practitioner

## 2016-06-23 ENCOUNTER — Ambulatory Visit (INDEPENDENT_AMBULATORY_CARE_PROVIDER_SITE_OTHER): Payer: Medicare Other | Admitting: Nurse Practitioner

## 2016-06-23 VITALS — BP 125/76 | HR 63 | Ht 75.0 in | Wt 223.6 lb

## 2016-06-23 DIAGNOSIS — E785 Hyperlipidemia, unspecified: Secondary | ICD-10-CM | POA: Diagnosis not present

## 2016-06-23 DIAGNOSIS — I639 Cerebral infarction, unspecified: Secondary | ICD-10-CM | POA: Diagnosis not present

## 2016-06-23 DIAGNOSIS — I10 Essential (primary) hypertension: Secondary | ICD-10-CM | POA: Diagnosis not present

## 2016-06-23 DIAGNOSIS — Z8673 Personal history of transient ischemic attack (TIA), and cerebral infarction without residual deficits: Secondary | ICD-10-CM

## 2016-06-23 DIAGNOSIS — I482 Chronic atrial fibrillation: Secondary | ICD-10-CM | POA: Diagnosis not present

## 2016-06-23 DIAGNOSIS — I4821 Permanent atrial fibrillation: Secondary | ICD-10-CM

## 2016-06-23 NOTE — Progress Notes (Signed)
I agree with the above plan 

## 2016-06-23 NOTE — Progress Notes (Signed)
[facility]  PATIENT: Elijah [name] DOB: [birth year]   REASON FOR VISIT: Hospital follow-up for stroke HISTORY FROM: Patient    HISTORY OF PRESENT ILLNESS:This is a 75 year old right-handed man who was noted by his wife to have some facial weakness on the afternoon of 05/13/16. The patient himself was unaware of this and so most information is obtained from review of the medical records.  It appears that the patient's wife went shopping around 2 PM on the afternoon of 05/13/16 ([facility]KW). When she returned home at approximately 4 PM, she feels as if the right side of the patient's mouth was drooping but also stated that he was drooling out of the left side. She noted that he had some possible slurred speech and that his right eye seemed to be nearly closed. She took the patient to his PCP where he was noted to have a normal neurological examination. However, due to concerns about possible TIA or stroke, he was sent to the emergency department for further evaluation. In the ED, MRI scan of the brain was obtained and showed acute ischemic changes in the right cerebral hemisphere secondary to known atrial fibrillation not on anticoagulant CT angiogram of the head and neck with right MCA occlusion with collaterals LDL 106 hemoglobin A1c 5.9. The patient's main concern is some pain and headache. He states that he had some moderate pain around the right eye yesterday afternoon. This has since resolved. He was not aware that he was having any facial weakness. He denies any weakness, numbness, slurred speech, difficulty expressing himself, vision changes, double vision, coordination problems, or balance disturbances. He has never had any prior history of focal neurological deficits. He has a known history of atrial fibrillation but has been resistant to going on anticoagulation and has been maintained on aspirin. Patient was not administered IV t-PA secondary to delay in arrival. He was  admitted for further evaluation and treatment.Wife at bedside. He still has mild right facial droop but otherwise doing well.  He returns for follow-up today in the stroke clinic. He remains on Xarelto with no bruising or bleeding. He has not had further stroke or TIA symptoms Dr. Dwyane Dee is following his thyroid goiter. He has long history of permanent atrial fib rate controlled and asymptomatic he also has a history of peripheral neuropathy but no recent falls. He goes to the gym several times a week to work out. He returns for reevaluation REVIEW  OF SYSTEMS: Full 14 system review of systems performed and notable only for those listed, all others are neg:  Constitutional: neg  Cardiovascular: neg Ear/Nose/Throat: neg  Skin: neg Eyes: neg Respiratory: neg Gastroitestinal: neg  Hematology/Lymphatic easy bruising Endocrine: neg Musculoskeletal:neg Allergy/Immunology: neg Neurological: neg Psychiatric: neg Sleep : neg   ALLERGIES: Allergies  Allergen Reactions  . Ciprofloxacin Nausea Only  . Flagyl [Metronidazole] Nausea Only    HOME MEDICATIONS: Outpatient Medications Prior to Visit  Medication Sig Dispense Refill  . acetaminophen (TYLENOL) 500 MG tablet Take 500 mg by mouth every 6 (six) hours as needed for mild pain.    . Cholecalciferol (VITAMIN D PO) Take 1 tablet by mouth daily. 1000units daily    . metoprolol succinate (TOPROL-XL) 50 MG 24 hr tablet Take 1 tablet (50 mg total) by mouth daily. take 1 tablet by mouth once daily 30 tablet 5  . ranitidine (ZANTAC) 150 MG tablet Take 1 tablet (150 mg total) by mouth 2 (two) times daily. 180 tablet 3  . rivaroxaban (XARELTO)  20 MG TABS tablet Take 1 tablet (20 mg total) by mouth daily with supper. 30 tablet 5  . simvastatin (ZOCOR) 20 MG tablet Take 1 tablet (20 mg total) by mouth daily. 90 tablet 1   No facility-administered medications prior to visit.     PAST MEDICAL HISTORY: Past Medical History:  Diagnosis Date  .  Arthritis 09-10-11   rt. hip, knee, hands  . Atrial fibrillation ([place]) 09-10-11   Chronic A. Fib.-controls with med and Aspirin  . Cancer ([place]) 09-10-11   dx. Prostate cancer-bx. done 12'12, surgery planned  . Diverticulosis   . Hemorrhoid   . Hyperlipidemia   . Hypertension   . 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 etiology  . Peripheral neuropathy ([place])   . Prostate cancer ([place])     PAST SURGICAL HISTORY: Past Surgical History:  Procedure Laterality Date  . COLONOSCOPY  04/2003, 06/05/2011   diverticulosis, internal and external hemorrhoids 2004 and 2012, 2 small polyps 2012  . EYE SURGERY  09-10-11   Only stitches to close cut, and observation of hematoma from injury  . ROBOT ASSISTED LAPAROSCOPIC RADICAL PROSTATECTOMY  09/15/2011   Procedure: ROBOTIC ASSISTED LAPAROSCOPIC RADICAL PROSTATECTOMY LEVEL 2;  Surgeon: Dutch Gray, MD;  Location: [facility];  Service: Urology;  Laterality: N/A;        . TONSILLECTOMY      FAMILY HISTORY: Family History  Problem Relation Age of Onset  . Diabetes Mother   . Hypertension Mother   . Diverticulosis Mother   . Heart disease Father 56    myoc infarction  . Colon cancer Neg Hx     SOCIAL HISTORY: Social History   Social History  . Marital status: Married    Spouse name: N/A  . Number of children: 3  . Years of education: N/A   Occupational History  . Consultant Retired   Social History Main Topics  . Smoking status: Former Smoker    Quit date: 08/04/1968  . Smokeless tobacco: Never Used  . Alcohol use No     Comment: none in 20 yrs  . Drug use: No  . Sexual activity: Yes   Other Topics Concern  . Not on file   Social History Narrative   Married with 3 kids. 6 grandkids.       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       Hobbies: beach (place at [employer]) usually every 2 weeks.      PHYSICAL EXAM  Vitals:   06/23/16 0951  BP: 125/76  Pulse: 63  Weight: 223 lb 9.6 oz (101.4 kg)    Height: 6\' 3"  (1.905 m)   Body mass index is 27.95 kg/m.  Generalized: Well developed, in no acute distress  Head: normocephalic and atraumatic,. Oropharynx benign  Neck: Supple, no carotid bruits  Cardiac: Irregularly irregular rate rhythm,  Musculoskeletal: No deformity   Neurological examination   Mentation: Alert oriented to time, place, history taking. Attention span and concentration appropriate. Recent and remote memory intact.  Follows all commands speech and language fluent.   Cranial nerve II-XII: Fundoscopic exam not done.Pupils were equal round reactive to light extraocular movements were full, visual field were full on confrontational test. Facial sensation and strength were normal. hearing was intact to finger rubbing bilaterally. Uvula tongue midline. head turning and shoulder shrug were normal and symmetric.Tongue protrusion into cheek strength was normal. Motor: normal bulk and tone, full strength in the BUE, BLE, fine finger  movements normal, no pronator drift. No focal weakness Sensory: normal and symmetric to light touch, pinprick, and  Vibration, proprioception  Coordination: finger-nose-finger, heel-to-shin bilaterally, no dysmetria Reflexes1+ upper lower and symmetric plantar responses were flexor bilaterally. Gait and Station: Rising up from seated position without assistance, normal stance,  moderate stride, good arm swing, smooth turning, able to perform tiptoe, and heel walking without difficulty. Tandem gait is steady  DIAGNOSTIC DATA (LABS, IMAGING, TESTING) - I reviewed patient records, labs, notes, testing and imaging myself where available.  Lab Results  Component Value Date   WBC 8.5 05/22/2016   HGB 16.0 05/22/2016   HCT 46.6 05/22/2016   MCV 90.5 05/22/2016   PLT 222.0 05/22/2016      Component Value Date/Time   NA 140 05/22/2016 1216   K 5.1 05/23/2016 1614   CL 104 05/22/2016 1216   CO2 29 05/22/2016 1216   GLUCOSE 90 05/22/2016 1216    GLUCOSE 92 06/11/2006 1031   BUN 25 (H) 05/22/2016 1216   CREATININE 1.46 05/22/2016 1216   CALCIUM 9.8 05/22/2016 1216   PROT 6.8 05/22/2016 1216   ALBUMIN 4.3 05/22/2016 1216   AST 18 05/22/2016 1216   ALT 23 05/22/2016 1216   ALKPHOS 88 05/22/2016 1216   BILITOT 0.6 05/22/2016 1216   GFRNONAA 48 (L) 05/15/2016 0331   GFRAA 56 (L) 05/15/2016 0331   Lab Results  Component Value Date   CHOL 160 05/14/2016   HDL 35 (L) 05/14/2016   LDLCALC 106 (H) 05/14/2016   LDLDIRECT 89.0 12/06/2014   TRIG 97 05/14/2016   CHOLHDL 4.6 05/14/2016   Lab Results  Component Value Date   HGBA1C 5.9 (H) 05/14/2016   No results found for: VITAMINB12 Lab Results  Component Value Date   TSH 0.74 05/22/2016      ASSESSMENT AND PLAN  75 y.o. year old male  has a past medical history of  Atrial fibrillation ([place]) (09-10-11);  Hyperlipidemia; Hypertension; Neuropathy of foot (09-10-11); Peripheral neuropathy ([place]); and Prostate cancer ([place]). here for hospital follow-up for stroke. MRI scan of the brain was obtained and showed acute ischemic changes in the right cerebral hemisphere secondary to known atrial fibrillation not on anticoagulant CT angiogram of the head and neck with right MCA occlusion with collaterals LDL 106 hemoglobin A1c 5.9.The patient is a current patient of Dr. Erlinda Hong  who is out of the office today . This note is sent to the work in doctor.      PLAN: Stressed the importance of management of risk factors to prevent further stroke Continue Xarelto for secondary stroke prevention  and atrial fibrillation Maintain strict control of hypertension with blood pressure goal below 130/90, today's reading 125/76 continue antihypertensive medications Control of diabetes with hemoglobin A1c below 6.5 followed by primary care most recent hemoglobin A1c5.9 Cholesterol with LDL cholesterol less than 70, followed by primary care,  most recent 106 continue statin drugs zocor Exercise by walking,  eat  healthy diet with whole grains,  fresh fruits and vegetables Discussed risk for recurrent stroke/ TIA and answered additional questions F/U in 3 months This was a visit requiring 30 minutes and with extensive review of history, hospital chart, counseling and answering questions Dennie Bible, [facility], [facility], [facility] [address], [place], [place] [address] [phone]

## 2016-06-23 NOTE — Patient Instructions (Addendum)
Stressed the importance of management of risk factors to prevent further stroke Continue Xarelto for secondary stroke prevention  Maintain strict control of hypertension with blood pressure goal below 130/90, today's reading 125/76 continue antihypertensive medications Control of diabetes with hemoglobin A1c below 6.5 followed by primary care most recent hemoglobin A1c5.9 Cholesterol with LDL cholesterol less than 70, followed by primary care,  most recent 106 continue statin drugs zocor Exercise by walking,  eat healthy diet with whole grains,  fresh fruits and vegetables Discussed risk for recurrent stroke/ TIA and answered additional questions F/U in 3 months

## 2016-07-02 ENCOUNTER — Encounter: Payer: Self-pay | Admitting: Internal Medicine

## 2016-07-04 ENCOUNTER — Encounter: Payer: Self-pay | Admitting: Nurse Practitioner

## 2016-07-16 ENCOUNTER — Encounter: Payer: Self-pay | Admitting: Nurse Practitioner

## 2016-07-16 ENCOUNTER — Ambulatory Visit (INDEPENDENT_AMBULATORY_CARE_PROVIDER_SITE_OTHER): Payer: Medicare Other | Admitting: Nurse Practitioner

## 2016-07-16 VITALS — BP 100/62 | HR 60 | Ht 75.0 in | Wt 225.8 lb

## 2016-07-16 DIAGNOSIS — Z7901 Long term (current) use of anticoagulants: Secondary | ICD-10-CM

## 2016-07-16 DIAGNOSIS — I639 Cerebral infarction, unspecified: Secondary | ICD-10-CM | POA: Diagnosis not present

## 2016-07-16 DIAGNOSIS — Z86010 Personal history of colonic polyps: Secondary | ICD-10-CM | POA: Diagnosis not present

## 2016-07-16 NOTE — Patient Instructions (Signed)
We have changed your colonoscopy recall date to 11/2016   Follow up as needed

## 2016-07-17 NOTE — Progress Notes (Addendum)
Agree with Ms. Guenther's assessment and plan. Could go 6-7 years from last colonoscopy I suspect - will look at his chart again when I get recall notice  Gatha Mayer, MD, [facility]

## 2016-07-17 NOTE — Progress Notes (Signed)
HPI:  Patient is a 75 year old male known to Dr. Carlean Purl. He has a history of adenomatous colon polyps and is due for his surveillance colonoscopy. Patient has a history of atrial fibrillation. Unfortunately in mid October he had a stroke felt to be secondary to an embolic event from atrial fibrillation. Patient is now on Xarelto.  Patient has no gastrointestinal complaints. Specifically, he has no bowel changes, blood in stool, abdominal pain or weight loss.  Past Medical History:  Diagnosis Date  . Adenomatous colon polyp   . Arthritis 09-10-11   rt. hip, knee, hands  . Atrial fibrillation ([place]) 09-10-11   Chronic A. Fib.-controls with med and Aspirin  . Cancer ([place]) 09-10-11   dx. Prostate cancer-bx. done 12'12, surgery planned  . Diverticulosis   . Hemorrhoid   . Hyperlipidemia   . Hypertension   . 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 etiology  . Peripheral neuropathy ([place])   . Prostate cancer ([place])   . Stroke [facility])      Past Surgical History:  Procedure Laterality Date  . COLONOSCOPY  04/2003, 06/05/2011   diverticulosis, internal and external hemorrhoids 2004 and 2012, 2 small polyps 2012  . EYE SURGERY  09-10-11   Only stitches to close cut, and observation of hematoma from injury  . ROBOT ASSISTED LAPAROSCOPIC RADICAL PROSTATECTOMY  09/15/2011   Procedure: ROBOTIC ASSISTED LAPAROSCOPIC RADICAL PROSTATECTOMY LEVEL 2;  Surgeon: Dutch Gray, MD;  Location: [facility];  Service: Urology;  Laterality: N/A;        . TONSILLECTOMY     Family History  Problem Relation Age of Onset  . Diabetes Mother   . Hypertension Mother   . Diverticulosis Mother   . Heart disease Father 45    myoc infarction  . Prostate cancer Brother   . Colon cancer Neg Hx    Social History  Substance Use Topics  . Smoking status: Former Smoker    Quit date: 08/04/1968  . Smokeless tobacco: Never Used  . Alcohol use No     Comment: none in 20 yrs   Current Outpatient Prescriptions    Medication Sig Dispense Refill  . acetaminophen (TYLENOL) 500 MG tablet Take 500 mg by mouth every 6 (six) hours as needed for mild pain.    . Cholecalciferol (VITAMIN D PO) Take 1 tablet by mouth daily. 1000units daily    . metoprolol succinate (TOPROL-XL) 50 MG 24 hr tablet Take 1 tablet (50 mg total) by mouth daily. take 1 tablet by mouth once daily 30 tablet 5  . ranitidine (ZANTAC) 150 MG tablet Take 1 tablet (150 mg total) by mouth 2 (two) times daily. 180 tablet 3  . rivaroxaban (XARELTO) 20 MG TABS tablet Take 1 tablet (20 mg total) by mouth daily with supper. 30 tablet 5  . simvastatin (ZOCOR) 20 MG tablet Take 1 tablet (20 mg total) by mouth daily. 90 tablet 1   No current facility-administered medications for this visit.    Allergies  Allergen Reactions  . Ciprofloxacin Nausea Only  . Flagyl [Metronidazole] Nausea Only     Review of Systems: All systems reviewed and negative except where noted in HPI.    Physical Exam: BP 100/62   Pulse 60   Ht 6\' 3"  (1.905 m)   Wt 225 lb 12.8 oz (102.4 kg)   BMI 28.22 kg/m  Constitutional:  Well-developed, White male in no acute distress. Psychiatric: Normal mood and affect. Behavior is normal. HEENT: Normocephalic  and atraumatic. Conjunctivae are normal. No scleral icterus. Neck supple.  Cardiovascular: Normal rate, regular rhythm.  Pulmonary/chest: Effort normal and breath sounds normal. No wheezing, rales or rhonchi. Abdominal: Soft, nondistended, nontender. Bowel sounds active throughout. There are no masses palpable. No hepatomegaly. Extremities: no edema Lymphadenopathy: No cervical adenopathy noted. Neurological: Alert and oriented to person place and time. No focal neurologic deficits noted Skin: Skin is warm and dry. No rashes noted.   ASSESSMENT AND PLAN:  75 year old male with history of adenomatous colon polyps due for surveillance colonoscopy. Patient had a stroke mid October which was felt to be secondary to an  embolic event related to atrial fibrillation. On Xarelto. Patient has no GI complaints. No alarm symptoms. Given that patient is only 2 months post CVA, his colonoscopy will need to be postponed. Will change his colonoscopy recall date to April 2018.We will reassess patient prior to the procedure to make sure that it is appropriate to hold Xarelto.   A-Fib, Chadsvasc 4. On Metroprolol and Xarelto    Tye Savoy, NP  07/17/2016, 4:19 PM  Cc: [name], MD

## 2016-08-01 ENCOUNTER — Telehealth: Payer: Self-pay | Admitting: *Deleted

## 2016-08-01 NOTE — Telephone Encounter (Signed)
Patient came in office states that he went to pharmacy and they states that the insurance company is requiring a prior auth for the dosage of Xarelto he is taking. Patient has a few tablets left to last until next week. If you have any questions the patient states you can call him. His number is [phone].  Please advise provider. Thank you .

## 2016-08-01 NOTE — Telephone Encounter (Signed)
Please advise Pt is need of PA.

## 2016-08-06 NOTE — Telephone Encounter (Signed)
PA is approved, pharmacy is getting medication ready for patient.

## 2016-08-13 ENCOUNTER — Ambulatory Visit (INDEPENDENT_AMBULATORY_CARE_PROVIDER_SITE_OTHER): Payer: Medicare Other | Admitting: Family Medicine

## 2016-08-13 ENCOUNTER — Encounter: Payer: Self-pay | Admitting: Family Medicine

## 2016-08-13 VITALS — BP 118/70 | HR 65 | Temp 98.3°F | Ht 74.25 in | Wt 223.8 lb

## 2016-08-13 DIAGNOSIS — N393 Stress incontinence (female) (male): Secondary | ICD-10-CM | POA: Diagnosis not present

## 2016-08-13 DIAGNOSIS — R739 Hyperglycemia, unspecified: Secondary | ICD-10-CM

## 2016-08-13 DIAGNOSIS — I10 Essential (primary) hypertension: Secondary | ICD-10-CM | POA: Diagnosis not present

## 2016-08-13 DIAGNOSIS — C61 Malignant neoplasm of prostate: Secondary | ICD-10-CM | POA: Diagnosis not present

## 2016-08-13 DIAGNOSIS — E785 Hyperlipidemia, unspecified: Secondary | ICD-10-CM

## 2016-08-13 DIAGNOSIS — I482 Chronic atrial fibrillation: Secondary | ICD-10-CM

## 2016-08-13 DIAGNOSIS — Z0000 Encounter for general adult medical examination without abnormal findings: Secondary | ICD-10-CM

## 2016-08-13 DIAGNOSIS — N183 Chronic kidney disease, stage 3 unspecified: Secondary | ICD-10-CM

## 2016-08-13 DIAGNOSIS — N5201 Erectile dysfunction due to arterial insufficiency: Secondary | ICD-10-CM | POA: Diagnosis not present

## 2016-08-13 DIAGNOSIS — I4821 Permanent atrial fibrillation: Secondary | ICD-10-CM

## 2016-08-13 LAB — TSH: TSH: 0.98 u[IU]/mL (ref 0.35–4.50)

## 2016-08-13 LAB — COMPREHENSIVE METABOLIC PANEL
ALBUMIN: 3.8 g/dL (ref 3.5–5.2)
ALK PHOS: 78 U/L (ref 39–117)
ALT: 19 U/L (ref 0–53)
AST: 18 U/L (ref 0–37)
BUN: 23 mg/dL (ref 6–23)
CO2: 26 mEq/L (ref 19–32)
Calcium: 9.2 mg/dL (ref 8.4–10.5)
Chloride: 108 mEq/L (ref 96–112)
Creatinine, Ser: 1.49 mg/dL (ref 0.40–1.50)
GFR: 48.85 mL/min — ABNORMAL LOW (ref >60.00)
Glucose, Bld: 89 mg/dL (ref 70–99)
POTASSIUM: 5.3 meq/L — AB (ref 3.5–5.1)
SODIUM: 141 meq/L (ref 135–145)
TOTAL PROTEIN: 6.3 g/dL (ref 6.0–8.3)
Total Bilirubin: 0.8 mg/dL (ref 0.2–1.2)

## 2016-08-13 LAB — CBC
HEMATOCRIT: 45.2 % (ref 39.0–52.0)
HEMOGLOBIN: 15.7 g/dL (ref 13.0–17.0)
MCHC: 34.8 g/dL (ref 30.0–36.0)
MCV: 90.1 fl (ref 78.0–100.0)
Platelets: 211 10*3/uL (ref 150.0–400.0)
RBC: 5.02 Mil/uL (ref 4.22–5.81)
RDW: 13.9 % (ref 11.5–15.5)
WBC: 7.4 10*3/uL (ref 4.0–10.5)

## 2016-08-13 LAB — LDL CHOLESTEROL, DIRECT: LDL DIRECT: 75 mg/dL

## 2016-08-13 LAB — PSA: PSA: <0.015

## 2016-08-13 NOTE — Progress Notes (Addendum)
Phone: [phone]  Subjective:  Patient presents today for their annual wellness visit.    Preventive Screening-Counseling & Management  Smoking Status: former Smoker quit 1970 Second Hand Smoking status: No smokers in home  Risk Factors Regular exercise: going to [facility] almost every day of week at least 30 minutes Diet: good balanced diet with fruits/veggies. Has to cut down on portions  Fall Risk: None  Fall Risk  02/07/2016 12/06/2014 10/18/2013  Falls in the past year? No Yes No  Number falls in past yr: - 1 -  Injury with Fall? - Yes -    Cardiac risk factors:  advanced age (older than 55 for men, 72 for women)  Hyperlipidemia - but on statin No diabetes.  HTN controlled   Depression Screen None. PHQ2 0  Depression screen [facility] 2/9 02/07/2016 12/06/2014 10/18/2013  Decreased Interest 0 0 0  Down, Depressed, Hopeless 0 0 0  PHQ - 2 Score 0 0 0    Activities of Daily Living Independent ADLs and IADLs   Hearing Difficulties: -patient endorses mild issues left ear, declines follow up for this  Cognitive Testing No reported trouble.   Normal 3 word recall  List the Names of Other Physician/Practitioners you currently use: -Dr. Dwyane Dee endocrinology- following nodule -Dr. Rayann Heman cardiology - Evlyn Courier, NP neurology - Dr. Alinda Money urology- sees today- will get PSA  Immunization History  Administered Date(s) Administered  . Influenza Split 05/04/2012  . Pneumococcal Polysaccharide-23 07/15/2007  . Td 04/05/2003  . Tetanus 06/05/2014   Required Immunizations needed today - declines flu shot  Screening tests- up to date Health Maintenance Due  Topic Date Due  . INFLUENZA VACCINE - declines 03/04/2016  . COLONOSCOPY - will see GI again to decide on repeat- got postponed because of xarelto 06/02/2016   ROS- No pertinent positives discovered in course of AWV ROS- No chest pain or shortness of breath. No headache or blurry vision.   The following were  reviewed and entered/updated in epic: Past Medical History:  Diagnosis Date  . Adenomatous colon polyp   . Arthritis 09-10-11   rt. hip, knee, hands  . Atrial fibrillation ([place]) 09-10-11   Chronic A. Fib.-controls with med and Aspirin  . Cancer ([place]) 09-10-11   dx. Prostate cancer-bx. done 12'12, surgery planned  . Diverticulosis   . Hemorrhoid   . Hyperlipidemia   . Hypertension   . 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 etiology  . Peripheral neuropathy ([place])   . Prostate cancer ([place])   . Stroke [facility])    Patient Active Problem List   Diagnosis Date Noted  . History of CVA (cerebrovascular accident) 05/14/2016    Priority: High  . Thyroid nodule     Priority: High  . Melanoma of skin ([place]) 12/04/2015    Priority: High  . Atrial fibrillation ([place]) 04/06/2007    Priority: High  . Hyperglycemia 08/13/2016    Priority: Medium  . GERD (gastroesophageal reflux disease) 12/04/2015    Priority: Medium  . CKD (chronic kidney disease), stage III 12/07/2014    Priority: Medium  . History of prostate cancer 09/23/2012    Priority: Medium  . 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 Essential hypertension 04/07/2007    Priority: Medium  . Hyperlipidemia 04/06/2007    Priority: Medium  . Osteoarthritis of left hip 08/09/2014    Priority: Low  . Actinic keratosis 06/05/2014    Priority: Low   Past Surgical History:  Procedure Laterality Date  . COLONOSCOPY  04/2003, 06/05/2011   diverticulosis, internal and external hemorrhoids 2004 and 2012, 2 small polyps 2012  . EYE SURGERY  09-10-11   Only stitches to close cut, and observation of hematoma from injury  . ROBOT ASSISTED LAPAROSCOPIC RADICAL PROSTATECTOMY  09/15/2011   Procedure: ROBOTIC ASSISTED LAPAROSCOPIC RADICAL PROSTATECTOMY LEVEL 2;  Surgeon: Dutch Gray, MD;  Location: [facility];  Service: Urology;  Laterality: N/A;        . TONSILLECTOMY      Family History  Problem Relation Age  of Onset  . Diabetes Mother   . Hypertension Mother   . Diverticulosis Mother   . Heart disease Father 76    myoc infarction  . Prostate cancer Brother   . Colon cancer Neg Hx     Medications- reviewed and updated Current Outpatient Prescriptions  Medication Sig Dispense Refill  . acetaminophen (TYLENOL) 500 MG tablet Take 500 mg by mouth every 6 (six) hours as needed for mild pain.    . Cholecalciferol (VITAMIN D PO) Take 1 tablet by mouth daily. 1000units daily    . metoprolol succinate (TOPROL-XL) 50 MG 24 hr tablet Take 1 tablet (50 mg total) by mouth daily. take 1 tablet by mouth once daily 30 tablet 5  . ranitidine (ZANTAC) 150 MG tablet Take 1 tablet (150 mg total) by mouth 2 (two) times daily. 180 tablet 3  . rivaroxaban (XARELTO) 20 MG TABS tablet Take 1 tablet (20 mg total) by mouth daily with supper. 30 tablet 5  . simvastatin (ZOCOR) 20 MG tablet Take 1 tablet (20 mg total) by mouth daily. 90 tablet 1   No current facility-administered medications for this visit.     Allergies-reviewed and updated Allergies  Allergen Reactions  . Ciprofloxacin Nausea Only  . Flagyl [Metronidazole] Nausea Only    Social History   Social History  . Marital status: Married    Spouse name: N/A  . Number of children: 3  . Years of education: N/A   Occupational History  . Consultant Retired   Social History Main Topics  . Smoking status: Former Smoker    Quit date: 08/04/1968  . Smokeless tobacco: Never Used  . Alcohol use No     Comment: none in 20 yrs  . Drug use: No  . Sexual activity: Yes   Other Topics Concern  . Not on file   Social History Narrative   Married with 3 kids. 6 grandkids.       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       Hobbies: beach (place at [employer]) usually every 2 weeks.     Objective: BP 118/70 (BP Location: Left Arm, Patient Position: Sitting, Cuff Size: Large)   Pulse 65   Temp 98.3 F (36.8 C) (Oral)    Ht 6' 2.25" (1.886 m)   Wt 223 lb 12.8 oz (101.5 kg)   SpO2 98%   BMI 28.54 kg/m  Gen: NAD, resting comfortably HEENT: Mucous membranes are moist. Oropharynx normal CV: irregularly irregular no murmurs rubs or gallops Lungs: CTAB no crackles, wheeze, rhonchi Abdomen: soft/nontender/nondistended/normal bowel sounds. No rebound or guarding.  Ext: no edema Skin: warm, dry Neuro: normal gait, normal speech  Assessment/Plan:  AWV completed- discussed recommended screenings anddocumented any personalized health advice and referrals for preventive counseling. See AVS as well which was given to patient.   Status of chronic or acute concerns  History of melanoma- every 3-6 months  sees dermatology  Thyroid nodule- followed by Dr. Dwyane Dee- monitor for now  History of CVA/atrial fibrillation- cva but no deficits at this point fortunately. Now on xarelto- embolic cause of CVA  GERD- controlled with zantac  Intermittent cough during this winter season- very minor. Chest clear. Return precautions given.   Hyperlipidemia S: hopefully controlled on simvastatin 20mg - increase after CVA. No myalgias.  Lab Results  Component Value Date   CHOL 160 05/14/2016   HDL 35 (L) 05/14/2016   LDLCALC 106 (H) 05/14/2016   LDLDIRECT 89.0 12/06/2014   TRIG 97 05/14/2016   CHOLHDL 4.6 05/14/2016   A/P: update LDL today   Hyperglycemia S: during last workup for CVA had elevated a1c at 5.9 A/P: advised weight loss- target 10 lbs. Update at least yearly   CKD (chronic kidney disease), stage III S: GFR around 50. BP controlled. Lipids working on. Avoids nsaids- uses tylenol instead A/P: update labs today   Essential hypertension S: controlled on metoprolol alone.  BP Readings from Last 3 Encounters:  08/13/16 118/70  07/16/16 100/62  06/23/16 125/76  A/P:Continue current meds:  Doing well- HR looks good as well    Atrial fibrillation (HCC) S: rate controlled with metoprolol. Anticoagulated  with xarelto 20mg  A/P: doing well- continue current medicines   Orders Placed This Encounter  Procedures  . CBC    [place]  . Comprehensive metabolic panel    [place]  . LDL cholesterol, direct    [place]  . TSH       Return precautions advised.  [name], MD

## 2016-08-13 NOTE — Assessment & Plan Note (Signed)
S: GFR around 50. BP controlled. Lipids working on. Avoids nsaids- uses tylenol instead A/P: update labs today

## 2016-08-13 NOTE — Assessment & Plan Note (Signed)
S: during last workup for CVA had elevated a1c at 5.9 A/P: advised weight loss- target 10 lbs. Update at least yearly

## 2016-08-13 NOTE — Patient Instructions (Addendum)
  Mr. Winterhalter , Thank you for taking time to come for your Medicare Wellness Visit. I appreciate your ongoing commitment to your health goals. Please review the following plan we discussed and let me know if I can assist you in the future.   These are the goals we discussed: 1. Goal 10 lbs weight loss. You also have an increased risk for diabetes based off labs at the hospital so weight loss strongly advisable. Recheck at least yearly.  2. Labs before you leave   This is a list of the screening recommended for you and due dates:  Health Maintenance  Topic Date Due  . Flu Shot  03/04/2016  . Colon Cancer Screening  06/02/2016  . Pneumonia vaccines (2 of 2 - PCV13) 12/05/2016*  . Shingles Vaccine  07/04/2044*  . Tetanus Vaccine  06/05/2024  *Topic was postponed. The date shown is not the original due date.

## 2016-08-13 NOTE — Assessment & Plan Note (Signed)
S: rate controlled with metoprolol. Anticoagulated with xarelto 20mg  A/P: doing well- continue current medicines

## 2016-08-13 NOTE — Progress Notes (Signed)
Pre visit review using our clinic review tool, if applicable. No additional management support is needed unless otherwise documented below in the visit note. 

## 2016-08-13 NOTE — Assessment & Plan Note (Signed)
S: hopefully controlled on simvastatin 20mg - increase after CVA. No myalgias.  Lab Results  Component Value Date   CHOL 160 05/14/2016   HDL 35 (L) 05/14/2016   LDLCALC 106 (H) 05/14/2016   LDLDIRECT 89.0 12/06/2014   TRIG 97 05/14/2016   CHOLHDL 4.6 05/14/2016   A/P: update LDL today

## 2016-08-13 NOTE — Assessment & Plan Note (Signed)
S: controlled on metoprolol alone.  BP Readings from Last 3 Encounters:  08/13/16 118/70  07/16/16 100/62  06/23/16 125/76  A/P:Continue current meds:  Doing well- HR looks good as well

## 2016-08-14 ENCOUNTER — Other Ambulatory Visit: Payer: Self-pay

## 2016-08-14 DIAGNOSIS — E875 Hyperkalemia: Secondary | ICD-10-CM

## 2016-08-18 ENCOUNTER — Encounter: Payer: Self-pay | Admitting: Family Medicine

## 2016-08-22 ENCOUNTER — Other Ambulatory Visit (INDEPENDENT_AMBULATORY_CARE_PROVIDER_SITE_OTHER): Payer: Medicare Other

## 2016-08-22 DIAGNOSIS — E875 Hyperkalemia: Secondary | ICD-10-CM | POA: Diagnosis not present

## 2016-08-22 LAB — POTASSIUM: POTASSIUM: 4.4 meq/L (ref 3.5–5.1)

## 2016-08-27 DIAGNOSIS — Z85820 Personal history of malignant melanoma of skin: Secondary | ICD-10-CM | POA: Diagnosis not present

## 2016-08-27 DIAGNOSIS — Z08 Encounter for follow-up examination after completed treatment for malignant neoplasm: Secondary | ICD-10-CM | POA: Diagnosis not present

## 2016-08-27 DIAGNOSIS — Z1283 Encounter for screening for malignant neoplasm of skin: Secondary | ICD-10-CM | POA: Diagnosis not present

## 2016-08-27 DIAGNOSIS — C4359 Malignant melanoma of other part of trunk: Secondary | ICD-10-CM | POA: Diagnosis not present

## 2016-09-07 ENCOUNTER — Other Ambulatory Visit: Payer: Self-pay | Admitting: Family Medicine

## 2016-09-08 ENCOUNTER — Telehealth: Payer: Self-pay

## 2016-09-08 ENCOUNTER — Encounter: Payer: Self-pay | Admitting: Family Medicine

## 2016-09-08 NOTE — Telephone Encounter (Signed)
Received PA request for Xarelto. Tried processing PA, per insurance no PA is needed. Form faxed back to pharmacy.

## 2016-09-17 DIAGNOSIS — C4359 Malignant melanoma of other part of trunk: Secondary | ICD-10-CM | POA: Diagnosis not present

## 2016-09-17 DIAGNOSIS — L98429 Non-pressure chronic ulcer of back with unspecified severity: Secondary | ICD-10-CM | POA: Diagnosis not present

## 2016-10-03 ENCOUNTER — Ambulatory Visit (INDEPENDENT_AMBULATORY_CARE_PROVIDER_SITE_OTHER): Payer: Medicare Other | Admitting: Endocrinology

## 2016-10-03 ENCOUNTER — Encounter: Payer: Self-pay | Admitting: Endocrinology

## 2016-10-03 VITALS — BP 118/64 | HR 58 | Ht 74.0 in | Wt 219.0 lb

## 2016-10-03 DIAGNOSIS — E042 Nontoxic multinodular goiter: Secondary | ICD-10-CM

## 2016-10-03 NOTE — Progress Notes (Signed)
Patient ID: Elijah [name], male   DOB: 1940-08-21, 76 y.o.   MRN: [id number]            Reason for Appointment: Evaluation of thyroid nodule    History of Present Illness:   The patient's thyroid nodule was first discovered when he was having CT angiography for his cerebrovascular disease The thyroid ultrasound on 05/14/16 showed the following Has a dominant nodule, location Left; Superior Size: Measures 3.7 x 2.0 x 4.3 cm, Composition: solid/almost completely solid (2) Echogenicity: isoechoic (1) Also has 6-10 nodules over 1 cm bilaterally  He does not complain of any difficulty swallowing, pressure or choking his neck  He was not referred for a biopsy because of being on Xarelto and was supposed to check with his neurologist about temporarily holding this for a biopsy but he did not call back  Patient again does not feel any swelling on his neck, he continues to be on Xarelto for secondary stroke prevention, likely long-term Thyroid levels have been normal   Lab Results  Component Value Date   FREET4 1.12 05/22/2016   TSH 0.98 08/13/2016   TSH 0.74 05/22/2016   TSH 1.21 12/13/2014    Allergies as of 10/03/2016      Reactions   Ciprofloxacin Nausea Only   Flagyl [metronidazole] Nausea Only      Medication List       Accurate as of 10/03/16 11:01 AM. Always use your most recent med list.          acetaminophen 500 MG tablet Commonly known as:  TYLENOL Take 500 mg by mouth every 6 (six) hours as needed for mild pain.   metoprolol succinate 50 MG 24 hr tablet Commonly known as:  TOPROL-XL take 1 tablet by mouth once daily   ranitidine 150 MG tablet Commonly known as:  ZANTAC Take 1 tablet (150 mg total) by mouth 2 (two) times daily.   rivaroxaban 20 MG Tabs tablet Commonly known as:  XARELTO Take 1 tablet (20 mg total) by mouth daily with supper.   simvastatin 20 MG tablet Commonly known as:  ZOCOR Take 1 tablet (20 mg total) by mouth daily.   VITAMIN D  PO Take 1 tablet by mouth daily. 1000units daily       Allergies:  Allergies  Allergen Reactions  . Ciprofloxacin Nausea Only  . Flagyl [Metronidazole] Nausea Only    Past Medical History:  Diagnosis Date  . Adenomatous colon polyp   . Arthritis 09-10-11   rt. hip, knee, hands  . Atrial fibrillation ([place]) 09-10-11   Chronic A. Fib.-controls with med and Aspirin  . Cancer ([place]) 09-10-11   dx. Prostate cancer-bx. done 12'12, surgery planned  . Diverticulosis   . Hemorrhoid   . Hyperlipidemia   . Hypertension   . 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 etiology  . Peripheral neuropathy ([place])   . Prostate cancer ([place])   . Stroke [facility])     There is no history of radiation to the neck in childhood  Past Surgical History:  Procedure Laterality Date  . COLONOSCOPY  04/2003, 06/05/2011   diverticulosis, internal and external hemorrhoids 2004 and 2012, 2 small polyps 2012  . EYE SURGERY  09-10-11   Only stitches to close cut, and observation of hematoma from injury  . ROBOT ASSISTED LAPAROSCOPIC RADICAL PROSTATECTOMY  09/15/2011   Procedure: ROBOTIC ASSISTED LAPAROSCOPIC RADICAL PROSTATECTOMY LEVEL 2;  Surgeon: Dutch Gray, MD;  Location: [facility];  Service: Urology;  Laterality:  N/A;        . TONSILLECTOMY      Family History  Problem Relation Age of Onset  . Diabetes Mother   . Hypertension Mother   . Diverticulosis Mother   . Heart disease Father 70    myoc infarction  . Prostate cancer Brother   . Colon cancer Neg Hx     Social History:  reports that he quit smoking about 48 years ago. He has never used smokeless tobacco. He reports that he does not drink alcohol or use drugs.   Review of Systems:  Wt Readings from Last 3 Encounters:  10/03/16 219 lb (99.3 kg)  08/13/16 223 lb 12.8 oz (101.5 kg)  07/16/16 225 lb 12.8 oz (102.4 kg)    Examination:   BP 118/64   Pulse (!) 58   Ht 6\' 2"  (1.88 m)   Wt 219 lb (99.3 kg)   SpO2 96%   BMI 28.12 kg/m             THYROID:  Right lobe of thyroid is about 3 times normal, felt mostly on swallowing, smooth and slightly firm.  Left lobe is palpable mostly medially and is more difficult to palpate, difficult to estimate size No lymphadenopathy in the neck   Assessment/Plan:  Multinodular goiter with normal thyroid functions:  He does have a dominant 3.7 cm nodule on the left side which is isoechoic  Still unsure whether he can stop Xarelto to do any last patient biopsy This will need to be cleared from his neurologist  For now will proceed with the follow-up ultrasound since it will be about 6 months in April since his original ultrasound and decide on biopsy based on any change in the nodule  He is going to be seeing his neurologist later this month and he can ask her when it would be safe to stop Xarelto  to do the biopsy    [facility] 10/03/2016  Note: This office note was prepared with Dragon voice recognition system technology. Any transcriptional errors that result from this process are unintentional.

## 2016-10-08 ENCOUNTER — Encounter: Payer: Self-pay | Admitting: Nurse Practitioner

## 2016-10-08 ENCOUNTER — Ambulatory Visit (INDEPENDENT_AMBULATORY_CARE_PROVIDER_SITE_OTHER): Payer: Medicare Other | Admitting: Nurse Practitioner

## 2016-10-08 VITALS — BP 130/80 | HR 50 | Ht 74.0 in | Wt 221.2 lb

## 2016-10-08 DIAGNOSIS — I480 Paroxysmal atrial fibrillation: Secondary | ICD-10-CM | POA: Diagnosis not present

## 2016-10-08 DIAGNOSIS — E785 Hyperlipidemia, unspecified: Secondary | ICD-10-CM

## 2016-10-08 DIAGNOSIS — I639 Cerebral infarction, unspecified: Secondary | ICD-10-CM | POA: Diagnosis not present

## 2016-10-08 DIAGNOSIS — I10 Essential (primary) hypertension: Secondary | ICD-10-CM | POA: Diagnosis not present

## 2016-10-08 NOTE — Progress Notes (Signed)
I reviewed above note and agree with the assessment and plan.  Rosalin Hawking, MD PhD Stroke Neurology 10/08/2016 6:01 PM

## 2016-10-08 NOTE — Progress Notes (Signed)
[facility]  Elijah [name]: Elijah Elijah [name] DOB: 1941-02-25   REASON FOR VISIT:  follow-up for stroke, with risk factors of atrial fibrillation hyperlipidemia and hypertension HISTORY FROM: Elijah [name]    HISTORY OF PRESENT ILLNESS:UPDATE 03/07/2018CM Elijah Elijah [name], 76 year old male returns for follow-up for stroke event in October 2017.MRI scan of Elijah brain was obtained and showed acute ischemic changes in Elijah right cerebral hemisphere secondary to known atrial fibrillation. He is currently on Xarelto for secondary stroke prevention and atrial fibrillation. He denies any bruising or bleeding. He denies any further stroke or TIA symptoms. In addition he is on Zocor for hyperlipidemia. He denies any muscle aches or myalgias. Blood pressure well controlled in Elijah office today at 130/80. He continues to be active he walks at [facility] [employer] every day, he lifts weights 3 times a week. He denies any falls or balance issues. He does have a history of peripheral neuropathy. He returns for reevaluation  HISTORY 06/23/16 CMThis is a 76 year old right-handed man who was noted by his wife to have some facial weakness on Elijah afternoon of 05/13/16. Elijah Elijah [name] himself was unaware of this and so most information is obtained from review of Elijah medical records.  It appears that Elijah Elijah [name]'s wife went shopping around 2 PM on Elijah afternoon of 05/13/16 ([facility]). When she returned home at approximately 4 PM, she feels as if Elijah right side of Elijah Elijah [name]'s mouth was drooping but also stated that he was drooling out of Elijah left side. She noted that he had some possible slurred speech and that his right eye seemed to be nearly closed. She took Elijah Elijah [name] to his PCP where he was noted to have a normal neurological examination. However, due to concerns about possible TIA or stroke, he was sent to Elijah emergency department for further evaluation. In Elijah ED, MRI scan of Elijah brain was obtained and showed acute ischemic changes in Elijah  right cerebral hemisphere secondary to known atrial fibrillation not on anticoagulant CT angiogram of Elijah head and neck with right MCA occlusion with collaterals LDL 106 hemoglobin A1c 5.9. Elijah Elijah [name]'s main concern is some pain and headache. He states that he had some moderate pain around Elijah right eye yesterday afternoon. This has since resolved. He was not aware that he was having any facial weakness. He denies any weakness, numbness, slurred speech, difficulty expressing himself, vision changes, double vision, coordination problems, or balance disturbances. He has never had any prior history of focal neurological deficits. He has a known history of atrial fibrillation but has been resistant to going on anticoagulation and has been maintained on aspirin. Elijah [name] was not administered IV t-PA secondary to delay in arrival. He was admitted for further evaluation and treatment.Wife at bedside. He still has mild right facial droop but otherwise doing well.  He returns for follow-up today in Elijah stroke clinic. He remains on Xarelto with no bruising or bleeding. He has not had further stroke or TIA symptoms Dr. Dwyane Dee is following his thyroid goiter. He has long history of permanent atrial fib rate controlled and asymptomatic he also has a history of peripheral neuropathy but no recent falls. He goes to Elijah gym several times a week to work out. He returns for reevaluation REVIEW  OF SYSTEMS: Full 14 system review of systems performed and notable only for those listed, all others are neg:  Constitutional: neg  Cardiovascular: neg Ear/Nose/Throat: Some hearing loss Skin: neg Eyes: neg Respiratory: neg Gastroitestinal: neg  Hematology/Lymphatic neg Endocrine: neg  Musculoskeletal:neg Allergy/Immunology: neg Neurological: neg Psychiatric: neg Sleep : neg   ALLERGIES: Allergies  Allergen Reactions  . Ciprofloxacin Nausea Only  . Flagyl [Metronidazole] Nausea Only    HOME MEDICATIONS: Outpatient  Medications Prior to Visit  Medication Sig Dispense Refill  . acetaminophen (TYLENOL) 500 MG tablet Take 500 mg by mouth every 6 (six) hours as needed for mild pain.    . Cholecalciferol (VITAMIN D PO) Take 1 tablet by mouth daily. 1000units daily    . metoprolol succinate (TOPROL-XL) 50 MG 24 hr tablet take 1 tablet by mouth once daily 30 tablet 5  . ranitidine (ZANTAC) 150 MG tablet Take 1 tablet (150 mg total) by mouth 2 (two) times daily. 180 tablet 3  . rivaroxaban (XARELTO) 20 MG TABS tablet Take 1 tablet (20 mg total) by mouth daily with supper. 30 tablet 5  . simvastatin (ZOCOR) 20 MG tablet Take 1 tablet (20 mg total) by mouth daily. 90 tablet 1   No facility-administered medications prior to visit.     PAST MEDICAL HISTORY: Past Medical History:  Diagnosis Date  . Adenomatous colon polyp   . Arthritis 09-10-11   rt. hip, knee, hands  . Atrial fibrillation ([place]) 09-10-11   Chronic A. Fib.-controls with med and Aspirin  . Cancer ([place]) 09-10-11   dx. Prostate cancer-bx. done 12'12, surgery planned  . Diverticulosis   . Hemorrhoid   . Hyperlipidemia   . Hypertension   . 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 etiology  . Peripheral neuropathy ([place])   . Prostate cancer ([place])   . Stroke [facility])     PAST SURGICAL HISTORY: Past Surgical History:  Procedure Laterality Date  . COLONOSCOPY  04/2003, 06/05/2011   diverticulosis, internal and external hemorrhoids 2004 and 2012, 2 small polyps 2012  . EYE SURGERY  09-10-11   Only stitches to close cut, and observation of hematoma from injury  . ROBOT ASSISTED LAPAROSCOPIC RADICAL PROSTATECTOMY  09/15/2011   Procedure: ROBOTIC ASSISTED LAPAROSCOPIC RADICAL PROSTATECTOMY LEVEL 2;  Surgeon: Dutch Gray, MD;  Location: [facility];  Service: Urology;  Laterality: N/A;        . TONSILLECTOMY      FAMILY HISTORY: Family History  Problem Relation Age of Onset  . Diabetes Mother   . Hypertension Mother   . Diverticulosis Mother   . Heart  disease Father 52    myoc infarction  . Prostate cancer Brother   . Colon cancer Neg Hx     SOCIAL HISTORY: Social History   Social History  . Marital status: Married    Spouse name: N/A  . Number of children: 3  . Years of education: N/A   Occupational History  . Consultant Retired   Social History Main Topics  . Smoking status: Former Smoker    Quit date: 08/04/1968  . Smokeless tobacco: Never Used  . Alcohol use No     Comment: none in 20 yrs  . Drug use: No  . Sexual activity: Yes   Other Topics Concern  . Not on file   Social History Narrative   Married with 3 kids. 6 grandkids.       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       Hobbies: beach (place at [employer]) usually every 2 weeks.      PHYSICAL EXAM B/P 130/80 P 50  Body mass index is 28.4 kg/m.  Generalized: Well developed, Mildly obese male in no acute distress  Head: normocephalic and atraumatic,. Oropharynx benign  Neck: Supple, no carotid bruits  Cardiac: Irregularly irregular rate rhythm,  Musculoskeletal: No deformity   Neurological examination   Mentation: Alert oriented to time, place, history taking. Attention span and concentration appropriate. Recent and remote memory intact.  Follows all commands speech and language fluent.   Cranial nerve II-XII: .Pupils were equal round reactive to light extraocular movements were full, visual field were full on confrontational test. Facial sensation and strength were normal. hearing was intact to finger rubbing bilaterally. Uvula tongue midline. head turning and shoulder shrug were normal and symmetric.Tongue protrusion into cheek strength was normal. Motor: normal bulk and tone, full strength in Elijah BUE, BLE, fine finger movements normal, no pronator drift. Sensory: normal and symmetric to light touch, pinprick, and  Vibration, in Elijah upper and lower extremities  Coordination: finger-nose-finger, heel-to-shin bilaterally,  no dysmetria, no tremor Reflexes1+ upper lower and symmetric plantar responses were flexor bilaterally. Gait and Station: Rising up from seated position without assistance, normal stance,  moderate stride, good arm swing, smooth turning, able to perform tiptoe, and heel walking without difficulty. Tandem gait is steady. No assistive device  DIAGNOSTIC DATA (LABS, IMAGING, TESTING) - I reviewed Elijah [name] records, labs, notes, testing and imaging myself where available.  Lab Results  Component Value Date   WBC 7.4 08/13/2016   HGB 15.7 08/13/2016   HCT 45.2 08/13/2016   MCV 90.1 08/13/2016   PLT 211.0 08/13/2016      Component Value Date/Time   NA 141 08/13/2016 0910   K 4.4 08/22/2016 1105   CL 108 08/13/2016 0910   CO2 26 08/13/2016 0910   GLUCOSE 89 08/13/2016 0910   GLUCOSE 92 06/11/2006 1031   BUN 23 08/13/2016 0910   CREATININE 1.49 08/13/2016 0910   CALCIUM 9.2 08/13/2016 0910   PROT 6.3 08/13/2016 0910   ALBUMIN 3.8 08/13/2016 0910   AST 18 08/13/2016 0910   ALT 19 08/13/2016 0910   ALKPHOS 78 08/13/2016 0910   BILITOT 0.8 08/13/2016 0910   GFRNONAA 48 (L) 05/15/2016 0331   GFRAA 56 (L) 05/15/2016 0331   Lab Results  Component Value Date   CHOL 160 05/14/2016   HDL 35 (L) 05/14/2016   LDLCALC 106 (H) 05/14/2016   LDLDIRECT 75.0 08/13/2016   TRIG 97 05/14/2016   CHOLHDL 4.6 05/14/2016   Lab Results  Component Value Date   HGBA1C 5.9 (H) 05/14/2016   No results found for: VITAMINB12 Lab Results  Component Value Date   TSH 0.98 08/13/2016      ASSESSMENT AND PLAN  76 y.o. year old male  has a past medical history of  Atrial fibrillation ([place]) (09-10-11);  Hyperlipidemia; Hypertension; Neuropathy of foot (09-10-11); Peripheral neuropathy ([place]); and Prostate cancer ([place]). here for  follow-up for stroke. MRI scan of Elijah brain showed acute ischemic changes in Elijah right cerebral hemisphere secondary to known atrial fibrillation not on anticoagulant CT angiogram of  Elijah head and neck with right MCA occlusion with collaterals.  PLAN: Stressed Elijah importance of continued management of risk factors to prevent further stroke Continue Xarelto for secondary stroke prevention  and atrial fibrillation Maintain strict control of hypertension with blood pressure goal below 130/90, today's reading 130/80 continue antihypertensive medications Cholesterol with LDL cholesterol less than 70, followed by primary care,   continue statin drugs zocor Exercise by walking as you are currently doing, ,  eat healthy diet with whole grains,  fresh fruits and vegetables Discussed risk for recurrent  stroke/ TIA and answered additional questions, I discussed risk increased  Elijah first 6 months F/U in 6 months Dennie Bible, [facility], [facility], APRN  [facility] [address], [place], [place] [address] [phone]

## 2016-10-08 NOTE — Patient Instructions (Signed)
Stressed the importance of management of risk factors to prevent further stroke Continue Xarelto for secondary stroke prevention  and atrial fibrillation Maintain strict control of hypertension with blood pressure goal below 130/90, today's reading 130/80 continue antihypertensive medications Cholesterol with LDL cholesterol less than 70, followed by primary care,   continue statin drugs zocor Exercise by walking,  eat healthy diet with whole grains,  fresh fruits and vegetables Discussed risk for recurrent stroke/ TIA and answered additional questions F/U in 6 months

## 2016-10-28 ENCOUNTER — Ambulatory Visit
Admission: RE | Admit: 2016-10-28 | Discharge: 2016-10-28 | Disposition: A | Discharge status: Home / Self Care | Payer: Medicare Other | Source: Ambulatory Visit | Attending: Endocrinology | Admitting: Endocrinology

## 2016-10-28 DIAGNOSIS — E042 Nontoxic multinodular goiter: Secondary | ICD-10-CM

## 2016-11-03 ENCOUNTER — Other Ambulatory Visit: Payer: Self-pay | Admitting: Endocrinology

## 2016-11-03 ENCOUNTER — Telehealth: Payer: Self-pay | Admitting: Endocrinology

## 2016-11-03 ENCOUNTER — Encounter: Payer: Self-pay | Admitting: Endocrinology

## 2016-11-03 DIAGNOSIS — E042 Nontoxic multinodular goiter: Secondary | ICD-10-CM

## 2016-11-03 NOTE — Telephone Encounter (Signed)
Left vm requesting a call back and gave results

## 2016-11-03 NOTE — Telephone Encounter (Signed)
Patient was sent a reply on my chart

## 2016-11-03 NOTE — Telephone Encounter (Signed)
Please let him know that the left-sided nodule is increased in size, recommend that we do needle aspiration biopsy, if he agrees will send referral for this

## 2016-11-06 ENCOUNTER — Telehealth: Payer: Self-pay | Admitting: *Deleted

## 2016-11-06 ENCOUNTER — Other Ambulatory Visit: Payer: Self-pay | Admitting: Family Medicine

## 2016-11-06 NOTE — Telephone Encounter (Signed)
Juliann Pulse called from [place] [phone]) stating a thyroid biopsy has been ordered for the pt and they need permission from Dr [name] to have the pt hold the Xarelto for this procedure.  Please fax a note stating this to [facility] [place] at [phone].

## 2016-11-06 NOTE — Telephone Encounter (Signed)
May fax this back to them:  Xarelto: skip one dose on the day before the procedure, restart 24 hours after procedure.   [name], MD

## 2016-11-07 NOTE — Telephone Encounter (Signed)
MD order was faxed on 11/07/16 @0802 

## 2016-11-11 ENCOUNTER — Other Ambulatory Visit (HOSPITAL_COMMUNITY)
Admission: RE | Admit: 2016-11-11 | Discharge: 2016-11-11 | Disposition: A | Discharge status: Home / Self Care | Payer: Medicare Other | Source: Ambulatory Visit | Attending: Radiology | Admitting: Radiology

## 2016-11-11 ENCOUNTER — Ambulatory Visit
Admission: RE | Admit: 2016-11-11 | Discharge: 2016-11-11 | Disposition: A | Discharge status: Home / Self Care | Payer: Medicare Other | Source: Ambulatory Visit | Attending: Endocrinology | Admitting: Endocrinology

## 2016-11-11 DIAGNOSIS — E042 Nontoxic multinodular goiter: Secondary | ICD-10-CM | POA: Diagnosis not present

## 2016-11-11 DIAGNOSIS — E041 Nontoxic single thyroid nodule: Secondary | ICD-10-CM | POA: Diagnosis not present

## 2016-11-11 DIAGNOSIS — R921 Mammographic calcification found on diagnostic imaging of breast: Secondary | ICD-10-CM | POA: Diagnosis not present

## 2016-11-18 NOTE — Telephone Encounter (Signed)
Patient has question about his needle biospy. Please advise

## 2016-11-20 NOTE — Telephone Encounter (Signed)
Please call patient back as soon as possible, he was told to call back at 1:00 to be able to speak to someone and he was not able to when he called. He is very upset because he has been calling for several days now.  Please call him as soon as possible.

## 2016-11-20 NOTE — Progress Notes (Signed)
Had sent him a my chart message about thyroid test being benign, needs to see me back in follow-up in 6 months

## 2016-11-26 DIAGNOSIS — Z1283 Encounter for screening for malignant neoplasm of skin: Secondary | ICD-10-CM | POA: Diagnosis not present

## 2016-11-26 DIAGNOSIS — Z08 Encounter for follow-up examination after completed treatment for malignant neoplasm: Secondary | ICD-10-CM | POA: Diagnosis not present

## 2016-11-26 DIAGNOSIS — L570 Actinic keratosis: Secondary | ICD-10-CM | POA: Diagnosis not present

## 2016-11-26 DIAGNOSIS — Z85820 Personal history of malignant melanoma of skin: Secondary | ICD-10-CM | POA: Diagnosis not present

## 2016-12-08 ENCOUNTER — Other Ambulatory Visit: Payer: Self-pay | Admitting: Family Medicine

## 2016-12-08 ENCOUNTER — Encounter: Payer: Self-pay | Admitting: Family Medicine

## 2016-12-15 ENCOUNTER — Encounter: Payer: Self-pay | Admitting: Family Medicine

## 2016-12-15 ENCOUNTER — Ambulatory Visit (INDEPENDENT_AMBULATORY_CARE_PROVIDER_SITE_OTHER): Payer: Medicare Other | Admitting: Family Medicine

## 2016-12-15 VITALS — BP 130/80 | HR 63 | Temp 98.0°F | Ht 74.0 in | Wt 218.0 lb

## 2016-12-15 DIAGNOSIS — R309 Painful micturition, unspecified: Secondary | ICD-10-CM | POA: Diagnosis not present

## 2016-12-15 DIAGNOSIS — I639 Cerebral infarction, unspecified: Secondary | ICD-10-CM | POA: Diagnosis not present

## 2016-12-15 LAB — POC URINALSYSI DIPSTICK (AUTOMATED)
BILIRUBIN UA: NEGATIVE
Glucose, UA: NEGATIVE
Ketones, UA: NEGATIVE
NITRITE UA: NEGATIVE
PH UA: 6 (ref 5.0–8.0)
Spec Grav, UA: >=1.03 — AB (ref 1.010–1.025)
Urobilinogen, UA: 0.2 E.U./dL

## 2016-12-15 MED ORDER — CEPHALEXIN 500 MG PO CAPS: 500.0000 mg | ORAL_CAPSULE | Freq: Two times a day (BID) | ORAL | 0 refills | Status: AC

## 2016-12-15 NOTE — Progress Notes (Signed)
Subjective:  Elijah [name] is a 76 y.o. year old very pleasant male patient who presents for/with See problem oriented charting ROS- ROS-no fevers, chills, fatigue/malaise, nausea/vomiting, or recent weight change. Admits to dysuria and blood in urine.    Past Medical History-  Patient Active Problem List   Diagnosis Date Noted  . History of CVA (cerebrovascular accident) 05/14/2016    Priority: High  . Thyroid nodule     Priority: High  . Melanoma of skin ([place]) 12/04/2015    Priority: High  . Atrial fibrillation ([place]) 04/06/2007    Priority: High  . Hyperglycemia 08/13/2016    Priority: Medium  . GERD (gastroesophageal reflux disease) 12/04/2015    Priority: Medium  . CKD (chronic kidney disease), stage III 12/07/2014    Priority: Medium  . History of prostate cancer 09/23/2012    Priority: Medium  . 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 Essential hypertension 04/07/2007    Priority: Medium  . Hyperlipidemia 04/06/2007    Priority: Medium  . Osteoarthritis of left hip 08/09/2014    Priority: Low  . Actinic keratosis 06/05/2014    Priority: Low    Medications- reviewed and updated Current Outpatient Prescriptions  Medication Sig Dispense Refill  . acetaminophen (TYLENOL) 500 MG tablet Take 500 mg by mouth every 6 (six) hours as needed for mild pain.    . Cholecalciferol (VITAMIN D PO) Take 1 tablet by mouth daily. 1000units daily    . metoprolol succinate (TOPROL-XL) 50 MG 24 hr tablet take 1 tablet by mouth once daily 30 tablet 5  . ranitidine (ZANTAC) 150 MG tablet Take 1 tablet (150 mg total) by mouth 2 (two) times daily. 180 tablet 3  . simvastatin (ZOCOR) 20 MG tablet Take 1 tablet (20 mg total) by mouth daily. 90 tablet 1  . XARELTO 20 MG TABS tablet take 1 tablet by mouth once daily WITH SUPPER 90 tablet 2   No current facility-administered medications for this visit.     Objective: BP 130/80 (BP Location: Left Arm,  Patient Position: Sitting, Cuff Size: Large)   Pulse 63   Temp 98 F (36.7 C) (Oral)   Ht 6\' 2"  (1.88 m)   Wt 218 lb (98.9 kg)   SpO2 98%   BMI 27.99 kg/m  Gen: NAD, resting comfortably CV: RRR no murmurs rubs or gallops Lungs: CTAB no crackles, wheeze, rhonchi Abdomen: soft/nontender/nondistended/normal bowel sounds. No rebound or guarding.  No cva tenderness Ext: no edema Skin: warm, dry  Results for orders placed or performed in visit on 12/15/16 (from the past 24 hour(s))  POCT Urinalysis Dipstick (Automated)     Status: Abnormal   Collection Time: 12/15/16  3:02 PM  Result Value Ref Range   Color, UA yellow    Clarity, UA cloudy    Glucose, UA N    Bilirubin, UA N    Ketones, UA N    Spec Grav, UA >=1.030 (A) 1.010 - 1.025   Blood, UA 3+    pH, UA 6.0 5.0 - 8.0   Protein, UA 1+    Urobilinogen, UA 0.2 0.2 or 1.0 E.U./dL   Nitrite, UA N    Leukocytes, UA Small (1+) (A) Negative     Assessment/Plan:  Urinary pain - Plan: POCT Urinalysis Dipstick (Automated)  Also gross hematuria S: woke up this morning and went to urinate and near the end of it started to burn some. Then noted a couple of drops of  red blood. Still having drops of red blood and burning at the end.   Had yardsale this weekend and was very active. Admits to not staying well hydrated. At baseline does not stay very well hydrated. Describes pain as mild.   Prostatectomy 2012 with Dr. Alinda Money. Sees him once a year. Saw him in January.   Never had kidney stones.   Usually sees Dr. Alinda Money in January- history of prostate cancer s/p prostatectomy. Former smoker Product manager.  A/P: blood and leukocytes in urine. On dehydrated side- encourhaged to push hydration. No prostate so prostatitis not the cause. Possible UTI- will get culture and go ahead and start keflex 500mg  BID- lower dose due to GFR <60.   Patient aware he may need referral to urology if this does not clear completely with treatment or if urine culture  negative for infection. He is a former smoker but quit nearly 50 years ago. He is on xarelto which increases bleeding risk but will continue for now given stroke history- if ever hard cystoscopy likely would have to come off short term. Stones also a possibility.   He thinks he has had a UTI in last year- possibly with Dr. Alinda Money? None found in our record.   Meds ordered this encounter  Medications  . cephALEXin (KEFLEX) 500 MG capsule    Sig: Take 1 capsule (500 mg total) by mouth 2 (two) times daily.    Dispense:  20 capsule    Refill:  0   Orders Placed This Encounter  Procedures  . Urine culture    [facility]  . POCT Urinalysis Dipstick (Automated)   Return precautions advised- verbal and see avs [name], MD

## 2016-12-15 NOTE — Patient Instructions (Signed)
Treat as urinary tract infection given blood and infection fighting cell sin urine. If not improving by Thursday- please call us and we will try go get you into urology.   After you have finished course- reach out to [place] and we will order another urine to make sure no more blood or infection.   If no infection is found on culture- may stop antibiotic and refer you to urology  Fever, worsening pain, feeling weaker/tired, chills, or vomiting would mean we need to see you back immediately

## 2016-12-15 NOTE — Telephone Encounter (Signed)
Pt has been scheduled for this afternoon, 3 pm Now reports small amount of blood after urination, some pain with urination

## 2016-12-16 LAB — URINE CULTURE: ORGANISM ID, BACTERIA: NO GROWTH

## 2016-12-18 ENCOUNTER — Other Ambulatory Visit: Payer: Self-pay

## 2016-12-18 DIAGNOSIS — H524 Presbyopia: Secondary | ICD-10-CM | POA: Diagnosis not present

## 2016-12-18 DIAGNOSIS — H5213 Myopia, bilateral: Secondary | ICD-10-CM | POA: Diagnosis not present

## 2016-12-18 DIAGNOSIS — H25813 Combined forms of age-related cataract, bilateral: Secondary | ICD-10-CM | POA: Diagnosis not present

## 2016-12-18 DIAGNOSIS — R319 Hematuria, unspecified: Secondary | ICD-10-CM

## 2016-12-18 DIAGNOSIS — H52223 Regular astigmatism, bilateral: Secondary | ICD-10-CM | POA: Diagnosis not present

## 2016-12-19 ENCOUNTER — Other Ambulatory Visit: Payer: Self-pay | Admitting: Family Medicine

## 2016-12-23 ENCOUNTER — Encounter: Payer: Self-pay | Admitting: Internal Medicine

## 2017-01-07 DIAGNOSIS — C61 Malignant neoplasm of prostate: Secondary | ICD-10-CM | POA: Diagnosis not present

## 2017-01-07 DIAGNOSIS — R310 Gross hematuria: Secondary | ICD-10-CM | POA: Diagnosis not present

## 2017-01-13 ENCOUNTER — Encounter: Payer: Self-pay | Admitting: Family Medicine

## 2017-01-15 DIAGNOSIS — R310 Gross hematuria: Secondary | ICD-10-CM | POA: Diagnosis not present

## 2017-01-15 DIAGNOSIS — N281 Cyst of kidney, acquired: Secondary | ICD-10-CM | POA: Diagnosis not present

## 2017-02-11 ENCOUNTER — Ambulatory Visit: Payer: Medicare Other | Admitting: Family Medicine

## 2017-02-12 ENCOUNTER — Ambulatory Visit (INDEPENDENT_AMBULATORY_CARE_PROVIDER_SITE_OTHER): Payer: Medicare Other | Admitting: Family Medicine

## 2017-02-12 ENCOUNTER — Encounter: Payer: Self-pay | Admitting: Family Medicine

## 2017-02-12 VITALS — BP 104/62 | HR 57 | Temp 98.5°F | Ht 74.0 in | Wt 214.4 lb

## 2017-02-12 DIAGNOSIS — I639 Cerebral infarction, unspecified: Secondary | ICD-10-CM | POA: Diagnosis not present

## 2017-02-12 DIAGNOSIS — N183 Chronic kidney disease, stage 3 unspecified: Secondary | ICD-10-CM

## 2017-02-12 DIAGNOSIS — E041 Nontoxic single thyroid nodule: Secondary | ICD-10-CM

## 2017-02-12 DIAGNOSIS — Z23 Encounter for immunization: Secondary | ICD-10-CM | POA: Diagnosis not present

## 2017-02-12 DIAGNOSIS — R310 Gross hematuria: Secondary | ICD-10-CM | POA: Diagnosis not present

## 2017-02-12 DIAGNOSIS — R739 Hyperglycemia, unspecified: Secondary | ICD-10-CM | POA: Diagnosis not present

## 2017-02-12 DIAGNOSIS — I480 Paroxysmal atrial fibrillation: Secondary | ICD-10-CM

## 2017-02-12 DIAGNOSIS — E785 Hyperlipidemia, unspecified: Secondary | ICD-10-CM

## 2017-02-12 NOTE — Progress Notes (Signed)
Subjective:  Elijah [name] is a 76 y.o. year old very pleasant male patient who presents for/with See problem oriented charting ROS- No chest pain or shortness of breath. No headache or blurry vision. Feels off balance at times   Past Medical History-  Patient Active Problem List   Diagnosis Date Noted  . History of CVA (cerebrovascular accident) 05/14/2016    Priority: High  . History of melanoma 12/04/2015    Priority: High  . Atrial fibrillation ([place]) 04/06/2007    Priority: High  . Hyperglycemia 08/13/2016    Priority: Medium  . Thyroid nodule     Priority: Medium  . GERD (gastroesophageal reflux disease) 12/04/2015    Priority: Medium  . CKD (chronic kidney disease), stage III 12/07/2014    Priority: Medium  . History of prostate cancer 09/23/2012    Priority: Medium  . 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 Essential hypertension 04/07/2007    Priority: Medium  . Hyperlipidemia 04/06/2007    Priority: Medium  . Gross hematuria 02/12/2017    Priority: Low  . Osteoarthritis of left hip 08/09/2014    Priority: Low  . Actinic keratosis 06/05/2014    Priority: Low    Medications- reviewed and updated Current Outpatient Prescriptions  Medication Sig Dispense Refill  . acetaminophen (TYLENOL) 500 MG tablet Take 500 mg by mouth every 6 (six) hours as needed for mild pain.    . Cholecalciferol (VITAMIN D PO) Take 1 tablet by mouth daily. 1000units daily    . metoprolol succinate (TOPROL-XL) 50 MG 24 hr tablet take 1 tablet by mouth once daily 30 tablet 5  . ranitidine (ZANTAC) 150 MG tablet Take 1 tablet (150 mg total) by mouth 2 (two) times daily. 180 tablet 3  . simvastatin (ZOCOR) 20 MG tablet take 1 tablet by mouth once daily 90 tablet 1  . XARELTO 20 MG TABS tablet take 1 tablet by mouth once daily WITH SUPPER 90 tablet 2   No current facility-administered medications for this visit.     Objective: BP 104/62 (BP Location:  Left Arm, Patient Position: Sitting, Cuff Size: Large)   Pulse (!) 57   Temp 98.5 F (36.9 C) (Oral)   Ht 6\' 2"  (1.88 m)   Wt 214 lb 6.4 oz (97.3 kg)   SpO2 97%   BMI 27.53 kg/m  Gen: NAD, resting comfortably CV: irregularly irregular no murmurs rubs or gallops Lungs: CTAB no crackles, wheeze, rhonchi Abdomen: soft/nontender/nondistended/normal bowel sounds. No rebound or guarding.  Ext: no edema Skin: warm, dry Neuro: grossly normal, moves all extremities  Assessment/Plan:  HTN at goal   Gross hematuria S: hematuria workup 01/07/17 with cystoscopy and had CT scan after that- normal A/P: has been cleared for follow up until January/february  Hyperlipidemia S: very mildly controlled on simvastatin 20mg  with LDL 75 on last check. No myalgias.   A/P: he is working on weight loss. Goal LDL <70 due to CVA- hopeful able to achieve goal with weight loss  Hyperglycemia S: a1c 5.9. Gradual weight loss- 7 lbs over last 4 months Wt Readings from Last 3 Encounters:  02/12/17 214 lb 6.4 oz (97.3 kg)  12/15/16 218 lb (98.9 kg)  10/08/16 221 lb 3.2 oz (100.3 kg)  A/P: continue weight loss efforts  Thyroid nodule biopsy 11/11/16 was benign and has 6 month follow up with endocrinology  Atrial fibrillation ([place]) S: rate controlled on metoprolol 50mg  XL. On xarelto 20mg  daily for anticoagulation A/P:  continue current treatment plan. He asks about reducing any of his meds- only option I see would be to go to 25 mg metoprolol or reduce zantac if continues to lose weight  CKD (chronic kidney disease), stage III S: GFR around 50. Had this done at urology as well within last few months and around 51 GFR per his recollection. Avoids nsaids- tylenol only A/P: doing well, not on ace-I. Could consider microalbumin/creatinine ratio or at least a UA at follow up   6 month reasonable.  Orders Placed This Encounter  Procedures  . Pneumococcal conjugate vaccine 13-valent IM   Return precautions  advised.  [name], MD

## 2017-02-12 NOTE — Assessment & Plan Note (Signed)
S: a1c 5.9. Gradual weight loss- 7 lbs over last 4 months Wt Readings from Last 3 Encounters:  02/12/17 214 lb 6.4 oz (97.3 kg)  12/15/16 218 lb (98.9 kg)  10/08/16 221 lb 3.2 oz (100.3 kg)  A/P: continue weight loss efforts

## 2017-02-12 NOTE — Assessment & Plan Note (Signed)
biopsy 11/11/16 was benign and has 6 month follow up with endocrinology

## 2017-02-12 NOTE — Assessment & Plan Note (Signed)
S: very mildly controlled on simvastatin 20mg  with LDL 75 on last check. No myalgias.   A/P: he is working on weight loss. Goal LDL <70 due to CVA- hopeful able to achieve goal with weight loss

## 2017-02-12 NOTE — Assessment & Plan Note (Signed)
S: GFR around 50. Had this done at urology as well within last few months and around 51 GFR per his recollection. Avoids nsaids- tylenol only A/P: doing well, not on ace-I. Could consider microalbumin/creatinine ratio or at least a UA at follow up

## 2017-02-12 NOTE — Assessment & Plan Note (Signed)
S: rate controlled on metoprolol 50mg  XL. On xarelto 20mg  daily for anticoagulation A/P: continue current treatment plan. He asks about reducing any of his meds- only option I see would be to go to 25 mg metoprolol or reduce zantac if continues to lose weight

## 2017-02-12 NOTE — Assessment & Plan Note (Signed)
S: hematuria workup 01/07/17 with cystoscopy and had CT scan after that- normal A/P: has been cleared for follow up until [facility]

## 2017-02-12 NOTE — Patient Instructions (Signed)
No changes today  You are doing great!  Keep up the great job with healthy habits and weight loss  Prevnar 13 (final pneumonia shot today)

## 2017-02-17 ENCOUNTER — Ambulatory Visit (INDEPENDENT_AMBULATORY_CARE_PROVIDER_SITE_OTHER): Payer: Medicare Other | Admitting: Internal Medicine

## 2017-02-17 ENCOUNTER — Encounter: Payer: Self-pay | Admitting: Internal Medicine

## 2017-02-17 VITALS — BP 122/74 | HR 64 | Ht 74.0 in | Wt 215.6 lb

## 2017-02-17 DIAGNOSIS — I482 Chronic atrial fibrillation, unspecified: Secondary | ICD-10-CM

## 2017-02-17 DIAGNOSIS — Z7901 Long term (current) use of anticoagulants: Secondary | ICD-10-CM | POA: Diagnosis not present

## 2017-02-17 DIAGNOSIS — Z86010 Personal history of colonic polyps: Secondary | ICD-10-CM | POA: Diagnosis not present

## 2017-02-17 DIAGNOSIS — Z8673 Personal history of transient ischemic attack (TIA), and cerebral infarction without residual deficits: Secondary | ICD-10-CM

## 2017-02-17 DIAGNOSIS — I639 Cerebral infarction, unspecified: Secondary | ICD-10-CM

## 2017-02-17 NOTE — Progress Notes (Signed)
Elijah [name] 76 y.o. [birth year] [id number]  Assessment & Plan:   Encounter Diagnoses  Name Primary?  Marland Kitchen Hx of adenomatous colonic polyps Yes  . Chronic atrial fibrillation ([place])   . Chronic anticoagulation with Xarelto   . History of stroke     I will place a recall for one year, I will look at his chart and most likely contact him by phone to see if he wants to pursue a colonoscopy. He does not want to do that now on says he will consider in the future. We talked that he is not at great increased risk of colon cancer based upon his history, 2 diminutive adenomas according to the expert guidelines means follow-up colonoscopy anywhere from 5-10 years.  He developed signs or symptoms of gastrointestinal problems like rectal bleeding or change in bowel habits, I did review with him to contact me before we contact him.  I appreciate the opportunity to care for this patient. CC: [name], MD   Subjective:   Chief Complaint:History of colon polyps  HPI Patient is here for follow-up. He had a stroke last year related to A. fib and is on Xarelto. Been relatively recent. He was seen in December 2017 and this follow-up was made. He is not inclined to pursue a colonoscopy yet. He might do one later it might not. He is not having any active GI symptoms. He is not anemic. He recently had a CT scan as well as a cystoscopy due to some hematuria coordinated through Dr. Raynelle Bring, he showed me a copy of the CT report abdomen and pelvis and there were no significant pathologies noted anywhere including the GI tract. Allergies  Allergen Reactions  . Ciprofloxacin Nausea Only  . Flagyl [Metronidazole] Nausea Only   Current Meds  Medication Sig  . acetaminophen (TYLENOL) 500 MG tablet Take 500 mg by mouth every 6 (six) hours as needed for mild pain.  . Cholecalciferol (VITAMIN D PO) Take 1 tablet by mouth daily. 1000units daily  . metoprolol succinate (TOPROL-XL) 50 MG 24 hr tablet  take 1 tablet by mouth once daily  . ranitidine (ZANTAC) 150 MG tablet Take 1 tablet (150 mg total) by mouth 2 (two) times daily.  . simvastatin (ZOCOR) 20 MG tablet take 1 tablet by mouth once daily  . XARELTO 20 MG TABS tablet take 1 tablet by mouth once daily WITH SUPPER   Past Medical History:  Diagnosis Date  . Adenomatous colon polyp   . Arthritis 09-10-11   rt. hip, knee, hands  . Atrial fibrillation ([place]) 09-10-11   Chronic A. Fib.-controls with med and Aspirin  . Cancer ([place]) 09-10-11   dx. Prostate cancer-bx. done 12'12, surgery planned  . Diverticulosis   . Hemorrhoid   . Hyperlipidemia   . Hypertension   . 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 etiology  . Peripheral neuropathy   . Prostate cancer ([place])   . Stroke [facility])    Past Surgical History:  Procedure Laterality Date  . COLONOSCOPY  04/2003, 06/05/2011   diverticulosis, internal and external hemorrhoids 2004 and 2012, 2 small polyps 2012  . EYE SURGERY  09-10-11   Only stitches to close cut, and observation of hematoma from injury  . ROBOT ASSISTED LAPAROSCOPIC RADICAL PROSTATECTOMY  09/15/2011   Procedure: ROBOTIC ASSISTED LAPAROSCOPIC RADICAL PROSTATECTOMY LEVEL 2;  Surgeon: Dutch Gray, MD;  Location: [facility];  Service: Urology;  Laterality: N/A;        . TONSILLECTOMY  Review of Systems Neuropathy making it difficult to walk  Objective:   Physical Exam BP 122/74   Pulse 64   Ht 6\' 2"  (1.88 m)   Wt 215 lb 9.6 oz (97.8 kg)   BMI 27.68 kg/m  NAD   15 minutes time spent with patient > half in counseling coordination of care

## 2017-02-17 NOTE — Patient Instructions (Signed)
   I will plan to talk to you in about a year re: possibly repeating a colonoscopy.   Please call back if you develop any gastrointestinal problems (hope not!)  I appreciate the opportunity to care for you. Gatha Mayer, MD, [facility] Regal

## 2017-02-25 DIAGNOSIS — L821 Other seborrheic keratosis: Secondary | ICD-10-CM | POA: Diagnosis not present

## 2017-02-25 DIAGNOSIS — Z08 Encounter for follow-up examination after completed treatment for malignant neoplasm: Secondary | ICD-10-CM | POA: Diagnosis not present

## 2017-02-25 DIAGNOSIS — Z1283 Encounter for screening for malignant neoplasm of skin: Secondary | ICD-10-CM | POA: Diagnosis not present

## 2017-02-25 DIAGNOSIS — Z85820 Personal history of malignant melanoma of skin: Secondary | ICD-10-CM | POA: Diagnosis not present

## 2017-03-27 ENCOUNTER — Encounter: Payer: Self-pay | Admitting: Physician Assistant

## 2017-03-27 ENCOUNTER — Ambulatory Visit (INDEPENDENT_AMBULATORY_CARE_PROVIDER_SITE_OTHER): Payer: Medicare Other | Admitting: Physician Assistant

## 2017-03-27 VITALS — BP 120/70 | HR 60 | Ht 74.0 in | Wt 217.0 lb

## 2017-03-27 DIAGNOSIS — I10 Essential (primary) hypertension: Secondary | ICD-10-CM | POA: Diagnosis not present

## 2017-03-27 DIAGNOSIS — I482 Chronic atrial fibrillation: Secondary | ICD-10-CM | POA: Diagnosis not present

## 2017-03-27 DIAGNOSIS — I639 Cerebral infarction, unspecified: Secondary | ICD-10-CM

## 2017-03-27 DIAGNOSIS — I4821 Permanent atrial fibrillation: Secondary | ICD-10-CM

## 2017-03-27 NOTE — Patient Instructions (Signed)
Medication Instructions:  Your physician recommends that you continue on your current medications as directed. Please refer to the Current Medication list given to you today.   Labwork: None Ordered   Testing/Procedures: None Ordered   Follow-Up: Your physician wants you to follow-up in: 1 year with Dr. Rayann Heman. You will receive a reminder letter in the mail two months in advance. If you don't receive a letter, please call our office to schedule the follow-up appointment.   Any Other Special Instructions Will Be Listed Below (If Applicable).     If you need a refill on your cardiac medications before your next appointment, please call your pharmacy.

## 2017-03-27 NOTE — Progress Notes (Signed)
Cardiology Office Note Date:  03/27/2017  Patient ID:  Elijah [name], DOB [birth year], MRN [id number] PCP:  [name], MD  Cardiologist:  Remotely, Dr. Rayann Heman   Chief Complaint: 6 mo visit  History of Present Illness: Elijah [name] is a 76 y.o. male with history of permanent AFib, HTN, HLD, CRI stage III, peripheral neuropathy, prostate cancer s/p surgery with subsequent problems with fecal incontinence at times, and most recently hospitalized at [facility] with transient facial droop, slurred speech found with multiple R sided CVA's, seen by neurology and started on Xarelto for a/c given his AF discharge 05/05/16. 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  He has historically been seen in the AF clinic via his PMD in May of 2016, though despite conversation of concerns about possible TIA like symptoms, the patient was not convinced about starting a/c.  He was seen by myself (case reviewed with Dr. Lovena Le at the time of visit) in Oct 2017,  as a new patient to [facility] to re-establish EP care secondary to his AF.  He reports seeing Dr. Rayann Heman +/- 7-8 years ago, at that time, for possible AF ablation, but recalls felt that he was a poor candidate given he had already been in AF for years and was without symptoms.  He denied any kind of palpitations or exertional intolerances, is unaware of his AF, no CP, or SOB.  He reported his neurological symptoms completely resolved within an hour or so and he remains feeling well.  He denied any dizziness, near syncope or syncope, he has neuropathy chronically and occassional gait instability with infrequent falls, no injuries.  He was planned for PMD for f/u then.  He is feeling very well.  [facility] walking with his hips/knees being his only exertional limitations.  No CP, palpitations or SOB, no dizziness, near syncope or syncope.  3 months ago he has some small amounts of blood-tinged urine  saw urology with labs and work up that he said noted some "sore" on his bladder that had healed, and felt to be the culprit and has not been recurrent.  I suggested today we do labs given his xarelto but prefers not to having had labs done by he says by urology and at least knew for sure his kidney function was good, and has full labs planned with PMD in a month or so.  He also follows with neurology and endocrinology.  Past Medical History:  Diagnosis Date  . Adenomatous colon polyp   . Arthritis 09-10-11   rt. hip, knee, hands  . Atrial fibrillation ([place]) 09-10-11   Chronic A. Fib.-controls with med and Aspirin  . Cancer ([place]) 09-10-11   dx. Prostate cancer-bx. done 12'12, surgery planned  . Diverticulosis   . Hemorrhoid   . Hx of adenomatous colonic polyps 06/05/2011  . Hyperlipidemia   . Hypertension   . 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 etiology  . Peripheral neuropathy   . Prostate cancer ([place])   . Stroke [facility])     Past Surgical History:  Procedure Laterality Date  . COLONOSCOPY  04/2003, 06/05/2011   diverticulosis, internal and external hemorrhoids 2004 and 2012, 2 small polyps 2012  . EYE SURGERY  09-10-11   Only stitches to close cut, and observation of hematoma from injury  . ROBOT ASSISTED LAPAROSCOPIC RADICAL PROSTATECTOMY  09/15/2011   Procedure: ROBOTIC ASSISTED LAPAROSCOPIC RADICAL PROSTATECTOMY LEVEL 2;  Surgeon: Dutch Gray, MD;  Location: [facility];  Service: Urology;  Laterality: N/A;        . TONSILLECTOMY      Current Outpatient Prescriptions  Medication Sig Dispense Refill  . acetaminophen (TYLENOL) 500 MG tablet Take 500 mg by mouth 2 (two) times daily.     . Cholecalciferol (VITAMIN D PO) Take 1 tablet by mouth daily. 1000units daily    . metoprolol succinate (TOPROL-XL) 50 MG 24 hr tablet take 1 tablet by mouth once daily 30 tablet 5  . ranitidine (ZANTAC) 150 MG tablet Take 1 tablet (150 mg total) by mouth 2 (two) times daily. 180 tablet 3  . simvastatin  (ZOCOR) 20 MG tablet take 1 tablet by mouth once daily 90 tablet 1  . XARELTO 20 MG TABS tablet take 1 tablet by mouth once daily WITH SUPPER 90 tablet 2   No current facility-administered medications for this visit.     Allergies:   Ciprofloxacin and Flagyl [metronidazole]   Social History:  The patient  reports that he quit smoking about 48 years ago. He has never used smokeless tobacco. He reports that he does not drink alcohol or use drugs.   Family History:  The patient's family history includes Diabetes in his mother; Diverticulosis in his mother; Heart disease (age of onset: 14) in his father; Hypertension in his mother; Prostate cancer in his brother.  ROS:  Please see the history of present illness.  All other systems are reviewed and otherwise negative.   PHYSICAL EXAM:  VS:  BP 120/70   Pulse 60   Ht 6\' 2"  (1.88 m)   Wt 217 lb (98.4 kg)   BMI 27.86 kg/m  BMI: Body mass index is 27.86 kg/m. Well nourished, well developed, in no acute distress  HEENT: normocephalic, atraumatic  Neck: no JVD, carotid bruits or masses Cardiac:  IRRR, no significant murmurs, no rubs, or gallops Lungs:  CTA b/l, no wheezing, rhonchi or rales  Abd: soft, nontender MS: no deformity or atrophy Ext:  no edema  Skin: warm and dry, no rash Neuro:  No gross deficits appreciated Psych: euthymic mood, full affect   EKG:  Done 05/13/16 is reviewed by myself and Dr. Lovena Le, AFib, RBBB (new since older EKGs)  05/15/16: TTE Study Conclusions - Left ventricle: The cavity size was normal. Wall thickness was   increased in a pattern of mild LVH. Systolic function was normal.   The estimated ejection fraction was in the range of 55% to 60%.   Wall motion was normal; there were no regional wall motion   abnormalities. - Mitral valve: Calcified annulus. There was mild regurgitation. - Left atrium: The atrium was severely dilated. - Right ventricle: The cavity size was mildly dilated. - Right atrium:  The atrium was severely dilated. Impressions: - Normal LV systolic function; mild LVH; severe biatrial  (LA 24mm)   enlargement; mild RVE; mild MR; trace TR.  Recent Labs: 05/15/2016: Magnesium 2.0 08/13/2016: ALT 19; BUN 23; Creatinine, Ser 1.49; Hemoglobin 15.7; Platelets 211.0; Sodium 141; TSH 0.98 08/22/2016: Potassium 4.4  05/14/2016: Cholesterol 160; HDL 35; LDL Cholesterol 106; Total CHOL/HDL Ratio 4.6; Triglycerides 97; VLDL 19 08/13/2016: Direct LDL 75.0   CrCl cannot be calculated (Patient's most recent lab result is older than the maximum 21 days allowed.).   Wt Readings from Last 3 Encounters:  03/27/17 217 lb (98.4 kg)  02/17/17 215 lb 9.6 oz (97.8 kg)  02/12/17 214 lb 6.4 oz (97.3 kg)  Other studies reviewed: Additional studies/records reviewed today include: summarized above  ASSESSMENT AND PLAN:  1. Long hx of permanent AFib, rate controlled and asymptomatic     CHA2DS2Vasc is 4, now on Xarelto     last Calc Cr. Cl is 64   2.HTN    Looks good, no changes    Disposition: 1 year ROV, sooner if needed, he will get labs with his PMD  Current medicines are reviewed at length with the patient today.  The patient did not have any concerns regarding medicines.  Haywood Lasso, PA-C 03/27/2017 1:23 PM     [address] [place] [address] [phone] (office)  [phone] (fax)

## 2017-04-06 ENCOUNTER — Other Ambulatory Visit: Payer: Self-pay | Admitting: Family Medicine

## 2017-04-15 ENCOUNTER — Ambulatory Visit (INDEPENDENT_AMBULATORY_CARE_PROVIDER_SITE_OTHER): Payer: Medicare Other | Admitting: Nurse Practitioner

## 2017-04-15 ENCOUNTER — Encounter: Payer: Self-pay | Admitting: Nurse Practitioner

## 2017-04-15 VITALS — BP 147/84 | HR 58 | Wt 217.6 lb

## 2017-04-15 DIAGNOSIS — I639 Cerebral infarction, unspecified: Secondary | ICD-10-CM | POA: Diagnosis not present

## 2017-04-15 DIAGNOSIS — I10 Essential (primary) hypertension: Secondary | ICD-10-CM

## 2017-04-15 DIAGNOSIS — E785 Hyperlipidemia, unspecified: Secondary | ICD-10-CM

## 2017-04-15 DIAGNOSIS — Z8673 Personal history of transient ischemic attack (TIA), and cerebral infarction without residual deficits: Secondary | ICD-10-CM

## 2017-04-15 DIAGNOSIS — I4821 Permanent atrial fibrillation: Secondary | ICD-10-CM

## 2017-04-15 DIAGNOSIS — I482 Chronic atrial fibrillation: Secondary | ICD-10-CM
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
## 2017-04-15 NOTE — Patient Instructions (Addendum)
Stressed the importance of continued management of risk factors to prevent further stroke Continue Xarelto for secondary stroke prevention  and atrial fibrillation Maintain strict control of hypertension with blood pressure goal below 130/90, today's reading 147/84continue antihypertensive medications Cholesterol with LDL cholesterol less than 70, followed by primary care,   continue statin drugs zocor Exercise by walking as you are currently doing, ,  eat healthy diet with whole grains,  fresh fruits and vegetables Discharge from stroke clinic Stroke Prevention Some medical conditions and behaviors are associated with an increased chance of having a stroke. You may prevent a stroke by making healthy choices and managing medical conditions. How can I reduce my risk of having a stroke?  Stay physically active. Get at least 30 minutes of activity on most or all days.  Do not smoke. It may also be helpful to avoid exposure to secondhand smoke.  Limit alcohol use. Moderate alcohol use is considered to be: ? No more than 2 drinks per day for men. ? No more than 1 drink per day for nonpregnant women.  Eat healthy foods. This involves: ? Eating 5 or more servings of fruits and vegetables a day. ? Making dietary changes that address high blood pressure (hypertension), high cholesterol, diabetes, or obesity.  Manage your cholesterol levels. ? Making food choices that are high in fiber and low in saturated fat, trans fat, and cholesterol may control cholesterol levels. ? Take any prescribed medicines to control cholesterol as directed by your health care provider.  Manage your diabetes. ? Controlling your carbohydrate and sugar intake is recommended to manage diabetes. ? Take any prescribed medicines to control diabetes as directed by your health care provider.  Control your hypertension. ? Making food choices that are low in salt (sodium), saturated fat, trans fat, and cholesterol is  recommended to manage hypertension. ? Ask your health care provider if you need treatment to lower your blood pressure. Take any prescribed medicines to control hypertension as directed by your health care provider. ? If you are 14-80 years of age, have your blood pressure checked every 3-5 years. If you are 39 years of age or older, have your blood pressure checked every year.  Maintain a healthy weight. ? Reducing calorie intake and making food choices that are low in sodium, saturated fat, trans fat, and cholesterol are recommended to manage weight.  Stop drug abuse.  Avoid taking birth control pills. ? Talk to your health care provider about the risks of taking birth control pills if you are over 68 years old, smoke, get migraines, or have ever had a blood clot.  Get evaluated for sleep disorders (sleep apnea). ? Talk to your health care provider about getting a sleep evaluation if you snore a lot or have excessive sleepiness.  Take medicines only as directed by your health care provider. ? For some people, aspirin or blood thinners (anticoagulants) are helpful in reducing the risk of forming abnormal blood clots that can lead to stroke. If you have the irregular heart rhythm of atrial fibrillation, you should be on a blood thinner unless there is a good reason you cannot take them. ? Understand all your medicine instructions.  Make sure that other conditions (such as anemia or atherosclerosis) are addressed. Get help right away if:  You have sudden weakness or numbness of the face, arm, or leg, especially on one side of the body.  Your face or eyelid droops to one side.  You have sudden confusion.  You  have trouble speaking (aphasia) or understanding.  You have sudden trouble seeing in one or both eyes.  You have sudden trouble walking.  You have dizziness.  You have a loss of balance or coordination.  You have a sudden, severe headache with no known cause.  You have new  chest pain or an irregular heartbeat. Any of these symptoms may represent a serious problem that is an emergency. Do not wait to see if the symptoms will go away. Get medical help at once. Call your local emergency services (911 in [place] Do not drive yourself to the hospital. This information is not intended to replace advice given to you by your health care provider. Make sure you discuss any questions you have with your health care provider. Document Released: 08/28/2004 Document Revised: 12/27/2015 Document Reviewed: 01/21/2013 Elsevier Interactive Patient Education  2017 [employer].

## 2017-05-19 NOTE — Progress Notes (Signed)
I reviewed note and agree with plan.   VIKRAM R. PENUMALLI, MD  Certified in Neurology, Neurophysiology and Neuroimaging  [facility] [address], [address] [place], [place] [address] [phone]   

## 2017-05-27 ENCOUNTER — Encounter: Payer: Self-pay | Admitting: Endocrinology

## 2017-05-27 ENCOUNTER — Ambulatory Visit (INDEPENDENT_AMBULATORY_CARE_PROVIDER_SITE_OTHER): Payer: Medicare Other | Admitting: Endocrinology

## 2017-05-27 VITALS — BP 136/92 | HR 48 | Ht 74.0 in | Wt 218.2 lb

## 2017-05-27 DIAGNOSIS — I639 Cerebral infarction, unspecified: Secondary | ICD-10-CM | POA: Diagnosis not present

## 2017-05-27 DIAGNOSIS — E042 Nontoxic multinodular goiter: Secondary | ICD-10-CM

## 2017-05-27 DIAGNOSIS — Z85820 Personal history of malignant melanoma of skin: Secondary | ICD-10-CM | POA: Diagnosis not present

## 2017-05-27 DIAGNOSIS — Z08 Encounter for follow-up examination after completed treatment for malignant neoplasm: Secondary | ICD-10-CM | POA: Diagnosis not present

## 2017-05-27 DIAGNOSIS — Z1283 Encounter for screening for malignant neoplasm of skin: Secondary | ICD-10-CM | POA: Diagnosis not present

## 2017-05-27 NOTE — Progress Notes (Signed)
Patient ID: Elijah [name], male   DOB: 12-Jan-1941, 76 y.o.   MRN: [id number]            Reason for Appointment: Evaluation of thyroid nodule    History of Present Illness:   The patient's thyroid nodule was first discovered when he was having CT angiography for his cerebrovascular disease  The thyroid ultrasound on 05/14/16 showed the following Has a dominant nodule, location Left; Superior Size: Measures 3.7 x 2.0 x 4.3 cm, Composition: solid/almost completely solid (2) Echogenicity: isoechoic (1) Also has 6-10 nodules over 1 cm bilaterally  He was finally able to get a biopsy done of his dominant nodule in 11/2016 which was benign as follows  Patient again does not feel any swelling on his neck,difficulty swallowing, pressure or choking his neck Thyroid levels have been normal previously  Thyroid biopsy results:  LEFT UPPER POLE, FINE NEEDLE ASPIRATION (SPECIMEN 1 OF 1, COLLECTED 11/11/16): CONSISTENT WITH BENIGN FOLLICULAR NODULE (BETHESDA CATEGORY II).   Lab Results  Component Value Date   FREET4 1.12 05/22/2016   TSH 0.98 08/13/2016   TSH 0.74 05/22/2016   TSH 1.21 12/13/2014    Allergies as of 05/27/2017      Reactions   Ciprofloxacin Nausea Only   Flagyl [metronidazole] Nausea Only      Medication List       Accurate as of 05/27/17  1:16 PM. Always use your most recent med list.          acetaminophen 500 MG tablet Commonly known as:  TYLENOL Take 500 mg by mouth 2 (two) times daily.   metoprolol succinate 50 MG 24 hr tablet Commonly known as:  TOPROL-XL TAKE 1 TABLET BY MOUTH DAILY   ranitidine 150 MG tablet Commonly known as:  ZANTAC Take 1 tablet (150 mg total) by mouth 2 (two) times daily.   simvastatin 20 MG tablet Commonly known as:  ZOCOR take 1 tablet by mouth once daily   VITAMIN D PO Take 1 tablet by mouth daily. 1000units daily   XARELTO 20 MG Tabs tablet Generic drug:  rivaroxaban take 1 tablet by mouth once daily WITH SUPPER        Allergies:  Allergies  Allergen Reactions  . Ciprofloxacin Nausea Only  . Flagyl [Metronidazole] Nausea Only    Past Medical History:  Diagnosis Date  . Adenomatous colon polyp   . Arthritis 09-10-11   rt. hip, knee, hands  . Atrial fibrillation ([place]) 09-10-11   Chronic A. Fib.-controls with med and Aspirin  . Cancer ([place]) 09-10-11   dx. Prostate cancer-bx. done 12'12, surgery planned  . Diverticulosis   . Hemorrhoid   . Hx of adenomatous colonic polyps 06/05/2011  . Hyperlipidemia   . Hypertension   . 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 etiology  . Peripheral neuropathy   . Prostate cancer ([place])   . Stroke [facility])     There is no history of radiation to the neck in childhood  Past Surgical History:  Procedure Laterality Date  . COLONOSCOPY  04/2003, 06/05/2011   diverticulosis, internal and external hemorrhoids 2004 and 2012, 2 small polyps 2012  . EYE SURGERY  09-10-11   Only stitches to close cut, and observation of hematoma from injury  . ROBOT ASSISTED LAPAROSCOPIC RADICAL PROSTATECTOMY  09/15/2011   Procedure: ROBOTIC ASSISTED LAPAROSCOPIC RADICAL PROSTATECTOMY LEVEL 2;  Surgeon: Dutch Gray, MD;  Location: [facility];  Service: Urology;  Laterality: N/A;        .  TONSILLECTOMY      Family History  Problem Relation Age of Onset  . Diabetes Mother   . Hypertension Mother   . Diverticulosis Mother   . Heart disease Father 16       myoc infarction  . Prostate cancer Brother   . Colon cancer Neg Hx     Social History:  reports that he quit smoking about 48 years ago. He has never used smokeless tobacco. He reports that he does not drink alcohol or use drugs.   Review of Systems:  Wt Readings from Last 3 Encounters:  05/27/17 218 lb 3.2 oz (99 kg)  04/15/17 217 lb 9.6 oz (98.7 kg)  03/27/17 217 lb (98.4 kg)    Examination:   BP (!) 136/92   Pulse (!) 48   Ht 6\' 2"  (1.88 m)   Wt 218 lb 3.2 oz (99 kg)   SpO2 91%   BMI 28.02 kg/m    Repeat heart rate  56, occasionally slower        THYROID:  Right lobe of thyroid is Palpable mostly on swallowing, it is at least about 3 times normal, smooth and slightly firm.  Left lobe is palpable only on swallowing, mostly medially and is more difficult to palpate, difficult to estimate size  Biceps reflexes normal   Assessment/Plan:  Multinodular goiter with normal thyroid functions:  He does have a benign dominant  nodule on the left side which is isoechoic  Currently not having any local pressure symptoms or dysphagia Also has multiple smaller nodules Clinically his thyroid exam is about the same and no signs of tracheal compression  Will have him come back in 6 months and recheck ultrasound at that time    Also repeat thyroid function next year   [facility] 05/27/2017  Note: This office note was prepared with Dragon voice recognition system technology. Any transcriptional errors that result from this process are unintentional.

## 2017-06-17 DIAGNOSIS — H5213 Myopia, bilateral: Secondary | ICD-10-CM | POA: Diagnosis not present

## 2017-06-17 DIAGNOSIS — H2513 Age-related nuclear cataract, bilateral: Secondary | ICD-10-CM | POA: Diagnosis not present

## 2017-06-17 DIAGNOSIS — H52223 Regular astigmatism, bilateral: Secondary | ICD-10-CM | POA: Diagnosis not present

## 2017-06-21 ENCOUNTER — Other Ambulatory Visit: Payer: Self-pay | Admitting: Family Medicine

## 2017-08-18 DIAGNOSIS — C61 Malignant neoplasm of prostate: Secondary | ICD-10-CM | POA: Diagnosis not present

## 2017-08-18 DIAGNOSIS — N393 Stress incontinence (female) (male): Secondary | ICD-10-CM | POA: Diagnosis not present

## 2017-08-18 DIAGNOSIS — R310 Gross hematuria: Secondary | ICD-10-CM | POA: Diagnosis not present

## 2017-08-20 ENCOUNTER — Encounter: Payer: Self-pay | Admitting: Family Medicine

## 2017-08-26 ENCOUNTER — Ambulatory Visit (INDEPENDENT_AMBULATORY_CARE_PROVIDER_SITE_OTHER): Payer: Medicare Other | Admitting: Family Medicine

## 2017-08-26 ENCOUNTER — Encounter: Payer: Self-pay | Admitting: Family Medicine

## 2017-08-26 VITALS — BP 114/68 | HR 56 | Temp 98.6°F | Ht 74.0 in | Wt 220.0 lb

## 2017-08-26 DIAGNOSIS — E785 Hyperlipidemia, unspecified: Secondary | ICD-10-CM | POA: Diagnosis not present

## 2017-08-26 DIAGNOSIS — Z08 Encounter for follow-up examination after completed treatment for malignant neoplasm: Secondary | ICD-10-CM | POA: Diagnosis not present

## 2017-08-26 DIAGNOSIS — I10 Essential (primary) hypertension: Secondary | ICD-10-CM | POA: Diagnosis not present

## 2017-08-26 DIAGNOSIS — Z23 Encounter for immunization: Secondary | ICD-10-CM | POA: Diagnosis not present

## 2017-08-26 DIAGNOSIS — N183 Chronic kidney disease, stage 3 unspecified: Secondary | ICD-10-CM

## 2017-08-26 DIAGNOSIS — R739 Hyperglycemia, unspecified: Secondary | ICD-10-CM | POA: Diagnosis not present

## 2017-08-26 DIAGNOSIS — I482 Chronic atrial fibrillation: Secondary | ICD-10-CM

## 2017-08-26 DIAGNOSIS — Z1283 Encounter for screening for malignant neoplasm of skin: Secondary | ICD-10-CM | POA: Diagnosis not present

## 2017-08-26 DIAGNOSIS — Z85820 Personal history of malignant melanoma of skin: Secondary | ICD-10-CM | POA: Diagnosis not present

## 2017-08-26 DIAGNOSIS — I4821 Permanent atrial fibrillation: Secondary | ICD-10-CM

## 2017-08-26 LAB — HEMOGLOBIN A1C: HEMOGLOBIN A1C: 6 % (ref 4.6–6.5)

## 2017-08-26 LAB — COMPREHENSIVE METABOLIC PANEL
ALBUMIN: 4 g/dL (ref 3.5–5.2)
ALT: 21 U/L (ref 0–53)
AST: 19 U/L (ref 0–37)
Alkaline Phosphatase: 75 U/L (ref 39–117)
BUN: 25 mg/dL — AB (ref 6–23)
CHLORIDE: 110 meq/L (ref 96–112)
CO2: 21 mEq/L (ref 19–32)
CREATININE: 1.27 mg/dL (ref 0.40–1.50)
Calcium: 9.6 mg/dL (ref 8.4–10.5)
GFR: 58.58 mL/min — ABNORMAL LOW (ref >60.00)
GLUCOSE: 96 mg/dL (ref 70–99)
Potassium: 5.1 mEq/L (ref 3.5–5.1)
SODIUM: 143 meq/L (ref 135–145)
TOTAL PROTEIN: 6.9 g/dL (ref 6.0–8.3)
Total Bilirubin: 0.6 mg/dL (ref 0.2–1.2)

## 2017-08-26 LAB — CBC
HEMATOCRIT: 46.6 % (ref 39.0–52.0)
Hemoglobin: 15.8 g/dL (ref 13.0–17.0)
MCHC: 33.9 g/dL (ref 30.0–36.0)
MCV: 94.4 fl (ref 78.0–100.0)
Platelets: 205 10*3/uL (ref 150.0–400.0)
RBC: 4.94 Mil/uL (ref 4.22–5.81)
RDW: 13.6 % (ref 11.5–15.5)
WBC: 6.8 10*3/uL (ref 4.0–10.5)

## 2017-08-26 LAB — POC URINALSYSI DIPSTICK (AUTOMATED)
BILIRUBIN UA: NEGATIVE
GLUCOSE UA: NEGATIVE
Ketones, UA: NEGATIVE
Leukocytes, UA: NEGATIVE
NITRITE UA: NEGATIVE
Protein, UA: NEGATIVE
RBC UA: NEGATIVE
Spec Grav, UA: >=1.03 — AB (ref 1.010–1.025)
UROBILINOGEN UA: 0.2 U/dL
pH, UA: 5 (ref 5.0–8.0)

## 2017-08-26 LAB — LIPID PANEL
CHOL/HDL RATIO: 4
Cholesterol: 124 mg/dL (ref 0–200)
HDL: 34.5 mg/dL — AB (ref >39.00)
LDL CALC: 73 mg/dL (ref 0–99)
NONHDL: 89.83
Triglycerides: 82 mg/dL (ref 0.0–149.0)
VLDL: 16.4 mg/dL (ref 0.0–40.0)

## 2017-08-26 NOTE — Patient Instructions (Addendum)
Please stop by lab before you go  Flu shot today  No changes in medicine  I would also like for you to sign up for an annual wellness visit with our nurse, Cassie, who specializes in the annual wellness visit. This is a free benefit under medicare that may help [place] find additional ways to help you. Some highlights are reviewing medications, lifestyle, and doing a dementia screen.

## 2017-08-26 NOTE — Assessment & Plan Note (Signed)
S: mild poorly controlled on last check with LDL at 75 (goal 70 with CVA hx). Goal was weight loss- [employer] daily- we discussed improving diet. He is down about 4 lbs over last year Lab Results  Component Value Date   CHOL 160 05/14/2016   HDL 35 (L) 05/14/2016   LDLCALC 106 (H) 05/14/2016   LDLDIRECT 75.0 08/13/2016   TRIG 97 05/14/2016   CHOLHDL 4.6 05/14/2016   A/P:  Update lipid panel today

## 2017-08-26 NOTE — Addendum Note (Signed)
Addended by: Lyndle Herrlich on: 08/26/2017 09:12 AM   Modules accepted: Orders

## 2017-08-26 NOTE — Assessment & Plan Note (Signed)
S: Patient with GFR around 50. Knows to avoid nsaids.  A/P: modification of risk factors for progression- hypertension and hyperlipidemia- have been reasonably controlled

## 2017-08-26 NOTE — Assessment & Plan Note (Signed)
S: rate controlled on metoprolol 50mg  XL. Anticoagulated with xarelto 20mg .  A/P: continue current medications

## 2017-08-26 NOTE — Addendum Note (Signed)
Addended by: Frutoso Chase A on: 08/26/2017 08:59 AM   Modules accepted: Orders

## 2017-08-26 NOTE — Assessment & Plan Note (Signed)
S: controlled on metoprolol 50mg  XR last visit here- mild poor control at neurology and cardiology BP Readings from Last 3 Encounters:  08/26/17 114/68  05/27/17 (!) 136/92  04/15/17 (!) 147/84  A/P: We discussed blood pressure goal of <140/90. Continue current meds

## 2017-08-26 NOTE — Progress Notes (Signed)
Subjective:  Elijah [name] is a 77 y.o. year old very pleasant male patient who presents for/with See problem oriented charting ROS- No chest pain or shortness of breath. No headache. Does have blurry vision from cataracts. No palpitations.    Past Medical History-  Patient Active Problem List   Diagnosis Date Noted  . History of CVA (cerebrovascular accident) 05/14/2016    Priority: High  . History of melanoma 12/04/2015    Priority: High  . Atrial fibrillation ([place]) 04/06/2007    Priority: High  . Hyperglycemia 08/13/2016    Priority: Medium  . Thyroid nodule     Priority: Medium  . GERD (gastroesophageal reflux disease) 12/04/2015    Priority: Medium  . CKD (chronic kidney disease), stage III ([place]) 12/07/2014    Priority: Medium  . History of prostate cancer 09/23/2012    Priority: Medium  . 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 Essential hypertension 04/07/2007    Priority: Medium  . Hyperlipidemia 04/06/2007    Priority: Medium  . Gross hematuria 02/12/2017    Priority: Low  . Osteoarthritis of left hip 08/09/2014    Priority: Low  . Actinic keratosis 06/05/2014    Priority: Low  . Hx of adenomatous colonic polyps 06/05/2011    Medications- reviewed and updated Current Outpatient Medications  Medication Sig Dispense Refill  . acetaminophen (TYLENOL) 500 MG tablet Take 500 mg by mouth 2 (two) times daily.     . Cholecalciferol (VITAMIN D PO) Take 1 tablet by mouth daily. 1000units daily    . metoprolol succinate (TOPROL-XL) 50 MG 24 hr tablet TAKE 1 TABLET BY MOUTH DAILY 30 tablet 5  . ranitidine (ZANTAC) 150 MG tablet Take 1 tablet (150 mg total) by mouth 2 (two) times daily. 180 tablet 3  . simvastatin (ZOCOR) 20 MG tablet TAKE 1 TABLET BY MOUTH ONCE DAILY 90 tablet 0  . XARELTO 20 MG TABS tablet take 1 tablet by mouth once daily WITH SUPPER 90 tablet 2   Objective: BP 114/68 (BP Location: Right Arm, Patient Position:  Sitting, Cuff Size: Large)   Pulse (!) 56   Temp 98.6 F (37 C) (Oral)   Ht 6\' 2"  (1.88 m)   Wt 220 lb (99.8 kg)   SpO2 96%   BMI 28.25 kg/m  Gen: NAD, resting comfortably CV: RRR no murmurs rubs or gallops Lungs: CTAB no crackles, wheeze, rhonchi Abdomen: soft/nontender/nondistended/normal bowel sounds. overweight Ext: no edema Skin: warm, dry  Assessment/Plan:  Other notes 1. Gross hematuria- had urology evaluation 01/07/17 with cystoscopy and CT scan that were nromal. Plan has been follow up early 2019- he saw them already this year. He has not had recurrence of hematuria.  2. Endocrine follow up for thyroid nodule. Biopsy benign 11/11/16. Will be following with Dr. Dwyane Dee 3. Sees Dr. Tommy Rainwater next week- suspects will need cataract surgery 4. Some arthritic pains 5. From Dr. Celesta Aver note 02/17/17 "I will place a recall for one year, I will look at his chart and most likely contact him by phone to see if he wants to pursue a colonoscopy. He does not want to do that now on says he will consider in the future. We talked that he is not at great increased risk of colon cancer based upon his history, 2 diminutive adenomas according to the expert guidelines means follow-up colonoscopy anywhere from 5-10 years."  Atrial fibrillation ([place]) S: rate controlled on metoprolol 50mg  XL. Anticoagulated with xarelto 20mg .  A/P: continue current medications  Essential hypertension S: controlled on metoprolol 50mg  XR last visit here- mild poor control at neurology and cardiology BP Readings from Last 3 Encounters:  08/26/17 114/68  05/27/17 (!) 136/92  04/15/17 (!) 147/84  A/P: We discussed blood pressure goal of <140/90. Continue current meds  Hyperlipidemia S: mild poorly controlled on last check with LDL at 75 (goal 70 with CVA hx). Goal was weight loss- [employer] daily- we discussed improving diet. He is down about 4 lbs over last year Lab Results  Component Value Date   CHOL 160 05/14/2016   HDL  35 (L) 05/14/2016   LDLCALC 106 (H) 05/14/2016   LDLDIRECT 75.0 08/13/2016   TRIG 97 05/14/2016   CHOLHDL 4.6 05/14/2016   A/P:  Update lipid panel today  Hyperglycemia S:  patient with a1c of 5.9 previously. Last visit he was down 7 lbs over 4 months, today he is up a few pounds but overall in last year down 4 lbs A/P: update a1c today  CKD (chronic kidney disease), stage III S: Patient with GFR around 50. Knows to avoid nsaids.  A/P: modification of risk factors for progression- hypertension and hyperlipidemia- have been reasonably controlled  Future Appointments  Date Time Provider [place]  11/25/2017  1:00 PM Elayne Snare, MD [facility] None  03/29/2018  9:45 AM Allred, Jeneen Rinks, MD [facility]   Return in about 6 months (around 02/23/2018) for follow up- or sooner if needed.  Lab/Order associations: CKD (chronic kidney disease), stage III ([place]) - Plan: Comprehensive metabolic panel, POCT Urinalysis Dipstick (Automated)  Essential hypertension - Plan: CBC, Comprehensive metabolic panel, POCT Urinalysis Dipstick (Automated)  Hyperglycemia - Plan: Hemoglobin A1c  Hyperlipidemia, unspecified hyperlipidemia type - Plan: Lipid panel, POCT Urinalysis Dipstick (Automated)  Return precautions advised.  [name], MD

## 2017-08-26 NOTE — Assessment & Plan Note (Signed)
S:  patient with a1c of 5.9 previously. Last visit he was down 7 lbs over 4 months, today he is up a few pounds but overall in last year down 4 lbs A/P: update a1c today

## 2017-08-27 ENCOUNTER — Encounter: Payer: Self-pay | Admitting: Family Medicine

## 2017-09-01 ENCOUNTER — Other Ambulatory Visit: Payer: Self-pay | Admitting: Family Medicine

## 2017-09-01 DIAGNOSIS — H2513 Age-related nuclear cataract, bilateral: Secondary | ICD-10-CM | POA: Diagnosis not present

## 2017-09-01 DIAGNOSIS — H02839 Dermatochalasis of unspecified eye, unspecified eyelid: Secondary | ICD-10-CM | POA: Diagnosis not present

## 2017-09-01 DIAGNOSIS — H2511 Age-related nuclear cataract, right eye: Secondary | ICD-10-CM | POA: Diagnosis not present

## 2017-09-01 DIAGNOSIS — H25043 Posterior subcapsular polar age-related cataract, bilateral: Secondary | ICD-10-CM | POA: Diagnosis not present

## 2017-09-01 DIAGNOSIS — H25013 Cortical age-related cataract, bilateral: Secondary | ICD-10-CM | POA: Diagnosis not present

## 2017-09-21 ENCOUNTER — Other Ambulatory Visit: Payer: Self-pay | Admitting: Family Medicine

## 2017-10-05 ENCOUNTER — Telehealth: Payer: Self-pay | Admitting: Nurse Practitioner

## 2017-10-05 ENCOUNTER — Other Ambulatory Visit: Payer: Self-pay | Admitting: Family Medicine

## 2017-10-05 NOTE — Telephone Encounter (Signed)
   [facility] Pre-operative Risk Assessment    Request for surgical clearance:  1. What type of surgery is being performed? Right and Left Eye Cataract Removal   2. When is this surgery scheduled? 10/26/17   3. What type of clearance is required (medical clearance vs. Pharmacy clearance to hold med vs. Both)? Medical  4. Are there any medications that need to be held prior to surgery and how long? None   5. Practice name and name of physician performing surgery? [facility]   6. What is your office phone and fax number?  P: [phone]   F: [phone]  7. Anesthesia type (None, local, MAC, general) ? Topical    Elijah [name] 10/05/2017, 12:24 PM  _________________________________________________________________   (provider comments below)

## 2017-10-05 NOTE — Telephone Encounter (Signed)
   Primary Cardiologist: Thompson Grayer, MD  Chart reviewed as part of pre-operative protocol coverage. Given past medical history and time since last visit, based on ACC/AHA guidelines, Elijah [name] would be at acceptable risk for the planned procedure without further cardiovascular testing. He does not need to hold Xarelto for cataract removal.   I will route this recommendation to the requesting party via [place] fax function and remove from pre-op pool.  Please call with questions.  [place], [place] 10/05/2017, 3:29 PM

## 2017-10-26 DIAGNOSIS — H2511 Age-related nuclear cataract, right eye: Secondary | ICD-10-CM | POA: Diagnosis not present

## 2017-10-27 DIAGNOSIS — H2512 Age-related nuclear cataract, left eye: Secondary | ICD-10-CM | POA: Diagnosis not present

## 2017-11-03 ENCOUNTER — Encounter: Payer: Self-pay | Admitting: Family Medicine

## 2017-11-09 DIAGNOSIS — H52222 Regular astigmatism, left eye: Secondary | ICD-10-CM | POA: Diagnosis not present

## 2017-11-09 DIAGNOSIS — Z9842 Cataract extraction status, left eye: Secondary | ICD-10-CM | POA: Diagnosis not present

## 2017-11-09 DIAGNOSIS — Z9841 Cataract extraction status, right eye: Secondary | ICD-10-CM | POA: Diagnosis not present

## 2017-11-09 DIAGNOSIS — H2512 Age-related nuclear cataract, left eye: Secondary | ICD-10-CM | POA: Diagnosis not present

## 2017-11-09 DIAGNOSIS — Z961 Presence of intraocular lens: Secondary | ICD-10-CM | POA: Diagnosis not present

## 2017-11-18 DIAGNOSIS — Z08 Encounter for follow-up examination after completed treatment for malignant neoplasm: Secondary | ICD-10-CM | POA: Diagnosis not present

## 2017-11-18 DIAGNOSIS — Z1283 Encounter for screening for malignant neoplasm of skin: Secondary | ICD-10-CM | POA: Diagnosis not present

## 2017-11-18 DIAGNOSIS — Z85820 Personal history of malignant melanoma of skin: Secondary | ICD-10-CM | POA: Diagnosis not present

## 2017-11-25 ENCOUNTER — Encounter: Payer: Self-pay | Admitting: Endocrinology

## 2017-11-25 ENCOUNTER — Ambulatory Visit (INDEPENDENT_AMBULATORY_CARE_PROVIDER_SITE_OTHER): Payer: Medicare Other | Admitting: Endocrinology

## 2017-11-25 ENCOUNTER — Other Ambulatory Visit (INDEPENDENT_AMBULATORY_CARE_PROVIDER_SITE_OTHER): Payer: Medicare Other

## 2017-11-25 VITALS — BP 128/72 | HR 70 | Ht 74.0 in | Wt 215.2 lb

## 2017-11-25 DIAGNOSIS — E042 Nontoxic multinodular goiter: Secondary | ICD-10-CM | POA: Diagnosis not present

## 2017-11-25 LAB — T4, FREE: FREE T4: 0.97 ng/dL (ref 0.60–1.60)

## 2017-11-25 LAB — TSH: TSH: 0.72 u[IU]/mL (ref 0.35–4.50)

## 2017-11-25 NOTE — Progress Notes (Signed)
Patient ID: Elijah [name], male   DOB: November 07, 1940, 77 y.o.   MRN: [id number]            Reason for Appointment: thyroid nodule    History of Present Illness:   The patient's thyroid nodule was first discovered when he was having CT angiography for his cerebrovascular disease  The thyroid ultrasound on 05/14/16 showed the following Has a dominant nodule, location Left; Superior Size: Measures 3.7 x 2.0 x 4.3 cm, Composition: solid/almost completely solid (2) Echogenicity: isoechoic (1) Also has 6-10 nodules over 1 cm bilaterally  He was able to get a biopsy done of his dominant nodule in 11/2016 which was benign as follows Thyroid biopsy results:  LEFT UPPER POLE, FINE NEEDLE ASPIRATION (SPECIMEN 1 OF 1, COLLECTED 11/11/16): CONSISTENT WITH BENIGN FOLLICULAR NODULE (BETHESDA CATEGORY II).   He is back for follow-up since his last visit in 05/2017 Recently does not feel any pressure in his neck area but does feel a slight lump sensation when he swallows No difficulty swallowing food or drink No choking sensation  Thyroid levels have been normal previously   Lab Results  Component Value Date   FREET4 1.12 05/22/2016   TSH 0.98 08/13/2016   TSH 0.74 05/22/2016   TSH 1.21 12/13/2014    Allergies as of 11/25/2017      Reactions   Ciprofloxacin Nausea Only   Flagyl [metronidazole] Nausea Only      Medication List        Accurate as of 11/25/17  1:11 PM. Always use your most recent med list.          acetaminophen 500 MG tablet Commonly known as:  TYLENOL Take 500 mg by mouth 2 (two) times daily.   metoprolol succinate 50 MG 24 hr tablet Commonly known as:  TOPROL-XL TAKE 1 TABLET BY MOUTH DAILY   ranitidine 150 MG tablet Commonly known as:  ZANTAC Take 1 tablet (150 mg total) by mouth 2 (two) times daily.   simvastatin 20 MG tablet Commonly known as:  ZOCOR TAKE 1 TABLET BY MOUTH ONCE DAILY   VITAMIN D PO Take 1 tablet by mouth daily. 1000units daily     XARELTO 20 MG Tabs tablet Generic drug:  rivaroxaban TAKE 1 TABLET BY MOUTH ONCE DAILY       Allergies:  Allergies  Allergen Reactions  . Ciprofloxacin Nausea Only  . Flagyl [Metronidazole] Nausea Only    Past Medical History:  Diagnosis Date  . Adenomatous colon polyp   . Arthritis 09-10-11   rt. hip, knee, hands  . Atrial fibrillation ([place]) 09-10-11   Chronic A. Fib.-controls with med and Aspirin  . Cancer ([place]) 09-10-11   dx. Prostate cancer-bx. done 12'12, surgery planned  . Diverticulosis   . Hemorrhoid   . Hx of adenomatous colonic polyps 06/05/2011  . Hyperlipidemia   . Hypertension   . 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 etiology  . Peripheral neuropathy   . Prostate cancer ([place])   . Stroke [facility])     There is no history of radiation to the neck in childhood  Past Surgical History:  Procedure Laterality Date  . COLONOSCOPY  04/2003, 06/05/2011   diverticulosis, internal and external hemorrhoids 2004 and 2012, 2 small polyps 2012  . EYE SURGERY  09-10-11   Only stitches to close cut, and observation of hematoma from injury  . ROBOT ASSISTED LAPAROSCOPIC RADICAL PROSTATECTOMY  09/15/2011   Procedure: ROBOTIC ASSISTED LAPAROSCOPIC RADICAL PROSTATECTOMY LEVEL 2;  Surgeon: Dutch Gray, MD;  Location: [facility];  Service: Urology;  Laterality: N/A;        . TONSILLECTOMY      Family History  Problem Relation Age of Onset  . Diabetes Mother   . Hypertension Mother   . Diverticulosis Mother   . Heart disease Father 98       myoc infarction  . Prostate cancer Brother   . Colon cancer Neg Hx     Social History:  reports that he quit smoking about 49 years ago. He has never used smokeless tobacco. He reports that he does not drink alcohol or use drugs.   Review of Systems:  Wt Readings from Last 3 Encounters:  11/25/17 215 lb 3.2 oz (97.6 kg)  08/26/17 220 lb (99.8 kg)  05/27/17 218 lb 3.2 oz (99 kg)    Examination:   BP 128/72 (BP Location: Left Arm,  Patient Position: Sitting, Cuff Size: Normal)   Pulse 70   Ht 6\' 2"  (1.88 m)   Wt 215 lb 3.2 oz (97.6 kg)   SpO2 97%   BMI 27.63 kg/m           THYROID:  Right lobe of thyroid is about 2.5-3 times normal, best felt on swallowing, relatively firm and slightly irregular  Left lobe is palpable only on swallowing, mostly medially and is ill-defined   Assessment/Plan:  Multinodular goiter with a benign dominant  nodule on the left side which is isoechoic  Although he feels a lump sensation when he swallows on the left side he is not having any difficulty swallowing food or drink and no choking or pressure sensation  Right lobe is also palpable as before Previously has been euthyroid  Since his dominant left lobe nodule was relatively large and he still has significant right-sided thyroid enlargement will do a follow-up ultrasound today to ensure stability. We will recheck thyroid levels today also   Elayne Snare 11/25/2017  Note: This office note was prepared with Dragon voice recognition system technology. Any transcriptional errors that result from this process are unintentional.

## 2017-12-01 ENCOUNTER — Ambulatory Visit
Admission: RE | Admit: 2017-12-01 | Discharge: 2017-12-01 | Disposition: A | Discharge status: Home / Self Care | Payer: Medicare Other | Source: Ambulatory Visit | Attending: Endocrinology | Admitting: Endocrinology

## 2017-12-01 DIAGNOSIS — E042 Nontoxic multinodular goiter: Secondary | ICD-10-CM

## 2017-12-09 NOTE — Progress Notes (Signed)
Please call to let patient know that the ultrasound no change from before, no biopsy needed

## 2017-12-11 DIAGNOSIS — Z7901 Long term (current) use of anticoagulants: Secondary | ICD-10-CM | POA: Diagnosis not present

## 2017-12-11 DIAGNOSIS — Z23 Encounter for immunization: Secondary | ICD-10-CM | POA: Diagnosis not present

## 2017-12-11 DIAGNOSIS — Z79899 Other long term (current) drug therapy: Secondary | ICD-10-CM | POA: Diagnosis not present

## 2017-12-11 DIAGNOSIS — S80811A Abrasion, right lower leg, initial encounter: Secondary | ICD-10-CM | POA: Diagnosis not present

## 2017-12-11 DIAGNOSIS — S80812A Abrasion, left lower leg, initial encounter: Secondary | ICD-10-CM | POA: Diagnosis not present

## 2017-12-13 ENCOUNTER — Encounter: Payer: Self-pay | Admitting: Family Medicine

## 2017-12-14 ENCOUNTER — Encounter: Payer: Self-pay | Admitting: Family Medicine

## 2017-12-14 ENCOUNTER — Ambulatory Visit (INDEPENDENT_AMBULATORY_CARE_PROVIDER_SITE_OTHER): Payer: Medicare Other | Admitting: Family Medicine

## 2017-12-14 VITALS — BP 140/80 | HR 55 | Temp 98.6°F | Ht 74.0 in | Wt 214.8 lb

## 2017-12-14 DIAGNOSIS — S81811A Laceration without foreign body, right lower leg, initial encounter: Secondary | ICD-10-CM

## 2017-12-14 DIAGNOSIS — H532 Diplopia: Secondary | ICD-10-CM

## 2017-12-14 NOTE — Patient Instructions (Addendum)
Schedule for the 1 pm same day slot on Friday for follow up  Change dressing daily- Roselyn Reef will show you how. Can get extra supplies at medical supply store. Change once a day. Watch out for signs of infection like expanding redness, worsening pain, fever, chills, nausea or vomiting  Ok to shower- take bandage down and do not scrub open area. Then can redress after that.   You declined stroke/TIA workup with the double vision. I would recommend updated ultrasound of the neck and MRI of the brain. If you had recurrent double vision you agreed to proceed forward. I would say if it lasts more than 5 minutes go to the ER. If you can- close the right eye and see if it still looks like double vision. Then do opposite with left eye

## 2017-12-14 NOTE — Progress Notes (Signed)
Subjective:  Elijah [name] is a 77 y.o. year old very pleasant male patient who presents for/with See problem oriented charting ROS- minimal leg pain. Some drainage from leg. No fever or chills. No edema.    Past Medical History-  Patient Active Problem List   Diagnosis Date Noted  . History of CVA (cerebrovascular accident) 05/14/2016    Priority: High  . History of melanoma 12/04/2015    Priority: High  . Atrial fibrillation ([place]) 04/06/2007    Priority: High  . Hyperglycemia 08/13/2016    Priority: Medium  . Thyroid nodule     Priority: Medium  . GERD (gastroesophageal reflux disease) 12/04/2015    Priority: Medium  . CKD (chronic kidney disease), stage III ([place]) 12/07/2014    Priority: Medium  . History of prostate cancer 09/23/2012    Priority: Medium  . 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 Essential hypertension 04/07/2007    Priority: Medium  . Hyperlipidemia 04/06/2007    Priority: Medium  . Gross hematuria 02/12/2017    Priority: Low  . Osteoarthritis of left hip 08/09/2014    Priority: Low  . Actinic keratosis 06/05/2014    Priority: Low  . Double vision 12/14/2017  . Hx of adenomatous colonic polyps 06/05/2011    Medications- reviewed and updated Current Outpatient Medications  Medication Sig Dispense Refill  . acetaminophen (TYLENOL) 500 MG tablet Take 500 mg by mouth 2 (two) times daily.     . Cholecalciferol (VITAMIN D PO) Take 1 tablet by mouth daily. 1000units daily    . metoprolol succinate (TOPROL-XL) 50 MG 24 hr tablet TAKE 1 TABLET BY MOUTH DAILY 90 tablet 1  . ranitidine (ZANTAC) 150 MG tablet Take 1 tablet (150 mg total) by mouth 2 (two) times daily. 180 tablet 3  . simvastatin (ZOCOR) 20 MG tablet TAKE 1 TABLET BY MOUTH ONCE DAILY 90 tablet 1  . XARELTO 20 MG TABS tablet TAKE 1 TABLET BY MOUTH ONCE DAILY 90 tablet 1   No current facility-administered medications for this visit.     Objective: BP  140/80 (BP Location: Left Arm, Patient Position: Sitting, Cuff Size: Large)   Pulse (!) 55   Temp 98.6 F (37 C) (Oral)   Ht 6\' 2"  (1.88 m)   Wt 214 lb 12.8 oz (97.4 kg)   SpO2 98%   BMI 27.58 kg/m  Gen: NAD, resting comfortably CV: irregularly irregular no murmurs rubs or gallops Lungs: CTAB no crackles, wheeze, rhonchi Abdomen: soft/nontender Ext: no edema Skin: warm, dry, laceration of 23 cm as noted below- no surrounding erythema Neuro: CN II-XII intact, sensation and reflexes normal throughout, 5/5 muscle strength in bilateral upper and lower extremities. Normal finger to nose. Normal rapid alternating movements. No pronator drift. Normal romberg. Normal gait.       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 Had 12 of 1 inch thick plywood that he was trying to balance to clean behind and they got off balance and heavy weight pushed him back- luckily did not get fully underneath them- but they did land on his shin and scrape the skin off almost the entire shin. Went to urgent care and they sent him to the emergency room. This happened Friday afternoon- he was to come back for a recheck but he was heading back home to the [place] and decided to come here.  A/P: up to date on Td- given on Friday in the  ER. Advised patient on how to do dressing changes- see avs. Discussed warning signs for sooner follow up. He would like to follow up with [place] so encouraged him to schedule for Friday at 1 pm  Double vision S: patient with double vision for under 5 minutes twice in last month. One while driving to [place] and slow downed to pull over and resolved. No other neurological symptoms A/P: encouraged patient to follow up with [place] if recurrence. Normal neuro exam- he is declining workup - "You declined stroke/TIA workup with the double vision. I would recommend updated ultrasound of the neck and MRI of the brain. If you had recurrent double vision you agreed  to proceed forward. I would say if it lasts more than 5 minutes go to the ER. If you can- close the right eye and see if it still looks like double vision. Then do opposite with left eye"  BP slightly high- recheck on Friday with evaluation of wound BP Readings from Last 3 Encounters:  12/14/17 140/80  11/25/17 128/72  08/26/17 114/68   Future Appointments  Date Time Provider [place]  12/18/2017  1:00 PM [name], MD [facility]  03/03/2018  8:15 AM [name], MD [facility]  03/29/2018  9:45 AM Thompson Grayer, MD [facility]  11/24/2018  1:00 PM Elayne Snare, MD [facility] None   Return precautions advised.  [name], MD

## 2017-12-14 NOTE — Assessment & Plan Note (Signed)
S: patient with double vision for under 5 minutes twice in last month. One while driving to [place] and slow downed to pull over and resolved. No other neurological symptoms A/P: encouraged patient to follow up with [place] if recurrence. Normal neuro exam- he is declining workup - "You declined stroke/TIA workup with the double vision. I would recommend updated ultrasound of the neck and MRI of the brain. If you had recurrent double vision you agreed to proceed forward. I would say if it lasts more than 5 minutes go to the ER. If you can- close the right eye and see if it still looks like double vision. Then do opposite with left eye"

## 2017-12-18 ENCOUNTER — Ambulatory Visit (INDEPENDENT_AMBULATORY_CARE_PROVIDER_SITE_OTHER): Payer: Medicare Other | Admitting: Family Medicine

## 2017-12-18 ENCOUNTER — Encounter: Payer: Self-pay | Admitting: Family Medicine

## 2017-12-18 VITALS — BP 130/82 | HR 68 | Temp 98.2°F | Ht 74.0 in | Wt 214.8 lb

## 2017-12-18 DIAGNOSIS — L03115 Cellulitis of right lower limb: Secondary | ICD-10-CM

## 2017-12-18 DIAGNOSIS — N183 Chronic kidney disease, stage 3 unspecified: Secondary | ICD-10-CM

## 2017-12-18 MED ORDER — CEPHALEXIN 500 MG PO CAPS: 500.0000 mg | ORAL_CAPSULE | Freq: Two times a day (BID) | ORAL | 0 refills | Status: DC

## 2017-12-18 NOTE — Progress Notes (Signed)
Subjective:  Elijah [name] is a 77 y.o. year old very pleasant male patient who presents for/with See problem oriented charting ROS-no fever, chills, nausea, vomiting.  Does note some extending redness around wound.  Past Medical History-  Patient Active Problem List   Diagnosis Date Noted  . History of CVA (cerebrovascular accident) 05/14/2016    Priority: High  . History of melanoma 12/04/2015    Priority: High  . Atrial fibrillation ([place]) 04/06/2007    Priority: High  . Hyperglycemia 08/13/2016    Priority: Medium  . Thyroid nodule     Priority: Medium  . GERD (gastroesophageal reflux disease) 12/04/2015    Priority: Medium  . CKD (chronic kidney disease), stage III ([place]) 12/07/2014    Priority: Medium  . History of prostate cancer 09/23/2012    Priority: Medium  . 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 Essential hypertension 04/07/2007    Priority: Medium  . Hyperlipidemia 04/06/2007    Priority: Medium  . Gross hematuria 02/12/2017    Priority: Low  . Osteoarthritis of left hip 08/09/2014    Priority: Low  . Actinic keratosis 06/05/2014    Priority: Low  . Double vision 12/14/2017  . Hx of adenomatous colonic polyps 06/05/2011    Medications- reviewed and updated Current Outpatient Medications  Medication Sig Dispense Refill  . acetaminophen (TYLENOL) 500 MG tablet Take 500 mg by mouth 2 (two) times daily.     . Cholecalciferol (VITAMIN D PO) Take 1 tablet by mouth daily. 1000units daily    . metoprolol succinate (TOPROL-XL) 50 MG 24 hr tablet TAKE 1 TABLET BY MOUTH DAILY 90 tablet 1  . ranitidine (ZANTAC) 150 MG tablet Take 1 tablet (150 mg total) by mouth 2 (two) times daily. 180 tablet 3  . simvastatin (ZOCOR) 20 MG tablet TAKE 1 TABLET BY MOUTH ONCE DAILY 90 tablet 1  . XARELTO 20 MG TABS tablet TAKE 1 TABLET BY MOUTH ONCE DAILY 90 tablet 1  . cephALEXin (KEFLEX) 500 MG capsule Take 1 capsule (500 mg total) by mouth 2  (two) times daily for 5 days. 10 capsule 0   No current facility-administered medications for this visit.     Objective: BP 130/82 (BP Location: Left Arm, Patient Position: Sitting, Cuff Size: Large)   Pulse 68   Temp 98.2 F (36.8 C) (Oral)   Ht 6\' 2"  (1.88 m)   Wt 214 lb 12.8 oz (97.4 kg)   SpO2 100%   BMI 27.58 kg/m  Gen: NAD, resting comfortably CV: Irregularly irregular no murmurs rubs or gallops Lungs: CTAB no crackles, wheeze, rhonchi Abdomen: soft/nontender/nondistended/normal bowel sounds.  Ext: trace edema on right lower leg, none on left Skin: warm, dry, surrounding prior wound-there is some erythema and warmth.  Patient tender outside of the wound in this area.   Assessment/Plan:  Cellulitis right lower leg  s: patient returns for follow up evaluation of leg laceration along right shin.  He has had a slight increase in pain.  He is noted some increasing redness near the wound.  There is also more than previous.  He denies feeling sick overall.  Pain is mild and worse with palpation. A/P: This looks like an early cellulitis-we will treat with Keflex twice a day for 5 days given GFR under 60/CKD III.  We will follow-up next week before he makes his trip to [place] on Thursday.  Could consider extending antibiotics to 10 days if needed.  Also gave sooner  return precautions  Future Appointments  Date Time Provider [place]  03/03/2018  8:15 AM [name], MD [facility]  03/29/2018  9:45 AM Thompson Grayer, MD [facility]  11/24/2018  1:00 PM Elayne Snare, MD [facility] None   From avs "Take 5 days of antibiotics- twice a day, try to have something on your stomach  See [place] back sooner if worsening/expanding redness, fever, chills, worsening pain of the leg  See me on Thursday at 9 45 AM- schedule this before you leave"  Meds ordered this encounter  Medications  . cephALEXin (KEFLEX) 500 MG capsule    Sig: Take 1 capsule (500 mg total)  by mouth 2 (two) times daily for 5 days.    Dispense:  10 capsule    Refill:  0    Return precautions advised.  [name], MD

## 2017-12-18 NOTE — Patient Instructions (Addendum)
Take 5 days of antibiotics- twice a day, try to have something on your stomach  See [place] back sooner if worsening/expanding redness, fever, chills, worsening pain of the leg  See me on Thursday at 9 45 AM- schedule this before you leave

## 2017-12-20 ENCOUNTER — Encounter: Payer: Self-pay | Admitting: Family Medicine

## 2017-12-21 ENCOUNTER — Encounter: Payer: Self-pay | Admitting: Family Medicine

## 2017-12-21 ENCOUNTER — Ambulatory Visit (INDEPENDENT_AMBULATORY_CARE_PROVIDER_SITE_OTHER): Payer: Medicare Other | Admitting: Family Medicine

## 2017-12-21 VITALS — BP 128/70 | HR 68 | Temp 98.1°F | Ht 74.0 in | Wt 214.0 lb

## 2017-12-21 DIAGNOSIS — S81801S Unspecified open wound, right lower leg, sequela: Secondary | ICD-10-CM

## 2017-12-21 DIAGNOSIS — G609 Hereditary and idiopathic neuropathy, unspecified: Secondary | ICD-10-CM

## 2017-12-21 MED ORDER — CEPHALEXIN 500 MG PO CAPS: 500.0000 mg | ORAL_CAPSULE | Freq: Two times a day (BID) | ORAL | 0 refills | Status: AC

## 2017-12-21 NOTE — Progress Notes (Signed)
Patient: Elijah [name] MRN: [id number] DOB: 1941/02/25 PCP: [name], MD     Subjective:  Chief Complaint  Patient presents with  . Wound Check    HPI: The patient is a 77 y.o. male who presents today for wound check/cellulitis follow up. He was seen on Friday and started on keflex 500mg  BID for 5 days. He is here today because his wife had concerns that it may be getting infected. There is some white tissue superficially on the borders of the wound. No spreading redness or other changes in the wound. He has been dressing his wound with non adherent gauze pads and wrapped in coban. He has had no fevers/chills. Otherwise, feels well.   Review of Systems  Constitutional: Negative for chills and fever.  Respiratory: Negative for shortness of breath.   Cardiovascular: Negative for chest pain.  Musculoskeletal: Negative for joint swelling.  Skin: Positive for color change and wound.  Psychiatric/Behavioral: Negative for confusion.    Allergies Patient is allergic to ciprofloxacin and flagyl [metronidazole].  Past Medical History Patient  has a past medical history of Adenomatous colon polyp, Arthritis (09-10-11), Atrial fibrillation ([place]) (09-10-11), Cancer ([place]) (09-10-11), Diverticulosis, Hemorrhoid, adenomatous colonic polyps (06/05/2011), Hyperlipidemia, Hypertension, Neuropathy of foot (09-10-11), Peripheral neuropathy, Prostate cancer ([place]), and Stroke ([place]).  Surgical History Patient  has a past surgical history that includes Colonoscopy (04/2003, 06/05/2011); Tonsillectomy; Eye surgery (09-10-11); and Robot assisted laparoscopic radical prostatectomy (09/15/2011).  Family History Pateint's family history includes Diabetes in his mother; Diverticulosis in his mother; Heart disease (age of onset: 11) in his father; Hypertension in his mother; Prostate cancer in his brother.  Social History Patient  reports that he quit smoking about 49 years ago. He has never used smokeless tobacco. He  reports that he does not drink alcohol or use drugs.    Objective: Vitals:   12/21/17 1131  BP: 128/70  Pulse: 68  Temp: 98.1 F (36.7 C)  TempSrc: Oral  SpO2: 98%  Weight: 214 lb (97.1 kg)  Height: 6\' 2"  (1.88 m)    Body mass index is 27.48 kg/m.  Physical Exam  Constitutional: He is oriented to person, place, and time. He appears well-developed and well-nourished.  Neurological: He is alert and oriented to person, place, and time.  Skin: He is not diaphoretic.  Large vertical open wound on right anterior tibia. No change from previous picture documented in epic. He has granulation tissue on border of wound. Minimal surrounding erythema, but no change from previous visit. Toes/foot are blue and purple in color.   Vitals reviewed.    image taken during appointment.   Assessment/plan: 1. Wound of right leg, sequela Concern for slow healing due to neuropathy in his leg/foot. Discussed wound care and that he needs to gently scrub away granulation tissue. Can use hibaclens otc. Want wound to bleed to bring in oxygen to help promote healing. Also, I want him to use mepilex dressings. He can get this over the counter and discussed in detail the advantages to the foam dressing. He can change dressing every 72 hours and it helps promote healing. Extending course of keflex for 5 more days for total of 10 days. Wound consider wound care secondary to neuropathy and poor healing if doesn't make some progress since he is not keen to using dressing advised. Also advised him NOT to ever use neosporin as he can have a reaction to the neomycin. He has close follow up with his PCP this week on Thursday, but  precautions given for fever/spreading erythema, change in pain, drainage, etc.. To come in sooner or go to ER.     Return for keep f/u appointment this week .     Orma Flaming, MD [place]  12/21/2017

## 2017-12-21 NOTE — Patient Instructions (Signed)
Get mepilex wound dressing. Ask pharmacist about this. White tissue is granulation tissue. Try to gently scrub this off. Want it to bleed to bring in oxygen to heal. F/u with dr. [name] on Thursday

## 2017-12-24 ENCOUNTER — Ambulatory Visit (INDEPENDENT_AMBULATORY_CARE_PROVIDER_SITE_OTHER): Payer: Medicare Other | Admitting: Family Medicine

## 2017-12-24 ENCOUNTER — Encounter: Payer: Self-pay | Admitting: Family Medicine

## 2017-12-24 VITALS — BP 112/80 | HR 57 | Temp 98.5°F | Ht 74.0 in | Wt 216.0 lb

## 2017-12-24 DIAGNOSIS — S81801S Unspecified open wound, right lower leg, sequela: Secondary | ICD-10-CM | POA: Diagnosis not present

## 2017-12-24 DIAGNOSIS — L03115 Cellulitis of right lower limb: Secondary | ICD-10-CM

## 2017-12-24 NOTE — Progress Notes (Signed)
Subjective:  Elijah [name] is a 77 y.o. year old very pleasant male patient who presents for/with See problem oriented charting ROS-no fever or chills.  No expanding redness. No chest pain or shortness of breath. No headache or blurry vision.   Past Medical History-  Patient Active Problem List   Diagnosis Date Noted  . History of CVA (cerebrovascular accident) 05/14/2016    Priority: High  . History of melanoma 12/04/2015    Priority: High  . Atrial fibrillation ([place]) 04/06/2007    Priority: High  . Hyperglycemia 08/13/2016    Priority: Medium  . Thyroid nodule     Priority: Medium  . GERD (gastroesophageal reflux disease) 12/04/2015    Priority: Medium  . CKD (chronic kidney disease), stage III ([place]) 12/07/2014    Priority: Medium  . History of prostate cancer 09/23/2012    Priority: Medium  . 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 Essential hypertension 04/07/2007    Priority: Medium  . Hyperlipidemia 04/06/2007    Priority: Medium  . Gross hematuria 02/12/2017    Priority: Low  . Osteoarthritis of left hip 08/09/2014    Priority: Low  . Actinic keratosis 06/05/2014    Priority: Low  . Double vision 12/14/2017  . Hx of adenomatous colonic polyps 06/05/2011    Medications- reviewed and updated Current Outpatient Medications  Medication Sig Dispense Refill  . acetaminophen (TYLENOL) 500 MG tablet Take 500 mg by mouth 2 (two) times daily.     . cephALEXin (KEFLEX) 500 MG capsule Take 1 capsule (500 mg total) by mouth 2 (two) times daily for 5 days. 10 capsule 0  . Cholecalciferol (VITAMIN D PO) Take 1 tablet by mouth daily. 1000units daily    . metoprolol succinate (TOPROL-XL) 50 MG 24 hr tablet TAKE 1 TABLET BY MOUTH DAILY 90 tablet 1  . ranitidine (ZANTAC) 150 MG tablet Take 1 tablet (150 mg total) by mouth 2 (two) times daily. 180 tablet 3  . simvastatin (ZOCOR) 20 MG tablet TAKE 1 TABLET BY MOUTH ONCE DAILY 90 tablet 1  .  XARELTO 20 MG TABS tablet TAKE 1 TABLET BY MOUTH ONCE DAILY 90 tablet 1   No current facility-administered medications for this visit.     Objective: BP 112/80 (BP Location: Left Arm, Patient Position: Sitting, Cuff Size: Normal)   Pulse (!) 57   Temp 98.5 F (36.9 C) (Oral)   Ht 6\' 2"  (1.88 m)   Wt 216 lb (98 kg)   SpO2 100%   BMI 27.73 kg/m  Gen: NAD, resting comfortably CV: RRR other than being slightly bradycardic no murmurs rubs or gallops Lungs: CTAB no crackles, wheeze, rhonchi Abdomen: soft/nondistended Ext: no edema on left leg, trace edema right leg around wound Skin: warm, dry, linear wound on right shin-no surrounding warmth anymore.  Mild surrounding erythema but not worsened.  Wound appears to be closing some/narrowing Neuro: grossly normal, moves all extremities      Assessment/Plan:   Cellulitis of right anterior lower leg  Wound of right leg, sequela S: Patient returns for follow-up of wound of his right lower leg.  He is no longer tender on examination around the wound.  There is no longer any warmth.  He still does have some erythema.  He is still on Keflex.  He has no systemic symptoms.  He has no expanding redness. A/P: Appears cellulitis is resolving/resolved-encouraged him to finish the Keflex.  He would like to let the wound  air dry-I think this is reasonable.  He still wants to cover it at night we gave him some supplies to do so.  Future Appointments  Date Time Provider [place]  03/03/2018  8:15 AM [name], MD [facility]  03/29/2018  9:45 AM Thompson Grayer, MD [facility]  11/24/2018  1:00 PM Elayne Snare, MD [facility] None   Return precautions advised.  We will change to as needed follow-up [name], MD

## 2017-12-24 NOTE — Patient Instructions (Addendum)
Health Maintenance Due  Topic Date Due  . COLONOSCOPY - Stated his G.I. Dr. said  he was in no rush" 06/02/2016   Finish Keflex  Okay to air dry during the day-please avoid any trauma to the leg though  Okay to cover wound at night.  Make sure to take dressing off daily and wash the wound.  See [place] back if needed if wound does not continue to improve or if you were to have worsening symptoms such as expanding redness or worsening pain or fever

## 2018-01-05 ENCOUNTER — Encounter: Payer: Self-pay | Admitting: Family Medicine

## 2018-02-17 ENCOUNTER — Encounter: Payer: Self-pay | Admitting: Family Medicine

## 2018-02-17 ENCOUNTER — Ambulatory Visit (INDEPENDENT_AMBULATORY_CARE_PROVIDER_SITE_OTHER): Payer: Medicare Other | Admitting: Family Medicine

## 2018-02-17 VITALS — BP 124/78 | HR 63 | Temp 99.0°F | Ht 74.0 in | Wt 211.8 lb

## 2018-02-17 DIAGNOSIS — X32XXXD Exposure to sunlight, subsequent encounter: Secondary | ICD-10-CM | POA: Diagnosis not present

## 2018-02-17 DIAGNOSIS — B9689 Other specified bacterial agents as the cause of diseases classified elsewhere: Secondary | ICD-10-CM

## 2018-02-17 DIAGNOSIS — L821 Other seborrheic keratosis: Secondary | ICD-10-CM | POA: Diagnosis not present

## 2018-02-17 DIAGNOSIS — N183 Chronic kidney disease, stage 3 unspecified: Secondary | ICD-10-CM

## 2018-02-17 DIAGNOSIS — J019 Acute sinusitis, unspecified: Secondary | ICD-10-CM

## 2018-02-17 DIAGNOSIS — Z85820 Personal history of malignant melanoma of skin: Secondary | ICD-10-CM | POA: Diagnosis not present

## 2018-02-17 DIAGNOSIS — Z1283 Encounter for screening for malignant neoplasm of skin: Secondary | ICD-10-CM | POA: Diagnosis not present

## 2018-02-17 DIAGNOSIS — D045 Carcinoma in situ of skin of trunk: Secondary | ICD-10-CM | POA: Diagnosis not present

## 2018-02-17 DIAGNOSIS — Z08 Encounter for follow-up examination after completed treatment for malignant neoplasm: Secondary | ICD-10-CM | POA: Diagnosis not present

## 2018-02-17 DIAGNOSIS — L570 Actinic keratosis: Secondary | ICD-10-CM | POA: Diagnosis not present

## 2018-02-17 MED ORDER — AMOXICILLIN-POT CLAVULANATE 875-125 MG PO TABS: 1.0000 | ORAL_TABLET | Freq: Two times a day (BID) | ORAL | 0 refills | Status: AC

## 2018-02-17 NOTE — Patient Instructions (Addendum)
Health Maintenance Due  Topic Date Due  . COLONOSCOPY - to be discussed with GI per Dr. Marla Roe last note  06/02/2016   I would also like for you to sign up for an annual wellness visit with one of our nurses, Cassie or Manuela Schwartz, who both specialize in the annual wellness visit. This is a free benefit under medicare that may help [place] find additional ways to help you. Some highlights are reviewing medications, lifestyle, and doing a dementia screen.  Sinsusitis Bacterial based on: Symptoms  Now at 10 days and not improving  Treatment: -considered steroid: we opted out for now given he has had some high blood sugars in past (at risk for diabetes) -other symptomatic care with mucinex -Antibiotic indicated: yes  Finally, we reviewed reasons to return to care including if symptoms worsen or persist or new concerns arise (particularly fever or shortness of breath). Luckily we will be seeing each other next week for follow up anyway. He plans to come fasting.   Meds ordered this encounter  Medications  . amoxicillin-clavulanate (AUGMENTIN) 875-125 MG tablet    Sig: Take 1 tablet by mouth 2 (two) times daily for 7 days.    Dispense:  14 tablet    Refill:  0

## 2018-02-17 NOTE — Progress Notes (Signed)
PCP: [name], MD  Subjective:  Elijah [name] is a 77 y.o. year old very pleasant male patient who presents with sinusitis symptoms including nasal congestion, sinus tenderness  Last Monday - had new ac put in- exposed to a lot of dust, humidity while this was being changed out. Started coughing in the midst of this and then it has worsened since that time. Having coughing fits and coughing up yellow globs.  Blowing out thick yellow green. Mild sinus pressure.   Day 10 today and not getting any better -previous treatments: rest, hydration -sick contacts/travel/risks: denies flu exposure. Wife has not become ill  ROS-denies fever, SOB, NVD, tooth pain. Occasional chills.   Pertinent Past Medical History-  Patient Active Problem List   Diagnosis Date Noted  . History of CVA (cerebrovascular accident) 05/14/2016    Priority: High  . History of melanoma 12/04/2015    Priority: High  . Atrial fibrillation ([place]) 04/06/2007    Priority: High  . Hyperglycemia 08/13/2016    Priority: Medium  . Thyroid nodule     Priority: Medium  . GERD (gastroesophageal reflux disease) 12/04/2015    Priority: Medium  . CKD (chronic kidney disease), stage III ([place]) 12/07/2014    Priority: Medium  . History of prostate cancer 09/23/2012    Priority: Medium  . 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 Essential hypertension 04/07/2007    Priority: Medium  . Hyperlipidemia 04/06/2007    Priority: Medium  . Gross hematuria 02/12/2017    Priority: Low  . Osteoarthritis of left hip 08/09/2014    Priority: Low  . Actinic keratosis 06/05/2014    Priority: Low  . Double vision 12/14/2017  . Hx of adenomatous colonic polyps 06/05/2011    Medications- reviewed  Current Outpatient Medications  Medication Sig Dispense Refill  . acetaminophen (TYLENOL) 500 MG tablet Take 500 mg by mouth 2 (two) times daily.     . Cholecalciferol (VITAMIN D PO) Take 1 tablet  by mouth daily. 1000units daily    . metoprolol succinate (TOPROL-XL) 50 MG 24 hr tablet TAKE 1 TABLET BY MOUTH DAILY 90 tablet 1  . ranitidine (ZANTAC) 150 MG tablet Take 1 tablet (150 mg total) by mouth 2 (two) times daily. 180 tablet 3  . simvastatin (ZOCOR) 20 MG tablet TAKE 1 TABLET BY MOUTH ONCE DAILY 90 tablet 1  . XARELTO 20 MG TABS tablet TAKE 1 TABLET BY MOUTH ONCE DAILY 90 tablet 1   No current facility-administered medications for this visit.     Objective: BP 124/78 (BP Location: Left Arm, Patient Position: Sitting, Cuff Size: Large)   Pulse 63   Temp 99 F (37.2 C) (Oral)   Ht 6\' 2"  (1.88 m)   Wt 211 lb 12.8 oz (96.1 kg)   SpO2 98%   BMI 27.19 kg/m  Gen: NAD, resting comfortably HEENT: Turbinates erythematous with yellow/greeb drainage, TM normal, pharynx mildly erythematous with no tonsilar exudate or edema, some maxillary sinus tenderness CV: RRR no murmurs rubs or gallops Lungs: CTAB no crackles, wheeze, rhonchi Ext: no edema Skin: warm, dry, no rash  Assessment/Plan:  Sinsusitis Bacterial based on: Symptoms  Now at 10 days and not improving  Wife worried about chronic bronchitis- he has no shortness of breath and reassuring lung exam with sinus findings. Possible he has viral bronchitis as well but not highly suspicious- can reassess next week. High risk patient with a fib history, CKD III (do not need  to adjust Augmentin with gfr over 30), and history cva  Treatment: -considered steroid: we opted out for now given he has had some high blood sugars in past (at risk for diabetes) -other symptomatic care with mucinex -Antibiotic indicated: yes  Finally, we reviewed reasons to return to care including if symptoms worsen or persist or new concerns arise (particularly fever or shortness of breath). Luckily we will be seeing each other next week for follow up anyway. He plans to come fasting.   Meds ordered this encounter  Medications  . amoxicillin-clavulanate  (AUGMENTIN) 875-125 MG tablet    Sig: Take 1 tablet by mouth 2 (two) times daily for 7 days.    Dispense:  14 tablet    Refill:  0    [name], MD

## 2018-02-24 ENCOUNTER — Ambulatory Visit (INDEPENDENT_AMBULATORY_CARE_PROVIDER_SITE_OTHER): Payer: Medicare Other | Admitting: Family Medicine

## 2018-02-24 ENCOUNTER — Encounter: Payer: Self-pay | Admitting: Family Medicine

## 2018-02-24 VITALS — BP 108/64 | HR 68 | Temp 98.1°F | Ht 74.0 in | Wt 209.2 lb

## 2018-02-24 DIAGNOSIS — K219 Gastro-esophageal reflux disease without esophagitis: Secondary | ICD-10-CM

## 2018-02-24 DIAGNOSIS — R739 Hyperglycemia, unspecified: Secondary | ICD-10-CM

## 2018-02-24 DIAGNOSIS — E785 Hyperlipidemia, unspecified: Secondary | ICD-10-CM

## 2018-02-24 DIAGNOSIS — I482 Chronic atrial fibrillation: Secondary | ICD-10-CM | POA: Diagnosis not present

## 2018-02-24 DIAGNOSIS — I4821 Permanent atrial fibrillation: Secondary | ICD-10-CM

## 2018-02-24 LAB — COMPREHENSIVE METABOLIC PANEL
ALK PHOS: 81 U/L (ref 39–117)
ALT: 27 U/L (ref 0–53)
AST: 21 U/L (ref 0–37)
Albumin: 4 g/dL (ref 3.5–5.2)
BILIRUBIN TOTAL: 0.7 mg/dL (ref 0.2–1.2)
BUN: 27 mg/dL — AB (ref 6–23)
CO2: 27 mEq/L (ref 19–32)
CREATININE: 1.58 mg/dL — AB (ref 0.40–1.50)
Calcium: 9.4 mg/dL (ref 8.4–10.5)
Chloride: 106 mEq/L (ref 96–112)
GFR: 45.47 mL/min — AB (ref >60.00)
GLUCOSE: 105 mg/dL — AB (ref 70–99)
Potassium: 4.7 mEq/L (ref 3.5–5.1)
Sodium: 140 mEq/L (ref 135–145)
TOTAL PROTEIN: 6.7 g/dL (ref 6.0–8.3)

## 2018-02-24 LAB — CBC
HCT: 45.5 % (ref 39.0–52.0)
HEMOGLOBIN: 15.6 g/dL (ref 13.0–17.0)
MCHC: 34.3 g/dL (ref 30.0–36.0)
MCV: 92.3 fl (ref 78.0–100.0)
Platelets: 228 10*3/uL (ref 150.0–400.0)
RBC: 4.93 Mil/uL (ref 4.22–5.81)
RDW: 14.4 % (ref 11.5–15.5)
WBC: 8.1 10*3/uL (ref 4.0–10.5)

## 2018-02-24 LAB — POC URINALSYSI DIPSTICK (AUTOMATED)
BILIRUBIN UA: NEGATIVE
Blood, UA: NEGATIVE
GLUCOSE UA: NEGATIVE
KETONES UA: NEGATIVE
Leukocytes, UA: NEGATIVE
Nitrite, UA: NEGATIVE
Protein, UA: NEGATIVE
Urobilinogen, UA: 0.2 E.U./dL
pH, UA: 5.5 (ref 5.0–8.0)

## 2018-02-24 LAB — HEMOGLOBIN A1C: Hgb A1c MFr Bld: 6.1 % (ref 4.6–6.5)

## 2018-02-24 LAB — LDL CHOLESTEROL, DIRECT: Direct LDL: 69 mg/dL

## 2018-02-24 NOTE — Progress Notes (Signed)
Subjective:  Elijah [name] is a 77 y.o. year old very pleasant male patient who presents for/with See problem oriented charting ROS- cough in AM but much improved. No sinus pressure. No chest pain or shortness of breath   Past Medical History-  Patient Active Problem List   Diagnosis Date Noted  . History of CVA (cerebrovascular accident) 05/14/2016    Priority: High  . History of melanoma 12/04/2015    Priority: High  . Atrial fibrillation ([place]) 04/06/2007    Priority: High  . Hyperglycemia 08/13/2016    Priority: Medium  . Thyroid nodule     Priority: Medium  . GERD (gastroesophageal reflux disease) 12/04/2015    Priority: Medium  . CKD (chronic kidney disease), stage III ([place]) 12/07/2014    Priority: Medium  . History of prostate cancer 09/23/2012    Priority: Medium  . 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 Essential hypertension 04/07/2007    Priority: Medium  . Hyperlipidemia 04/06/2007    Priority: Medium  . Gross hematuria 02/12/2017    Priority: Low  . Osteoarthritis of left hip 08/09/2014    Priority: Low  . Actinic keratosis 06/05/2014    Priority: Low  . Double vision 12/14/2017  . Hx of adenomatous colonic polyps 06/05/2011    Medications- reviewed and updated Current Outpatient Medications  Medication Sig Dispense Refill  . acetaminophen (TYLENOL) 500 MG tablet Take 500 mg by mouth 2 (two) times daily.     Marland Kitchen amoxicillin-clavulanate (AUGMENTIN) 875-125 MG tablet Take 1 tablet by mouth 2 (two) times daily for 7 days. 14 tablet 0  . Cholecalciferol (VITAMIN D PO) Take 1 tablet by mouth daily. 1000units daily    . metoprolol succinate (TOPROL-XL) 50 MG 24 hr tablet TAKE 1 TABLET BY MOUTH DAILY 90 tablet 1  . ranitidine (ZANTAC) 150 MG tablet Take 1 tablet (150 mg total) by mouth 2 (two) times daily. 180 tablet 3  . simvastatin (ZOCOR) 20 MG tablet TAKE 1 TABLET BY MOUTH ONCE DAILY 90 tablet 1  . XARELTO 20 MG TABS  tablet TAKE 1 TABLET BY MOUTH ONCE DAILY 90 tablet 1   No current facility-administered medications for this visit.     Objective: BP 108/64 (BP Location: Left Arm, Patient Position: Sitting, Cuff Size: Large)   Pulse 68   Temp 98.1 F (36.7 C) (Oral)   Ht 6\' 2"  (1.88 m)   Wt 209 lb 3.2 oz (94.9 kg)   SpO2 100%   BMI 26.86 kg/m  Gen: NAD, resting comfortably Nasal turbinates without erythema, oropharynx normal CV: irregularly irregular no murmurs rubs or gallops Lungs: CTAB no crackles, wheeze, rhonchi Abdomen: soft/nontender/nondistended/normal bowel sounds.  Ext: trace edema Skin: warm, dry  Assessment/Plan:  Cough,congestion/sinus pressure much improved- sparing cough in AM at present  Hyperglycemia S: patient has lost 7 lbs in last 2 months  And 11 lbs from last a1c check Lab Results  Component Value Date   HGBA1C 6.0 08/26/2017   HGBA1C 5.9 (H) 05/14/2016  A/P: update a1c today- hopeful risk lower with improved weight  Hyperlipidemia S:  controlled on simvastatin 20mg  Lab Results  Component Value Date   CHOL 124 08/26/2017   HDL 34.50 (L) 08/26/2017   LDLCALC 73 08/26/2017   LDLDIRECT 75.0 08/13/2016   TRIG 82.0 08/26/2017   CHOLHDL 4 08/26/2017   A/P: continue current rx- update LDL. If bad cholesterol gets under 60 we agreed to reduce to 10mg  simvastatin once a day.  GERD (gastroesophageal reflux disease) S: reflux doing reasonably well on zantac 150mg  BID A/P: with weight loss he wants to try once a day- will try in the evening before dinner  Atrial fibrillation ([place]) S: a fib history- on xarelto 20mg  daily. Also on metoprolol 50mg  XR.  History of stroke during time he had declined anticoagulation A/P: continue current rx  Donalda Ewings was to encourage him to sign up for AWV  Future Appointments  Date Time Provider [place]  03/29/2018  9:45 AM Thompson Grayer, MD [facility]  11/24/2018  1:00 PM Elayne Snare, MD [facility] None   4-7  month follow up per his schedule  Lab/Order associations: Hyperlipidemia, unspecified hyperlipidemia type - Plan: CBC, Comprehensive metabolic panel, POCT Urinalysis Dipstick (Automated), LDL cholesterol, direct, CANCELED: Lipid panel  Hyperglycemia - Plan: Hemoglobin A1c  Gastroesophageal reflux disease without esophagitis  Permanent atrial fibrillation ([place])  Return precautions advised.  [name], MD

## 2018-02-24 NOTE — Assessment & Plan Note (Signed)
S: a fib history- on xarelto 20mg  daily. Also on metoprolol 50mg  XR.  History of stroke during time he had declined anticoagulation A/P: continue current rx

## 2018-02-24 NOTE — Addendum Note (Signed)
Addended by: Tomi Likens on: 02/24/2018 11:01 AM   Modules accepted: Orders

## 2018-02-24 NOTE — Assessment & Plan Note (Signed)
S: reflux doing reasonably well on zantac 150mg  BID A/P: with weight loss he wants to try once a day- will try in the evening before dinner

## 2018-02-24 NOTE — Assessment & Plan Note (Signed)
S:  controlled on simvastatin 20mg  Lab Results  Component Value Date   CHOL 124 08/26/2017   HDL 34.50 (L) 08/26/2017   LDLCALC 73 08/26/2017   LDLDIRECT 75.0 08/13/2016   TRIG 82.0 08/26/2017   CHOLHDL 4 08/26/2017   A/P: continue current rx- update LDL. If bad cholesterol gets under 60 we agreed to reduce to 10mg  simvastatin once a day.

## 2018-02-24 NOTE — Assessment & Plan Note (Signed)
S: patient has lost 7 lbs in last 2 months  And 11 lbs from last a1c check Lab Results  Component Value Date   HGBA1C 6.0 08/26/2017   HGBA1C 5.9 (H) 05/14/2016  A/P: update a1c today- hopeful risk lower with improved weight

## 2018-02-24 NOTE — Patient Instructions (Signed)
Please stop by lab before you go  No changes today- glad you are on the mend!

## 2018-03-01 ENCOUNTER — Other Ambulatory Visit: Payer: Self-pay | Admitting: Family Medicine

## 2018-03-03 ENCOUNTER — Ambulatory Visit: Payer: Medicare Other | Admitting: Family Medicine

## 2018-03-04 ENCOUNTER — Telehealth: Payer: Self-pay

## 2018-03-04 NOTE — Telephone Encounter (Signed)
-----   Message from [name], MD sent at 02/25/2018  9:56 PM EDT ----- Can we call patient to encourage him to sign up for an AWV?  Maybe if he knows our awesome Roselyn Reef is doing them he will sign up !?!?!?

## 2018-03-04 NOTE — Telephone Encounter (Signed)
Called patient and left a voicemail message asking of patient would return my call and schedule an AWV with me. I asked that he be scheduled on 03/05/18, a Tuesday, or Wednesday afternoon.

## 2018-03-08 ENCOUNTER — Encounter: Payer: Self-pay | Admitting: *Deleted

## 2018-03-08 NOTE — Telephone Encounter (Signed)
Pt returning your call. Pt will consider AWV. Please call. Pt has been out of town

## 2018-03-10 NOTE — Telephone Encounter (Signed)
Called patient and left a voicemail message asking patiet to return my call so that I could get him scheduled.

## 2018-03-11 NOTE — Telephone Encounter (Signed)
Patient is scheduled for next week.

## 2018-03-14 ENCOUNTER — Other Ambulatory Visit: Payer: Self-pay | Admitting: Family Medicine

## 2018-03-16 ENCOUNTER — Ambulatory Visit (INDEPENDENT_AMBULATORY_CARE_PROVIDER_SITE_OTHER): Payer: Medicare Other

## 2018-03-16 VITALS — BP 122/76 | HR 50 | Ht 74.0 in | Wt 215.6 lb

## 2018-03-16 DIAGNOSIS — Z0000 Encounter for general adult medical examination without abnormal findings: Secondary | ICD-10-CM

## 2018-03-16 NOTE — Patient Instructions (Addendum)
Mr. Elijah [name] , Thank you for taking time to come for your Medicare Wellness Visit. I appreciate your ongoing commitment to your health goals. Please review the following plan we discussed and let me know if I can assist you in the future.   These are the goals we discussed: Goals    . Exercise 150 min/wk Moderate Activity     Maintain an active lifestyle working out       This is a list of the screening recommended for you and due dates:  Health Maintenance  Topic Date Due  . Colon Cancer Screening  06/02/2016  . Flu Shot  03/04/2018  . Tetanus Vaccine  12/12/2027  . Pneumonia vaccines  Completed   Preventive Care for Adults  A healthy lifestyle and preventive care can promote health and wellness. Preventive health guidelines for adults include the following key practices.  . A routine yearly physical is a good way to check with your health care provider about your health and preventive screening. It is a chance to share any concerns and updates on your health and to receive a thorough exam.  . Visit your dentist for a routine exam and preventive care every 6 months. Brush your teeth twice a day and floss once a day. Good oral hygiene prevents tooth decay and gum disease.  . The frequency of eye exams is based on your age, health, family medical history, use  of contact lenses, and other factors. Follow your health care provider's recommendations for frequency of eye exams.  . Eat a healthy diet. Foods like vegetables, fruits, whole grains, low-fat dairy products, and lean protein foods contain the nutrients you need without too many calories. Decrease your intake of foods high in solid fats, added sugars, and salt. Eat the right amount of calories for you. Get information about a proper diet from your health care provider, if necessary.  . Regular physical exercise is one of the most important things you can do for your health. Most adults should get at least 150 minutes of  moderate-intensity exercise (any activity that increases your heart rate and causes you to sweat) each week. In addition, most adults need muscle-strengthening exercises on 2 or more days a week.  Silver Sneakers may be a benefit available to you. To determine eligibility, you may visit the website: www.silversneakers.com or contact program at [phone] Mon-Fri between 8AM-8PM.   . Maintain a healthy weight. The body mass index (BMI) is a screening tool to identify possible weight problems. It provides an estimate of body fat based on height and weight. Your health care provider can find your BMI and can help you achieve or maintain a healthy weight.   For adults 20 years and older: ? A BMI below 18.5 is considered underweight. ? A BMI of 18.5 to 24.9 is normal. ? A BMI of 25 to 29.9 is considered overweight. ? A BMI of 30 and above is considered obese.   . Maintain normal blood lipids and cholesterol levels by exercising and minimizing your intake of saturated fat. Eat a balanced diet with plenty of fruit and vegetables. Blood tests for lipids and cholesterol should begin at age 85 and be repeated every 5 years. If your lipid or cholesterol levels are high, you are over 50, or you are at high risk for heart disease, you may need your cholesterol levels checked more frequently. Ongoing high lipid and cholesterol levels should be treated with medicines if diet and exercise are not working.  Marland Kitchen  If you smoke, find out from your health care provider how to quit. If you do not use tobacco, please do not start.  . If you choose to drink alcohol, please do not consume more than 2 drinks per day. One drink is considered to be 12 ounces (355 mL) of beer, 5 ounces (148 mL) of wine, or 1.5 ounces (44 mL) of liquor.  . If you are 66-15 years old, ask your health care provider if you should take aspirin to prevent strokes.  . Use sunscreen. Apply sunscreen liberally and repeatedly throughout the day. You  should seek shade when your shadow is shorter than you. Protect yourself by wearing long sleeves, pants, a wide-brimmed hat, and sunglasses year round, whenever you are outdoors.  . Once a month, do a whole body skin exam, using a mirror to look at the skin on your back. Tell your health care provider of new moles, moles that have irregular borders, moles that are larger than a pencil eraser, or moles that have changed in shape or color.

## 2018-03-16 NOTE — Progress Notes (Signed)
Subjective:   Elijah [name] is a 77 y.o. male who presents for Medicare Annual/Subsequent preventive examination.  Review of Systems:  No ROS.  Medicare Wellness Visit. Additional risk factors are reflected in the social history. Cardiac Risk Factors include: advanced age (>66men, >15 women);male gender Patient currently lives in a 2 story town home with his wife. They have no pets. He works [employer] consultant. Occasionally reads. Still works some.     Patient goes to bed at 8pm. Gets up about once a night to go to the bathroom. Gets up around 4:30-5am. Feels rested most of the time when he gets up. Objective:    Vitals: BP 122/76 (BP Location: Left Arm, Patient Position: Sitting, Cuff Size: Large)   Pulse (!) 50   Ht 6\' 2"  (1.88 m)   Wt 215 lb 9.6 oz (97.8 kg)   SpO2 99%   BMI 27.68 kg/m   Body mass index is 27.68 kg/m.  Advanced Directives 03/16/2018 05/14/2016 05/13/2016 09/15/2011 09/15/2011 09/10/2011  Does Patient Have a Medical Advance Directive? Yes Yes Yes Patient has advance directive, copy not in chart - Patient has advance directive, copy not in chart  Type of Advance Directive [place];Living will Living will;Healthcare Power of Attorney Living will;Healthcare Power of Attorney Living will - Living will;Healthcare Power of Attorney  Does patient want to make changes to medical advance directive? No - Patient declined No - Patient declined - - - -  Copy of [place] in Chart? No - copy requested No - copy requested No - copy requested Copy requested from family - -  Pre-existing out of facility DNR order (yellow form or pink MOST form) - - - No No No    Tobacco Social History   Tobacco Use  Smoking Status Former Smoker  . Last attempt to quit: 08/04/1968  . Years since quitting: 49.6  Smokeless Tobacco Never Used     Counseling given: Not Answered    Past Medical History:  Diagnosis Date  . Adenomatous colon polyp     . Arthritis 09-10-11   rt. hip, knee, hands  . Atrial fibrillation ([place]) 09-10-11   Chronic A. Fib.-controls with med and Aspirin  . Cancer ([place]) 09-10-11   dx. Prostate cancer-bx. done 12'12, surgery planned  . Cataract   . Diverticulosis   . Hemorrhoid   . Hx of adenomatous colonic polyps 06/05/2011  . Hyperlipidemia   . Hypertension   . 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 etiology  . Peripheral neuropathy   . Prostate cancer ([place])   . Stroke [facility])    Past Surgical History:  Procedure Laterality Date  . COLONOSCOPY  04/2003, 06/05/2011   diverticulosis, internal and external hemorrhoids 2004 and 2012, 2 small polyps 2012  . EYE SURGERY  09-10-11   Only stitches to close cut, and observation of hematoma from injury  . ROBOT ASSISTED LAPAROSCOPIC RADICAL PROSTATECTOMY  09/15/2011   Procedure: ROBOTIC ASSISTED LAPAROSCOPIC RADICAL PROSTATECTOMY LEVEL 2;  Surgeon: Dutch Gray, MD;  Location: [facility];  Service: Urology;  Laterality: N/A;        . TONSILLECTOMY     Family History  Problem Relation Age of Onset  . Diabetes Mother   . Hypertension Mother   . Diverticulosis Mother   . Heart disease Father 65  . Heart attack Father 77       MI  . Prostate cancer Brother   . Alzheimer's disease Paternal Grandmother   .  Heart attack Paternal Grandfather   . Colon cancer Neg Hx    Social History   Socioeconomic History  . Marital status: Married    Spouse name: Not on file  . Number of children: 3  . Years of education: Not on file  . Highest education level: Not on file  Occupational History  . Occupation: Surveyor, mining: RETIRED  Social Needs  . Financial resource strain: Not on file  . Food insecurity:    Worry: Not on file    Inability: Not on file  . Transportation needs:    Medical: Not on file    Non-medical: Not on file  Tobacco Use  . Smoking status: Former Smoker    Last attempt to quit: 08/04/1968    Years since quitting: 49.6  . Smokeless tobacco:  Never Used  Substance and Sexual Activity  . Alcohol use: No    Comment: none in 20 yrs  . Drug use: No  . Sexual activity: Yes  Lifestyle  . Physical activity:    Days per week: Not on file    Minutes per session: Not on file  . Stress: Not on file  Relationships  . Social connections:    Talks on phone: Not on file    Gets together: Not on file    Attends religious service: Not on file    Active member of club or organization: Not on file    Attends meetings of clubs or organizations: Not on file    Relationship status: Not on file  Other Topics Concern  . Not on file  Social History Narrative   Married with 3 kids. 6 grandkids.       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       Hobbies: beach (place at [employer]) usually every 2 weeks.     Outpatient Encounter Medications as of 03/16/2018  Medication Sig  . acetaminophen (TYLENOL) 500 MG tablet Take 500 mg by mouth 2 (two) times daily.   . Cholecalciferol (VITAMIN D PO) Take 1 tablet by mouth daily. 1000units daily  . metoprolol succinate (TOPROL-XL) 50 MG 24 hr tablet TAKE 1 TABLET BY MOUTH DAILY  . ranitidine (ZANTAC) 150 MG tablet Take 1 tablet (150 mg total) by mouth 2 (two) times daily. (Patient taking differently: Take 150 mg by mouth at bedtime. )  . simvastatin (ZOCOR) 20 MG tablet TAKE 1 TABLET BY MOUTH ONCE DAILY  . XARELTO 20 MG TABS tablet TAKE 1 TABLET BY MOUTH ONCE DAILY   No facility-administered encounter medications on file as of 03/16/2018.     Activities of Daily Living In your present state of health, do you have any difficulty performing the following activities: 03/16/2018  Hearing? N  Vision? N  Difficulty concentrating or making decisions? N  Walking or climbing stairs? N  Dressing or bathing? N  Doing errands, shopping? N  Preparing Food and eating ? N  Using the Toilet? N  In the past six months, have you accidently leaked urine? N  Do you have problems with  loss of bowel control? N  Managing your Medications? N  Managing your Finances? N  Housekeeping or managing your Housekeeping? N  Some recent data might be hidden    Patient Care Team: [name], MD as PCP - General (Family Medicine) Thompson Grayer, MD as PCP - Cardiology (Cardiology) Gatha Mayer, MD as Consulting Physician (Gastroenterology)   Assessment:   This is  a routine wellness examination for Elijah [name].  Exercise Activities and Dietary recommendations Current Exercise Habits: Structured exercise class, Type of exercise: strength training/weights;walking, Time (Minutes): 60, Frequency (Times/Week): 5, Weekly Exercise (Minutes/Week): 300, Intensity: Moderate, Exercise limited by: orthopedic condition(s);neurologic condition(s)   Breakfast: cereal, sweet roll, fruit, orange juice  Lunch: egg salad sandwich, baked beans, milk, water, potato chips and a few nuts  Dinner: Hot dogs, side salad, broccoli, ice water Goals   None     Fall Risk Fall Risk  03/16/2018 02/17/2018 04/15/2017 02/12/2017 02/07/2016  Falls in the past year? No No No No No  Number falls in past yr: - - - - -  Injury with Fall? - - - - -      Depression Screen PHQ 2/9 Scores 03/16/2018 02/17/2018 02/12/2017 02/07/2016  PHQ - 2 Score 0 0 0 0    Cognitive Function     6CIT Screen 03/16/2018  What Year? 0 points  What month? 0 points  What time? 0 points  Count back from 20 0 points  Months in reverse 0 points    Immunization History  Administered Date(s) Administered  . Influenza Split 05/04/2012  . Influenza, High Dose Seasonal PF 08/26/2017  . Pneumococcal Conjugate-13 02/12/2017  . Pneumococcal Polysaccharide-23 07/15/2007  . Td 04/05/2003, 12/11/2017  . Tetanus 06/05/2014     Screening Tests Health Maintenance  Topic Date Due  . COLONOSCOPY  06/02/2016  . INFLUENZA VACCINE  03/04/2018  . TETANUS/TDAP  12/12/2027  . PNA vac Low Risk Adult  Completed        Plan:   Follow Up  with PCP as advised  I have personally reviewed and noted the following in the patient's chart:   . Medical and social history . Use of alcohol, tobacco or illicit drugs  . Current medications and supplements . Functional ability and status . Nutritional status . Physical activity . Advanced directives . List of other physicians . Vitals . Screenings to include cognitive, depression, and falls . Referrals and appointments  In addition, I have reviewed and discussed with patient certain preventive protocols, quality metrics, and best practice recommendations. A written personalized care plan for preventive services as well as general preventive health recommendations were provided to patient.     [place], [place]  3/74/[id number]

## 2018-03-16 NOTE — Progress Notes (Signed)
PCP notes: Last OV 02/24/18: 4-7 month follow up per his schedule   Health maintenance: Schedule Flu shot this Fall. Colonoscopy-?   Abnormal screenings: None   Patient concerns: None   Nurse concerns:None   Next PCP appt:  09/01/18

## 2018-03-16 NOTE — Progress Notes (Signed)
I have reviewed and agree with note, evaluation, plan.   Roselyn Reef- in regards to colonoscopy he was supposed to see Dr. Carlean Purl in about a year from last year to discuss plans- please encourage patient to set this up or help him set this up.   [name], MD

## 2018-03-29 ENCOUNTER — Encounter: Payer: Self-pay | Admitting: Internal Medicine

## 2018-03-29 ENCOUNTER — Ambulatory Visit (INDEPENDENT_AMBULATORY_CARE_PROVIDER_SITE_OTHER): Payer: Medicare Other | Admitting: Internal Medicine

## 2018-03-29 VITALS — BP 136/80 | HR 52 | Ht 75.0 in | Wt 213.2 lb

## 2018-03-29 DIAGNOSIS — I10 Essential (primary) hypertension: Secondary | ICD-10-CM | POA: Diagnosis not present

## 2018-03-29 DIAGNOSIS — I482 Chronic atrial fibrillation: Secondary | ICD-10-CM

## 2018-03-29 DIAGNOSIS — I4821 Permanent atrial fibrillation: Secondary | ICD-10-CM

## 2018-03-29 NOTE — Patient Instructions (Signed)
Medication Instructions:  Your physician recommends that you continue on your current medications as directed. Please refer to the Current Medication list given to you today.  * If you need a refill on your cardiac medications before your next appointment, please call your pharmacy.   Labwork: None ordered  Testing/Procedures: None ordered  Follow-Up: Your physician wants you to follow-up in: 1 year with Tommye Standard, PA-C.  You will receive a reminder letter in the mail two months in advance. If you don't receive a letter, please call our office to schedule the follow-up appointment.   Thank you for choosing [facility]!!

## 2018-03-29 NOTE — Progress Notes (Signed)
PCP: [name], MD   Primary EP: Dr Rayann Heman  Huber Mathers is a 77 y.o. male who presents today for routine electrophysiology followup.  Since last being seen in our clinic by Tommye Standard PA-C, the patient reports doing reasonably well. He has occasional palpitations with his permanent afib.  He also has unsteadiness due to neuropathy. He snores and has fatigue.  Declines sleep study at this time.  Today, he denies symptoms of palpitations, chest pain, shortness of breath,  lower extremity edema, dizziness, presyncope, or syncope.  The patient is otherwise without complaint today.   Past Medical History:  Diagnosis Date  . Adenomatous colon polyp   . Arthritis 09-10-11   rt. hip, knee, hands  . Atrial fibrillation ([place]) 09-10-11   Chronic A. Fib.-controls with med and Aspirin  . Cancer ([place]) 09-10-11   dx. Prostate cancer-bx. done 12'12, surgery planned  . Cataract   . Diverticulosis   . Hemorrhoid   . Hx of adenomatous colonic polyps 06/05/2011  . Hyperlipidemia   . Hypertension   . 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 etiology  . Peripheral neuropathy   . Prostate cancer ([place])   . Stroke [facility])    Past Surgical History:  Procedure Laterality Date  . COLONOSCOPY  04/2003, 06/05/2011   diverticulosis, internal and external hemorrhoids 2004 and 2012, 2 small polyps 2012  . EYE SURGERY  09-10-11   Only stitches to close cut, and observation of hematoma from injury  . ROBOT ASSISTED LAPAROSCOPIC RADICAL PROSTATECTOMY  09/15/2011   Procedure: ROBOTIC ASSISTED LAPAROSCOPIC RADICAL PROSTATECTOMY LEVEL 2;  Surgeon: Dutch Gray, MD;  Location: [facility];  Service: Urology;  Laterality: N/A;        . TONSILLECTOMY      ROS- all systems are reviewed and negatives except as per HPI above  Current Outpatient Medications  Medication Sig Dispense Refill  . acetaminophen (TYLENOL) 500 MG tablet Take 500 mg by mouth 2 (two) times daily.     . Cholecalciferol (VITAMIN D PO) Take 1 tablet by  mouth 2 (two) times daily. 1000units daily    . metoprolol succinate (TOPROL-XL) 50 MG 24 hr tablet TAKE 1 TABLET BY MOUTH DAILY 90 tablet 1  . ranitidine (ZANTAC) 150 MG tablet Take 150 mg by mouth daily.    . simvastatin (ZOCOR) 20 MG tablet TAKE 1 TABLET BY MOUTH ONCE DAILY 90 tablet 1  . XARELTO 20 MG TABS tablet TAKE 1 TABLET BY MOUTH ONCE DAILY 90 tablet 1   No current facility-administered medications for this visit.     Physical Exam: Vitals:   03/29/18 1010  BP: 136/80  Pulse: (!) 52  SpO2: 98%  Weight: 213 lb 3.2 oz (96.7 kg)  Height: 6\' 3"  (1.905 m)    GEN- The patient is well appearing, alert and oriented x 3 today.   Head- normocephalic, atraumatic Eyes-  Sclera clear, conjunctiva pink Ears- hearing intact Oropharynx- clear Lungs- Clear to ausculation bilaterally, normal work of breathing Heart- irregular rate and rhythm, no murmurs, rubs or gallops, PMI not laterally displaced GI- soft, NT, ND, + BS Extremities- no clubbing, cyanosis, or edema  Wt Readings from Last 3 Encounters:  03/29/18 213 lb 3.2 oz (96.7 kg)  03/16/18 215 lb 9.6 oz (97.8 kg)  02/24/18 209 lb 3.2 oz (94.9 kg)    EKG tracing ordered today is personally reviewed and shows afib, V rate 52 bpm, RBBB  Assessment and Plan:  1. Persistent atrial  fibrillation Rate controlled.  I have offered that he could try reducing toprol to 25mg  daily due to fatigue.  He is currently reluctant to make changes. chads2vasc score is 4.  On xarelto (CrCl 54)  2. HTN Stable No change required today  3. Snoring/ fatigue He snores and has fatigue.  Declines sleep study at this time.   Return annually to see EP PA.  I will see as needed.  Thompson Grayer MD, [facility] 03/29/2018 10:22 AM

## 2018-03-30 ENCOUNTER — Other Ambulatory Visit: Payer: Self-pay | Admitting: Family Medicine

## 2018-03-31 ENCOUNTER — Encounter: Payer: Self-pay | Admitting: Family Medicine

## 2018-05-19 DIAGNOSIS — Z08 Encounter for follow-up examination after completed treatment for malignant neoplasm: Secondary | ICD-10-CM | POA: Diagnosis not present

## 2018-05-19 DIAGNOSIS — Z85820 Personal history of malignant melanoma of skin: Secondary | ICD-10-CM | POA: Diagnosis not present

## 2018-05-19 DIAGNOSIS — L570 Actinic keratosis: Secondary | ICD-10-CM | POA: Diagnosis not present

## 2018-05-19 DIAGNOSIS — X32XXXD Exposure to sunlight, subsequent encounter: Secondary | ICD-10-CM | POA: Diagnosis not present

## 2018-05-20 ENCOUNTER — Encounter: Payer: Self-pay | Admitting: Family Medicine

## 2018-05-31 ENCOUNTER — Encounter: Payer: Self-pay | Admitting: Internal Medicine

## 2018-06-04 ENCOUNTER — Ambulatory Visit (INDEPENDENT_AMBULATORY_CARE_PROVIDER_SITE_OTHER): Payer: Medicare Other | Admitting: Family Medicine

## 2018-06-04 ENCOUNTER — Encounter: Payer: Self-pay | Admitting: Family Medicine

## 2018-06-04 VITALS — BP 136/72 | HR 77 | Temp 98.1°F | Ht 75.0 in | Wt 214.4 lb

## 2018-06-04 DIAGNOSIS — Z6826 Body mass index (BMI) 26.0-26.9, adult: Secondary | ICD-10-CM

## 2018-06-04 DIAGNOSIS — I10 Essential (primary) hypertension: Secondary | ICD-10-CM | POA: Diagnosis not present

## 2018-06-04 DIAGNOSIS — Z23 Encounter for immunization: Secondary | ICD-10-CM | POA: Diagnosis not present

## 2018-06-04 DIAGNOSIS — K219 Gastro-esophageal reflux disease without esophagitis: Secondary | ICD-10-CM

## 2018-06-04 MED ORDER — FAMOTIDINE 20 MG PO TABS: 20.0000 mg | ORAL_TABLET | Freq: Two times a day (BID) | ORAL | 3 refills | Status: DC

## 2018-06-04 MED ORDER — METOPROLOL SUCCINATE ER 25 MG PO TB24
25.0000 mg | ORAL_TABLET | Freq: Every day | ORAL | 3 refills | Status: DC
Start: 2018-06-04 — End: 2019-05-25

## 2018-06-04 NOTE — Assessment & Plan Note (Signed)
S: controlled on  toprol 25 mg down from 50mg  per cardiology BP Readings from Last 3 Encounters:  06/04/18 136/72  03/29/18 136/80  03/16/18 122/76  A/P: We discussed blood pressure goal of <140/90. Continue current meds:  Sent in 25mg  dose so he doesn't have to chronically split the 50mg  pills per his preference

## 2018-06-04 NOTE — Assessment & Plan Note (Signed)
S:  He has been taking ranitidine 150mg  twice a day and had good relief of regular cough. Dropped back to 1 a day due to recall and cough worsened. Went back to 2 a day and symptoms improved again A/P:  Reasonably stable symptoms- due to recall will change to pepcid 20mg  BID.

## 2018-06-04 NOTE — Patient Instructions (Signed)
Stop zantac. Start pepcid 20mg  twice a day before meals.   Blood pressure looks good on the toprol 25mg  XR- I sent in a refill of this so you dont have to split it when you finish current medicine. Heart rate looks better too

## 2018-06-04 NOTE — Progress Notes (Signed)
Subjective:  Elijah [name] is a 77 y.o. year old very pleasant male patient who presents for/with See problem oriented charting ROS- No chest pain or shortness of breath. No headache or blurry vision.    Past Medical History-  Patient Active Problem List   Diagnosis Date Noted  . History of CVA (cerebrovascular accident) 05/14/2016    Priority: High  . History of melanoma 12/04/2015    Priority: High  . Atrial fibrillation ([place]) 04/06/2007    Priority: High  . Hyperglycemia 08/13/2016    Priority: Medium  . Thyroid nodule     Priority: Medium  . GERD (gastroesophageal reflux disease) 12/04/2015    Priority: Medium  . CKD (chronic kidney disease), stage III ([place]) 12/07/2014    Priority: Medium  . History of prostate cancer 09/23/2012    Priority: Medium  . 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 Essential hypertension 04/07/2007    Priority: Medium  . Hyperlipidemia 04/06/2007    Priority: Medium  . Gross hematuria 02/12/2017    Priority: Low  . Osteoarthritis of left hip 08/09/2014    Priority: Low  . Actinic keratosis 06/05/2014    Priority: Low  . Double vision 12/14/2017  . Hx of adenomatous colonic polyps 06/05/2011    Medications- reviewed and updated Current Outpatient Medications  Medication Sig Dispense Refill  . acetaminophen (TYLENOL) 500 MG tablet Take 500 mg by mouth 2 (two) times daily.     . Cholecalciferol (VITAMIN D PO) Take 1 tablet by mouth 2 (two) times daily. 1000units daily    . metoprolol succinate (TOPROL-XL) 25 MG 24 hr tablet Take 1 tablet (25 mg total) by mouth daily. Take with or immediately following a meal. 90 tablet 3  . simvastatin (ZOCOR) 20 MG tablet TAKE 1 TABLET BY MOUTH ONCE DAILY 90 tablet 1  . XARELTO 20 MG TABS tablet TAKE 1 TABLET BY MOUTH ONCE DAILY 90 tablet 1  . famotidine (PEPCID) 20 MG tablet Take 1 tablet (20 mg total) by mouth 2 (two) times daily. 180 tablet 3   No current  facility-administered medications for this visit.     Objective: BP 136/72   Pulse 77   Temp 98.1 F (36.7 C) (Oral)   Ht 6\' 3"  (1.905 m)   Wt 214 lb 6.4 oz (97.3 kg)   SpO2 98%   BMI 26.80 kg/m  Gen: NAD, resting comfortably CV: irregularly irregular, no murmurs rubs or gallops Lungs: CTAB no crackles, wheeze, rhonchi Abdomen: soft/nontender/nondistended/normal bowel sounds. No rebound or guarding.  Ext: 1+ edema Skin: warm, dry  Assessment/Plan:  GERD (gastroesophageal reflux disease) S:  He has been taking ranitidine 150mg  twice a day and had good relief of regular cough. Dropped back to 1 a day due to recall and cough worsened. Went back to 2 a day and symptoms improved again A/P:  Reasonably stable symptoms- due to recall will change to pepcid 20mg  BID.   Essential hypertension S: controlled on  toprol 25 mg down from 50mg  per cardiology BP Readings from Last 3 Encounters:  06/04/18 136/72  03/29/18 136/80  03/16/18 122/76  A/P: We discussed blood pressure goal of <140/90. Continue current meds:  Sent in 25mg  dose so he doesn't have to chronically split the 50mg  pills per his preference   Future Appointments  Date Time Provider [place]  09/01/2018  8:40 AM [name], MD [facility]  11/24/2018  1:00 PM Elayne Snare, MD [facility] None  03/23/2019  4:00 PM [facility] HEALTH COACH [facility]    Lab/Order associations: Need for influenza vaccination - Plan: Flu vaccine HIGH DOSE PF  Gastroesophageal reflux disease without esophagitis  Essential hypertension  Meds ordered this encounter  Medications  . famotidine (PEPCID) 20 MG tablet    Sig: Take 1 tablet (20 mg total) by mouth 2 (two) times daily.    Dispense:  180 tablet    Refill:  3  . metoprolol succinate (TOPROL-XL) 25 MG 24 hr tablet    Sig: Take 1 tablet (25 mg total) by mouth daily. Take with or immediately following a meal.    Dispense:  90 tablet    Refill:  3    Return  precautions advised.  [name], MD

## 2018-06-23 DIAGNOSIS — H524 Presbyopia: Secondary | ICD-10-CM | POA: Diagnosis not present

## 2018-06-23 DIAGNOSIS — Z961 Presence of intraocular lens: Secondary | ICD-10-CM | POA: Diagnosis not present

## 2018-06-23 DIAGNOSIS — H5201 Hypermetropia, right eye: Secondary | ICD-10-CM | POA: Diagnosis not present

## 2018-06-23 DIAGNOSIS — H52223 Regular astigmatism, bilateral: Secondary | ICD-10-CM | POA: Diagnosis not present

## 2018-06-23 DIAGNOSIS — Z9849 Cataract extraction status, unspecified eye: Secondary | ICD-10-CM | POA: Diagnosis not present

## 2018-06-23 DIAGNOSIS — H04123 Dry eye syndrome of bilateral lacrimal glands: Secondary | ICD-10-CM | POA: Diagnosis not present

## 2018-06-23 DIAGNOSIS — H5371 Glare sensitivity: Secondary | ICD-10-CM | POA: Diagnosis not present

## 2018-08-11 DIAGNOSIS — Z85820 Personal history of malignant melanoma of skin: Secondary | ICD-10-CM | POA: Diagnosis not present

## 2018-08-11 DIAGNOSIS — X32XXXD Exposure to sunlight, subsequent encounter: Secondary | ICD-10-CM | POA: Diagnosis not present

## 2018-08-11 DIAGNOSIS — Z1283 Encounter for screening for malignant neoplasm of skin: Secondary | ICD-10-CM | POA: Diagnosis not present

## 2018-08-11 DIAGNOSIS — L570 Actinic keratosis: Secondary | ICD-10-CM | POA: Diagnosis not present

## 2018-08-11 DIAGNOSIS — L820 Inflamed seborrheic keratosis: Secondary | ICD-10-CM | POA: Diagnosis not present

## 2018-08-11 DIAGNOSIS — D225 Melanocytic nevi of trunk: Secondary | ICD-10-CM | POA: Diagnosis not present

## 2018-08-11 DIAGNOSIS — Z08 Encounter for follow-up examination after completed treatment for malignant neoplasm: Secondary | ICD-10-CM | POA: Diagnosis not present

## 2018-08-18 DIAGNOSIS — C61 Malignant neoplasm of prostate: Secondary | ICD-10-CM | POA: Diagnosis not present

## 2018-08-18 LAB — PSA: PSA: <0.015

## 2018-08-25 DIAGNOSIS — C61 Malignant neoplasm of prostate: Secondary | ICD-10-CM | POA: Diagnosis not present

## 2018-08-25 DIAGNOSIS — N393 Stress incontinence (female) (male): Secondary | ICD-10-CM | POA: Diagnosis not present

## 2018-08-27 ENCOUNTER — Encounter: Payer: Self-pay | Admitting: Family Medicine

## 2018-09-01 ENCOUNTER — Other Ambulatory Visit: Payer: Self-pay | Admitting: Family Medicine

## 2018-09-01 ENCOUNTER — Ambulatory Visit (INDEPENDENT_AMBULATORY_CARE_PROVIDER_SITE_OTHER): Payer: Medicare Other | Admitting: Family Medicine

## 2018-09-01 ENCOUNTER — Encounter: Payer: Self-pay | Admitting: Family Medicine

## 2018-09-01 VITALS — BP 138/62 | HR 62 | Temp 98.0°F | Ht 75.0 in | Wt 217.6 lb

## 2018-09-01 DIAGNOSIS — E785 Hyperlipidemia, unspecified: Secondary | ICD-10-CM | POA: Diagnosis not present

## 2018-09-01 DIAGNOSIS — I482 Chronic atrial fibrillation, unspecified: Secondary | ICD-10-CM | POA: Diagnosis not present

## 2018-09-01 DIAGNOSIS — R739 Hyperglycemia, unspecified: Secondary | ICD-10-CM

## 2018-09-01 DIAGNOSIS — E041 Nontoxic single thyroid nodule: Secondary | ICD-10-CM

## 2018-09-01 DIAGNOSIS — I10 Essential (primary) hypertension: Secondary | ICD-10-CM

## 2018-09-01 DIAGNOSIS — Z6827 Body mass index (BMI) 27.0-27.9, adult: Secondary | ICD-10-CM

## 2018-09-01 DIAGNOSIS — Z1211 Encounter for screening for malignant neoplasm of colon: Secondary | ICD-10-CM

## 2018-09-01 DIAGNOSIS — R748 Abnormal levels of other serum enzymes: Secondary | ICD-10-CM | POA: Diagnosis not present

## 2018-09-01 DIAGNOSIS — R7989 Other specified abnormal findings of blood chemistry: Secondary | ICD-10-CM

## 2018-09-01 DIAGNOSIS — Z86010 Personal history of colonic polyps: Secondary | ICD-10-CM | POA: Diagnosis not present

## 2018-09-01 LAB — BASIC METABOLIC PANEL
BUN: 27 mg/dL — ABNORMAL HIGH (ref 6–23)
CHLORIDE: 105 meq/L (ref 96–112)
CO2: 27 mEq/L (ref 19–32)
Calcium: 9.5 mg/dL (ref 8.4–10.5)
Creatinine, Ser: 1.7 mg/dL — ABNORMAL HIGH (ref 0.40–1.50)
GFR: 39.26 mL/min — ABNORMAL LOW (ref >60.00)
Glucose, Bld: 88 mg/dL (ref 70–99)
POTASSIUM: 4.9 meq/L (ref 3.5–5.1)
Sodium: 138 mEq/L (ref 135–145)

## 2018-09-01 LAB — CBC
HEMATOCRIT: 46 % (ref 39.0–52.0)
HEMOGLOBIN: 15.6 g/dL (ref 13.0–17.0)
MCHC: 34 g/dL (ref 30.0–36.0)
MCV: 94.6 fl (ref 78.0–100.0)
Platelets: 203 10*3/uL (ref 150.0–400.0)
RBC: 4.86 Mil/uL (ref 4.22–5.81)
RDW: 13.4 % (ref 11.5–15.5)
WBC: 8.3 10*3/uL (ref 4.0–10.5)

## 2018-09-01 LAB — LIPID PANEL
Cholesterol: 133 mg/dL (ref 0–200)
HDL: 33.3 mg/dL — ABNORMAL LOW (ref >39.00)
LDL Cholesterol: 68 mg/dL (ref 0–99)
NonHDL: 99.25
Total CHOL/HDL Ratio: 4
Triglycerides: 158 mg/dL — ABNORMAL HIGH (ref 0.0–149.0)
VLDL: 31.6 mg/dL (ref 0.0–40.0)

## 2018-09-01 LAB — HEMOGLOBIN A1C: Hgb A1c MFr Bld: 5.9 % (ref 4.6–6.5)

## 2018-09-01 LAB — TSH: TSH: 1.23 u[IU]/mL (ref 0.35–4.50)

## 2018-09-01 NOTE — Patient Instructions (Addendum)
Health Maintenance Due  Topic Date Due  . COLONOSCOPY - you plan to sit down to discuss with Dr. Carlean Purl 06/02/2016   No changes today unless labs lead [place] to make changes   Please stop by lab before you go If you do not have mychart- we will call you about results within 5 business days of [place] receiving them.  If you have mychart- we will send your results within 3 business days of [place] receiving them.  If abnormal or we want to clarify a result, we will call or mychart you to make sure you receive the message.  If you have questions or concerns or don't hear within 5-7 days, please send [place] a message or call us.

## 2018-09-01 NOTE — Addendum Note (Signed)
Addended by: Gwenyth Ober R on: 09/01/2018 03:50 PM   Modules accepted: Orders

## 2018-09-01 NOTE — Telephone Encounter (Signed)
Last OV 06/04/2018 Last refill 03/01/2018 #90/1 Next OV not scheduled.

## 2018-09-01 NOTE — Progress Notes (Signed)
Phone [phone]   Subjective:  Elijah [name] is a 78 y.o. year old very pleasant male patient who presents for/with See problem oriented charting ROS- No chest pain or shortness of breath. No headache or blurry vision.     BMI monitoring- elevated BMI noted: Body mass index is 27.2 kg/m. Encouraged need for healthy eating, regular exercise, weight loss.  [employer] daily when in town, trying to eat healthy diet BMI Metric Follow Up - 09/01/18 0846      BMI Metric Follow Up-Please document annually   BMI Metric Follow Up  Education provided       Past Medical History-  Patient Active Problem List   Diagnosis Date Noted  . History of CVA (cerebrovascular accident) 05/14/2016    Priority: High  . History of melanoma 12/04/2015    Priority: High  . Atrial fibrillation ([place]) 04/06/2007    Priority: High  . Hyperglycemia 08/13/2016    Priority: Medium  . Thyroid nodule     Priority: Medium  . GERD (gastroesophageal reflux disease) 12/04/2015    Priority: Medium  . CKD (chronic kidney disease), stage III ([place]) 12/07/2014    Priority: Medium  . History of prostate cancer 09/23/2012    Priority: Medium  . 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 Essential hypertension 04/07/2007    Priority: Medium  . Hyperlipidemia 04/06/2007    Priority: Medium  . Gross hematuria 02/12/2017    Priority: Low  . Osteoarthritis of left hip 08/09/2014    Priority: Low  . Actinic keratosis 06/05/2014    Priority: Low  . Double vision 12/14/2017  . Hx of adenomatous colonic polyps 06/05/2011    Medications- reviewed and updated Current Outpatient Medications  Medication Sig Dispense Refill  . acetaminophen (TYLENOL) 500 MG tablet Take 500 mg by mouth 2 (two) times daily.     . Cholecalciferol (VITAMIN D PO) Take 1 tablet by mouth 2 (two) times daily. 1000units daily    . famotidine (PEPCID) 20 MG tablet Take 1 tablet (20 mg total) by mouth 2 (two) times  daily. 180 tablet 3  . metoprolol succinate (TOPROL-XL) 25 MG 24 hr tablet Take 1 tablet (25 mg total) by mouth daily. Take with or immediately following a meal. 90 tablet 3  . simvastatin (ZOCOR) 20 MG tablet TAKE 1 TABLET BY MOUTH ONCE DAILY 90 tablet 1  . XARELTO 20 MG TABS tablet TAKE 1 TABLET BY MOUTH ONCE DAILY 90 tablet 1   No current facility-administered medications for this visit.      Objective:  BP 138/62 (BP Location: Right Arm, Patient Position: Sitting, Cuff Size: Large)   Temp 98 F (36.7 C) (Oral)   Ht 6\' 3"  (1.905 m)   Wt 217 lb 9.6 oz (98.7 kg)   BMI 27.20 kg/m  Gen: NAD, resting comfortably CV: irregularly irregular no murmurs rubs or gallops Lungs: CTAB no crackles, wheeze, rhonchi Abdomen: soft/nontender/nondistended/overweight Ext: no edema Skin: warm, dry Neuro: speech normal    Assessment and Plan   #Atrial fibrillation S: Patient compliant with metoprolol 25 mg extended release for rate control.  Also uses Xarelto 20 mg for anticoagulation. A/P: Stable. Continue current medications.   -Have to monitor GFR and if consistently below 50 may reduce Xarelto to 15 mg    #Hyperlipidemia S: Compliant with simvastatin 20 mg.  LDL most recently at goal under 70 A/P: Stable. Continue current medications. Likely repeat labs in 6 months.       #  Hypertension S: Compliant with metoprolol though primarily for rate control A/P: high normal today but controlled- continue current meds     #CKD stage III S: GFR has been in the 50s.  Patient knows to avoid NSAIDs-also particularly with anticoagulation.  Would prefer H2 blockers over PPI A/P: hopefully stable- update BMP today   % continued neuropathy- not on treatment and declines treatment. Issues seem to be going into the hands  % Update TSH with labs due to prior thyroid nodule though had benign biopsy in 2018. Follows with Dr. Dwyane Dee  % Hyperglycemia- has had elevated A1c in the past and will update this with  labs today  % Colon cancer screening-fearful of stroke off of anticoagulation.  Dr. Carlean Purl has suggested 5 to 10-year follow-up from 2012 colonoscopy-may need to sit down to discuss prior to  2022 does have history of adenomatous polyps though diminutive  No problem-specific Assessment & Plan notes found for this encounter.   Future Appointments  Date Time Provider [place]  11/24/2018  1:00 PM Elayne Snare, MD [facility] None  03/23/2019  4:00 PM [facility] HEALTH COACH [facility]   No follow-ups on file.  Lab/Order associations: BMI 27.0-27.9,adult  Essential hypertension  Chronic atrial fibrillation, Chronic  No orders of the defined types were placed in this encounter.   Return precautions advised.  [name], MD

## 2018-09-13 ENCOUNTER — Other Ambulatory Visit: Payer: Self-pay | Admitting: Family Medicine

## 2018-09-15 ENCOUNTER — Other Ambulatory Visit (INDEPENDENT_AMBULATORY_CARE_PROVIDER_SITE_OTHER): Payer: Medicare Other

## 2018-09-15 ENCOUNTER — Ambulatory Visit: Payer: Medicare Other

## 2018-09-15 DIAGNOSIS — R7989 Other specified abnormal findings of blood chemistry: Secondary | ICD-10-CM | POA: Diagnosis not present

## 2018-09-15 LAB — BASIC METABOLIC PANEL
BUN: 26 mg/dL — ABNORMAL HIGH (ref 6–23)
CO2: 27 mEq/L (ref 19–32)
Calcium: 9.1 mg/dL (ref 8.4–10.5)
Chloride: 105 mEq/L (ref 96–112)
Creatinine, Ser: 1.52 mg/dL — ABNORMAL HIGH (ref 0.40–1.50)
GFR: 44.67 mL/min — ABNORMAL LOW (ref >60.00)
Glucose, Bld: 92 mg/dL (ref 70–99)
POTASSIUM: 4.2 meq/L (ref 3.5–5.1)
Sodium: 139 mEq/L (ref 135–145)

## 2018-09-17 ENCOUNTER — Telehealth: Payer: Self-pay | Admitting: Family Medicine

## 2018-09-17 NOTE — Telephone Encounter (Signed)
See note  Copied from [place] [id number]. Topic: General - Other >> Sep 17, 2018  9:53 AM Antonieta Iba C wrote: Reason for CRM: pt is calling in to speak back with [facility] [phone]

## 2018-09-17 NOTE — Telephone Encounter (Signed)
Pt stated that I called him regarding labs. Ive already spoke to pt as he agreed. Not sure if pt viewed vm before I spoke with him. No further action is needed.

## 2018-09-21 ENCOUNTER — Encounter: Payer: Self-pay | Admitting: Family Medicine

## 2018-09-21 DIAGNOSIS — N183 Chronic kidney disease, stage 3 unspecified: Secondary | ICD-10-CM

## 2018-09-21 DIAGNOSIS — E1022 Type 1 diabetes mellitus with diabetic chronic kidney disease: Secondary | ICD-10-CM

## 2018-09-24 NOTE — Telephone Encounter (Signed)
Can you please schedule pt for labs in May?

## 2018-09-26 ENCOUNTER — Encounter: Payer: Self-pay | Admitting: Family Medicine

## 2018-09-27 ENCOUNTER — Other Ambulatory Visit: Payer: Self-pay

## 2018-09-27 DIAGNOSIS — N183 Chronic kidney disease, stage 3 unspecified: Secondary | ICD-10-CM

## 2018-10-19 ENCOUNTER — Telehealth: Payer: Self-pay | Admitting: *Deleted

## 2018-10-19 ENCOUNTER — Ambulatory Visit (INDEPENDENT_AMBULATORY_CARE_PROVIDER_SITE_OTHER): Payer: Medicare Other

## 2018-10-19 ENCOUNTER — Encounter: Payer: Self-pay | Admitting: Physician Assistant

## 2018-10-19 ENCOUNTER — Ambulatory Visit (INDEPENDENT_AMBULATORY_CARE_PROVIDER_SITE_OTHER): Payer: Medicare Other | Admitting: Physician Assistant

## 2018-10-19 ENCOUNTER — Other Ambulatory Visit: Payer: Self-pay

## 2018-10-19 VITALS — HR 87 | Temp 101.2°F

## 2018-10-19 DIAGNOSIS — R05 Cough: Secondary | ICD-10-CM | POA: Diagnosis not present

## 2018-10-19 DIAGNOSIS — J984 Other disorders of lung: Secondary | ICD-10-CM | POA: Diagnosis not present

## 2018-10-19 DIAGNOSIS — R059 Cough, unspecified: Secondary | ICD-10-CM

## 2018-10-19 MED ORDER — DOXYCYCLINE HYCLATE 100 MG PO TABS: 100.0000 mg | ORAL_TABLET | Freq: Two times a day (BID) | ORAL | 0 refills | Status: DC

## 2018-10-19 NOTE — Progress Notes (Signed)
ACUTE VISIT, COVID-19  Subjective:   HPI: Patient is in today for symptoms concerning for COVID-19. Please see prescreen note for history and ROS.   Patient Active Problem List   Diagnosis Date Noted  . Double vision 12/14/2017  . Gross hematuria 02/12/2017  . Hyperglycemia 08/13/2016  . History of CVA (cerebrovascular accident) 05/14/2016  . Thyroid nodule   . History of melanoma 12/04/2015  . GERD (gastroesophageal reflux disease) 12/04/2015  . CKD (chronic kidney disease), stage III ([place]) 12/07/2014  . Osteoarthritis of left hip 08/09/2014  . Actinic keratosis 06/05/2014  . History of prostate cancer 09/23/2012  . Hx of adenomatous colonic polyps 06/05/2011  . 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  . Essential hypertension 04/07/2007  . Hyperlipidemia 04/06/2007  . Atrial fibrillation ([place]) 04/06/2007   Social History   Tobacco Use  . Smoking status: Former Smoker    Last attempt to quit: 08/04/1968    Years since quitting: 50.2  . Smokeless tobacco: Never Used  Substance Use Topics  . Alcohol use: No    Comment: none in 20 yrs    Current Outpatient Medications:  .  acetaminophen (TYLENOL) 500 MG tablet, Take 500 mg by mouth 2 (two) times daily. , Disp: , Rfl:  .  Cholecalciferol (VITAMIN D PO), Take 1 tablet by mouth 2 (two) times daily. 1000units daily, Disp: , Rfl:  .  doxycycline (VIBRA-TABS) 100 MG tablet, Take 1 tablet (100 mg total) by mouth 2 (two) times daily., Disp: 20 tablet, Rfl: 0 .  famotidine (PEPCID) 20 MG tablet, Take 1 tablet (20 mg total) by mouth 2 (two) times daily., Disp: 180 tablet, Rfl: 3 .  metoprolol succinate (TOPROL-XL) 25 MG 24 hr tablet, Take 1 tablet (25 mg total) by mouth daily. Take with or immediately following a meal., Disp: 90 tablet, Rfl: 3 .  simvastatin (ZOCOR) 20 MG tablet, TAKE 1 TABLET BY MOUTH ONCE DAILY, Disp: 90 tablet, Rfl: 1 .  XARELTO 20 MG TABS tablet, TAKE 1 TABLET BY MOUTH ONCE DAILY, Disp: 90  tablet, Rfl: 0 Allergies  Allergen Reactions  . Ciprofloxacin Nausea Only  . Flagyl [Metronidazole] Nausea Only    Objective:    Pulse 87, temperature (!) 101.2 F (38.4 C), SpO2 95 %.  General appearance: alert, cooperative, and no distress. Head: normocephalic, without obvious abnormality, atraumatic. Eyes: conjunctivae/corneas clear. PERRL, EOM's intact. Ears: normal TM's and external ear canals both ears. Nose: Nares normal. Septum midline. Mucosa normal. No drainage or sinus tenderness., clear discharge. Throat: lips, mucosa, and tongue normal; teeth and gums normal. Lungs: ronchi and scattered rales in bilateral lower lungs. Heart: irregular rate and rhythm, no murmur appreciated. Extremities: extremities normal, atraumatic, no cyanosis or edema.    Assessment & Plan:   1. Cough     Patient has a mild respiratory illness without signs of acute distress or respiratory compromise at this time. This is likely a viral infection, which can come from a number of respiratory viruses. They have not had immediate known contact with a suspected or proven COVID-19 case. COVID-19 testing performed today. Based on chest xray, will treat with doxycycline for early PNA. Quarantine until swab has returned.  Orders Placed This Encounter  Procedures  . Coronavirus CoVID-19 ([facility])  . DG Chest 2 View   Meds ordered this encounter  Medications  . doxycycline (VIBRA-TABS) 100 MG tablet    Sig: Take 1 tablet (100 mg total) by mouth 2 (two) times daily.    Dispense:  20 tablet    Refill:  0    Order Specific Question:   Supervising Provider    Answer:   Juleen China, ERICA [[id number]]    . Reviewed expectations re: course of current medical issues. . Discussed self-management of symptoms. . Outlined signs and symptoms indicating need for more acute intervention. . Patient verbalized understanding and all questions were answered. . See orders for this visit as documented in the electronic  medical record. . Patient received an After Visit Summary.   Patient has a HIGH level of medical complexity due to number of diagnoses/treatment options and amount/complexity of data reviewed.   I spent 40 minutes with this patient, greater than 50% was face-to-face time counseling regarding the above diagnoses.    [place], [place] 10/19/2018

## 2018-10-19 NOTE — Patient Instructions (Signed)
It was great to see you!  Start doxycycline antibiotic.  Quarantine until you have been notified about your test.  Any worsening symptoms --> go to the ER.  Push fluids and get plenty of rest. Please return if you are not improving as expected, or if you have high fevers (>101.5) or difficulty swallowing or worsening productive cough.  Call clinic with questions.  I hope you start feeling better soon!

## 2018-10-19 NOTE — Telephone Encounter (Signed)
Questions for Screening COVID-19  Symptom onset: Started a week ago  Travel or Contacts: Pt was in [facility]  During this illness, did/does the patient experience any of the following symptoms? Fever >100.58F [x]   Yes []   No []   Unknown Subjective fever (felt feverish) [x]   Yes []   No []   Unknown Chills [x]   Yes []   No []   Unknown Muscle aches (myalgia) [x]   Yes []   No []   Unknown Runny nose (rhinorrhea) [x]   Yes []   No []   Unknown Sore throat [x]   Yes []   No []   Unknown Cough (new onset or worsening of chronic cough) [x]   Yes []   No []   Unknown Shortness of breath (dyspnea) []   Yes [x]   No []   Unknown Nausea or vomiting []   Yes [x]   No []   Unknown Headache []   Yes []   No [x]   Unknown Abdominal pain  []   Yes [x]   No []   Unknown Diarrhea (?3 loose/looser than normal stools/24hr period) []   Yes [x]   No []   Unknown Other, specify:_____________________________________________   Patient risk factors: Smoker? []   Current [x]   Former []   Never If male, currently pregnant? []   Yes [x]   No  Patient Active Problem List   Diagnosis Date Noted  . Double vision 12/14/2017  . Gross hematuria 02/12/2017  . Hyperglycemia 08/13/2016  . History of CVA (cerebrovascular accident) 05/14/2016  . Thyroid nodule   . History of melanoma 12/04/2015  . GERD (gastroesophageal reflux disease) 12/04/2015  . CKD (chronic kidney disease), stage III ([place]) 12/07/2014  . Osteoarthritis of left hip 08/09/2014  . Actinic keratosis 06/05/2014  . History of prostate cancer 09/23/2012  . Hx of adenomatous colonic polyps 06/05/2011  . 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  . Essential hypertension 04/07/2007  . Hyperlipidemia 04/06/2007  . Atrial fibrillation ([place]) 04/06/2007    Plan:  []   High risk for COVID-19 with red flags go to ED (with CP, SOB, weak/lightheaded, or fever > 101.5). Call ahead.  [x]   High risk for COVID-19 but stable will have car visit. Inform  provider and coordinate time. Will be completed in afternoon. []   No red flags but URI signs or symptoms will go through side door and be seen in dedicated room.  Note: Referral to telemedicine is an appropriate alternative disposition for higher risk but stable. [facility] Tele[facility]/e-Visit: [phone].

## 2018-10-22 ENCOUNTER — Encounter: Payer: Self-pay | Admitting: Physician Assistant

## 2018-10-22 NOTE — Telephone Encounter (Signed)
See note  Copied from [place] [id number]. Topic: General - Other >> Oct 22, 2018 12:04 PM Bea Graff, NT wrote: Reason for CRM: Pt was tested on Tuesday for COVID-19 and is wanting to know if his result comes back over the weekend, would he get a call this weekend or have to wait until Monday to know hs result? Please advise pt.

## 2018-10-25 NOTE — Telephone Encounter (Signed)
Spoke to pt, told him we don not have test results back yet, as soon as we receive them will contact you. Pt verbalized understanding and said he is anxious not knowing. Told pt I understand. Asked pt if any fever and if cough is better? Pt said no fever and cough is a little better, coughed up a lot of stuff last week. Told pt okay will be in touch. Pt verbalized understanding.

## 2018-10-26 ENCOUNTER — Telehealth: Payer: Self-pay | Admitting: Family Medicine

## 2018-10-26 NOTE — Telephone Encounter (Signed)
Copied from [place] [id number]. Topic: Quick Communication - See Telephone Encounter >> Oct 26, 2018 12:47 PM [name] L wrote: CRM for notification. See Telephone encounter for: 10/26/18. Patient would like a call back from Dr. [name] to discuss his appointment with Morene Rankins and other general concerns.

## 2018-10-27 LAB — SARS-COV-2 RNA, QUALITATIVE REAL-TIME RT-PCR: SARS CoV 2 RNA, RT PCR: NOT DETECTED

## 2018-10-28 ENCOUNTER — Encounter: Payer: Self-pay | Admitting: Family Medicine

## 2018-10-28 NOTE — Telephone Encounter (Signed)
Please see message . Thank you .

## 2018-10-28 NOTE — Telephone Encounter (Signed)
webex or phone visit- make sure to get consent

## 2018-10-29 ENCOUNTER — Encounter: Payer: Self-pay | Admitting: Family Medicine

## 2018-10-29 NOTE — Telephone Encounter (Signed)
Advised patient to call to scheduled Webex.

## 2018-11-01 NOTE — Telephone Encounter (Signed)
Please call and schedule Webex for pt's wife.

## 2018-11-24 ENCOUNTER — Ambulatory Visit: Payer: Medicare Other | Admitting: Endocrinology

## 2018-11-24 ENCOUNTER — Other Ambulatory Visit: Payer: Self-pay | Admitting: Family Medicine

## 2018-12-01 ENCOUNTER — Other Ambulatory Visit: Payer: Self-pay

## 2018-12-01 ENCOUNTER — Other Ambulatory Visit (INDEPENDENT_AMBULATORY_CARE_PROVIDER_SITE_OTHER): Payer: Medicare Other

## 2018-12-01 DIAGNOSIS — N183 Chronic kidney disease, stage 3 unspecified: Secondary | ICD-10-CM

## 2018-12-01 LAB — BASIC METABOLIC PANEL
BUN: 24 mg/dL — ABNORMAL HIGH (ref 6–23)
CO2: 27 mEq/L (ref 19–32)
Calcium: 9.2 mg/dL (ref 8.4–10.5)
Chloride: 106 mEq/L (ref 96–112)
Creatinine, Ser: 1.4 mg/dL (ref 0.40–1.50)
GFR: 49.09 mL/min — ABNORMAL LOW (ref >60.00)
Glucose, Bld: 83 mg/dL (ref 70–99)
Potassium: 4.7 mEq/L (ref 3.5–5.1)
Sodium: 140 mEq/L (ref 135–145)

## 2018-12-02 ENCOUNTER — Telehealth: Payer: Self-pay | Admitting: Family Medicine

## 2018-12-02 NOTE — Telephone Encounter (Signed)
See note  Copied from [place] [id number]. Topic: General - Other >> Dec 02, 2018 12:14 PM Rayann Heman wrote: Reason for CRM: pt calling back. Pt states that he missed a call about labs. Please advise

## 2018-12-02 NOTE — Telephone Encounter (Signed)
This has been taking care of. No further action needed at this time!

## 2019-01-04 NOTE — Progress Notes (Signed)
Patient ID: Elijah [name], male   DOB: 05-Oct-1940, 78 y.o.   MRN: [id number]            Reason for Appointment: Follow-up of thyroid    History of Present Illness:   The patient's dominant left thyroid nodule was first discovered when he was having CT angiography for his cerebrovascular disease  The thyroid ultrasound on 05/14/16 showed the following Has a dominant nodule, location Left; Superior Size: Measures 3.7 x 2.0 x 4.3 cm, Composition: solid/almost completely solid (2) Echogenicity: isoechoic (1) Also has 6-10 nodules over 1 cm bilaterally  He was able to get a biopsy done of his dominant nodule in 11/2016 which was benign as follows Thyroid biopsy results:  LEFT UPPER POLE, FINE NEEDLE ASPIRATION (SPECIMEN 1 OF 1, COLLECTED 11/11/16): CONSISTENT WITH BENIGN FOLLICULAR NODULE (BETHESDA CATEGORY II).   RECENT history:  He was last seen in 11/2017 At that time he was having a feeling of a lump in the throat when swallowing but does not feel this time Did not feel any local pressure or difficulty swallowing  Thyroid levels have been normal as of 1/20   Lab Results  Component Value Date   FREET4 0.97 11/25/2017   FREET4 1.12 05/22/2016   TSH 1.23 09/01/2018   TSH 0.72 11/25/2017   TSH 0.98 08/13/2016   His last ultrasound was done in 11/2017 showing the following  Dominant left nodule: Maximum size: 5.1  Cm; Other 2 dimensions: 3.5 x 3.4 cm, previously, 5.3 x 3.7 x 3.5 cm  Other nodules were described as follows:  Nodule 2 is stable and meets criteria for fine needle aspiration biopsy.  Nodule 4 is stable and meets criteria for annual follow-up.  Nodule 7 is new and meets criteria for annual follow-up.  This is a 2 cm mixed cystic and solid nodule  Other nodules do not meet criteria for biopsy nor follow-up.   Allergies as of 01/05/2019      Reactions   Ciprofloxacin Nausea Only   Flagyl [metronidazole] Nausea Only      Medication List       Accurate as  of January 04, 2019  9:24 PM. If you have any questions, ask your nurse or doctor.        acetaminophen 500 MG tablet Commonly known as:  TYLENOL Take 500 mg by mouth 2 (two) times daily.   doxycycline 100 MG tablet Commonly known as:  VIBRA-TABS Take 1 tablet (100 mg total) by mouth 2 (two) times daily.   famotidine 20 MG tablet Commonly known as:  PEPCID Take 1 tablet (20 mg total) by mouth 2 (two) times daily.   metoprolol succinate 25 MG 24 hr tablet Commonly known as:  TOPROL-XL Take 1 tablet (25 mg total) by mouth daily. Take with or immediately following a meal.   simvastatin 20 MG tablet Commonly known as:  ZOCOR TAKE 1 TABLET BY MOUTH ONCE DAILY   VITAMIN D PO Take 1 tablet by mouth 2 (two) times daily. 1000units daily   Xarelto 20 MG Tabs tablet Generic drug:  rivaroxaban TAKE 1 TABLET BY MOUTH EVERY DAY       Allergies:  Allergies  Allergen Reactions  . Ciprofloxacin Nausea Only  . Flagyl [Metronidazole] Nausea Only    Past Medical History:  Diagnosis Date  . Adenomatous colon polyp   . Arthritis 09-10-11   rt. hip, knee, hands  . Cancer ([place]) 09-10-11   dx. Prostate cancer-bx. done 12'12, surgery planned  .  Cataract   . Diverticulosis   . Hemorrhoid   . Hx of adenomatous colonic polyps 06/05/2011  . Hyperlipidemia   . Hypertension   . 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 etiology  . Peripheral neuropathy   . Permanent atrial fibrillation 09/10/2011  . Prostate cancer ([place])   . Stroke [facility])     There is no history of radiation to the neck in childhood  Past Surgical History:  Procedure Laterality Date  . COLONOSCOPY  04/2003, 06/05/2011   diverticulosis, internal and external hemorrhoids 2004 and 2012, 2 small polyps 2012  . EYE SURGERY  09-10-11   Only stitches to close cut, and observation of hematoma from injury  . ROBOT ASSISTED LAPAROSCOPIC RADICAL PROSTATECTOMY  09/15/2011   Procedure: ROBOTIC ASSISTED LAPAROSCOPIC RADICAL PROSTATECTOMY  LEVEL 2;  Surgeon: Dutch Gray, MD;  Location: [facility];  Service: Urology;  Laterality: N/A;        . TONSILLECTOMY      Family History  Problem Relation Age of Onset  . Diabetes Mother   . Hypertension Mother   . Diverticulosis Mother   . Heart disease Father 83  . Heart attack Father 50       MI  . Prostate cancer Brother   . Alzheimer's disease Paternal Grandmother   . Heart attack Paternal Grandfather   . Colon cancer Neg Hx     Social History:  reports that he quit smoking about 50 years ago. He has never used smokeless tobacco. He reports that he does not drink alcohol or use drugs.   Review of Systems:  Wt Readings from Last 3 Encounters:  09/01/18 217 lb 9.6 oz (98.7 kg)  06/04/18 214 lb 6.4 oz (97.3 kg)  03/29/18 213 lb 3.2 oz (96.7 kg)    Examination:   There were no vitals taken for this visit.          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 which was evaluated with biopsy previously  Follow-up on this nodule showed no change in 2019 ultrasound  Abnormal findings on exam are only present on the right side despite the left side being much larger on ultrasound However he has multiple nodules on the right which were previously stable on ultrasound  His thyroid exam is about the same and he has no local pressure symptoms  Considering he has multiple benign looking nodules he likely has a multinodular goiter which has been longstanding and containing benign follicular nodules Some nodules are partially cystic also  Discussed current evaluation, findings and previous results With his age and stable exam do not think any further ultrasound  exams are necessary unless he has any new symptoms are his exam findings change  He has had persistently normal thyroid levels including in 1/20 He will follow-up in 1 year   Elayne Snare 01/04/2019  Note: This office note was prepared with Dragon voice recognition system technology. Any transcriptional errors that result from this process are unintentional.

## 2019-01-05 ENCOUNTER — Encounter: Payer: Self-pay | Admitting: Endocrinology

## 2019-01-05 ENCOUNTER — Other Ambulatory Visit: Payer: Self-pay

## 2019-01-05 ENCOUNTER — Ambulatory Visit (INDEPENDENT_AMBULATORY_CARE_PROVIDER_SITE_OTHER): Payer: Medicare Other | Admitting: Endocrinology

## 2019-01-05 VITALS — BP 112/68 | HR 67 | Ht 75.0 in | Wt 206.0 lb

## 2019-01-05 DIAGNOSIS — E042 Nontoxic multinodular goiter: Secondary | ICD-10-CM

## 2019-02-23 DIAGNOSIS — Z85820 Personal history of malignant melanoma of skin: Secondary | ICD-10-CM | POA: Diagnosis not present

## 2019-02-23 DIAGNOSIS — X32XXXD Exposure to sunlight, subsequent encounter: Secondary | ICD-10-CM | POA: Diagnosis not present

## 2019-02-23 DIAGNOSIS — L821 Other seborrheic keratosis: Secondary | ICD-10-CM | POA: Diagnosis not present

## 2019-02-23 DIAGNOSIS — Z1283 Encounter for screening for malignant neoplasm of skin: Secondary | ICD-10-CM | POA: Diagnosis not present

## 2019-02-23 DIAGNOSIS — Z08 Encounter for follow-up examination after completed treatment for malignant neoplasm: Secondary | ICD-10-CM | POA: Diagnosis not present

## 2019-02-23 DIAGNOSIS — L570 Actinic keratosis: Secondary | ICD-10-CM | POA: Diagnosis not present

## 2019-02-25 ENCOUNTER — Other Ambulatory Visit: Payer: Self-pay | Admitting: Family Medicine

## 2019-03-01 ENCOUNTER — Encounter: Payer: Self-pay | Admitting: Family Medicine

## 2019-03-07 ENCOUNTER — Other Ambulatory Visit: Payer: Self-pay | Admitting: Family Medicine

## 2019-03-09 ENCOUNTER — Other Ambulatory Visit: Payer: Self-pay

## 2019-03-09 ENCOUNTER — Encounter: Payer: Self-pay | Admitting: Family Medicine

## 2019-03-09 ENCOUNTER — Ambulatory Visit (INDEPENDENT_AMBULATORY_CARE_PROVIDER_SITE_OTHER): Payer: Medicare Other | Admitting: Family Medicine

## 2019-03-09 VITALS — BP 120/72 | HR 60 | Temp 98.6°F | Ht 75.0 in | Wt 209.2 lb

## 2019-03-09 DIAGNOSIS — N183 Chronic kidney disease, stage 3 unspecified: Secondary | ICD-10-CM

## 2019-03-09 DIAGNOSIS — E875 Hyperkalemia: Secondary | ICD-10-CM | POA: Diagnosis not present

## 2019-03-09 DIAGNOSIS — E663 Overweight: Secondary | ICD-10-CM | POA: Diagnosis not present

## 2019-03-09 DIAGNOSIS — Z8673 Personal history of transient ischemic attack (TIA), and cerebral infarction without residual deficits: Secondary | ICD-10-CM | POA: Diagnosis not present

## 2019-03-09 DIAGNOSIS — Z87898 Personal history of other specified conditions: Secondary | ICD-10-CM | POA: Diagnosis not present

## 2019-03-09 DIAGNOSIS — R739 Hyperglycemia, unspecified: Secondary | ICD-10-CM

## 2019-03-09 DIAGNOSIS — E785 Hyperlipidemia, unspecified: Secondary | ICD-10-CM

## 2019-03-09 DIAGNOSIS — I4821 Permanent atrial fibrillation: Secondary | ICD-10-CM | POA: Diagnosis not present

## 2019-03-09 DIAGNOSIS — I10 Essential (primary) hypertension: Secondary | ICD-10-CM

## 2019-03-09 LAB — CBC
HCT: 46.4 % (ref 39.0–52.0)
Hemoglobin: 15.9 g/dL (ref 13.0–17.0)
MCHC: 34.2 g/dL (ref 30.0–36.0)
MCV: 94.1 fl (ref 78.0–100.0)
Platelets: 184 10*3/uL (ref 150.0–400.0)
RBC: 4.93 Mil/uL (ref 4.22–5.81)
RDW: 13.6 % (ref 11.5–15.5)
WBC: 7.1 10*3/uL (ref 4.0–10.5)

## 2019-03-09 LAB — COMPREHENSIVE METABOLIC PANEL
ALT: 21 U/L (ref 0–53)
AST: 19 U/L (ref 0–37)
Albumin: 4.2 g/dL (ref 3.5–5.2)
Alkaline Phosphatase: 82 U/L (ref 39–117)
BUN: 23 mg/dL (ref 6–23)
CO2: 29 mEq/L (ref 19–32)
Calcium: 9.7 mg/dL (ref 8.4–10.5)
Chloride: 106 mEq/L (ref 96–112)
Creatinine, Ser: 1.47 mg/dL (ref 0.40–1.50)
GFR: 46.37 mL/min — ABNORMAL LOW (ref >60.00)
Glucose, Bld: 93 mg/dL (ref 70–99)
Potassium: 5.6 mEq/L — ABNORMAL HIGH (ref 3.5–5.1)
Sodium: 139 mEq/L (ref 135–145)
Total Bilirubin: 0.5 mg/dL (ref 0.2–1.2)
Total Protein: 6.5 g/dL (ref 6.0–8.3)

## 2019-03-09 LAB — LDL CHOLESTEROL, DIRECT: Direct LDL: 74 mg/dL

## 2019-03-09 LAB — HEMOGLOBIN A1C: Hgb A1c MFr Bld: 6 % (ref 4.6–6.5)

## 2019-03-09 NOTE — Patient Instructions (Addendum)
Health Maintenance Due  Topic Date Due  . COLONOSCOPY - Seeing Dr. Carlean Purl to discuss 06/02/2016  . INFLUENZA VACCINE - we should have this available in 1 month 03/05/2019   Try lamisil in the right groin for 10 days to see if that clears up issues- I do not see obvious jock itch but with continued irritation worth a trial  See if with weight loss you can do ok with famotidine just before dinner and skip morning dose- restart if needed  Please stop by lab before you go If you do not have mychart- we will call you about results within 5 business days of [place] receiving them.  If you have mychart- we will send your results within 3 business days of [place] receiving them.  If abnormal or we want to clarify a result, we will call or mychart you to make sure you receive the message.  If you have questions or concerns or don't hear within 5-7 days, please send [place] a message or call us.

## 2019-03-09 NOTE — Progress Notes (Signed)
Phone [phone]   Subjective:  Elijah [name] is a 78 y.o. year old very pleasant male patient who presents for/with See problem oriented charting Chief Complaint  Patient presents with  . Follow-up    Not fasting today.   . Hypertension  . Hyperlipidemia  . Chronic A Fib  . Gastroesophageal Reflux  . Hyperglycemia   ROS-   Denies HA, dizziness, CP, SOB, visual changes. Still with neuropathic pain. Still with some hip and back pain.   Past Medical History-  Patient Active Problem List   Diagnosis Date Noted  . History of CVA (cerebrovascular accident) 05/14/2016    Priority: High  . History of melanoma 12/04/2015    Priority: High  . Atrial fibrillation ([place]) 04/06/2007    Priority: High  . Hyperglycemia 08/13/2016    Priority: Medium  . Thyroid nodule     Priority: Medium  . GERD (gastroesophageal reflux disease) 12/04/2015    Priority: Medium  . CKD (chronic kidney disease), stage III ([place]) 12/07/2014    Priority: Medium  . History of prostate cancer 09/23/2012    Priority: Medium  . 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 Essential hypertension 04/07/2007    Priority: Medium  . Hyperlipidemia 04/06/2007    Priority: Medium  . Gross hematuria 02/12/2017    Priority: Low  . Osteoarthritis of left hip 08/09/2014    Priority: Low  . Actinic keratosis 06/05/2014    Priority: Low  . Double vision 12/14/2017  . Hx of adenomatous colonic polyps 06/05/2011    Medications- reviewed and updated Current Outpatient Medications  Medication Sig Dispense Refill  . acetaminophen (TYLENOL) 500 MG tablet Take 500 mg by mouth 2 (two) times daily.     . Cholecalciferol (VITAMIN D PO) Take 1 tablet by mouth 2 (two) times daily. 1000units daily    . famotidine (PEPCID) 20 MG tablet Take 1 tablet (20 mg total) by mouth 2 (two) times daily. 180 tablet 3  . metoprolol succinate (TOPROL-XL) 25 MG 24 hr tablet Take 1 tablet (25 mg total) by  mouth daily. Take with or immediately following a meal. 90 tablet 3  . simvastatin (ZOCOR) 20 MG tablet TAKE 1 TABLET BY MOUTH EVERY DAY 90 tablet 1  . XARELTO 20 MG TABS tablet TAKE 1 TABLET BY MOUTH EVERY DAY 90 tablet 0   No current facility-administered medications for this visit.      Objective:  BP 120/72 (BP Location: Left Arm, Patient Position: Sitting, Cuff Size: Normal)   Pulse 60   Temp 98.6 F (37 C) (Oral)   Ht 6\' 3"  (1.905 m)   Wt 209 lb 3.2 oz (94.9 kg)   BMI 26.15 kg/m  Gen: NAD, resting comfortably CV: irregularly irregular no murmurs rubs or gallops Lungs: CTAB no crackles, wheeze, rhonchi Abdomen: soft/nontender/nondistended/normal bowel sounds.  Ext: no edema Skin: warm, dry    Assessment and Plan   #hypertension/CKD stage III S: controlled on Metoprolol XL 25 mg daily. Not checking BP at home. Limiting adding salt to foods, limiting processed foods, wife makes sure he eats plenty of fruits and veggies.   GFR has ranged from 40s to 50s-lowest was high 30s. BP Readings from Last 3 Encounters:  03/09/19 120/72  01/05/19 112/68  09/01/18 138/62  A/P: Blood pressure looks excellent today-continue current medications.  Will monitor GFR given CKD stage III-continue to avoid NSAIDs.tylenol if needed  #hyperlipidemia S:  controlled on Simvastatin 20 mg daily. Tolerating well.  LDL at goal under 70 Lab Results  Component Value Date   CHOL 133 09/01/2018   HDL 33.30 (L) 09/01/2018   LDLCALC 68 09/01/2018   LDLDIRECT 69.0 02/24/2018   TRIG 158.0 (H) 09/01/2018   CHOLHDL 4 09/01/2018   A/P:  Stable. Continue current medications.    # Chronic A Fib/history of CVA-likely embolic as was not treated with anticoagulant at time of stroke S:Taking Xarelto 20 mg daily for anticoagulation.  Denies abnormal bleeding, palpitations.  He is also on metoprolol for rate control A/P:   Stable. Continue current medications.     # Hyperglycemia/overweight S:Wife manages  his diet. He is drinking more water than sweetened beverages. He has been walking for an hour daily. Unable to go to the [employer] x 3 months d/t pandemic.    He has lost a fair amount of weight related to intentional changes A/P: Congratulated patient on his effort and weight loss- down over 20 lbs.  Last A1c was trending down to 5.9-update today  # GERD S:Taking Famotidine 20 mg BID.  Managing sx well.  A/P: Stable. Continue current medications- can try once a day with dinner given weight loss  # Lower Abd PainHas noticed some changes with BM. Sometimes stool is small and hard stool, other times it seems to be more loose. Denies blood in the stool, fever, chills, n/v. Set up to see Dr. Carlean Purl to discuss next week-will defer to his expertise.    # Rash S:C/o burning rash in right groin x 2-3 weeks. Pain comes and goes. Worse when ambulating.   A/P: i Try lamisil in the right groin for 10 days to see if that clears up issues- I do not see obvious jock itch but with continued irritation worth a trial - hernia noted- advised surgical consult for right inguinal hernia- he dclines for now- gave urgent/emergent precautions  #fever a few months ago- wants covid antibody testing though acute covid test was negative.   Recommended follow up: 6 months recommended Future Appointments  Date Time Provider Banks  03/17/2019 10:30 AM Gatha Mayer, MD [facility]  03/29/2019  8:00 AM Baldwin Jamaica, PA-C [facility]  12/28/2019  1:45 PM [facility] LAB [facility] None  01/04/2020  1:00 PM Elayne Snare, MD [facility] None    Lab/Order associations: No diagnosis found.  No orders of the defined types were placed in this encounter.   Return precautions advised.  [name], MD

## 2019-03-10 LAB — SAR COV2 SEROLOGY (COVID19)AB(IGG),IA: SARS CoV2 AB IGG: NEGATIVE

## 2019-03-11 NOTE — Addendum Note (Signed)
Addended by: Gwenyth Ober R on: 03/11/2019 11:59 AM   Modules accepted: Orders

## 2019-03-16 ENCOUNTER — Other Ambulatory Visit (INDEPENDENT_AMBULATORY_CARE_PROVIDER_SITE_OTHER): Payer: Medicare Other

## 2019-03-16 ENCOUNTER — Other Ambulatory Visit: Payer: Self-pay

## 2019-03-16 DIAGNOSIS — E875 Hyperkalemia: Secondary | ICD-10-CM | POA: Diagnosis not present

## 2019-03-16 LAB — BASIC METABOLIC PANEL
BUN: 25 mg/dL — ABNORMAL HIGH (ref 6–23)
CO2: 23 mEq/L (ref 19–32)
Calcium: 9.5 mg/dL (ref 8.4–10.5)
Chloride: 107 mEq/L (ref 96–112)
Creatinine, Ser: 1.36 mg/dL (ref 0.40–1.50)
GFR: 50.72 mL/min — ABNORMAL LOW (ref >60.00)
Glucose, Bld: 79 mg/dL (ref 70–99)
Potassium: 5 mEq/L (ref 3.5–5.1)
Sodium: 139 mEq/L (ref 135–145)

## 2019-03-17 ENCOUNTER — Telehealth: Payer: Self-pay | Admitting: Internal Medicine

## 2019-03-17 ENCOUNTER — Telehealth: Payer: Self-pay

## 2019-03-17 ENCOUNTER — Ambulatory Visit (INDEPENDENT_AMBULATORY_CARE_PROVIDER_SITE_OTHER): Payer: Medicare Other | Admitting: Internal Medicine

## 2019-03-17 ENCOUNTER — Encounter: Payer: Self-pay | Admitting: Internal Medicine

## 2019-03-17 VITALS — BP 118/60 | HR 58 | Ht 75.0 in | Wt 208.0 lb

## 2019-03-17 DIAGNOSIS — R194 Change in bowel habit: Secondary | ICD-10-CM | POA: Diagnosis not present

## 2019-03-17 DIAGNOSIS — Z7901 Long term (current) use of anticoagulants: Secondary | ICD-10-CM | POA: Diagnosis not present

## 2019-03-17 DIAGNOSIS — I4821 Permanent atrial fibrillation: Secondary | ICD-10-CM | POA: Diagnosis not present

## 2019-03-17 DIAGNOSIS — Z86010 Personal history of colonic polyps: Secondary | ICD-10-CM

## 2019-03-17 DIAGNOSIS — K4090 Unilateral inguinal hernia, without obstruction or gangrene, not specified as recurrent: Secondary | ICD-10-CM

## 2019-03-17 NOTE — Patient Instructions (Addendum)
You have been scheduled for a colonoscopy. Please follow written instructions given to you at your visit today.  Please pick up your prep supplies at the pharmacy within the next 1-3 days. If you use inhalers (even only as needed), please bring them with you on the day of your procedure.   You will be contaced by our office prior to your procedure for directions on holding your Xarelto..  If you do not hear from our office 1 week prior to your scheduled procedure, please call [phone] to discuss.   You have been scheduled for an appointment with Dr Alphonsa Overall at [facility]. Your appointment is on _____________ at _____________. Please arrive at ________________________ for registration. Make certain to bring a list of current medications, including any over the counter medications or vitamins. Also bring your co-pay if you have one as well as your insurance cards. [facility] is located at [address]. Should you need to reschedule your appointment, please contact them at [phone].   I appreciate the opportunity to care for you. Silvano Rusk, MD, [facility]

## 2019-03-17 NOTE — Telephone Encounter (Signed)
Pt is scheduled for colon 03/23/19 at 3:00 PM.  Please send prep instructions to pt.

## 2019-03-17 NOTE — Telephone Encounter (Signed)
[facility] Pre-operative Risk Assessment     Request for surgical clearance:     Endoscopy Procedure  What type of surgery is being performed?     Colonoscopy  When is this surgery scheduled?     TBD  What type of clearance is required ?   Pharmacy  Are there any medications that need to be held prior to surgery and how long? Xarelto, one day  Practice name and name of physician performing surgery?      [facility]  What is your office phone and fax number?      Phone- [phone]  [phone]  Anesthesia type (None, local, MAC, general) ?       MAC

## 2019-03-17 NOTE — Progress Notes (Signed)
Elijah [name] 78 y.o. 12/18/1940 [id number]  Assessment & Plan:   Encounter Diagnoses  Name Primary?  . Change in bowel habits Yes  . Hx of adenomatous colonic polyps   . Right inguinal hernia   . Permanent atrial fibrillation   . Long term current use of anticoagulant     Schedule colonoscopy to evaluate change in bowel habits and history of adenomatous colon polyps. Hold Xarelto for 1 day to reduce risk of stroke though rare he has had atrial fibrillation has had a stroke that he is not sure is related to the A. fib it might of been.  His chads score was a 4.  Clarify with cardiology.  He understands the increased but rare risk of stroke in the short period of time off anticoagulation.  Other typical endoscopy risks colonoscopy risks explained to the patient.  His inguinal hernia is symptomatic I will refer him to Dr. Alphonsa Overall for evaluation and consideration of treatment. Handouts about hernia repair provided to the patient. I explained I suppose it is possible this was related to his robotic assisted laparoscopic prostatectomy years ago but I really did not know and it really does not matter at this point.  I appreciate the opportunity to care for this patient. CC: [name], MD Dr. Alphonsa Overall  Subjective:   Chief Complaint: Change in bowel habits hernia history of colon polyps  HPI The patient presents regarding some change in bowel habits and a history of colon polyp.  He is noted that his stools will narrow sometimes and be small and ball like but then returned to normal.  This is happened within the last year.  He does have a history of adenomatous colon polyps.  I saw him in 2018 and we both decided to defer a colonoscopy at that time and to consider it in the future though with his age etc. were not sure would make a difference in life expectancy.  Given the change in bowel habits he is now more inclined to consider that.  He has not had rectal bleeding.   There is no abdominal pain.  He does have right groin pain and discomfort and said that Dr. [name] identified a hernia on recent visit.  Notes reviewed.  Appetite is good no unintentional weight loss. Allergies  Allergen Reactions  . Ciprofloxacin Nausea Only  . Flagyl [Metronidazole] Nausea Only   Current Meds  Medication Sig  . acetaminophen (TYLENOL) 500 MG tablet Take 500 mg by mouth 2 (two) times daily.   . Cholecalciferol (VITAMIN D PO) Take 1 tablet by mouth 2 (two) times daily. 1000units daily  . famotidine (PEPCID) 20 MG tablet Take 1 tablet (20 mg total) by mouth 2 (two) times daily.  . metoprolol succinate (TOPROL-XL) 25 MG 24 hr tablet Take 1 tablet (25 mg total) by mouth daily. Take with or immediately following a meal.  . simvastatin (ZOCOR) 20 MG tablet TAKE 1 TABLET BY MOUTH EVERY DAY  . XARELTO 20 MG TABS tablet TAKE 1 TABLET BY MOUTH EVERY DAY   Past Medical History:  Diagnosis Date  . Adenomatous colon polyp   . Arthritis 09-10-11   rt. hip, knee, hands  . Cancer ([place]) 09-10-11   dx. Prostate cancer-bx. done 12'12, surgery planned  . Cataract   . Diverticulosis   . Hemorrhoid   . Hx of adenomatous colonic polyps 06/05/2011  . Hyperlipidemia   . Hypertension   . 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  etiology  . Peripheral neuropathy   . Permanent atrial fibrillation 09/10/2011  . Prostate cancer ([place])   . Stroke [facility])    Past Surgical History:  Procedure Laterality Date  . COLONOSCOPY  04/2003, 06/05/2011   diverticulosis, internal and external hemorrhoids 2004 and 2012, 2 small polyps 2012  . EYE SURGERY  09-10-11   Only stitches to close cut, and observation of hematoma from injury  . ROBOT ASSISTED LAPAROSCOPIC RADICAL PROSTATECTOMY  09/15/2011   Procedure: ROBOTIC ASSISTED LAPAROSCOPIC RADICAL PROSTATECTOMY LEVEL 2;  Surgeon: Dutch Gray, MD;  Location: [facility];  Service: Urology;  Laterality: N/A;        . TONSILLECTOMY     Social History   Social  History Narrative   Married with 3 kids. 6 grandkids.       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       Hobbies: beach (place at [employer]) usually every 2 weeks.    family history includes Alzheimer's disease in his paternal grandmother; Diabetes in his mother; Diverticulosis in his mother; Heart attack in his paternal grandfather; Heart attack (age of onset: 92) in his father; Heart disease (age of onset: 18) in his father; Hypertension in his mother; Prostate cancer in his brother.   Review of Systems See HPI  Objective:   Physical Exam @BP  118/60   Pulse (!) 58   Ht 6\' 3"  (1.905 m)   Wt 208 lb (94.3 kg)   BMI 26.00 kg/m @  General:  Well-developed, well-nourished and in no acute distress Eyes:  anicteric. Lungs: Clear to auscultation bilaterally. Heart:   irregS1S2, no rubs, murmurs, gallops. Abdomen:  soft, non-tender, no hepatosplenomegaly, or mass and BS+. Direct r ing hernia Rectal: deferred . Extremities:   no edema, cyanosis or clubbing Skin   no rash. Neuro:  A&O x 3.  Psych:  appropriate mood and  Affect.   Data Reviewed:  As per HPI

## 2019-03-17 NOTE — Telephone Encounter (Signed)
I have sent the patient instructions thru [facility] and I also spoke to him on the phone and we went over the highlights. He is going to call me back with any questions. I have mailed him the consent and he will bring it to his visit next week. He is aware that we are waiting on his Xarelto clearance.

## 2019-03-18 ENCOUNTER — Encounter: Payer: Self-pay | Admitting: Family Medicine

## 2019-03-18 ENCOUNTER — Telehealth: Payer: Self-pay

## 2019-03-18 NOTE — Telephone Encounter (Signed)
Patient with diagnosis of afib on xarelto for anticoagulation.    Procedure: Colonoscopy Date of procedure: TBD  CHADS2-VASc score of  5 ( HTN, AGE, stroke/tia x 2, AGE)  CrCl 30ml/min  Per office protocol, patient can hold Xarelto for 1 day prior to procedure.

## 2019-03-18 NOTE — Telephone Encounter (Signed)
   Primary Cardiologist: Dr Rayann Heman  Chart reviewed as part of pre-operative protocol coverage. Given past medical history and time since last visit, based on ACC/AHA guidelines, Elijah [name] would be at acceptable risk for the planned procedure without further cardiovascular testing.   OK to hold Xarelto 1 day pre op if needed.  I will route this recommendation to the requesting party via Epic fax function and remove from pre-op pool.  Please call with questions.  Kerin Ransom, PA-C 03/18/2019, 9:46 AM

## 2019-03-18 NOTE — Telephone Encounter (Signed)
Patient informed okay to hold xarelto 1 day per cardiology and he verbalized understanding.   While we were on the phone he said he had called [facility] this AM himself and they made him a appointment with Dr Alphonsa Overall for 03/30/2019 at 10:30AM. I told him we will fax over his notes to them.

## 2019-03-22 ENCOUNTER — Telehealth: Payer: Self-pay

## 2019-03-22 NOTE — Telephone Encounter (Signed)
Pt responded "no" to all screening questions °

## 2019-03-22 NOTE — Telephone Encounter (Signed)
Covid-19 screening questions   Do you now or have you had a fever in the last 14 days?  Do you have any respiratory symptoms of shortness of breath or cough now or in the last 14 days?  Do you have any family members or close contacts with diagnosed or suspected Covid-19 in the past 14 days?  Have you been tested for Covid-19 and found to be positive?      Left message to call back.

## 2019-03-23 ENCOUNTER — Encounter: Payer: Self-pay | Admitting: Internal Medicine

## 2019-03-23 ENCOUNTER — Other Ambulatory Visit: Payer: Self-pay

## 2019-03-23 ENCOUNTER — Ambulatory Visit: Payer: Medicare Other

## 2019-03-23 ENCOUNTER — Ambulatory Visit (AMBULATORY_SURGERY_CENTER): Payer: Medicare Other | Admitting: Internal Medicine

## 2019-03-23 ENCOUNTER — Other Ambulatory Visit: Payer: Self-pay | Admitting: Internal Medicine

## 2019-03-23 VITALS — BP 121/70 | HR 73 | Temp 99.1°F | Resp 18 | Ht 75.0 in | Wt 208.0 lb

## 2019-03-23 DIAGNOSIS — R194 Change in bowel habit: Secondary | ICD-10-CM

## 2019-03-23 DIAGNOSIS — D123 Benign neoplasm of transverse colon: Secondary | ICD-10-CM

## 2019-03-23 DIAGNOSIS — K6289 Other specified diseases of anus and rectum: Secondary | ICD-10-CM | POA: Diagnosis not present

## 2019-03-23 MED ORDER — SODIUM CHLORIDE 0.9 % IV SOLN: 500.0000 mL | Freq: Once | INTRAVENOUS | Status: DC

## 2019-03-23 NOTE — Op Note (Addendum)
[place] Patient Name: Elijah [name] Procedure Date: 03/23/2019 2:50 PM MRN: [id number] Endoscopist: Gatha Mayer , MD Age: 78 Referring MD:  Date of Birth: Oct 22, 1940 Gender: Male Account #: [id number] Procedure:                Colonoscopy Indications:              Personal history of colonic polyps, Change in bowel                            habits Medicines:                Propofol per Anesthesia, Monitored Anesthesia Care Procedure:                Pre-Anesthesia Assessment:                           - Prior to the procedure, a History and Physical                            was performed, and patient medications and                            allergies were reviewed. The patient's tolerance of                            previous anesthesia was also reviewed. The risks                            and benefits of the procedure and the sedation                            options and risks were discussed with the patient.                            All questions were answered, and informed consent                            was obtained. Prior Anticoagulants: The patient                            last took Xarelto (rivaroxaban) 2 days prior to the                            procedure. ASA Grade Assessment: III - A patient                            with severe systemic disease. After reviewing the                            risks and benefits, the patient was deemed in                            satisfactory condition to undergo the procedure.  After obtaining informed consent, the colonoscope                            was passed under direct vision. Throughout the                            procedure, the patient's blood pressure, pulse, and                            oxygen saturations were monitored continuously. The                            Colonoscope was introduced through the anus and                            advanced to the the cecum,  identified by                            appendiceal orifice and ileocecal valve. The                            colonoscopy was performed without difficulty. The                            patient tolerated the procedure well. The quality                            of the bowel preparation was excellent. The                            ileocecal valve, appendiceal orifice, and rectum                            were photographed. The bowel preparation used was                            Miralax via split dose instruction. Scope In: 3:08:09 PM Scope Out: 3:22:04 PM Scope Withdrawal Time: 0 hours 11 minutes 16 seconds  Total Procedure Duration: 0 hours 13 minutes 55 seconds  Findings:                 The perianal examination was normal.                           The digital rectal exam findings include surgically                            absent prostate.                           A 2 mm polyp was found in the transverse colon. The                            polyp was sessile. The polyp was removed with a  cold snare. Resection and retrieval were complete.                            Verification of patient identification for the                            specimen was done. Estimated blood loss was minimal.                           A diffuse area of moderately congested,                            erythematous, friable (with contact bleeding) and                            inflamed mucosa was found in the distal rectum.                            Biopsies were taken with a cold forceps for                            histology. Verification of patient identification                            for the specimen was done. Estimated blood loss was                            minimal.                           Internal hemorrhoids were found. The hemorrhoids                            were Grade II (internal hemorrhoids that prolapse                            but  reduce spontaneously).                           The exam was otherwise without abnormality on                            direct and retroflexion views. Complications:            No immediate complications. Estimated Blood Loss:     Estimated blood loss was minimal. 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                           - One 2 mm polyp in the transverse colon, removed  with a cold snare. Resected and retrieved.                           - Congested, erythematous, friable (with contact                            bleeding) and inflamed mucosa in the distal rectum.                            Biopsied.                           - Internal hemorrhoids.                           - The examination was otherwise normal on direct                            and retroflexion views. Recommendation:           - Patient has a contact number available for                            emergencies. The signs and symptoms of potential                            delayed complications were discussed with the                            patient. Return to normal activities tomorrow.                            Written discharge instructions were provided to the                            patient.                           - Resume previous diet.                           - Continue present medications.                           - Resume Xarelto (rivaroxaban) at prior dose                            tomorrow.                           - Await pathology results.                           - No repeat colonoscopy due to age.                           - ? Inflamed hemorrhoids vs proctitis and  hemorrhoids - HE REPORTED IN RECOVERY THAT THE                            BLEEDING AND IRRITATION SEEMED RELATED TO THE PREP Gatha Mayer, MD 03/23/2019 3:29:52 PM This report has been  signed electronically.

## 2019-03-23 NOTE — Patient Instructions (Addendum)
I found and removed one tiny polyp.  I saw some inflammation in the rectum and anal canal - there are hemorrhoids but ? Also proctitis. I took biopisies and will let you know results and any recommendations.  No signs of cancer.  Please restart Xarelto tomorrow.   I appreciate the opportunity to care for you. Gatha Mayer, MD, FACG   HANDOUT ON POLYPS GIVEN TO YOU TODAY  AWAIT PATHOLOGY ON POLYP REMOVED AND ON BIOPSIES DONE   YOU HAD AN ENDOSCOPIC PROCEDURE TODAY AT [facility]:   Refer to the procedure report that was given to you for any specific questions about what was found during the examination.  If the procedure report does not answer your questions, please call your gastroenterologist to clarify.  If you requested that your care partner not be given the details of your procedure findings, then the procedure report has been included in a sealed envelope for you to review at your convenience later.  YOU SHOULD EXPECT: Some feelings of bloating in the abdomen. Passage of more gas than usual.  Walking can help get rid of the air that was put into your GI tract during the procedure and reduce the bloating. If you had a lower endoscopy (such as a colonoscopy or flexible sigmoidoscopy) you may notice spotting of blood in your stool or on the toilet paper. If you underwent a bowel prep for your procedure, you may not have a normal bowel movement for a few days.  Please Note:  You might notice some irritation and congestion in your nose or some drainage.  This is from the oxygen used during your procedure.  There is no need for concern and it should clear up in a day or so.  SYMPTOMS TO REPORT IMMEDIATELY:   Following lower endoscopy (colonoscopy or flexible sigmoidoscopy):  Excessive amounts of blood in the stool  Significant tenderness or worsening of abdominal pains  Swelling of the abdomen that is new, acute  Fever of 100F or higher    For urgent  or emergent issues, a gastroenterologist can be reached at any hour by calling [phone].   DIET:  We do recommend a small meal at first, but then you may proceed to your regular diet.  Drink plenty of fluids but you should avoid alcoholic beverages for 24 hours.  ACTIVITY:  You should plan to take it easy for the rest of today and you should NOT DRIVE or use heavy machinery until tomorrow (because of the sedation medicines used during the test).    FOLLOW UP: Our staff will call the number listed on your records 48-72 hours following your procedure to check on you and address any questions or concerns that you may have regarding the information given to you following your procedure. If we do not reach you, we will leave a message.  We will attempt to reach you two times.  During this call, we will ask if you have developed any symptoms of COVID 19. If you develop any symptoms (ie: fever, flu-like symptoms, shortness of breath, cough etc.) before then, please call [phone].  If you test positive for Covid 19 in the 2 weeks post procedure, please call and report this information to [place].    If any biopsies were taken you will be contacted by phone or by letter within the next 1-3 weeks.  Please call us at [phone] if you have not heard about the biopsies in 3 weeks.  SIGNATURES/CONFIDENTIALITY: You and/or your care partner have signed paperwork which will be entered into your electronic medical record.  These signatures attest to the fact that that the information above on your After Visit Summary has been reviewed and is understood.  Full responsibility of the confidentiality of this discharge information lies with you and/or your care-partner.

## 2019-03-23 NOTE — Progress Notes (Signed)
[place]

## 2019-03-23 NOTE — Progress Notes (Signed)
Called to room to assist during endoscopic procedure.  Patient ID and intended procedure confirmed with present staff. Received instructions for my participation in the procedure from the performing physician.  

## 2019-03-23 NOTE — Progress Notes (Signed)
Pt's states no medical or surgical changes since previsit or office visit. 

## 2019-03-23 NOTE — Progress Notes (Signed)
PT taken to PACU. Monitors in place. VSS. Report given to RN. 

## 2019-03-25 ENCOUNTER — Telehealth: Payer: Self-pay

## 2019-03-25 NOTE — Telephone Encounter (Signed)
  Follow up Call-  Call back number 03/23/2019  Post procedure Call Back phone  # [phone]  Permission to leave phone message Yes  Some recent data might be hidden     Patient questions:  Do you have a fever, pain , or abdominal swelling? No. Pain Score  0 *  Have you tolerated food without any problems? Yes.    Have you been able to return to your normal activities? Yes.    Do you have any questions about your discharge instructions: Diet   No. Medications  No. Follow up visit  No.  Do you have questions or concerns about your Care? No.  Actions: * If pain score is 4 or above: No action needed, pain <4.  1. Have you developed a fever since your procedure? no  2.   Have you had an respiratory symptoms (SOB or cough) since your procedure? no  3.   Have you tested positive for COVID 19 since your procedure no  4.   Have you had any family members/close contacts diagnosed with the COVID 19 since your procedure?  no   If yes to any of these questions please route to Joylene John, RN and Alphonsa Gin, Therapist, sports.

## 2019-03-25 NOTE — Telephone Encounter (Signed)
  Follow up Call-  Call back number 03/23/2019  Post procedure Call Back phone  # [phone]  Permission to leave phone message Yes  Some recent data might be hidden     Left message

## 2019-03-26 NOTE — Progress Notes (Signed)
Cardiology Office Note Date:  03/26/2019  Patient ID:  Elijah [name], DOB November 20, 1940, MRN [id number] PCP:  [name], MD  Cardiologist:  Remotely, Dr. Rayann Heman   Chief Complaint: 6 mo visit  History of Present Illness: Elijah [name] is a 78 y.o. male with history of permanent AFib, HTN, HLD, CRI stage III, peripheral neuropathy, prostate cancer s/p surgery with subsequent problems with fecal incontinence at times, 2017 hospitalized at [facility] with transient facial droop, slurred speech found with multiple R sided CVA's, seen by neurology and started on Xarelto for a/c given his AF discharge 05/05/16. 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  He has historically been seen in the AF clinic via his PMD in May of 2016, though despite conversation of concerns about possible TIA like symptoms, the patient was not convinced about starting a/c.  He was seen by myself (case reviewed with Dr. Lovena Le at the time of visit) in Oct 2017,  as a new patient to [facility] to re-establish EP care secondary to his AF.  He reported seeing Dr. Rayann Heman +/- 7-8 years ago, at that time, for possible AF ablation, but recalls felt that he was a poor candidate given he had already been in AF for years and was without symptoms.  He denied any kind of palpitations or exertional intolerances, was unaware of his AF, no CP, or SOB.  He reported his neurological symptoms completely resolved within an hour or so and he remained feeling well.  He denied any dizziness, near syncope or syncope, he has neuropathy chronically and occassional gait instability with infrequent falls, no injuries.  He was planned for PMD for f/u then.  I saw him in Aug 2018, he was doing very welll.  [place] [facility] [place] [facility] walking with his hips/knees being his only exertional limitations.  No CP, palpitations or SOB, no dizziness, near syncope or syncope.  3 months prior he had some small amounts of  blood-tinged urine saw urology with labs and work up that he said noted some "sore" on his bladder that had healed, and felt to be the culprit and had not been recurrent.  I suggested we do labs given his xarelto but prefered not to having had labs done by he says by urology and at least knew for sure his kidney function was good, and has full labs planned with PMD in a month or so.  He also was following with neurology and endocrinology.  Hew was last seen by Dr. Rayann Heman Aug 2019, doing well, declined sleep study with c/o fatigue, (and snoring).  Also offered to try decreased dose of his BB 2/2 c/o fatigue but did not want to make changes.  Planned for annual APP visits.  03/09/19: [place] 15/46 03/16/19: Creat 1.36  (cal crcl 60 using today's weight)   He is doing well.  Had a colonoscopy last week with some heorrhoidal bleeding afterwards that has resolved, otherwise doing well with his xarelto.  He is walking with his wife in the park, reports good exertional capacity, no CP or SOB.  He denies any palpitations or cardiac awareness. No dizzy spells, near syncope or syncope.  He contiues with a general vague sense of fatigue, less energy though thinks this was a result of his stroke (timing-wise).  He does not feel like jhe sleeps poorly, does snore on occasion, but doesn't feel like he has apnea, or need of sleep study. He did decrease  his Toprol in half after Dr. Jackalyn Lombard visit and feels like his HR has settled generally in the 60's-70's.  He reports labs are monitored with his PMD, usually Q 6 months   Past Medical History:  Diagnosis Date  . Adenomatous colon polyp   . Arthritis 09-10-11   rt. hip, knee, hands  . Cancer ([place]) 09-10-11   dx. Prostate cancer-bx. done 12'12, surgery planned  . Cataract   . Diverticulosis   . Hemorrhoid   . Hx of adenomatous colonic polyps 06/05/2011  . Hyperlipidemia   . Hypertension   . 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 etiology  . Peripheral neuropathy    . Permanent atrial fibrillation 09/10/2011  . Prostate cancer ([place])   . Stroke [facility])     Past Surgical History:  Procedure Laterality Date  . COLONOSCOPY  04/2003, 06/05/2011   diverticulosis, internal and external hemorrhoids 2004 and 2012, 2 small polyps 2012  . EYE SURGERY  09-10-11   Only stitches to close cut, and observation of hematoma from injury  . ROBOT ASSISTED LAPAROSCOPIC RADICAL PROSTATECTOMY  09/15/2011   Procedure: ROBOTIC ASSISTED LAPAROSCOPIC RADICAL PROSTATECTOMY LEVEL 2;  Surgeon: Dutch Gray, MD;  Location: [facility];  Service: Urology;  Laterality: N/A;        . TONSILLECTOMY      Current Outpatient Medications  Medication Sig Dispense Refill  . acetaminophen (TYLENOL) 500 MG tablet Take 500 mg by mouth 2 (two) times daily.     . Cholecalciferol (VITAMIN D PO) Take 1 tablet by mouth 2 (two) times daily. 1000units daily    . famotidine (PEPCID) 20 MG tablet Take 1 tablet (20 mg total) by mouth 2 (two) times daily. 180 tablet 3  . metoprolol succinate (TOPROL-XL) 25 MG 24 hr tablet Take 1 tablet (25 mg total) by mouth daily. Take with or immediately following a meal. 90 tablet 3  . simvastatin (ZOCOR) 20 MG tablet TAKE 1 TABLET BY MOUTH EVERY DAY 90 tablet 1  . XARELTO 20 MG TABS tablet TAKE 1 TABLET BY MOUTH EVERY DAY 90 tablet 0   No current facility-administered medications for this visit.     Allergies:   Ciprofloxacin and Flagyl [metronidazole]   Social History:  The patient  reports that he quit smoking about 50 years ago. He has never used smokeless tobacco. He reports that he does not drink alcohol or use drugs.   Family History:  The patient's family history includes Alzheimer's disease in his paternal grandmother; Diabetes in his mother; Diverticulosis in his mother; Heart attack in his paternal grandfather; Heart attack (age of onset: 66) in his father; Heart disease (age of onset: 59) in his father; Hypertension in his mother; Prostate cancer in his  brother.  ROS:  Please see the history of present illness.  All other systems are reviewed and otherwise negative.   PHYSICAL EXAM:  VS:  There were no vitals taken for this visit. BMI: There is no height or weight on file to calculate BMI. Well nourished, well developed, in no acute distress  HEENT: normocephalic, atraumatic  Neck: no JVD, carotid bruits or masses Cardiac: irreg-irreg, no significant murmurs, no rubs, or gallops Lungs:  CTA b/l, no wheezing, rhonchi or rales  Abd: soft, nontender MS: no deformity or atrophy Ext:  no edema  Skin: warm and dry, no rash Neuro:  No gross deficits appreciated Psych: euthymic mood, full affect   EKG:  Done today and reviewed by myself AFib 67bpm, RBBB,  appears unchanged from previous  05/15/16: TTE Study Conclusions - Left ventricle: The cavity size was normal. Wall thickness was   increased in a pattern of mild LVH. Systolic function was normal.   The estimated ejection fraction was in the range of 55% to 60%.   Wall motion was normal; there were no regional wall motion   abnormalities. - Mitral valve: Calcified annulus. There was mild regurgitation. - Left atrium: The atrium was severely dilated. - Right ventricle: The cavity size was mildly dilated. - Right atrium: The atrium was severely dilated. Impressions: - Normal LV systolic function; mild LVH; severe biatrial  (LA 1mm)   enlargement; mild RVE; mild MR; trace TR.  Recent Labs: 09/01/2018: TSH 1.23 03/09/2019: ALT 21; Hemoglobin 15.9; Platelets 184.0 03/16/2019: BUN 25; Creatinine, Ser 1.36; Potassium 5.0 Hemolysis seen..; Sodium 139  09/01/2018: Cholesterol 133; HDL 33.30; LDL Cholesterol 68; Total CHOL/HDL Ratio 4; Triglycerides 158.0; VLDL 31.6 03/09/2019: Direct LDL 74.0   Estimated Creatinine Clearance: 54.4 mL/min (by C-G formula based on SCr of 1.36 mg/dL).   Wt Readings from Last 3 Encounters:  03/23/19 208 lb (94.3 kg)  03/17/19 208 lb (94.3 kg)  03/09/19 209 lb  3.2 oz (94.9 kg)     Other studies reviewed: Additional studies/records reviewed today include: summarized above  ASSESSMENT AND PLAN:  1. Long hx of permanent AFib     rate controlled and asymptomatic     CHA2DS2Vasc is 4,  on Xarelto,  appropriately dosed        2.HTN   Looks good, no changes    Disposition: Will continue to see him annually, sooner if needed    Current medicines are reviewed at length with the patient today.  The patient did not have any concerns regarding medicines.  Haywood Lasso, PA-C 03/26/2019 5:59 PM     [address] [place] [address] [phone] (office)  [phone] (fax)

## 2019-03-29 ENCOUNTER — Encounter: Payer: Self-pay | Admitting: Physician Assistant

## 2019-03-29 ENCOUNTER — Other Ambulatory Visit: Payer: Self-pay

## 2019-03-29 ENCOUNTER — Ambulatory Visit (INDEPENDENT_AMBULATORY_CARE_PROVIDER_SITE_OTHER): Payer: Medicare Other | Admitting: Physician Assistant

## 2019-03-29 VITALS — BP 128/70 | HR 67 | Ht 75.0 in | Wt 205.0 lb

## 2019-03-29 DIAGNOSIS — I10 Essential (primary) hypertension: Secondary | ICD-10-CM | POA: Diagnosis not present

## 2019-03-29 DIAGNOSIS — I4821 Permanent atrial fibrillation: Secondary | ICD-10-CM

## 2019-03-29 NOTE — Patient Instructions (Signed)
Medication Instructions:   Your physician recommends that you continue on your current medications as directed. Please refer to the Current Medication list given to you today.  If you need a refill on your cardiac medications before your next appointment, please call your pharmacy.   Lab work:NONE ORDERED  TODAY   If you have labs (blood work) drawn today and your tests are completely normal, you will receive your results only by: . MyChart Message (if you have MyChart) OR . A paper copy in the mail If you have any lab test that is abnormal or we need to change your treatment, we will call you to review the results.  Testing/Procedures:NONE ORDERED  TODAY   Follow-Up: At [facility], you and your health needs are our priority.  As part of our continuing mission to provide you with exceptional heart care, we have created designated Provider Care Teams.  These Care Teams include your primary Cardiologist (physician) and Advanced Practice Providers (APPs -  Physician Assistants and Nurse Practitioners) who all work together to provide you with the care you need, when you need it. You will need a follow up appointment in 1 years.  Please call our office 2 months in advance to schedule this appointment.  You may see James Allred, MD or one of the following Advanced Practice Providers on your designated Care Team:  Renee Ursuy, PA-C  Any Other Special Instructions Will Be Listed Below (If Applicable).     

## 2019-03-30 DIAGNOSIS — K4090 Unilateral inguinal hernia, without obstruction or gangrene, not specified as recurrent: Secondary | ICD-10-CM | POA: Diagnosis not present

## 2019-03-30 DIAGNOSIS — Z7901 Long term (current) use of anticoagulants: Secondary | ICD-10-CM | POA: Diagnosis not present

## 2019-04-01 ENCOUNTER — Telehealth: Payer: Self-pay

## 2019-04-01 NOTE — Telephone Encounter (Signed)
   [facility] Pre-operative Risk Assessment    Request for surgical clearance:  1. What type of surgery is being performed? RIGHT INGUINAL HERNIA REPAIR   2. When is this surgery scheduled?  TBD   3. What type of clearance is required (medical clearance vs. Pharmacy clearance to hold med vs. Both)? Pharmacy  4. Are there any medications that need to be held prior to surgery and how long? Xarelto   5. Practice name and name of physician performing surgery? [facility] Alphonsa Overall   6. What is your office phone number [phone]    7.   What is your office fax number [phone]  8.   Anesthesia type (None, local, MAC, general) ?  general   Frederik Schmidt 04/01/2019, 3:45 PM  _________________________________________________________________   (provider comments below)

## 2019-04-03 NOTE — Telephone Encounter (Signed)
Clinical pharmacist to review Xarelto 

## 2019-04-04 NOTE — Telephone Encounter (Signed)
Proceed with surgery, holding on 24 hours of xarelto prior to surgery and restarting as soon as able post operatively

## 2019-04-04 NOTE — Telephone Encounter (Signed)
Patient with diagnosis of afib on Xarelto for anticoagulation.    Procedure: RIGHT INGUINAL HERNIA REPAIR  Date of procedure: TBD  CHADS2-VASc score of  5 (HTN, AGE, stroke/tia x 2, AGE)  CrCl 57ml/min  Patient is high risk off anticoagulation due to history of stroke (2017) and questionable TIA (2016). I will route to MD for input.

## 2019-04-05 NOTE — Telephone Encounter (Signed)
   Primary Cardiologist: Dr. Thompson Grayer  Chart reviewed as part of pre-operative protocol coverage. Patient has a history of prior CVA, atrial fibrillation, hyertension, hyeprlipidemia, CKD stage III, peripheral neuropathy, prostate cancer s/p surgery. Patient in need of right inguinal hernia repair and we were requested to give medical clearance as well as recommendations for holding home Xarelto. Patient recently seen by Tommye Standard, PA-C, in our office on 03/29/2019 at which time patient was doing well from a cardiac standpoint.   Per Dr. Rayann Heman, patient at acceptable risk for planned procedure. No further cardiovascular testing needed at this time. Dr. Rayann Heman has recommended holding Xarelto 24 hours prior to procedure and restarting as soon as able post operatively.  I will route this recommendation to the requesting party via Epic fax function and remove from pre-op pool.  Please call with questions.  Darreld Mclean, PA-C 04/05/2019, 3:42 PM

## 2019-04-22 ENCOUNTER — Encounter: Payer: Self-pay | Admitting: Internal Medicine

## 2019-04-22 DIAGNOSIS — S91109A Unspecified open wound of unspecified toe(s) without damage to nail, initial encounter: Secondary | ICD-10-CM | POA: Diagnosis not present

## 2019-04-22 DIAGNOSIS — Z6825 Body mass index (BMI) 25.0-25.9, adult: Secondary | ICD-10-CM | POA: Diagnosis not present

## 2019-04-22 NOTE — Progress Notes (Signed)
Diminutive ssp Normal bxs No recall  My chart letter

## 2019-04-25 ENCOUNTER — Encounter: Payer: Self-pay | Admitting: Family Medicine

## 2019-04-27 ENCOUNTER — Encounter: Payer: Self-pay | Admitting: Family Medicine

## 2019-04-27 ENCOUNTER — Ambulatory Visit (INDEPENDENT_AMBULATORY_CARE_PROVIDER_SITE_OTHER): Payer: Medicare Other | Admitting: Family Medicine

## 2019-04-27 ENCOUNTER — Other Ambulatory Visit: Payer: Self-pay

## 2019-04-27 VITALS — BP 132/88 | HR 55 | Temp 97.9°F | Ht 75.0 in | Wt 206.8 lb

## 2019-04-27 DIAGNOSIS — Z23 Encounter for immunization: Secondary | ICD-10-CM

## 2019-04-27 DIAGNOSIS — G609 Hereditary and idiopathic neuropathy, unspecified: Secondary | ICD-10-CM | POA: Diagnosis not present

## 2019-04-27 DIAGNOSIS — L97522 Non-pressure chronic ulcer of other part of left foot with fat layer exposed: Secondary | ICD-10-CM

## 2019-04-27 NOTE — Progress Notes (Signed)
Phone [phone]   Subjective:  Elijah [name] is a 78 y.o. year old very pleasant male patient who presents for/with See problem oriented charting Chief Complaint  Patient presents with  . Acute Visit  . toe problem   ROS-no fever/chills/nausea/vomiting.  Has not noted expanding redness near the toe  Past Medical History-  Patient Active Problem List   Diagnosis Date Noted  . History of CVA (cerebrovascular accident) 05/14/2016    Priority: High  . History of melanoma 12/04/2015    Priority: High  . Atrial fibrillation ([place]) 04/06/2007    Priority: High  . Hyperglycemia 08/13/2016    Priority: Medium  . Thyroid nodule     Priority: Medium  . GERD (gastroesophageal reflux disease) 12/04/2015    Priority: Medium  . CKD (chronic kidney disease), stage III ([place]) 12/07/2014    Priority: Medium  . History of prostate cancer 09/23/2012    Priority: Medium  . 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 Essential hypertension 04/07/2007    Priority: Medium  . Hyperlipidemia 04/06/2007    Priority: Medium  . Gross hematuria 02/12/2017    Priority: Low  . Osteoarthritis of left hip 08/09/2014    Priority: Low  . Actinic keratosis 06/05/2014    Priority: Low  . Double vision 12/14/2017  . Hx of adenomatous colonic polyps 06/05/2011    Medications- reviewed and updated Current Outpatient Medications  Medication Sig Dispense Refill  . acetaminophen (TYLENOL) 500 MG tablet Take 500 mg by mouth 2 (two) times daily.     . Cholecalciferol (VITAMIN D PO) Take 1 tablet by mouth 2 (two) times daily. 1000units daily    . famotidine (PEPCID) 20 MG tablet Take 1 tablet (20 mg total) by mouth 2 (two) times daily. 180 tablet 3  . metoprolol succinate (TOPROL-XL) 25 MG 24 hr tablet Take 1 tablet (25 mg total) by mouth daily. Take with or immediately following a meal. 90 tablet 3  . simvastatin (ZOCOR) 20 MG tablet TAKE 1 TABLET BY MOUTH EVERY DAY 90  tablet 1  . XARELTO 20 MG TABS tablet TAKE 1 TABLET BY MOUTH EVERY DAY 90 tablet 0   No current facility-administered medications for this visit.      Objective:  BP 132/88   Pulse (!) 55   Temp 97.9 F (36.6 C)   Ht 6\' 3"  (1.905 m)   Wt 206 lb 12.8 oz (93.8 kg)   SpO2 98%   BMI 25.85 kg/m  Gen: NAD, resting comfortably  CV: Bradycardic Lungs: nonlabored, normal respiratory rate Abdomen: soft/nondistended  Ext: no edema Skin: warm, dry, ulcerated area on left great toe- previously debrided.  At present in central area there is a 7 x 3 mm open wound-appears to be slowly healing from wound edges        Assessment and Plan  #Toe ulceration left great toe: #Idiopathic neuropathy S:  left big toe with blister from walking too much per patient over a week ago. Ad been walking for over an hour at local parks. History of idiopathic neuropathy and patient realizes this likely contributed- may have had wound/callus/blister without knowing it.   Patient went down to the beach last Wednesday and afterward noted area oozing/bleeding- realized he may have popped open a blister.  By Friday was still oozing/bleeding so [facility].  A Dr. Jesusita Oka that patient saw cut away some of the dead skin.  He was concerned about possible infection and placed patient on clindamycin 300 mg 3 times a day for 10 days.wound continues to ooze serosanguineous fluid but seems to be improving A/P: Patient with toe ulceration.  He is on clindamycin and does not appear to be infected.  Appears to be healing nicely- discussed continued conservative care.  Happy to see patient back in follow-up as needed.  Would encourage him to continue to look at feet daily to try to catch issues like this sooner if possible  Recommended follow up: As needed for acute issue Future Appointments  Date Time Provider [place]  04/27/2019 11:40 PM [name], MD  [facility]  09/14/2019  8:20 AM [name], MD [facility]  12/28/2019  1:45 PM [facility] None  01/04/2020  1:00 PM Elayne Snare, MD [facility] None    Lab/Order associations:   ICD-10-CM   1. Toe ulcer, left, with fat layer exposed ([place])  L97.522   2. Hereditary and idiopathic peripheral neuropathy  G60.9   3. Need for immunization against influenza  Z23 Flu Vaccine QUAD High Dose(Fluad)   Return precautions advised.  [name], MD

## 2019-04-27 NOTE — Patient Instructions (Addendum)
Health Maintenance Due  Topic Date Due  . INFLUENZA VACCINE -today 03/05/2019   Toe appears to be healing but is a slow process. Fine to finish clindamycin- I do not see any signs of infection at present.   I think when resting - fine to let it open to air  With activity- ok to keep bandaid on it. Our team will bandage you up before you go.

## 2019-05-25 ENCOUNTER — Other Ambulatory Visit: Payer: Self-pay | Admitting: Family Medicine

## 2019-06-14 ENCOUNTER — Other Ambulatory Visit (HOSPITAL_COMMUNITY)
Admission: RE | Admit: 2019-06-14 | Discharge: 2019-06-14 | Disposition: A | Discharge status: Home / Self Care | Payer: Medicare Other | Source: Ambulatory Visit | Attending: Surgery | Admitting: Surgery

## 2019-06-14 DIAGNOSIS — Z20828 Contact with and (suspected) exposure to other viral communicable diseases: Secondary | ICD-10-CM | POA: Insufficient documentation

## 2019-06-14 DIAGNOSIS — Z01812 Encounter for preprocedural laboratory examination: Secondary | ICD-10-CM | POA: Diagnosis not present

## 2019-06-14 LAB — SARS CORONAVIRUS 2 (TAT 6-24 HRS): SARS Coronavirus 2: NEGATIVE

## 2019-06-15 ENCOUNTER — Other Ambulatory Visit: Payer: Self-pay

## 2019-06-15 ENCOUNTER — Encounter (HOSPITAL_COMMUNITY): Payer: Self-pay | Admitting: *Deleted

## 2019-06-15 NOTE — Anesthesia Preprocedure Evaluation (Addendum)
Anesthesia Evaluation  Patient identified by MRN, date of birth, ID band Patient awake    Reviewed: Allergy & Precautions, NPO status , Patient's Chart, lab work & pertinent test results  Airway Mallampati: I  TM Distance: >3 FB Neck ROM: full    Dental  (+) Teeth Intact, Dental Advidsory Given   Pulmonary former smoker,    breath sounds clear to auscultation       Cardiovascular hypertension,  Rhythm:irregular     Neuro/Psych CVA, No Residual Symptoms    GI/Hepatic GERD  Medicated and Controlled,  Endo/Other    Renal/GU      Musculoskeletal  (+) Arthritis , Osteoarthritis,    Abdominal   Peds  Hematology   Anesthesia Other Findings - Normal LV systolic function; mild LVH; severe biatrial   enlargement; mild RVE; mild MR; trace TR  Reproductive/Obstetrics                           Anesthesia Physical Anesthesia Plan  ASA: III  Anesthesia Plan: General   Post-op Pain Management:    Induction:   PONV Risk Score and Plan: 2 and Ondansetron, Dexamethasone and Midazolam  Airway Management Planned: Oral ETT  Additional Equipment:   Intra-op Plan:   Post-operative Plan: Extubation in OR  Informed Consent:     Dental Advisory Given and Dental advisory given  Plan Discussed with: Anesthesiologist, CRNA and Surgeon  Anesthesia Plan Comments: (PAT note written 06/15/2019 by Myra Gianotti, PA-C. )      Anesthesia Quick Evaluation

## 2019-06-15 NOTE — Progress Notes (Signed)
SDW patient  PCP -  Cardiologist - [facility]  Chest x-ray - 10/19/18 EKG - 03/29/19 Stress Test - denies ECHO - 2017 Cardiac Cath - denies  Blood Thinner Instructions: LD Xarelto 11/9 Aspirin Instructions: n/a   COVID TEST- negative 11/10   Anesthesia review: yes, hx afib  Patient denies shortness of breath, fever, cough and chest pain at PAT appointment   All instructions explained to the patient, with a verbal understanding of the material. Patient agrees to go over the instructions while at home for a better understanding. Patient also instructed to self quarantine after being tested for COVID-19. The opportunity to ask questions was provided.

## 2019-06-15 NOTE — H&P (Signed)
Elijah [name]  Location: [facility] Patient #: [id number] DOB: 10/20/1940 Married / Language: English / Race: White Male  History of Present Illness   The patient is a 78 year old male who presents with a complaint of hernia.  The PCP is Dr. Ansel Bong  The patient was referred by Dr. Carlean Purl  He comes by himself.  [The Covid-19 virus has disrupted normal medical care in [place] and across the nation. We have sometimes had to alter normal surgical/medical care to limit this epidemic and we have explained these changes to the patient.]  The patient noticed a bulge in his right groin for only a couple weeks. At times, particularly after standing, the groin burns. The hernia reduces with he lays down. He has no history of prior hernia surgery. He has no history of stomach, liver, or pancreas trouble. He underwent a recent colonoscopy by Dr. Carlean Purl which was negative. Dr. Carlean Purl suggested he see me for the hernia. He did have a prostatectomy by Dr. Alinda Money in 2013 for prostate cancer. His PSA is stable. He has some dribbling at times.  I discussed the indications and complications of hernia surgery with the patient. I discussed both the laparoscopic and open approach to hernia repair.. The potential risks of hernia surgery include, but are not limited to, bleeding, infection, open surgery, nerve injury, and recurrence of the hernia. I provided the patient literature about hernia surgery. We talked about the use of mesh in hernias and their risks. The risk of mesh include chronic infection, erosion to other structures, and chronic pain.  Plan: 1) The hernia is moderate in size and causing pain, so I think that it would be best if it was repaired. I would do this in an open fashion. (He has had some experience at the [facility] and wants to to there), 2) He would need cardiac clearance and plans for his Xarelto for surgery, 3) He wants to think  about the surgery and will call us back for his plans  Review of Systems as stated in this history (HPI) or in the review of systems. Otherwise all other 12 point ROS are negative  Past Medical History: 1. A Fib - chronic and long standing Sees Dr. Rayann Heman 2. On Xarelto 3. HTN 4. History of stroke - 2017 No residual changes 5. 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 Multinodular goiter followed by Dr. Dwyane Dee 7.  History of prostate ca  Robotic prostatectomy - 09/15/2011 - Alinda Money  Social History: Married 3 children His father is 27 He still works for [employer]. He says that he works just a little. 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 Graduated in 1964     Past Surgical History Nance Pew, [place]; 03/30/2019 10:34 AM) Cataract Surgery  Bilateral. Colon Polyp Removal - Colonoscopy  Oral Surgery  Prostate Surgery - Removal  Tonsillectomy   Diagnostic Studies History Nance Pew, CMA; 03/30/2019 10:34 AM) Colonoscopy  within last year  Allergies Nance Pew, CMA; 03/30/2019 10:37 AM) Aspirin Adult *ANALGESICS - NonNarcotic*  Fragmin *ANTICOAGULANTS*  Cipro *Fluoroquinolones**  Allergies Reconciled   Medication History (Sabrina Canty, CMA; 03/30/2019 10:38 AM) Xarelto (20MG  Tablet, Oral) Active. Simvastatin (20MG  Tablet, Oral) Active. Metoprolol Succinate ER (25MG  Tablet ER 24HR, Oral) Active. Famotidine (20MG  Tablet, Oral) Active. Medications Reconciled  Social History Nance Pew, CMA; 03/30/2019 10:34 AM) Alcohol use  Remotely quit alcohol use. No caffeine use  No drug  use  Tobacco use  Former smoker.  Family History Nance Pew, [place]; 03/30/2019 10:34 AM) Heart Disease  Father. Melanoma  Sister. Prostate Cancer  Brother.  Other Problems Nance Pew, CMA; 03/30/2019 10:34 AM) Arthritis  Atrial Fibrillation  Back Pain  Cerebrovascular  Accident  Diverticulosis  Gastroesophageal Reflux Disease  Hemorrhoids  Inguinal Hernia  Prostate Cancer     Review of Systems (Sabrina Canty CMA; 03/30/2019 10:34 AM) General Not Present- Appetite Loss, Chills, Fatigue, Fever, Night Sweats, Weight Gain and Weight Loss. Skin Not Present- Change in Wart/Mole, Dryness, Hives, Jaundice, New Lesions, Non-Healing Wounds, Rash and Ulcer. HEENT Not Present- Earache, Hearing Loss, Hoarseness, Nose Bleed, Oral Ulcers, Ringing in the Ears, Seasonal Allergies, Sinus Pain, Sore Throat, Visual Disturbances, Wears glasses/contact lenses and Yellow Eyes. Respiratory Not Present- Bloody sputum, Chronic Cough, Difficulty Breathing, Snoring and Wheezing. Breast Not Present- Breast Mass, Breast Pain, Nipple Discharge and Skin Changes. Cardiovascular Not Present- Chest Pain, Difficulty Breathing Lying Down, Leg Cramps, Palpitations, Rapid Heart Rate, Shortness of Breath and Swelling of Extremities. Gastrointestinal Present- Hemorrhoids. Not Present- Abdominal Pain, Bloating, Bloody Stool, Change in Bowel Habits, Chronic diarrhea, Constipation, Difficulty Swallowing, Excessive gas, Gets full quickly at meals, Indigestion, Nausea, Rectal Pain and Vomiting. Male Genitourinary Not Present- Blood in Urine, Change in Urinary Stream, Frequency, Impotence, Nocturia, Painful Urination, Urgency and Urine Leakage. Musculoskeletal Present- Back Pain and Joint Pain. Not Present- Joint Stiffness, Muscle Pain, Muscle Weakness and Swelling of Extremities. Neurological Not Present- Decreased Memory, Fainting, Headaches, Numbness, Seizures, Tingling, Tremor, Trouble walking and Weakness. Psychiatric Not Present- Anxiety, Bipolar, Change in Sleep Pattern, Depression, Fearful and Frequent crying. Endocrine Not Present- Cold Intolerance, Excessive Hunger, Hair Changes, Heat Intolerance, Hot flashes and New Diabetes. Hematology Present- Blood Thinners. Not Present- Easy Bruising,  Excessive bleeding, Gland problems, HIV and Persistent Infections.  Vitals (Sabrina Canty CMA; 03/30/2019 10:39 AM) 03/30/2019 10:38 AM Weight: 205 lb Height: 75in Body Surface Area: 2.22 m Body Mass Index: 25.62 kg/m  Temp.: 97.42F(Temporal)  Pulse: 99 (Regular)  BP: 156/84 (Sitting, Left Arm, Standard)   Physical Exam  General: WN tall WM who is alert and generally healthy appearing. He is wearing a mask. Skin: Inspection and palpation of the skin unremarkable.  Eyes: Conjunctivae white, pupils equal. Face, ears, nose, mouth, and throat: Face - wearing a mask  Neck: Supple. No mass. Trachea midline. Lymph Nodes: No supraclavicular or cervical adenopathy. No axillary adenopathy.  Lungs: Normal respiratory effort. Clear to auscultation and symmetric breath sounds. Cardiovascular: Regular rate and rhythm. Normal auscultation of the heart. No murmur or rub.  Abdomen: Soft. No mass. Liver and spleen not palpable. No tenderness. Normal bowel sounds.  He has a moderate right inguinal hernia that is reducible. I do not feel a hernia on the left side. Rectal: Not done.  Musculoskeletal/extremities: Normal gait. Good strength and ROM in upper and lower extremities.  Neurologic: Grossly intact to motor and sensory function. Psychiatric: Has normal mood and affect. Judgement and insight appear normal.    Assessment & Plan  1.  INGUINAL HERNIA OF RIGHT SIDE WITHOUT OBSTRUCTION OR GANGRENE (K40.90)  Plan:  1)    From Sande Rives, [place] - 04/05/2019: Per Dr. Rayann Heman, patient at acceptable risk for planned procedure. No further cardiovascular testing needed at this time. Dr. Rayann Heman has recommended holding Xarelto 24 hours prior to procedure and restarting as soon as able post operatively.   2)  Open repair of right inguinal hernia  2.  ANTICOAGULATED (Z79.01)  On  Xarelto 3. A Fib - chronic and long standing Sees Dr. Rayann Heman  4. HTN 5. History of stroke -  2017 No residual changes 6. 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 Multinodular goiter followed by Dr. Dwyane Dee 8.  History of prostate ca  Robotic prostatectomy - 09/15/2011 - Tomi Bamberger, MD, [facility] Surgery Office phone:  [phone]

## 2019-06-15 NOTE — Progress Notes (Signed)
Anesthesia Chart Review:  Case: [id number] Date/Time: 06/16/19 1145   Procedure: OPEN RIGHT INGUINAL HERNIA REPAIR (Right )   Anesthesia type: General   Pre-op diagnosis: RIGHT INGUINAL HERNIA   Location: [place] OR ROOM 19 / [place] [facility]   Surgeon: Alphonsa Overall, MD      DISCUSSION: Patient is a 78 year old male scheduled for the above procedure.  History includes former smoker (quit 1970), HTN, afib, prostate cancer (s/p robotic assisted laparoscopic radical prostatectomy 09/15/11), HLD, peripheral neuropathy, CVA (occluded right M2 branch supplying right parietal lobe 05/14/16 CTA head), CKD (stage III).   Preoperative cardiology input per Dr. Rayann Heman: "Proceed with surgery, holding on 24 hours of xarelto prior to surgery and restarting as soon as able post operatively" Last Xarelto 06/13/19.  Presurgical Covid-19 test negative on 06/14/2019.  He is for labs and anesthesia team evaluation on the day of surgery.   VS:  BP Readings from Last 3 Encounters:  04/27/19 132/88  03/29/19 128/70  03/23/19 121/70   Pulse Readings from Last 3 Encounters:  04/27/19 (!) 55  03/29/19 67  03/23/19 73    PROVIDERS: [name], MD is PCP. Last visit 04/27/19.  Thompson Grayer, MD is EP cardiologist. Last visit 03/29/19 with Tommye Standard, PA-C. One year follow-up recommended. Dutch Gray, MD is urologist Elayne Snare, MD is endocrinologist (thyroid nodule). Last visit 01/05/19 with one year follow-up recommended.   LABS: For day of surgery. As of 03/16/19, Cr 1.36. A1c 6.0% on 03/09/19.    IMAGES: CXR 10/19/18: FINDINGS: Cardiac shadows within normal limits. Minimal basilar density is noted in the lingula likely representing early infiltrate. No sizable effusion is seen. No acute bony abnormality is noted. Degenerative change of the thoracic spine is seen. IMPRESSION: Early infiltrate in the lingula. - S/p treatment with doxycycline.  CTA head/neck 05/14/16: IMPRESSION: - No significant carotid or  vertebral artery stenosis in the neck - Occluded right M2 branch supplying the right parietal lobe in the area of acute infarction. There are well developed collateral vessels in this area. This could be due to an embolus or atherosclerotic stenosis. - Thyroid goiter with 27 mm left thyroid nodule. Thyroid ultrasound Recommended. - (Left thyroid nodule biopsy 11/11/16 consistent with benign follicular nodule)   EKG: EKG 03/29/19 ([facility]): AFib at 67 bpm. Right BBB   CV: Echocardiogram 05/15/16: Study Conclusions - Left ventricle: The cavity size was normal. Wall thickness was   increased in a pattern of mild LVH. Systolic function was normal.   The estimated ejection fraction was in the range of 55% to 60%.   Wall motion was normal; there were no regional wall motion   abnormalities. - Mitral valve: Calcified annulus. There was mild regurgitation. - Left atrium: The atrium was severely dilated. - Right ventricle: The cavity size was mildly dilated. - Right atrium: The atrium was severely dilated. Impressions: - Normal LV systolic function; mild LVH; severe biatrial   enlargement; mild RVE; mild MR; trace TR.   Carotid US 12/22/14: Impression: Heterogeneous plaque, bilaterally.  1-39% bilateral ICA stenosis.  Normal subclavian arteries, bilaterally.  Patent vertebral arteries with antegrade flow.   Past Medical History:  Diagnosis Date  . Adenomatous colon polyp   . Arthritis 09-10-11   rt. hip, knee, hands  . Cancer ([place]) 09-10-11   dx. Prostate cancer-bx. done 12'12, surgery planned  . Cataract   . CKD (chronic kidney disease)    stage III (09/01/18)  . Diverticulosis   . Hemorrhoid   . Hx  of adenomatous colonic polyps 06/05/2011  . Hyperlipidemia   . Hypertension   . 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 etiology  . Peripheral neuropathy   . Permanent atrial fibrillation ([place]) 09/10/2011  . Prostate cancer ([place])   . Stroke [facility])     Past Surgical  History:  Procedure Laterality Date  . COLONOSCOPY  04/2003, 06/05/2011   diverticulosis, internal and external hemorrhoids 2004 and 2012, 2 small polyps 2012  . EYE SURGERY  09-10-11   Only stitches to close cut, and observation of hematoma from injury, cataracts bilateral  . ROBOT ASSISTED LAPAROSCOPIC RADICAL PROSTATECTOMY  09/15/2011   Procedure: ROBOTIC ASSISTED LAPAROSCOPIC RADICAL PROSTATECTOMY LEVEL 2;  Surgeon: Dutch Gray, MD;  Location: [facility];  Service: Urology;  Laterality: N/A;        . TONSILLECTOMY      MEDICATIONS: No current facility-administered medications for this encounter.    Marland Kitchen acetaminophen (TYLENOL) 650 MG CR tablet  . cholecalciferol (VITAMIN D3) 25 MCG (1000 UT) tablet  . famotidine (PEPCID) 20 MG tablet  . metoprolol succinate (TOPROL-XL) 25 MG 24 hr tablet  . Multiple Vitamin (MULTIVITAMIN WITH MINERALS) TABS tablet  . simvastatin (ZOCOR) 20 MG tablet  . XARELTO 20 MG TABS tablet     Myra Gianotti, PA-C Surgical Short Stay/Anesthesiology [facility] Phone [phone] [facility] Phone [phone] 06/15/2019 11:16 AM

## 2019-06-16 ENCOUNTER — Encounter (HOSPITAL_COMMUNITY)
Admission: RE | Disposition: A | Discharge status: Home / Self Care | Payer: Self-pay | Source: Home / Self Care | Attending: Surgery

## 2019-06-16 ENCOUNTER — Ambulatory Visit (HOSPITAL_COMMUNITY): Payer: Medicare Other | Admitting: Vascular Surgery

## 2019-06-16 ENCOUNTER — Encounter (HOSPITAL_COMMUNITY): Payer: Self-pay | Admitting: Orthopedic Surgery

## 2019-06-16 ENCOUNTER — Ambulatory Visit (HOSPITAL_COMMUNITY)
Admission: RE | Admit: 2019-06-16 | Discharge: 2019-06-16 | Disposition: A | Discharge status: Home / Self Care | Payer: Medicare Other | Attending: Surgery | Admitting: Surgery

## 2019-06-16 ENCOUNTER — Other Ambulatory Visit: Payer: Self-pay | Admitting: Surgery

## 2019-06-16 DIAGNOSIS — N183 Chronic kidney disease, stage 3 unspecified: Secondary | ICD-10-CM | POA: Diagnosis not present

## 2019-06-16 DIAGNOSIS — K4090 Unilateral inguinal hernia, without obstruction or gangrene, not specified as recurrent: Secondary | ICD-10-CM | POA: Insufficient documentation

## 2019-06-16 DIAGNOSIS — Z87891 Personal history of nicotine dependence: Secondary | ICD-10-CM | POA: Insufficient documentation

## 2019-06-16 DIAGNOSIS — Z8546 Personal history of malignant neoplasm of prostate: Secondary | ICD-10-CM | POA: Insufficient documentation

## 2019-06-16 DIAGNOSIS — Z79899 Other long term (current) drug therapy: Secondary | ICD-10-CM | POA: Diagnosis not present

## 2019-06-16 DIAGNOSIS — K219 Gastro-esophageal reflux disease without esophagitis: Secondary | ICD-10-CM | POA: Insufficient documentation

## 2019-06-16 DIAGNOSIS — E785 Hyperlipidemia, unspecified: Secondary | ICD-10-CM | POA: Diagnosis not present

## 2019-06-16 DIAGNOSIS — I4821 Permanent atrial fibrillation: Secondary | ICD-10-CM | POA: Insufficient documentation

## 2019-06-16 DIAGNOSIS — I129 Hypertensive chronic kidney disease with stage 1 through stage 4 chronic kidney disease, or unspecified chronic kidney disease: Secondary | ICD-10-CM | POA: Insufficient documentation

## 2019-06-16 DIAGNOSIS — Z7901 Long term (current) use of anticoagulants: Secondary | ICD-10-CM | POA: Diagnosis not present

## 2019-06-16 DIAGNOSIS — Z8673 Personal history of transient ischemic attack (TIA), and cerebral infarction without residual deficits: Secondary | ICD-10-CM | POA: Diagnosis not present

## 2019-06-16 HISTORY — PX: INGUINAL HERNIA REPAIR: SHX194

## 2019-06-16 HISTORY — DX: Chronic kidney disease, unspecified: N18.9

## 2019-06-16 LAB — CBC
HCT: 49.6 % (ref 39.0–52.0)
Hemoglobin: 16.4 g/dL (ref 13.0–17.0)
MCH: 32.1 pg (ref 26.0–34.0)
MCHC: 33.1 g/dL (ref 30.0–36.0)
MCV: 97.1 fL (ref 80.0–100.0)
Platelets: 210 10*3/uL (ref 150–400)
RBC: 5.11 MIL/uL (ref 4.22–5.81)
RDW: 12.9 % (ref 11.5–15.5)
WBC: 7.7 10*3/uL (ref 4.0–10.5)
nRBC: 0 % (ref 0.0–0.2)

## 2019-06-16 LAB — BASIC METABOLIC PANEL
Anion gap: 9 (ref 5–15)
BUN: 21 mg/dL (ref 8–23)
CO2: 22 mmol/L (ref 22–32)
Calcium: 9.5 mg/dL (ref 8.9–10.3)
Chloride: 110 mmol/L (ref 98–111)
Creatinine, Ser: 1.37 mg/dL — ABNORMAL HIGH (ref 0.61–1.24)
GFR calc Af Amer: 57 mL/min — ABNORMAL LOW (ref >60)
GFR calc non Af Amer: 49 mL/min — ABNORMAL LOW (ref >60)
Glucose, Bld: 96 mg/dL (ref 70–99)
Potassium: 4.4 mmol/L (ref 3.5–5.1)
Sodium: 141 mmol/L (ref 135–145)

## 2019-06-16 LAB — PROTIME-INR
INR: 1.1 (ref 0.8–1.2)
Prothrombin Time: 14.1 seconds (ref 11.4–15.2)

## 2019-06-16 SURGERY — REPAIR, HERNIA, INGUINAL, ADULT
Anesthesia: General | Site: Inguinal | Laterality: Right

## 2019-06-16 MED ORDER — OXYCODONE HCL 5 MG PO TABS: 5.0000 mg | ORAL_TABLET | Freq: Four times a day (QID) | ORAL | 0 refills | Status: DC | PRN

## 2019-06-16 MED ORDER — ACETAMINOPHEN 500 MG PO TABS
1000.0000 mg | ORAL_TABLET | ORAL | Status: AC
  Administered 2019-06-16: 1000 mg via ORAL

## 2019-06-16 MED ORDER — FENTANYL CITRATE (PF) 250 MCG/5ML IJ SOLN
INTRAMUSCULAR | Status: AC
  Filled 2019-06-16: qty 5

## 2019-06-16 MED ORDER — CHLORHEXIDINE GLUCONATE CLOTH 2 % EX PADS: 6.0000 | MEDICATED_PAD | Freq: Once | CUTANEOUS | Status: DC

## 2019-06-16 MED ORDER — LIDOCAINE 2% (20 MG/ML) 5 ML SYRINGE
INTRAMUSCULAR | Status: DC | PRN
  Administered 2019-06-16: 85 mg via INTRAVENOUS

## 2019-06-16 MED ORDER — BUPIVACAINE HCL (PF) 0.25 % IJ SOLN
INTRAMUSCULAR | Status: AC
  Filled 2019-06-16: qty 30

## 2019-06-16 MED ORDER — PROPOFOL 10 MG/ML IV BOLUS
INTRAVENOUS | Status: AC
  Filled 2019-06-16: qty 20

## 2019-06-16 MED ORDER — FENTANYL CITRATE (PF) 100 MCG/2ML IJ SOLN: 25.0000 ug | INTRAMUSCULAR | Status: DC | PRN

## 2019-06-16 MED ORDER — ACETAMINOPHEN 500 MG PO TABS
ORAL_TABLET | ORAL | Status: AC
  Filled 2019-06-16: qty 2

## 2019-06-16 MED ORDER — CEFAZOLIN SODIUM-DEXTROSE 2-4 GM/100ML-% IV SOLN
INTRAVENOUS | Status: AC
  Filled 2019-06-16: qty 100

## 2019-06-16 MED ORDER — ONDANSETRON HCL 4 MG/2ML IJ SOLN
INTRAMUSCULAR | Status: DC | PRN
  Administered 2019-06-16: 4 mg via INTRAVENOUS

## 2019-06-16 MED ORDER — LACTATED RINGERS IV SOLN
INTRAVENOUS | Status: DC | PRN
  Administered 2019-06-16 (×2): via INTRAVENOUS

## 2019-06-16 MED ORDER — BUPIVACAINE LIPOSOME 1.3 % IJ SUSP
INTRAMUSCULAR | Status: DC | PRN
  Administered 2019-06-16: 20 mL

## 2019-06-16 MED ORDER — BUPIVACAINE LIPOSOME 1.3 % IJ SUSP
20.0000 mL | Freq: Once | INTRAMUSCULAR | Status: DC
  Filled 2019-06-16: qty 20

## 2019-06-16 MED ORDER — CEFAZOLIN SODIUM-DEXTROSE 2-4 GM/100ML-% IV SOLN
2.0000 g | INTRAVENOUS | Status: AC
  Administered 2019-06-16: 2 g via INTRAVENOUS

## 2019-06-16 MED ORDER — EPHEDRINE 5 MG/ML INJ
INTRAVENOUS | Status: AC
  Filled 2019-06-16: qty 10

## 2019-06-16 MED ORDER — 0.9 % SODIUM CHLORIDE (POUR BTL) OPTIME
TOPICAL | Status: DC | PRN
  Administered 2019-06-16: 1000 mL

## 2019-06-16 MED ORDER — PROPOFOL 10 MG/ML IV BOLUS
INTRAVENOUS | Status: DC | PRN
  Administered 2019-06-16: 190 mg via INTRAVENOUS

## 2019-06-16 MED ORDER — FENTANYL CITRATE (PF) 100 MCG/2ML IJ SOLN
INTRAMUSCULAR | Status: DC | PRN
  Administered 2019-06-16: 75 ug via INTRAVENOUS
  Administered 2019-06-16: 25 ug via INTRAVENOUS
  Administered 2019-06-16: 50 ug via INTRAVENOUS
  Administered 2019-06-16: 25 ug via INTRAVENOUS

## 2019-06-16 MED ORDER — EPHEDRINE SULFATE 50 MG/ML IJ SOLN
INTRAMUSCULAR | Status: DC | PRN
  Administered 2019-06-16 (×3): 5 mg via INTRAVENOUS

## 2019-06-16 MED ORDER — BUPIVACAINE HCL (PF) 0.25 % IJ SOLN
INTRAMUSCULAR | Status: DC | PRN
  Administered 2019-06-16: 20 mL

## 2019-06-16 SURGICAL SUPPLY — 41 items
ADH SKN CLS APL DERMABOND .7 (GAUZE/BANDAGES/DRESSINGS) ×1
APL PRP STRL LF DISP 70% ISPRP (MISCELLANEOUS) ×1
BLADE CLIPPER SURG (BLADE) IMPLANT
CHLORAPREP W/TINT 26 (MISCELLANEOUS) ×3 IMPLANT
COVER SURGICAL LIGHT HANDLE (MISCELLANEOUS) ×3 IMPLANT
DECANTER SPIKE VIAL GLASS SM (MISCELLANEOUS) ×3 IMPLANT
DERMABOND ADVANCED (GAUZE/BANDAGES/DRESSINGS) ×2
DERMABOND ADVANCED .7 DNX12 (GAUZE/BANDAGES/DRESSINGS) ×1 IMPLANT
DRAIN PENROSE 1/2X12 LTX STRL (WOUND CARE) ×2 IMPLANT
DRAPE LAPAROTOMY TRNSV 102X78 (DRAPES) ×3 IMPLANT
ELECT CAUTERY BLADE 6.4 (BLADE) ×3 IMPLANT
ELECT REM PT RETURN 9FT ADLT (ELECTROSURGICAL) ×3
ELECTRODE REM PT RTRN 9FT ADLT (ELECTROSURGICAL) ×1 IMPLANT
GLOVE SURG SYN 7.5  E (GLOVE) ×2
GLOVE SURG SYN 7.5 E (GLOVE) ×1 IMPLANT
GLOVE SURG SYN 7.5 PF PI (GLOVE) ×1 IMPLANT
GOWN STRL REUS W/ TWL LRG LVL3 (GOWN DISPOSABLE) ×1 IMPLANT
GOWN STRL REUS W/ TWL XL LVL3 (GOWN DISPOSABLE) ×1 IMPLANT
GOWN STRL REUS W/TWL LRG LVL3 (GOWN DISPOSABLE) ×6
GOWN STRL REUS W/TWL XL LVL3 (GOWN DISPOSABLE) ×3
KIT BASIN OR (CUSTOM PROCEDURE TRAY) ×3 IMPLANT
KIT TURNOVER KIT B (KITS) ×3 IMPLANT
MESH ULTRAPRO 3X6 7.6X15CM (Mesh General) ×2 IMPLANT
NDL HYPO 25GX1X1/2 BEV (NEEDLE) ×1 IMPLANT
NEEDLE HYPO 25GX1X1/2 BEV (NEEDLE) ×3 IMPLANT
NS IRRIG 1000ML POUR BTL (IV SOLUTION) ×3 IMPLANT
PACK GENERAL/GYN (CUSTOM PROCEDURE TRAY) ×3 IMPLANT
PAD ARMBOARD 7.5X6 YLW CONV (MISCELLANEOUS) ×3 IMPLANT
PENCIL SMOKE EVACUATOR (MISCELLANEOUS) ×3 IMPLANT
SPECIMEN JAR SMALL (MISCELLANEOUS) IMPLANT
SPONGE INTESTINAL PEANUT (DISPOSABLE) ×3 IMPLANT
SUT MNCRL AB 4-0 PS2 18 (SUTURE) ×3 IMPLANT
SUT NOVA 0 T19/GS 22DT (SUTURE) ×5 IMPLANT
SUT VIC AB 2-0 SH 27 (SUTURE) ×3
SUT VIC AB 2-0 SH 27X BRD (SUTURE) ×1 IMPLANT
SUT VIC AB 3-0 SH 18 (SUTURE) ×3 IMPLANT
SUT VIC AB 3-0 SH 8-18 (SUTURE) ×2 IMPLANT
SUT VICRYL AB 2 0 TIES (SUTURE) ×3 IMPLANT
SYR CONTROL 10ML LL (SYRINGE) ×3 IMPLANT
TOWEL GREEN STERILE (TOWEL DISPOSABLE) ×3 IMPLANT
TOWEL GREEN STERILE FF (TOWEL DISPOSABLE) ×3 IMPLANT

## 2019-06-16 NOTE — Interval H&P Note (Signed)
History and Physical Interval Note:  06/16/2019 12:13 PM  Elijah [name]  has presented today for surgery, with the diagnosis of RIGHT INGUINAL HERNIA.  The various methods of treatment have been discussed with the patient and family.   His wife is here with him today.  After consideration of risks, benefits and other options for treatment, the patient has consented to  Procedure(s) with comments: OPEN RIGHT INGUINAL HERNIA REPAIR (Right) - OPEN RIGHT INGUINAL HERNIA REPAIR as a surgical intervention.  The patient's history has been reviewed, patient examined, no change in status, stable for surgery.  I have reviewed the patient's chart and labs.  Questions were answered to the patient's satisfaction.     Shann Medal

## 2019-06-16 NOTE — Discharge Instructions (Signed)
[facility] - DISCHARGE INSTRUCTIONS TO PATIENT  Activity:  Driving - May drive in 2 to 4 days, if doing well.   Lifting - No lifting more than 15 pounds for 4 weeks.                       Practice your Covid-19 protection:  Wear a mask, social distance, and wash your hands frequently  Wound Care:   Leave the incision dry for 2 days, then you may shower  Diet:  As tolerated  Follow up appointment:  Call Dr. Pollie Friar office [facility]) at [phone] for an appointment in 2 to 4 weeks..  Medications and dosages:  Resume your home medications.  You have a prescription for:  Oxycodone       You may restart your Xarelto tomorrow PM.  Call Dr. Lucia Gaskins or his office  [phone]) if you have:  Temperature greater than 100.4,  Persistent nausea and vomiting,  Severe uncontrolled pain,  Redness, tenderness, or signs of infection (pain, swelling, redness, odor or green/yellow discharge around the site),  Difficulty breathing, headache or visual disturbances,  Any other questions or concerns you may have after discharge.  In an emergency, call 911 or go to an Emergency Department at a nearby hospital.

## 2019-06-16 NOTE — Anesthesia Postprocedure Evaluation (Signed)
Anesthesia Post Note  Patient: Elijah [name]  Procedure(s) Performed: OPEN RIGHT INGUINAL HERNIA REPAIR (Right Inguinal)     Patient location during evaluation: PACU Anesthesia Type: General Level of consciousness: awake Pain management: pain level controlled Vital Signs Assessment: post-procedure vital signs reviewed and stable Respiratory status: spontaneous breathing Cardiovascular status: stable Postop Assessment: no apparent nausea or vomiting Anesthetic complications: no    Last Vitals:  Vitals:   06/16/19 1058 06/16/19 1415  BP: (!) 191/83 (!) 160/83  Pulse:  66  Resp:  12  Temp:  36.6 C  SpO2:  99%    Last Pain:  Vitals:   06/16/19 0943  TempSrc: Oral                 Dimonique Bourdeau

## 2019-06-16 NOTE — Anesthesia Procedure Notes (Signed)
Procedure Name: LMA Insertion Date/Time: 06/16/2019 12:28 PM Performed by: Neldon Newport, CRNA Pre-anesthesia Checklist: Timeout performed, Patient being monitored, Suction available, Emergency Drugs available and Patient identified Patient Re-evaluated:Patient Re-evaluated prior to induction Oxygen Delivery Method: Circle system utilized Preoxygenation: Pre-oxygenation with 100% oxygen Induction Type: IV induction Ventilation: Mask ventilation without difficulty LMA: LMA inserted LMA Size: 5.0 Number of attempts: 1 Placement Confirmation: positive ETCO2 and breath sounds checked- equal and bilateral Tube secured with: Tape Dental Injury: Teeth and Oropharynx as per pre-operative assessment

## 2019-06-16 NOTE — Transfer of Care (Signed)
Immediate Anesthesia Transfer of Care Note  Patient: Elijah [name]  Procedure(s) Performed: OPEN RIGHT INGUINAL HERNIA REPAIR (Right Inguinal)  Patient Location: PACU  Anesthesia Type:General  Level of Consciousness: awake, alert  and oriented  Airway & Oxygen Therapy: Patient Spontanous Breathing and Patient connected to nasal cannula oxygen  Post-op Assessment: Report given to RN, Post -op Vital signs reviewed and stable and Patient moving all extremities X 4  Post vital signs: Reviewed and stable  Last Vitals:  Vitals Value Taken Time  BP 160/83 06/16/19 1415  Temp    Pulse 66 06/16/19 1416  Resp 16 06/16/19 1416  SpO2 99 % 06/16/19 1416  Vitals shown include unvalidated device data.  Last Pain:  Vitals:   06/16/19 0943  TempSrc: Oral         Complications: No apparent anesthesia complications

## 2019-06-16 NOTE — Progress Notes (Signed)
Paged Dr. Lucia Gaskins for orders.

## 2019-06-16 NOTE — Op Note (Signed)
06/16/2019  2:10 PM  PATIENT:  Elijah [name], 78 y.o., male, MRN: [id number]  PREOP DIAGNOSIS:  RIGHT INGUINAL HERNIA  POSTOP DIAGNOSIS:   Indirect right inguinal hernia  PROCEDURE:   Procedure(s):  OPEN RIGHT INGUINAL HERNIA REPAIR  SURGEON:   Alphonsa Overall, M.D.  ANESTHESIA:   general  Anesthesiologist: Belinda Block, MD CRNA: Neldon Newport, CRNA  General  EBL:  minimal  ml  LOCAL MEDICATIONS USED:   20 cc of Exparel and 20 cc of 1/4% marcaine  SPECIMEN:   None  COUNTS CORRECT:  YES  INDICATIONS FOR PROCEDURE:  [name] is a 78 y.o. (DOB: 07/19/1941) white male whose primary care physician is [name], MD and comes for repair of a right inguinal hernia.   The indications and risks of the hernia surgery were explained to the patient.  The risks include, but are not limited to, infection, bleeding, recurrence of the hernia, and nerve injury.  Operative Note: The patient was taken to room number 19 at [place].  He underwent a general anesthesia.    A time out was held and the surgical checklist run.  His lower abdomen was shaved and then prepped with chloroprep.  A right inguinal incision was made through the subcutaneous fat to the external oblique fascia.  The external ring was opened and the cord structures encircled with a penrose drain.  The patient had a right hernia.  He had a fairly short distance between the internal ring and external ring.  There was chronic scarring of the cord structures at the external ring.   I identified the ileo inguinal nerve and preserved this during the dissection.  The patient had a hernia sac the came along the anterior medial portion of the cord structures.  The sac was dissected free of the cord structures.  The hernia sac was about 6 cm in length.  The sac was opened and went to the peritoneal cavity.  There was no palpable mass within the peritoneal cavity.  The sac was ligated with a 2-0 Vicryl suture.  The  inguinal floor was repaired with a 3 x 6 inch piece of Ultrapro mesh.  The mesh was cut to fit the inguinal floor.  The mesh was sewn in place with interrupted 0 Novafil suture.  I sewed the mesh medially to the pubic tubercle, inferiorly to the shelving edge of the inguinal ligament, and superiorly to the transversalis fascia.   A key hole was made for the internal ring.  The mesh lay flat.  The inguinal floor was covered and the internal ring recreated.  The cord structures were returned to a normal location.  The external oblique was closed with a 3-0 vicryl.  The fascia and subcutaneous tissues were infiltrated with 1/4% marcaine plain.  The skin was closed with 4-0 monocryl and painted with DermaBond. The sponge and needle count were correct at the end of the case.  The patient was transported to the recovery room in good condition.  The patient will go home today.  Discharge instructions reviewed.  Alphonsa Overall, MD, [facility] Surgery Pager: [phone] Office phone:  [phone]

## 2019-06-17 ENCOUNTER — Encounter (HOSPITAL_COMMUNITY): Payer: Self-pay | Admitting: Surgery

## 2019-07-13 DIAGNOSIS — H524 Presbyopia: Secondary | ICD-10-CM | POA: Diagnosis not present

## 2019-07-13 DIAGNOSIS — Z961 Presence of intraocular lens: Secondary | ICD-10-CM | POA: Diagnosis not present

## 2019-07-13 DIAGNOSIS — H43393 Other vitreous opacities, bilateral: Secondary | ICD-10-CM | POA: Diagnosis not present

## 2019-07-13 DIAGNOSIS — H52223 Regular astigmatism, bilateral: Secondary | ICD-10-CM | POA: Diagnosis not present

## 2019-07-13 DIAGNOSIS — H5201 Hypermetropia, right eye: Secondary | ICD-10-CM | POA: Diagnosis not present

## 2019-07-13 DIAGNOSIS — Z9849 Cataract extraction status, unspecified eye: Secondary | ICD-10-CM | POA: Diagnosis not present

## 2019-08-08 ENCOUNTER — Telehealth: Payer: Self-pay | Admitting: Family Medicine

## 2019-08-08 NOTE — Telephone Encounter (Signed)
I left a message asking the patient and his wife to call and schedule Medicare AWV with Loma Sousa ([place]).  If patient calls back, please schedule Medicare Wellness Visit at next available opening. Last AWV 03/16/18 [facility] ([place])

## 2019-08-23 ENCOUNTER — Other Ambulatory Visit: Payer: Self-pay | Admitting: Family Medicine

## 2019-08-24 DIAGNOSIS — Z08 Encounter for follow-up examination after completed treatment for malignant neoplasm: Secondary | ICD-10-CM | POA: Diagnosis not present

## 2019-08-24 DIAGNOSIS — Z1283 Encounter for screening for malignant neoplasm of skin: Secondary | ICD-10-CM | POA: Diagnosis not present

## 2019-08-24 DIAGNOSIS — Z85820 Personal history of malignant melanoma of skin: Secondary | ICD-10-CM | POA: Diagnosis not present

## 2019-08-24 DIAGNOSIS — L820 Inflamed seborrheic keratosis: Secondary | ICD-10-CM | POA: Diagnosis not present

## 2019-08-31 DIAGNOSIS — C61 Malignant neoplasm of prostate: Secondary | ICD-10-CM | POA: Diagnosis not present

## 2019-09-07 DIAGNOSIS — Z8546 Personal history of malignant neoplasm of prostate: Secondary | ICD-10-CM | POA: Diagnosis not present

## 2019-09-07 DIAGNOSIS — N393 Stress incontinence (female) (male): Secondary | ICD-10-CM | POA: Diagnosis not present

## 2019-09-13 NOTE — Progress Notes (Signed)
Phone [phone] In person visit   Subjective:   Elijah [name] is a 79 y.o. year old very pleasant male patient who presents for/with See problem oriented charting Chief Complaint  Patient presents with  . Hypertension  . Hyperlipidemia   This visit occurred during the SARS-CoV-2 public health emergency.  Safety protocols were in place, including screening questions prior to the visit, additional usage of staff PPE, and extensive cleaning of exam room while observing appropriate contact time as indicated for disinfecting solutions.   Past Medical History-  Patient Active Problem List   Diagnosis Date Noted  . History of CVA (cerebrovascular accident) 05/14/2016    Priority: High  . History of melanoma 12/04/2015    Priority: High  . Atrial fibrillation ([place]) 04/06/2007    Priority: High  . Hyperglycemia 08/13/2016    Priority: Medium  . Thyroid nodule     Priority: Medium  . GERD (gastroesophageal reflux disease) 12/04/2015    Priority: Medium  . CKD (chronic kidney disease), stage III 12/07/2014    Priority: Medium  . History of prostate cancer 09/23/2012    Priority: Medium  . 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 Essential hypertension 04/07/2007    Priority: Medium  . Hyperlipidemia 04/06/2007    Priority: Medium  . Double vision 12/14/2017    Priority: Low  . Gross hematuria 02/12/2017    Priority: Low  . Osteoarthritis of left hip 08/09/2014    Priority: Low  . Actinic keratosis 06/05/2014    Priority: Low  . Hx of adenomatous colonic polyps 06/05/2011    Priority: Low    Medications- reviewed and updated Current Outpatient Medications  Medication Sig Dispense Refill  . acetaminophen (TYLENOL) 650 MG CR tablet Take 650 mg by mouth 2 (two) times daily.    . cholecalciferol (VITAMIN D3) 25 MCG (1000 UT) tablet Take 1,000 Units by mouth 2 (two) times daily.    . famotidine (PEPCID) 20 MG tablet TAKE 1 TABLET(20  MG) BY MOUTH TWICE DAILY (Patient taking differently: Take 20 mg by mouth 2 (two) times daily. ) 180 tablet 3  . metoprolol succinate (TOPROL-XL) 25 MG 24 hr tablet TAKE 1 TABLET BY MOUTH DAILY WITH OR IMMEDIATELY FOLLOWING A MEAL (Patient taking differently: Take 25 mg by mouth daily. ) 90 tablet 3  . Multiple Vitamin (MULTIVITAMIN WITH MINERALS) TABS tablet Take 1 tablet by mouth daily.    . simvastatin (ZOCOR) 20 MG tablet TAKE 1 TABLET BY MOUTH EVERY DAY 90 tablet 1  . XARELTO 20 MG TABS tablet TAKE 1 TABLET BY MOUTH EVERY DAY 90 tablet 0   No current facility-administered medications for this visit.     Objective:  BP 124/78   Pulse 65   Temp 98 F (36.7 C) (Temporal)   Ht 6\' 3"  (1.905 m)   Wt 204 lb 12.8 oz (92.9 kg)   SpO2 97%   BMI 25.60 kg/m  Gen: NAD, resting comfortably CV: RRR no murmurs rubs or gallops Lungs: CTAB no crackles, wheeze, rhonchi Ext: no edema Skin: warm, dry    Assessment and Plan    #Atrial fibrillation S: Patient compliant with metoprolol 25 mg extended release for rate control.  Also uses Xarelto 20 mg for anticoagulation. Patient denies any issues or missed doses.  A/P: well controlled- continue current meds  -Have to monitor GFR and if consistently below 50 may reduce Xarelto to 15 mg   #Hyperlipidemia/history of stroke with LDL goal  under 70 though likely was embolic S: Compliant with simvastatin 20 mg.  LDL most recently at goal under 70 Lab Results  Component Value Date   CHOL 133 09/01/2018   HDL 33.30 (L) 09/01/2018   LDLCALC 68 09/01/2018   LDLDIRECT 74.0 03/09/2019   TRIG 158.0 (H) 09/01/2018   CHOLHDL 4 09/01/2018   A/P: Reasonable control on last check-update lipid panel today   #Hypertension S: Compliant with metoprolol though primarily for rate control. He does not check blood pressures at home. Denies any sob, chest pain or swelling of legs. Walks over an hour a day with no issues.  BP Readings from Last 3 Encounters:   09/14/19 124/78  06/16/19 (!) 167/98  04/27/19 132/88   A/P: Good control of blood pressure today-continue current medications    #CKD stage III S: GFR has been in the 50s.  Patient knows to avoid NSAIDs-also particularly with anticoagulation- only takes tylenol.  Would prefer H2 blockers over PPI-he has tolerated famotidine A/P: hopefully stable- update cmp    % Update TSH with labs due to prior thyroid nodule though had benign biopsy in 2018. Follows with Dr. Dwyane Dee  % Hyperglycemia- has had elevated A1c in the past and will update this with labs today.  He is 5 pounds down from his August visit-congratulated on weight loss- around 200 on home scales. Down from 230 on home scales to 200 on home scales with efforts- eating 2 meals a day Lab Results  Component Value Date   HGBA1C 6.0 03/09/2019   HGBA1C 5.9 09/01/2018   HGBA1C 6.1 02/24/2018    % Colon cancer screening-fearful of stroke off of anticoagulation.  Fortunately he was able to complete colonoscopy March 23, 2019.  Dr. Carlean Purl in letter on April 22, 2019 recommended no further colonoscopies   % History of melanoma-continues close follow-up with dermatology    % History of prostate cancer-still following with Dr. Alinda Money after robotic prostatectomy-recheck PSAs. Just saw them last week  #dealing with some joint pain and continued neuropathy  Recommended follow up: Return in about 6 months (around 03/13/2020) for annual wellness visit or sooner if needed. Future Appointments  Date Time Provider [place]  12/28/2019  1:45 PM [facility] None  01/04/2020  1:00 PM Elayne Snare, MD [facility] None   Lab/Order associations:   ICD-10-CM   1. Permanent atrial fibrillation (HCC)  I48.21   2. Hyperlipidemia, unspecified hyperlipidemia type  E78.5 CBC with Differential/Platelet    Comprehensive metabolic panel    Lipid panel  3. Thyroid nodule  E04.1 TSH  4. Hyperglycemia  R73.9 Hemoglobin A1c  5.  Gastroesophageal reflux disease without esophagitis  K21.9   6. Hx of adenomatous colonic polyps  Z86.010    Return precautions advised.  [name], MD

## 2019-09-14 ENCOUNTER — Ambulatory Visit (INDEPENDENT_AMBULATORY_CARE_PROVIDER_SITE_OTHER): Payer: Medicare Other | Admitting: Family Medicine

## 2019-09-14 ENCOUNTER — Encounter: Payer: Self-pay | Admitting: Family Medicine

## 2019-09-14 ENCOUNTER — Other Ambulatory Visit: Payer: Self-pay

## 2019-09-14 VITALS — BP 124/78 | HR 65 | Temp 98.0°F | Ht 75.0 in | Wt 204.8 lb

## 2019-09-14 DIAGNOSIS — E041 Nontoxic single thyroid nodule: Secondary | ICD-10-CM

## 2019-09-14 DIAGNOSIS — K219 Gastro-esophageal reflux disease without esophagitis: Secondary | ICD-10-CM | POA: Diagnosis not present

## 2019-09-14 DIAGNOSIS — Z0000 Encounter for general adult medical examination without abnormal findings: Secondary | ICD-10-CM | POA: Diagnosis not present

## 2019-09-14 DIAGNOSIS — Z860101 Personal history of adenomatous and serrated colon polyps: Secondary | ICD-10-CM

## 2019-09-14 DIAGNOSIS — R739 Hyperglycemia, unspecified: Secondary | ICD-10-CM

## 2019-09-14 DIAGNOSIS — E785 Hyperlipidemia, unspecified: Secondary | ICD-10-CM

## 2019-09-14 DIAGNOSIS — I4821 Permanent atrial fibrillation: Secondary | ICD-10-CM

## 2019-09-14 DIAGNOSIS — Z86010 Personal history of colonic polyps: Secondary | ICD-10-CM | POA: Diagnosis not present

## 2019-09-14 LAB — COMPREHENSIVE METABOLIC PANEL
ALT: 22 U/L (ref 0–53)
AST: 19 U/L (ref 0–37)
Albumin: 3.9 g/dL (ref 3.5–5.2)
Alkaline Phosphatase: 87 U/L (ref 39–117)
BUN: 27 mg/dL — ABNORMAL HIGH (ref 6–23)
CO2: 27 mEq/L (ref 19–32)
Calcium: 9.4 mg/dL (ref 8.4–10.5)
Chloride: 110 mEq/L (ref 96–112)
Creatinine, Ser: 1.45 mg/dL (ref 0.40–1.50)
GFR: 47.04 mL/min — ABNORMAL LOW (ref >60.00)
Glucose, Bld: 79 mg/dL (ref 70–99)
Potassium: 5.6 mEq/L — ABNORMAL HIGH (ref 3.5–5.1)
Sodium: 142 mEq/L (ref 135–145)
Total Bilirubin: 0.6 mg/dL (ref 0.2–1.2)
Total Protein: 6.3 g/dL (ref 6.0–8.3)

## 2019-09-14 LAB — HEMOGLOBIN A1C: Hgb A1c MFr Bld: 6.1 % (ref 4.6–6.5)

## 2019-09-14 LAB — CBC WITH DIFFERENTIAL/PLATELET
Basophils Absolute: 0.1 10*3/uL (ref 0.0–0.1)
Basophils Relative: 1.3 % (ref 0.0–3.0)
Eosinophils Absolute: 0.2 10*3/uL (ref 0.0–0.7)
Eosinophils Relative: 3.2 % (ref 0.0–5.0)
HCT: 45.8 % (ref 39.0–52.0)
Hemoglobin: 15.5 g/dL (ref 13.0–17.0)
Lymphocytes Relative: 17.7 % (ref 12.0–46.0)
Lymphs Abs: 1.2 10*3/uL (ref 0.7–4.0)
MCHC: 33.9 g/dL (ref 30.0–36.0)
MCV: 94.1 fl (ref 78.0–100.0)
Monocytes Absolute: 0.6 10*3/uL (ref 0.1–1.0)
Monocytes Relative: 9.6 % (ref 3.0–12.0)
Neutro Abs: 4.5 10*3/uL (ref 1.4–7.7)
Neutrophils Relative %: 68.2 % (ref 43.0–77.0)
Platelets: 187 10*3/uL (ref 150.0–400.0)
RBC: 4.87 Mil/uL (ref 4.22–5.81)
RDW: 13.6 % (ref 11.5–15.5)
WBC: 6.6 10*3/uL (ref 4.0–10.5)

## 2019-09-14 LAB — LIPID PANEL
Cholesterol: 131 mg/dL (ref 0–200)
HDL: 35.7 mg/dL — ABNORMAL LOW (ref >39.00)
LDL Cholesterol: 59 mg/dL (ref 0–99)
NonHDL: 95.4
Total CHOL/HDL Ratio: 4
Triglycerides: 182 mg/dL — ABNORMAL HIGH (ref 0.0–149.0)
VLDL: 36.4 mg/dL (ref 0.0–40.0)

## 2019-09-14 LAB — TSH: TSH: 0.74 u[IU]/mL (ref 0.35–4.50)

## 2019-09-14 NOTE — Progress Notes (Signed)
Potassium is high but he is not on meds to raise potassium that im aware. Does appear slightly dehydrated- have him increase fluids and repeat BMP under hyperkalemia thi sweek if possible.   Kidney function largely stable in chronic kidney disease stage III range. Blood counts normal- no anemia or low platelets.   Cholesterol looks pretty good with bad cholesterol under 70 at 59 which is the goal.   At risk for diabetes with hemoglobin a1c of 6.1 up from 6.0 (at risk from 5.7-6.4). Healthy eating, regular exercise, weight loss advised. If it increases anymore above 6 we could consider metformin to help prevent diabetes.  Thyroid normal

## 2019-09-14 NOTE — Progress Notes (Signed)
Phone: [phone]    Subjective:  Patient presents today for their annual wellness visit (subsequent)  Preventive Screening-Counseling & Management  Modifiable Risk Factors/behavioral risk assessment/psychosocial risk assessment Regular exercise: walking an hour most days Diet: trying to eat healthy foods- does just 2 meals a day. Great job with weight loss over last few years  Wt Readings from Last 3 Encounters:  09/14/19 204 lb 12.8 oz (92.9 kg)  06/16/19 205 lb (93 kg)  04/27/19 206 lb 12.8 oz (93.8 kg)   Smoking Status: former Smoker but quit in 1970s. No regular screening required Second Hand Smoking status: No smokers in home Alcohol intake: 0 per week  Cardiac risk factors:  advanced age (older than 31 for men, 26 for women)  treated Hyperlipidemia  treated Hypertension  No diabetes. Monitoring prediabetes Lab Results  Component Value Date   HGBA1C 6.0 03/09/2019  Family History: heart attack age 1 in dad- still living at 65, paternal grandfather also with heart attack   Depression Screen/risk evaluation Risk factors: none.Marland Kitchen PHQ2 0  Depression screen [facility] 2/9 04/27/2019 03/09/2019 09/01/2018 03/16/2018 02/17/2018  Decreased Interest 0 0 0 0 0  Down, Depressed, Hopeless 0 0 0 0 0  PHQ - 2 Score 0 0 0 0 0  Altered sleeping - - 0 - -  Tired, decreased energy - - 1 - -  Change in appetite - - 0 - -  Feeling bad or failure about yourself  - - 0 - -  Trouble concentrating - - 0 - -  Moving slowly or fidgety/restless - - 0 - -  Suicidal thoughts - - 0 - -  PHQ-9 Score - - 1 - -  Difficult doing work/chores - - Not difficult at all - -    Functional ability and level of safety Mobility assessment:  timed get up and go <12 seconds Activities of Daily Living- Independent in ADLs (toileting, bathing, dressing, transferring, eating) and in IADLs (shopping, housekeeping, managing own medications, and handling finances) Home Safety: Loose rugs (yes- discussed could remove),  smoke detectors (yes), small pets (no), grab bars (no but encouraged), stairs (yes but mainly stays downstairs), life-alert system (uses cell phone) Hearing Difficulties: -patient declines significant issues. Not interested in hearing aids- declines further workup right now Fall Risk: None  Fall Risk  09/14/2019 03/09/2019 09/01/2018 03/16/2018 02/17/2018  Falls in the past year? 0 0 0 No No  Number falls in past yr: 0 0 0 - -  Injury with Fall? 0 0 0 - -  Opioid use history:  no long term opioids use Self assessment of health status: "fair to good"   Cognitive Testing             No reported trouble.    Mini cog: normal clock draw. 3/3 delayed recall. Normal test result   [facility] baby  List the Names of Other Physician/Practitioners you currently use: Patient Care Team: [name], MD as PCP - General (Family Medicine) Thompson Grayer, MD as PCP - Electrophysiology (Cardiology) Gatha Mayer, MD as Consulting Physician (Gastroenterology) Raynelle Bring, MD as Consulting Physician (Urology) Levy Sjogren, MD as Referring Physician (Dermatology) -  Required Immunizations needed today:  Needs covid 19 vaccine- gave info today. Would be candidate for shingrix- want to focus on covid vaccine first Immunization History  Administered Date(s) Administered  . Fluad Quad(high Dose 65+) 04/27/2019  . Influenza Split 05/04/2012  . Influenza, High Dose Seasonal PF 08/26/2017, 06/04/2018  . Pneumococcal Conjugate-13  02/12/2017  . Pneumococcal Polysaccharide-23 07/15/2007  . Td 04/05/2003, 12/11/2017  . Tetanus 06/05/2014   Health Maintenance  Topic Date Due  . Tetanus Vaccine  12/12/2027  . Flu Shot  Completed  . Pneumonia vaccines  Completed   Screening tests-   1. Colon cancer screening- Dr. Carlean Purl with last colonoscopy 2020- was told no further colonoscopies 2. Lung Cancer screening- not a candidate 3. Skin cancer screening- follows with derm every 6-12 months  with istory melanoma 4. Prostate cancer screening- in follow up with Dr. Alinda Money with history prostate cancer Lab Results  Component Value Date   PSA <0.015 08/18/2018   PSA <0.015 08/13/2016   PSA 0.01 (L) 10/18/2013   The following were reviewed and entered/updated in epic if appropriate: Past Medical History:  Diagnosis Date  . Adenomatous colon polyp   . Arthritis 09-10-11   rt. hip, knee, hands  . Cancer ([place]) 09-10-11   dx. Prostate cancer-bx. done 12'12, surgery planned  . Cataract   . CKD (chronic kidney disease)    stage III (09/01/18)  . Diverticulosis   . Hemorrhoid   . Hx of adenomatous colonic polyps 06/05/2011  . Hyperlipidemia   . Hypertension   . 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 etiology  . Peripheral neuropathy   . Permanent atrial fibrillation ([place]) 09/10/2011  . Prostate cancer ([place])   . Stroke [facility])    Patient Active Problem List   Diagnosis Date Noted  . History of CVA (cerebrovascular accident) 05/14/2016    Priority: High  . History of melanoma 12/04/2015    Priority: High  . Atrial fibrillation ([place]) 04/06/2007    Priority: High  . Hyperglycemia 08/13/2016    Priority: Medium  . Thyroid nodule     Priority: Medium  . GERD (gastroesophageal reflux disease) 12/04/2015    Priority: Medium  . CKD (chronic kidney disease), stage III 12/07/2014    Priority: Medium  . History of prostate cancer 09/23/2012    Priority: Medium  . 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 Essential hypertension 04/07/2007    Priority: Medium  . Hyperlipidemia 04/06/2007    Priority: Medium  . Double vision 12/14/2017    Priority: Low  . Gross hematuria 02/12/2017    Priority: Low  . Osteoarthritis of left hip 08/09/2014    Priority: Low  . Actinic keratosis 06/05/2014    Priority: Low  . Hx of adenomatous colonic polyps 06/05/2011    Priority: Low   Past Surgical History:  Procedure Laterality Date  . COLONOSCOPY   04/2003, 06/05/2011   diverticulosis, internal and external hemorrhoids 2004 and 2012, 2 small polyps 2012  . EYE SURGERY  09-10-11   Only stitches to close cut, and observation of hematoma from injury, cataracts bilateral  . INGUINAL HERNIA REPAIR Right 06/16/2019   Procedure: OPEN RIGHT INGUINAL HERNIA REPAIR;  Surgeon: Alphonsa Overall, MD;  Location: [facility];  Service: General;  Laterality: Right;  OPEN RIGHT INGUINAL HERNIA REPAIR  . ROBOT ASSISTED LAPAROSCOPIC RADICAL PROSTATECTOMY  09/15/2011   Procedure: ROBOTIC ASSISTED LAPAROSCOPIC RADICAL PROSTATECTOMY LEVEL 2;  Surgeon: Dutch Gray, MD;  Location: [facility];  Service: Urology;  Laterality: N/A;        . TONSILLECTOMY      Family History  Problem Relation Age of Onset  . Diabetes Mother   . Hypertension Mother   . Diverticulosis Mother   . Heart disease Father 43  . Heart attack Father  9       MI  . Prostate cancer Brother   . Alzheimer's disease Paternal Grandmother   . Heart attack Paternal Grandfather   . Colon cancer Neg Hx     Medications- reviewed and updated Current Outpatient Medications  Medication Sig Dispense Refill  . acetaminophen (TYLENOL) 650 MG CR tablet Take 650 mg by mouth 2 (two) times daily.    . cholecalciferol (VITAMIN D3) 25 MCG (1000 UT) tablet Take 1,000 Units by mouth 2 (two) times daily.    . famotidine (PEPCID) 20 MG tablet TAKE 1 TABLET(20 MG) BY MOUTH TWICE DAILY (Patient taking differently: Take 20 mg by mouth 2 (two) times daily. ) 180 tablet 3  . metoprolol succinate (TOPROL-XL) 25 MG 24 hr tablet TAKE 1 TABLET BY MOUTH DAILY WITH OR IMMEDIATELY FOLLOWING A MEAL (Patient taking differently: Take 25 mg by mouth daily. ) 90 tablet 3  . Multiple Vitamin (MULTIVITAMIN WITH MINERALS) TABS tablet Take 1 tablet by mouth daily.    . simvastatin (ZOCOR) 20 MG tablet TAKE 1 TABLET BY MOUTH EVERY DAY 90 tablet 1  . XARELTO 20 MG TABS tablet TAKE 1 TABLET BY MOUTH EVERY DAY 90 tablet 0   No current  facility-administered medications for this visit.    Allergies-reviewed and updated Allergies  Allergen Reactions  . Ciprofloxacin Nausea Only  . Flagyl [Metronidazole] Nausea Only    Social History   Socioeconomic History  . Marital status: Married    Spouse name: Not on file  . Number of children: 3  . Years of education: Not on file  . Highest education level: Not on file  Occupational History  . Occupation: Surveyor, mining: RETIRED  Tobacco Use  . Smoking status: Former Smoker    Quit date: 08/04/1968    Years since quitting: 51.1  . Smokeless tobacco: Never Used  Substance and Sexual Activity  . Alcohol use: No    Comment: none in 20 yrs  . Drug use: No  . Sexual activity: Yes  Other Topics Concern  . Not on file  Social History Narrative   Married with 3 kids. 6 grandkids.       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       Hobbies: beach (place at [employer]) usually every 2 weeks.    Social Determinants of Health   Financial Resource Strain:   . Difficulty of Paying Living Expenses: Not on file  Food Insecurity:   . Worried About Charity fundraiser in the Last Year: Not on file  . Ran Out of Food in the Last Year: Not on file  Transportation Needs:   . Lack of Transportation (Medical): Not on file  . Lack of Transportation (Non-Medical): Not on file  Physical Activity:   . Days of Exercise per Week: Not on file  . Minutes of Exercise per Session: Not on file  Stress:   . Feeling of Stress : Not on file  Social Connections:   . Frequency of Communication with Friends and Family: Not on file  . Frequency of Social Gatherings with Friends and Family: Not on file  . Attends Religious Services: Not on file  . Active Member of Clubs or Organizations: Not on file  . Attends Archivist Meetings: Not on file  . Marital Status: Not on file      Objective:  BP 124/78   Pulse 65   Temp 98 F (36.7 C) (  Temporal)    Ht 6\' 3"  (1.905 m)   Wt 204 lb 12.8 oz (92.9 kg)   SpO2 97%   BMI 25.60 kg/m  Gen: NAD, resting comfortably   Assessment/Plan:  AWV completed 1. Educated, counseled and referred based on above elements 2. Educated, counseled and referred as appropriate for preventative needs 3. Discussed and documented a written plan for preventiative services and screenings with personalized health advice- After Visit Summary was given to patient which included this plan   Status of chronic or acute concerns  See my separate note today  Recommended follow up: Return in about 6 months (around 03/13/2020) for annual wellness visit or sooner if needed. Future Appointments  Date Time Provider [place]  12/28/2019  1:45 PM [facility] None  01/04/2020  1:00 PM Elayne Snare, MD [facility] None     Lab/Order associations:   ICD-10-CM   1. Preventative health care  Z00.00    Return precautions advised. [name], MD

## 2019-09-14 NOTE — Patient Instructions (Addendum)
Medicare is currently allowing [place] to complete wellness visits by phone-I would love for you to schedule a visit with Elijah George, LPN before you leave-it has been over a year from the last wellness visit If you dont schedule with Elijah [name]- then lets do your 6 month visit with me as an annual wellness visit   Please stop by lab before you go If you do not have mychart- we will call you about results within 5 business days of [place] receiving them.  If you have mychart- we will send your results within 3 business days of [place] receiving them.  If abnormal or we want to clarify a result, we will call or mychart you to make sure you receive the message.  If you have questions or concerns or don't hear within 5-7 days, please send [place] a message or call us.    Recommended follow up: Return in about 6 months (around 03/13/2020) for annual wellness visit or sooner if needed.     Elijah [name] , Thank you for taking time to come for your Medicare Wellness Visit. I appreciate your ongoing commitment to your health goals. Please review the following plan we discussed and let me know if I can assist you in the future.   These are the goals we discussed: Goals    . Exercise 150 min/wk Moderate Activity     Maintain an active lifestyle working out       This is a list of the screening recommended for you and due dates:  Health Maintenance  Topic Date Due  . Tetanus Vaccine  12/12/2027  . Flu Shot  Completed  . Pneumonia vaccines  Completed      Wait list link: [email or web address]  We are committed to keeping you informed about the COVID-19 vaccine.  As the vaccine continues to become available for each phase, we will ensure that patients who meet the criteria receive the information they need to access vaccination opportunities. Continue to check your MyChart account and [email or web address] for updates. Please review the  Phase 1b information below.  [place] On Tuesday, Jan. 19, the [facility]) and [place] began large-scale COVID-19 vaccinations at the [facility]. The vaccinations are appointment only and for those 33 and older.  Walk-ins will not be accepted.  All appointments are currently filled. Please join our waiting list for the next available appointments. We will contact you when appointments become available. Please do not sign up more than once.  Join Our Waiting List  Our daily vaccination capacity will continue to increase, including expansion of our [employer] site, increasing mobile clinics across our service region and plans to open COVID-19 vaccination clinics in [place] and [place] counties.  Other Vaccination Opportunities in [place] We are also working in partnership with county health agencies in our service counties to ensure continuing vaccination availability in the weeks and months ahead. Learn more about each county's vaccination efforts in the website links below:   [place]'s phase 1b vaccination guidelines, prioritizing those 65 and over as the next eligible group to receive the COVID-19 vaccine, are detailed at [email or web address].   Vaccine Safety and Effectiveness Clinical trials for the Pfizer COVID-19 vaccine involved 42,000 people and showed that the vaccine is more than 95% effective in preventing COVID-19 with no serious safety concerns. Similar results have been reported  for the Moderna COVID-19 vaccine. Side effects reported in the [place] clinical trials include a sore arm at the injection site, fatigue, headache, chills and fever. While side effects from the [place] COVID-19 vaccine are higher than for a typical flu vaccine, they are lower in many ways than side effects  from the leading vaccine to prevent shingles. Side effects are signs that a vaccine is working and are related to your immune system being stimulated to produce antibodies against infection. Side effects from vaccination are far less significant than health impacts from COVID-19.  Staying Informed Pharmacists, infectious disease doctors, critical care nurses and other experts at [facility] continue to speak publicly through media interviews and direct communication with our patients and communities about the safety, effectiveness and importance of vaccines to eliminate COVID-19. In addition, reliable information on vaccine safety, effectiveness, side effects and more is available on the following websites:  [place] Department of Health and Human Services COVID-19 Vaccine Information Website.  U.S. Centers for Disease Control and Prevention [id number] Human resources officer.  Staying Safe We agree with the CDC on what we can do to help our communities get back to normal: Getting "back to normal" is going to take all of our tools. If we use all the tools we have, we stand the best chance of getting our families, communities, schools and workplaces "back to normal" sooner:  Get vaccinated as soon as vaccines become available within the phase of the state's vaccination rollout plan for which you meet the eligibility criteria.  Wear a mask.  Stay 6 feet from others and avoid crowds.  Wash hands often.  For our most current information, please visit [email or web address].

## 2019-09-14 NOTE — Addendum Note (Signed)
Addended by: [name] on: 09/14/2019 09:03 AM   Modules accepted: Level of Service

## 2019-09-15 ENCOUNTER — Other Ambulatory Visit: Payer: Self-pay

## 2019-09-15 DIAGNOSIS — E875 Hyperkalemia: Secondary | ICD-10-CM

## 2019-10-03 ENCOUNTER — Ambulatory Visit: Payer: Medicare Other | Attending: Internal Medicine

## 2019-10-03 DIAGNOSIS — Z23 Encounter for immunization: Secondary | ICD-10-CM

## 2019-10-03 NOTE — Progress Notes (Signed)
   Covid-19 Vaccination Clinic  Name:  Elijah [name]    MRN: [id number] DOB: 1941-02-25  10/03/2019  [name] was observed post Covid-19 immunization for 15 minutes without incidence. He was provided with Vaccine Information Sheet and instruction to access the V-Safe system.   [name] was instructed to call 911 with any severe reactions post vaccine: Marland Kitchen Difficulty breathing  . Swelling of your face and throat  . A fast heartbeat  . A bad rash all over your body  . Dizziness and weakness    Immunizations Administered    Name Date Dose VIS Date Route   Pfizer COVID-19 Vaccine 10/03/2019 12:08 PM 0.3 mL 07/15/2019 Intramuscular   Manufacturer: [place]   Lot: [id number]   [place]: [id number]

## 2019-11-01 ENCOUNTER — Ambulatory Visit: Payer: Medicare Other | Attending: Internal Medicine

## 2019-11-01 DIAGNOSIS — Z23 Encounter for immunization: Secondary | ICD-10-CM

## 2019-11-01 NOTE — Progress Notes (Signed)
   Covid-19 Vaccination Clinic  Name:  Elijah [name]    MRN: [id number] DOB: 1941-01-29  11/01/2019  [name] was observed post Covid-19 immunization for 15 minutes without incident. He was provided with Vaccine Information Sheet and instruction to access the V-Safe system.   [name] was instructed to call 911 with any severe reactions post vaccine: Marland Kitchen Difficulty breathing  . Swelling of face and throat  . A fast heartbeat  . A bad rash all over body  . Dizziness and weakness   Immunizations Administered    Name Date Dose VIS Date Route   Pfizer COVID-19 Vaccine 11/01/2019 12:01 PM 0.3 mL 07/15/2019 Intramuscular   Manufacturer: [employer]   Lot: [id number]   [place]: [id number]

## 2019-11-21 ENCOUNTER — Other Ambulatory Visit: Payer: Self-pay | Admitting: Family Medicine

## 2019-12-28 ENCOUNTER — Other Ambulatory Visit: Payer: Self-pay

## 2019-12-28 ENCOUNTER — Other Ambulatory Visit (INDEPENDENT_AMBULATORY_CARE_PROVIDER_SITE_OTHER): Payer: Medicare Other

## 2019-12-28 DIAGNOSIS — E042 Nontoxic multinodular goiter: Secondary | ICD-10-CM

## 2019-12-28 LAB — TSH: TSH: 0.96 u[IU]/mL (ref 0.35–4.50)

## 2019-12-28 LAB — T4, FREE: Free T4: 1.02 ng/dL (ref 0.60–1.60)

## 2019-12-30 ENCOUNTER — Other Ambulatory Visit: Payer: Self-pay

## 2020-01-04 ENCOUNTER — Encounter: Payer: Self-pay | Admitting: Endocrinology

## 2020-01-04 ENCOUNTER — Other Ambulatory Visit: Payer: Self-pay

## 2020-01-04 ENCOUNTER — Ambulatory Visit (INDEPENDENT_AMBULATORY_CARE_PROVIDER_SITE_OTHER): Payer: Medicare Other | Admitting: Endocrinology

## 2020-01-04 VITALS — BP 140/72 | HR 63 | Ht 75.0 in | Wt 205.6 lb

## 2020-01-04 DIAGNOSIS — E042 Nontoxic multinodular goiter: Secondary | ICD-10-CM

## 2020-01-04 NOTE — Progress Notes (Signed)
Patient ID: Elijah [name], male   DOB: 1941-01-18, 79 y.o.   MRN: [id number]            Reason for Appointment: Follow-up of thyroid    History of Present Illness:   The patient's dominant left thyroid nodule was first discovered when he was having CT angiography for his cerebrovascular disease  The thyroid ultrasound on 05/14/16 showed the following Has a dominant nodule, location Left; Superior Size: Measures 3.7 x 2.0 x 4.3 cm, Composition: solid/almost completely solid (2) Echogenicity: isoechoic (1) Also has 6-10 nodules over 1 cm bilaterally  He was able to get a biopsy done of his dominant nodule in 11/2016 which was benign as follows Thyroid biopsy results:  LEFT UPPER POLE, FINE NEEDLE ASPIRATION (SPECIMEN 1 OF 1, COLLECTED 11/11/16): CONSISTENT WITH BENIGN FOLLICULAR NODULE (BETHESDA CATEGORY II).   RECENT history:  He was last seen in 01/2019 He has not noticed any swelling in his neck area Did not feel any local pressure in the front of the neck area or difficulty swallowing  Thyroid levels have been normal consistently  He was not sent for biopsy of the nodule to be because of stable features and being on chronic Xarelto   Lab Results  Component Value Date   FREET4 1.02 12/28/2019   FREET4 0.97 11/25/2017   FREET4 1.12 05/22/2016   TSH 0.96 12/28/2019   TSH 0.74 09/14/2019   TSH 1.23 09/01/2018   His last ultrasound was done in 11/2017 showing the following  Dominant left nodule: Maximum size: 5.1  Cm; Other 2 dimensions: 3.5 x 3.4 cm, previously, 5.3 x 3.7 x 3.5 cm  Other nodules were described as follows:  Nodule 2 is stable and meets criteria for fine needle aspiration biopsy.  Nodule 4 is stable and meets criteria for annual follow-up.  Nodule 7 is new and meets criteria for annual follow-up.  This is a 2 cm mixed cystic and solid nodule  Other nodules do not meet criteria for biopsy nor follow-up.   Allergies as of 01/04/2020      Reactions   Ciprofloxacin Nausea Only   Flagyl [metronidazole] Nausea Only      Medication List       Accurate as of January 04, 2020  1:09 PM. If you have any questions, ask your nurse or doctor.        acetaminophen 650 MG CR tablet Commonly known as: TYLENOL Take 650 mg by mouth 2 (two) times daily.   cholecalciferol 25 MCG (1000 UNIT) tablet Commonly known as: VITAMIN D3 Take 1,000 Units by mouth 2 (two) times daily.   famotidine 20 MG tablet Commonly known as: PEPCID TAKE 1 TABLET(20 MG) BY MOUTH TWICE DAILY What changed: See the new instructions.   metoprolol succinate 25 MG 24 hr tablet Commonly known as: TOPROL-XL TAKE 1 TABLET BY MOUTH DAILY WITH OR IMMEDIATELY FOLLOWING A MEAL What changed: See the new instructions.   multivitamin with minerals Tabs tablet Take 1 tablet by mouth daily.   simvastatin 20 MG tablet Commonly known as: ZOCOR TAKE 1 TABLET BY MOUTH EVERY DAY   Xarelto 20 MG Tabs tablet Generic drug: rivaroxaban TAKE 1 TABLET BY MOUTH EVERY DAY       Allergies:  Allergies  Allergen Reactions  . Ciprofloxacin Nausea Only  . Flagyl [Metronidazole] Nausea Only    Past Medical History:  Diagnosis Date  . Adenomatous colon polyp   . Arthritis 09-10-11   rt. hip, knee, hands  .  Cancer ([place]) 09-10-11   dx. Prostate cancer-bx. done 12'12, surgery planned  . Cataract   . CKD (chronic kidney disease)    stage III (09/01/18)  . Diverticulosis   . Hemorrhoid   . Hx of adenomatous colonic polyps 06/05/2011  . Hyperlipidemia   . Hypertension   . 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 etiology  . Peripheral neuropathy   . Permanent atrial fibrillation ([place]) 09/10/2011  . Prostate cancer ([place])   . Stroke [facility])     There is no history of radiation to the neck in childhood  Past Surgical History:  Procedure Laterality Date  . COLONOSCOPY  04/2003, 06/05/2011   diverticulosis, internal and external hemorrhoids 2004 and 2012, 2 small polyps 2012  . EYE SURGERY   09-10-11   Only stitches to close cut, and observation of hematoma from injury, cataracts bilateral  . INGUINAL HERNIA REPAIR Right 06/16/2019   Procedure: OPEN RIGHT INGUINAL HERNIA REPAIR;  Surgeon: Alphonsa Overall, MD;  Location: [place];  Service: General;  Laterality: Right;  OPEN RIGHT INGUINAL HERNIA REPAIR  . ROBOT ASSISTED LAPAROSCOPIC RADICAL PROSTATECTOMY  09/15/2011   Procedure: ROBOTIC ASSISTED LAPAROSCOPIC RADICAL PROSTATECTOMY LEVEL 2;  Surgeon: Dutch Gray, MD;  Location: [facility];  Service: Urology;  Laterality: N/A;        . TONSILLECTOMY      Family History  Problem Relation Age of Onset  . Diabetes Mother   . Hypertension Mother   . Diverticulosis Mother   . Heart disease Father 71  . Heart attack Father 37       MI  . Prostate cancer Brother   . Alzheimer's disease Paternal Grandmother   . Heart attack Paternal Grandfather   . Colon cancer Neg Hx     Social History:  reports that he quit smoking about 51 years ago. He has never used smokeless tobacco. He reports that he does not drink alcohol or use drugs.   Review of Systems:  Wt Readings from Last 3 Encounters:  01/04/20 205 lb 9.6 oz (93.3 kg)  09/14/19 204 lb 12.8 oz (92.9 kg)  06/16/19 205 lb (93 kg)    Examination:   BP 140/72 (BP Location: Left Arm, Patient Position: Sitting, Cuff Size: Normal)   Pulse 63   Ht 6\' 3"  (1.905 m)   Wt 205 lb 9.6 oz (93.3 kg)   SpO2 98%   BMI 25.70 kg/m           THYROID:  Right lobe of the thyroid is enlarged 3-3-1/2 times normal, firm without any distinct nodule This feels about 3 times normal, relatively smooth and firm  Left lobe is palpable on swallowing, in the medial aspect and ill-defined It is low-lying and smooth, slightly soft  No lymphadenopathy in the neck P[place]ton sign negative No stridor Reflexes normal at biceps  Assessment/Plan:  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  He also had a new nodule which was not meeting criteria for biopsy at that time   Elayne Snare 01/04/2020  Note: This office note was prepared with Dragon voice recognition system  technology. Any transcriptional errors that result from this process are unintentional.

## 2020-01-18 ENCOUNTER — Ambulatory Visit
Admission: RE | Admit: 2020-01-18 | Discharge: 2020-01-18 | Disposition: A | Discharge status: Home / Self Care | Payer: Medicare Other | Source: Ambulatory Visit | Attending: Endocrinology | Admitting: Endocrinology

## 2020-01-18 DIAGNOSIS — E042 Nontoxic multinodular goiter: Secondary | ICD-10-CM | POA: Diagnosis not present

## 2020-02-19 ENCOUNTER — Other Ambulatory Visit: Payer: Self-pay | Admitting: Family Medicine

## 2020-02-22 DIAGNOSIS — Z85820 Personal history of malignant melanoma of skin: Secondary | ICD-10-CM | POA: Diagnosis not present

## 2020-02-22 DIAGNOSIS — X32XXXD Exposure to sunlight, subsequent encounter: Secondary | ICD-10-CM | POA: Diagnosis not present

## 2020-02-22 DIAGNOSIS — D225 Melanocytic nevi of trunk: Secondary | ICD-10-CM | POA: Diagnosis not present

## 2020-02-22 DIAGNOSIS — Z1283 Encounter for screening for malignant neoplasm of skin: Secondary | ICD-10-CM | POA: Diagnosis not present

## 2020-02-22 DIAGNOSIS — C44719 Basal cell carcinoma of skin of left lower limb, including hip: Secondary | ICD-10-CM | POA: Diagnosis not present

## 2020-02-22 DIAGNOSIS — Z08 Encounter for follow-up examination after completed treatment for malignant neoplasm: Secondary | ICD-10-CM | POA: Diagnosis not present

## 2020-02-22 DIAGNOSIS — L570 Actinic keratosis: Secondary | ICD-10-CM | POA: Diagnosis not present

## 2020-02-22 DIAGNOSIS — C44619 Basal cell carcinoma of skin of left upper limb, including shoulder: Secondary | ICD-10-CM | POA: Diagnosis not present

## 2020-03-12 NOTE — Patient Instructions (Addendum)
Health Maintenance Due  Topic Date Due  . Hepatitis C Screening  Never done  . INFLUENZA VACCINE will get later in the year.  03/04/2020    A Fib: Continue all medications. If any changes let our office know.   Cholesterol: Labs looked excellent at last check. Continue medications and healthy lifestyle changes.   Neuropathy: If you decide that you want to try something as needed for the pain let our office know.   Make sure your avoiding NSAIDSs due to kidney function. Take only tylenol.   Continue to follow up with dermatology.   Amazing work on weight loss and exercise. Keep up the good work.   If you have any questions or concerns before your next scheduled appointment please give our office a call.    Please stop by lab before you go If you have mychart- we will send your results within 3 business days of [place] receiving them.  If you do not have mychart- we will call you about results within 5 business days of [place] receiving them.  *please note we are currently using [facility] which has a longer processing time than [place] typically so labs may not come back as quickly as in the past *please also note that you will see labs on mychart as soon as they post. I will later go in and write notes on them- will say "notes from Dr. [name]"

## 2020-03-12 NOTE — Progress Notes (Signed)
Phone [phone] In person visit   Subjective:   Elijah [name] is a 79 y.o. year old very pleasant male patient who presents for/with See problem oriented charting Chief Complaint  Patient presents with   Hyperlipidemia   Atrial Fibrillation   This visit occurred during the SARS-CoV-2 public health emergency.  Safety protocols were in place, including screening questions prior to the visit, additional usage of staff PPE, and extensive cleaning of exam room while observing appropriate contact time as indicated for disinfecting solutions.   Past Medical History-  Patient Active Problem List   Diagnosis Date Noted   History of CVA (cerebrovascular accident) 05/14/2016    Priority: High   History of melanoma 12/04/2015    Priority: High   Atrial fibrillation ([place]) 04/06/2007    Priority: High   Hyperglycemia 08/13/2016    Priority: Medium   Thyroid nodule     Priority: Medium   GERD (gastroesophageal reflux disease) 12/04/2015    Priority: Medium   CKD (chronic kidney disease), stage III 12/07/2014    Priority: Medium   History of prostate cancer 09/23/2012    Priority: Medium   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    Priority: Medium   Essential hypertension 04/07/2007    Priority: Medium   Hyperlipidemia 04/06/2007    Priority: Medium   Double vision 12/14/2017    Priority: Low   Gross hematuria 02/12/2017    Priority: Low   Osteoarthritis of left hip 08/09/2014    Priority: Low   Actinic keratosis 06/05/2014    Priority: Low   Hx of adenomatous colonic polyps 06/05/2011    Priority: Low    Medications- reviewed and updated Current Outpatient Medications  Medication Sig Dispense Refill   acetaminophen (TYLENOL) 650 MG CR tablet Take 650 mg by mouth 2 (two) times daily.     cholecalciferol (VITAMIN D3) 25 MCG (1000 UT) tablet Take 1,000 Units by mouth 2 (two) times daily.     famotidine (PEPCID) 20 MG tablet TAKE 1  TABLET(20 MG) BY MOUTH TWICE DAILY (Patient taking differently: Take 20 mg by mouth 2 (two) times daily. ) 180 tablet 3   metoprolol succinate (TOPROL-XL) 25 MG 24 hr tablet TAKE 1 TABLET BY MOUTH DAILY WITH OR IMMEDIATELY FOLLOWING A MEAL (Patient taking differently: Take 25 mg by mouth daily. ) 90 tablet 3   Multiple Vitamin (MULTIVITAMIN WITH MINERALS) TABS tablet Take 1 tablet by mouth daily.     simvastatin (ZOCOR) 20 MG tablet TAKE 1 TABLET BY MOUTH EVERY DAY 90 tablet 1   XARELTO 20 MG TABS tablet TAKE 1 TABLET BY MOUTH EVERY DAY 90 tablet 1   No current facility-administered medications for this visit.     Objective:  BP 132/68    Pulse 68    Temp 97.6 F (36.4 C) (Temporal)    Ht 6\' 3"  (1.905 m)    Wt 205 lb (93 kg)    SpO2 99%    BMI 25.62 kg/m  Gen: NAD, resting comfortably CV: RRR no murmurs rubs or gallops Lungs: CTAB no crackles, wheeze, rhonchi Abdomen: soft/nontender/nondistended  Ext: no edema Skin: warm, dry    Assessment and Plan    #Atrial fibrillation S: Patient compliant with metoprolol 25 mg extended release for rate control.  Also uses Xarelto 20 mg for anticoagulation. A/P: Stable. Continue current medications.   -Have to monitor GFR and if consistently below 50 may reduce Xarelto to 15 mg   #Hyperlipidemia/history of stroke with LDL goal  under 70 though likely was embolic S: Compliant with simvastatin 20 mg.  LDL most recently at goal under 70 Lab Results  Component Value Date   CHOL 131 09/14/2019   HDL 35.70 (L) 09/14/2019   LDLCALC 59 09/14/2019   LDLDIRECT 74.0 03/09/2019   TRIG 182.0 (H) 09/14/2019   CHOLHDL 4 09/14/2019  A/P: Excellent control-continue current medication     #Hypertension S: Compliant with metoprolol though primarily for rate control  Walking 2 miles daily just outside the [facility] due to covid 19 BP Readings from Last 3 Encounters:  03/15/20 132/68  01/04/20 140/72  09/14/19 124/78  A/P: Stable. Continue current  medications.     #CKD stage III S: GFR has been in the 50s.  Patient knows to avoid NSAIDs-also particularly with anticoagulation. Tylenol only.  A/P: .hopefully stable- update cmp   % thyroid nodule - had follow up with Dr. Dwyane Dee in [place]. Just had TSH. Follow up ultrasound stable  # Hyperglycemia- has had elevated A1c in the past and will update this with labs today. He has lost about 25 lbs from his peak- congratulated on maintenance . Does good breakfast and then afternoon lunch/dinner and works well.  Lab Results  Component Value Date   HGBA1C 6.1 09/14/2019   # idiopathic neuropathy- does not want to use medication. Continued burning intermittent pain in feet.   # GERD- famotidine helpful. Better choice with CKD Stage III than PPI  % History of prostate cancer-still following with Dr. Alinda Money after robotic prostatectomy-recheck PSAs. They are considering release Lab Results  Component Value Date   PSA <0.015 08/18/2018   PSA <0.015 08/13/2016   PSA 0.01 (L) 10/18/2013   #history of melanoma- follows up every 6 months with dermatology   Recommended follow up: 6 months  Future Appointments  Date Time Provider [place]  04/10/2020 11:45 AM Baldwin [place], PA-C [facility]  01/09/2021 11:00 AM [facility] None  01/16/2021  1:00 PM Elayne Snare, MD [facility] None    Lab/Order associations:   ICD-10-CM   1. Essential hypertension  I10 CBC With Differential/Platelet    COMPLETE METABOLIC PANEL WITH GFR  2. Permanent atrial fibrillation (HCC)  I48.21   3. Hyperlipidemia, unspecified hyperlipidemia type  E78.5 CBC With Differential/Platelet    COMPLETE METABOLIC PANEL WITH GFR  4. Hyperglycemia  R73.9 Hemoglobin A1c  5. Stage 3 chronic kidney disease, unspecified whether stage 3a or 3b CKD  N18.30   6. Encounter for hepatitis C virus screening test for high risk patient  Z11.59 Hepatitis C antibody   Z91.89    Return precautions  advised.  [name], MD

## 2020-03-15 ENCOUNTER — Other Ambulatory Visit: Payer: Self-pay

## 2020-03-15 ENCOUNTER — Ambulatory Visit (INDEPENDENT_AMBULATORY_CARE_PROVIDER_SITE_OTHER): Payer: Medicare Other | Admitting: Family Medicine

## 2020-03-15 ENCOUNTER — Encounter: Payer: Self-pay | Admitting: Family Medicine

## 2020-03-15 VITALS — BP 132/68 | HR 68 | Temp 97.6°F | Ht 75.0 in | Wt 205.0 lb

## 2020-03-15 DIAGNOSIS — I4821 Permanent atrial fibrillation: Secondary | ICD-10-CM | POA: Diagnosis not present

## 2020-03-15 DIAGNOSIS — N183 Chronic kidney disease, stage 3 unspecified: Secondary | ICD-10-CM

## 2020-03-15 DIAGNOSIS — Z1159 Encounter for screening for other viral diseases: Secondary | ICD-10-CM

## 2020-03-15 DIAGNOSIS — Z9189 Other specified personal risk factors, not elsewhere classified: Secondary | ICD-10-CM

## 2020-03-15 DIAGNOSIS — I10 Essential (primary) hypertension: Secondary | ICD-10-CM

## 2020-03-15 DIAGNOSIS — E785 Hyperlipidemia, unspecified: Secondary | ICD-10-CM | POA: Diagnosis not present

## 2020-03-15 DIAGNOSIS — R739 Hyperglycemia, unspecified: Secondary | ICD-10-CM | POA: Diagnosis not present

## 2020-03-15 MED ORDER — METOPROLOL SUCCINATE ER 25 MG PO TB24: ORAL_TABLET | ORAL | 3 refills | Status: DC

## 2020-03-15 MED ORDER — FAMOTIDINE 20 MG PO TABS: ORAL_TABLET | ORAL | 3 refills | Status: DC

## 2020-03-15 MED ORDER — RIVAROXABAN 20 MG PO TABS: 20.0000 mg | ORAL_TABLET | Freq: Every day | ORAL | 1 refills | Status: DC

## 2020-03-16 LAB — CBC WITH DIFFERENTIAL/PLATELET
Absolute Monocytes: 626 cells/uL (ref 200–950)
Basophils Absolute: 101 cells/uL (ref 0–200)
Basophils Relative: 1.4 %
Eosinophils Absolute: 173 cells/uL (ref 15–500)
Eosinophils Relative: 2.4 %
HCT: 47 % (ref 38.5–50.0)
Hemoglobin: 15.7 g/dL (ref 13.2–17.1)
Lymphs Abs: 1138 cells/uL (ref 850–3900)
MCH: 31.7 pg (ref 27.0–33.0)
MCHC: 33.4 g/dL (ref 32.0–36.0)
MCV: 94.8 fL (ref 80.0–100.0)
MPV: 10.2 fL (ref 7.5–12.5)
Monocytes Relative: 8.7 %
Neutro Abs: 5162 cells/uL (ref 1500–7800)
Neutrophils Relative %: 71.7 %
Platelets: 184 10*3/uL (ref 140–400)
RBC: 4.96 10*6/uL (ref 4.20–5.80)
RDW: 12.5 % (ref 11.0–15.0)
Total Lymphocyte: 15.8 %
WBC: 7.2 10*3/uL (ref 3.8–10.8)

## 2020-03-16 LAB — COMPLETE METABOLIC PANEL WITH GFR
AG Ratio: 1.7 (calc) (ref 1.0–2.5)
ALT: 21 U/L (ref 9–46)
AST: 19 U/L (ref 10–35)
Albumin: 4 g/dL (ref 3.6–5.1)
Alkaline phosphatase (APISO): 82 U/L (ref 35–144)
BUN/Creatinine Ratio: 17 (calc) (ref 6–22)
BUN: 26 mg/dL — ABNORMAL HIGH (ref 7–25)
CO2: 29 mmol/L (ref 20–32)
Calcium: 9.7 mg/dL (ref 8.6–10.3)
Chloride: 108 mmol/L (ref 98–110)
Creat: 1.51 mg/dL — ABNORMAL HIGH (ref 0.70–1.18)
GFR, Est African American: 51 mL/min/{1.73_m2} — ABNORMAL LOW (ref >=60)
GFR, Est Non African American: 44 mL/min/{1.73_m2} — ABNORMAL LOW (ref >=60)
Globulin: 2.3 g/dL (calc) (ref 1.9–3.7)
Glucose, Bld: 90 mg/dL (ref 65–99)
Potassium: 4.9 mmol/L (ref 3.5–5.3)
Sodium: 141 mmol/L (ref 135–146)
Total Bilirubin: 0.7 mg/dL (ref 0.2–1.2)
Total Protein: 6.3 g/dL (ref 6.1–8.1)

## 2020-03-16 LAB — VITAMIN D 25 HYDROXY (VIT D DEFICIENCY, FRACTURES): Vit D, 25-Hydroxy: 63 ng/mL (ref 30–100)

## 2020-03-16 LAB — PHOSPHORUS: Phosphorus: 3.1 mg/dL (ref 2.1–4.3)

## 2020-03-16 LAB — HEMOGLOBIN A1C
Hgb A1c MFr Bld: 5.9 % of total Hgb — ABNORMAL HIGH (ref <5.7)
Mean Plasma Glucose: 123 (calc)
eAG (mmol/L): 6.8 (calc)

## 2020-03-16 LAB — PARATHYROID HORMONE, INTACT (NO CA): PTH: 38 pg/mL (ref 14–64)

## 2020-03-16 LAB — HEPATITIS C ANTIBODY
Hepatitis C Ab: NONREACTIVE
SIGNAL TO CUT-OFF: 0.01 (ref <1.00)

## 2020-04-08 NOTE — Progress Notes (Signed)
Cardiology Office Note Date:  04/10/2020  Patient ID:  Elijah [name], DOB 03-11-1941, MRN [id number] PCP:  [name], MD  Cardiologist:  Dr. Rayann Heman   Chief Complaint: annual visit  History of Present Illness: Elijah [name] is a 79 y.o. male with history of permanent AFib, HTN, HLD, CRI stage III, peripheral neuropathy, prostate cancer s/p surgery with subsequent problems with fecal incontinence at times, 2017 hospitalized at [facility] with transient facial droop, slurred speech found with multiple R sided CVA's, seen by neurology and started on Xarelto for a/c given his AF discharge 05/05/16. 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  He has historically been seen in the AF clinic via his PMD in May of 2016, though despite conversation of concerns about possible TIA like symptoms, the patient was not convinced about starting a/c.  He was seen by myself (case reviewed with Dr. Lovena Le at the time of visit) in Oct 2017,  as a new patient to [facility] to re-establish EP care secondary to his AF.  He reported seeing Dr. Rayann Heman +/- 7-8 years ago, at that time, for possible AF ablation, but recalls felt that he was a poor candidate given he had already been in AF for years and was without symptoms.  He denied any kind of palpitations or exertional intolerances, was unaware of his AF, no CP, or SOB.  He reported his neurological symptoms completely resolved within an hour or so and he remained feeling well.  He denied any dizziness, near syncope or syncope, he has neuropathy chronically and occassional gait instability with infrequent falls, no injuries.  He was planned for PMD for f/u then.  I saw him in Aug 2018, he was doing very welll.  [place] walking with his hips/knees being his only exertional limitations.  No CP, palpitations or SOB, no dizziness, near syncope or syncope.  3 months prior he had some small amounts of blood-tinged  urine saw urology with labs and work up that he said noted some "sore" on his bladder that had healed, and felt to be the culprit and had not been recurrent.  I suggested we do labs given his xarelto but prefered not to having had labs done by he says by urology and at least knew for sure his kidney function was good, and has full labs planned with PMD in a month or so.  He also was following with neurology and endocrinology.  He was seen by Dr. Rayann Heman Aug 2019, doing well, declined sleep study with c/o fatigue, (and snoring).  Also offered to try decreased dose of his BB 2/2 c/o fatigue but did not want to make changes.  Planned for annual APP visits.  03/09/19: H/H 15/46 03/16/19: Creat 1.36  (cal crcl 60 using today's weight)  I saw him 03/29/2019 He is doing well.  Had a colonoscopy last week with some heorrhoidal bleeding afterwards that has resolved, otherwise doing well with his xarelto.  He is walking with his wife in the park, reports good exertional capacity, no CP or SOB.  He denies any palpitations or cardiac awareness. No dizzy spells, near syncope or syncope. He contiues with a general vague sense of fatigue, less energy though thinks this was a result of his stroke (timing-wise).  He does not feel like he sleeps poorly, does snore on occasion, but doesn't feel like he has apnea, or need of sleep study. He did decrease his  Toprol in half after Dr. Jackalyn Lombard visit and feels like his HR has settled generally in the 60's-70's. He reports labs are monitored with his PMD, usually Q 6 months  No changes were made, planned for annual visits, more frequent of needed.  TODAY He continues to do well. Denies any cardiac awareness, no CP or palpitations [place] 2 miles and feels like he has good exertional capacity. No SOB or DOE No difficulties with his ADLs Sees his PMD Q 97mo  No bleeding or signs of bleeding   He has no concerns today  Past Medical History:    Diagnosis Date  . Adenomatous colon polyp   . Arthritis 09-10-11   rt. hip, knee, hands  . Cancer ([place]) 09-10-11   dx. Prostate cancer-bx. done 12'12, surgery planned  . Cataract   . CKD (chronic kidney disease)    stage III (09/01/18)  . Diverticulosis   . Hemorrhoid   . Hx of adenomatous colonic polyps 06/05/2011  . Hyperlipidemia   . Hypertension   . 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 etiology  . Peripheral neuropathy   . Permanent atrial fibrillation ([place]) 09/10/2011  . Prostate cancer ([place])   . Stroke [facility])     Past Surgical History:  Procedure Laterality Date  . COLONOSCOPY  04/2003, 06/05/2011   diverticulosis, internal and external hemorrhoids 2004 and 2012, 2 small polyps 2012  . EYE SURGERY  09-10-11   Only stitches to close cut, and observation of hematoma from injury, cataracts bilateral  . INGUINAL HERNIA REPAIR Right 06/16/2019   Procedure: OPEN RIGHT INGUINAL HERNIA REPAIR;  Surgeon: Alphonsa Overall, MD;  Location: [place];  Service: General;  Laterality: Right;  OPEN RIGHT INGUINAL HERNIA REPAIR  . ROBOT ASSISTED LAPAROSCOPIC RADICAL PROSTATECTOMY  09/15/2011   Procedure: ROBOTIC ASSISTED LAPAROSCOPIC RADICAL PROSTATECTOMY LEVEL 2;  Surgeon: Dutch Gray, MD;  Location: [facility];  Service: Urology;  Laterality: N/A;        . TONSILLECTOMY      Current Outpatient Medications  Medication Sig Dispense Refill  . acetaminophen (TYLENOL) 650 MG CR tablet Take 650 mg by mouth 2 (two) times daily.    . cholecalciferol (VITAMIN D3) 25 MCG (1000 UT) tablet Take 1,000 Units by mouth 2 (two) times daily.    . famotidine (PEPCID) 20 MG tablet TAKE 1 TABLET(20 MG) BY MOUTH TWICE DAILY 180 tablet 3  . metoprolol succinate (TOPROL-XL) 25 MG 24 hr tablet TAKE 1 TABLET BY MOUTH DAILY WITH OR IMMEDIATELY FOLLOWING A MEAL 90 tablet 3  . Multiple Vitamin (MULTIVITAMIN WITH MINERALS) TABS tablet Take 1 tablet by mouth daily.    . rivaroxaban (XARELTO) 20 MG TABS tablet Take 1 tablet (20  mg total) by mouth daily. 90 tablet 1  . simvastatin (ZOCOR) 20 MG tablet TAKE 1 TABLET BY MOUTH EVERY DAY 90 tablet 1   No current facility-administered medications for this visit.    Allergies:   Ciprofloxacin and Flagyl [metronidazole]   Social History:  The patient  reports that he quit smoking about 51 years ago. He has never used smokeless tobacco. He reports that he does not drink alcohol and does not use drugs.   Family History:  The patient's family history includes Alzheimer's disease in his paternal grandmother; Diabetes in his mother; Diverticulosis in his mother; Heart attack in his paternal grandfather; Heart attack (age of onset: 56) in his father; Heart disease (age of onset: 58) in his  father; Hypertension in his mother; Prostate cancer in his brother.  ROS:  Please see the history of present illness.  All other systems are reviewed and otherwise negative.   PHYSICAL EXAM:  VS:  BP 120/62   Pulse 61   Ht 6\' 3"  (1.905 m)   Wt 203 lb (92.1 kg)   SpO2 99%   BMI 25.37 kg/m  BMI: Body mass index is 25.37 kg/m. Well nourished, well developed, in no acute distress  HEENT: normocephalic, atraumatic  Neck: no JVD, carotid bruits or masses Cardiac: irreg-irreg, no significant murmurs, no rubs, or gallops Lungs:  CTA b/l, no wheezing, rhonchi or rales  Abd: soft, nontender MS: no deformity or atrophy Ext:  no edema  Skin: warm and dry, no rash Neuro:  No gross deficits appreciated Psych: euthymic mood, full affect   EKG:  Done today and reviewed by myself  Afib 61bpm, RBBB, some baseline artifact   05/15/16: TTE Study Conclusions - Left ventricle: The cavity size was normal. Wall thickness was   increased in a pattern of mild LVH. Systolic function was normal.   The estimated ejection fraction was in the range of 55% to 60%.   Wall motion was normal; there were no regional wall motion   abnormalities. - Mitral valve: Calcified annulus. There was mild  regurgitation. - Left atrium: The atrium was severely dilated. - Right ventricle: The cavity size was mildly dilated. - Right atrium: The atrium was severely dilated. Impressions: - Normal LV systolic function; mild LVH; severe biatrial  (LA 34mm)   enlargement; mild RVE; mild MR; trace TR.  Recent Labs: 12/28/2019: TSH 0.96 03/15/2020: ALT 21; BUN 26; Creat 1.51; Hemoglobin 15.7; Platelets 184; Potassium 4.9; Sodium 141  09/14/2019: Cholesterol 131; HDL 35.70; LDL Cholesterol 59; Total CHOL/HDL Ratio 4; Triglycerides 182.0; VLDL 36.4   CrCl cannot be calculated (Patient's most recent lab result is older than the maximum 21 days allowed.).   Wt Readings from Last 3 Encounters:  04/10/20 203 lb (92.1 kg)  03/15/20 205 lb (93 kg)  01/04/20 205 lb 9.6 oz (93.3 kg)     Other studies reviewed: Additional studies/records reviewed today include: summarized above  ASSESSMENT AND PLAN:  1. Long hx of permanent AFib     rate controlled and asymptomatic     CHA2DS2Vasc is 4,  on Xarelto, appropriately dosed        2. HTN     Looks good, no changes    Disposition: we will continue to see him annually, sooner if needed    Current medicines are reviewed at length with the patient today.  The patient did not have any concerns regarding medicines.  Haywood Lasso, PA-C 04/10/2020 12:07 PM     [address] [place] [address] [phone] (office)  [phone] (fax)

## 2020-04-10 ENCOUNTER — Other Ambulatory Visit: Payer: Self-pay

## 2020-04-10 ENCOUNTER — Ambulatory Visit (INDEPENDENT_AMBULATORY_CARE_PROVIDER_SITE_OTHER): Payer: Medicare Other | Admitting: Physician Assistant

## 2020-04-10 VITALS — BP 120/62 | HR 61 | Ht 75.0 in | Wt 203.0 lb

## 2020-04-10 DIAGNOSIS — I10 Essential (primary) hypertension: Secondary | ICD-10-CM | POA: Diagnosis not present

## 2020-04-10 DIAGNOSIS — I4821 Permanent atrial fibrillation: Secondary | ICD-10-CM | POA: Diagnosis not present

## 2020-04-10 NOTE — Patient Instructions (Signed)
Medication Instructions:   Your physician recommends that you continue on your current medications as directed. Please refer to the Current Medication list given to you today.  *If you need a refill on your cardiac medications before your next appointment, please call your pharmacy*   Lab Work: NONE ORDERED  TODAY   If you have labs (blood work) drawn today and your tests are completely normal, you will receive your results only by: . MyChart Message (if you have MyChart) OR . A paper copy in the mail If you have any lab test that is abnormal or we need to change your treatment, we will call you to review the results.   Testing/Procedures: NONE ORDERED  TODAY   Follow-Up: At [facility], you and your health needs are our priority.  As part of our continuing mission to provide you with exceptional heart care, we have created designated Provider Care Teams.  These Care Teams include your primary Cardiologist (physician) and Advanced Practice Providers (APPs -  Physician Assistants and Nurse Practitioners) who all work together to provide you with the care you need, when you need it.  We recommend signing up for the patient portal called "MyChart".  Sign up information is provided on this After Visit Summary.  MyChart is used to connect with patients for Virtual Visits (Telemedicine).  Patients are able to view lab/test results, encounter notes, upcoming appointments, etc.  Non-urgent messages can be sent to your provider as well.   To learn more about what you can do with MyChart, go to [email or web address].    Your next appointment:   1 year(s)  The format for your next appointment:   In Person  Provider:   You may see James Allred, MD or one of the following Advanced Practice Providers on your designated Care Team:    Amber Seiler, NP  Renee Ursuy, PA-C  Michael "Andy" Tillery, PA-C    Other Instructions   

## 2020-04-16 ENCOUNTER — Telehealth: Payer: Self-pay | Admitting: Physician Assistant

## 2020-04-16 NOTE — Telephone Encounter (Signed)
Returned call to pt.  Left a message that I didn't see any type of calls or messages sent to pt, unless his "AVS" populated to his MyChart once his visit was completed. Left a message that if he has further questions, to call us back.

## 2020-04-16 NOTE — Telephone Encounter (Signed)
Patient sent message in regards to his appointment scheduled 04/10/20 with Renee. He states he "was there on 9/7 at 11:45, was checked in, and met with Jens Som as scheduled? Why the message?" Please advise.

## 2020-07-03 ENCOUNTER — Encounter: Payer: Self-pay | Admitting: Family Medicine

## 2020-07-04 ENCOUNTER — Encounter: Payer: Self-pay | Admitting: Physician Assistant

## 2020-07-04 ENCOUNTER — Ambulatory Visit (INDEPENDENT_AMBULATORY_CARE_PROVIDER_SITE_OTHER): Payer: Medicare Other | Admitting: Physician Assistant

## 2020-07-04 ENCOUNTER — Other Ambulatory Visit: Payer: Self-pay

## 2020-07-04 VITALS — BP 161/79 | HR 74 | Temp 98.7°F | Ht 75.0 in | Wt 208.8 lb

## 2020-07-04 DIAGNOSIS — R21 Rash and other nonspecific skin eruption: Secondary | ICD-10-CM

## 2020-07-04 MED ORDER — PREDNISONE 10 MG PO TABS
ORAL_TABLET | ORAL | 0 refills | Status: DC
Start: 2020-07-04 — End: 2020-09-20

## 2020-07-04 NOTE — Patient Instructions (Signed)
It was great to see you!  Start oral prednisone as prescribed.  Start over the counter antihistamines such as Zyrtec (cetirizine), Claritin (loratadine), Allegra (fexofenadine), or Xyzal (levocetirizine)   May also take oral benadryl at night for breakthrough itching  Continue pepcid as you are taking  Follow-up with me if you have any concerns or worsening symptoms  Take care,  Inda Coke PA-C

## 2020-07-04 NOTE — Progress Notes (Signed)
Elijah [name] is a 79 y.o. male here for a new problem.  History of Present Illness:   Chief Complaint  Patient presents with  . Rash    all over, Started Monday Night   . Flu Vaccine    PT wants to discuss if he can get it with what he has going on     HPI   Rash Started two nights ago.  It is all over his body -- upper and lower extremities, trunk and back. Denies: new medications, environmental exposures, recent URI, contacts with similar rash, fever, chills, blisters, pain, lip/airway involvement, unusual foods  He has not tried anything for his symptoms.     Past Medical History:  Diagnosis Date  . Adenomatous colon polyp   . Arthritis 09-10-11   rt. hip, knee, hands  . Cancer ([place]) 09-10-11   dx. Prostate cancer-bx. done 12'12, surgery planned  . Cataract   . CKD (chronic kidney disease)    stage III (09/01/18)  . Diverticulosis   . Hemorrhoid   . Hx of adenomatous colonic polyps 06/05/2011  . Hyperlipidemia   . Hypertension   . 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 etiology  . Peripheral neuropathy   . Permanent atrial fibrillation ([place]) 09/10/2011  . Prostate cancer ([place])   . Stroke [facility])      Social History   Tobacco Use  . Smoking status: Former Smoker    Quit date: 08/04/1968    Years since quitting: 51.9  . Smokeless tobacco: Never Used  Vaping Use  . Vaping Use: Never used  Substance Use Topics  . Alcohol use: No    Comment: none in 20 yrs  . Drug use: No    Past Surgical History:  Procedure Laterality Date  . COLONOSCOPY  04/2003, 06/05/2011   diverticulosis, internal and external hemorrhoids 2004 and 2012, 2 small polyps 2012  . EYE SURGERY  09-10-11   Only stitches to close cut, and observation of hematoma from injury, cataracts bilateral  . INGUINAL HERNIA REPAIR Right 06/16/2019   Procedure: OPEN RIGHT INGUINAL HERNIA REPAIR;  Surgeon: Alphonsa Overall, MD;  Location: [place];  Service: General;  Laterality: Right;  OPEN RIGHT INGUINAL HERNIA  REPAIR  . ROBOT ASSISTED LAPAROSCOPIC RADICAL PROSTATECTOMY  09/15/2011   Procedure: ROBOTIC ASSISTED LAPAROSCOPIC RADICAL PROSTATECTOMY LEVEL 2;  Surgeon: Dutch Gray, MD;  [facility];  Service: Urology;  Laterality: N/A;        . TONSILLECTOMY      Family History  Problem Relation Age of Onset  . Diabetes Mother   . Hypertension Mother   . Diverticulosis Mother   . Heart disease Father 65  . Heart attack Father 59       MI  . Prostate cancer Brother   . Alzheimer's disease Paternal Grandmother   . Heart attack Paternal Grandfather   . Colon cancer Neg Hx     Allergies  Allergen Reactions  . Ciprofloxacin Nausea Only  . Flagyl [Metronidazole] Nausea Only    Current Medications:   Current Outpatient Medications:  .  acetaminophen (TYLENOL) 650 MG CR tablet, Take 650 mg by mouth 2 (two) times daily., Disp: , Rfl:  .  cholecalciferol (VITAMIN D3) 25 MCG (1000 UT) tablet, Take 1,000 Units by mouth 2 (two) times daily., Disp: , Rfl:  .  famotidine (PEPCID) 20 MG tablet, TAKE 1 TABLET(20 MG) BY MOUTH TWICE DAILY, Disp: 180 tablet, Rfl: 3 .  metoprolol succinate (TOPROL-XL) 25 MG 24  hr tablet, TAKE 1 TABLET BY MOUTH DAILY WITH OR IMMEDIATELY FOLLOWING A MEAL, Disp: 90 tablet, Rfl: 3 .  Multiple Vitamin (MULTIVITAMIN WITH MINERALS) TABS tablet, Take 1 tablet by mouth daily., Disp: , Rfl:  .  rivaroxaban (XARELTO) 20 MG TABS tablet, Take 1 tablet (20 mg total) by mouth daily., Disp: 90 tablet, Rfl: 1 .  simvastatin (ZOCOR) 20 MG tablet, TAKE 1 TABLET BY MOUTH EVERY DAY, Disp: 90 tablet, Rfl: 1 .  predniSONE (DELTASONE) 10 MG tablet, Take two tablets daily for 1 week, then one daily for 1 week., Disp: 21 tablet, Rfl: 0   Review of Systems:   ROS Negative unless otherwise specified per HPI.  Vitals:   Vitals:   07/04/20 1456  BP: (!) 161/79  Pulse: 74  Temp: 98.7 F (37.1 C)  TempSrc: Temporal  SpO2: 98%  Weight: 208 lb 12.8 oz (94.7 kg)  Height: 6\' 3"  (1.905 m)      Body mass index is 26.1 kg/m.  Physical Exam:   Physical Exam Vitals and nursing note reviewed.  Constitutional:      General: He is not in acute distress.    Appearance: He is well-developed. He is not ill-appearing or toxic-appearing.  Cardiovascular:     Rate and Rhythm: Normal rate and regular rhythm.     Pulses: Normal pulses.     Heart sounds: Normal heart sounds, S1 normal and S2 normal.     Comments: No LE edema Pulmonary:     Effort: Pulmonary effort is normal.     Breath sounds: Normal breath sounds.  Skin:    General: Skin is warm and dry.     Comments: Diffuse erythematous urticarial rash on all extremities/trunk  Neurological:     Mental Status: He is alert.     GCS: GCS eye subscore is 4. GCS verbal subscore is 5. GCS motor subscore is 6.  Psychiatric:        Speech: Speech normal.        Behavior: Behavior normal. Behavior is cooperative.      Assessment and Plan:   Carlyn was seen today for rash and flu vaccine.  Diagnoses and all orders for this visit:  Rash and nonspecific skin eruption Suspect contact dermatitis. No red flags on exam. Start oral prednisone -- 20 mg daily x 1 week, 10 mg daily x 1 week. Continue pepcid. Start antihistamine of choice. Worsening precautions advised, follow-up if lack of improvement or new/worsening symptoms.  Other orders -     predniSONE (DELTASONE) 10 MG tablet; Take two tablets daily for 1 week, then one daily for 1 week.  Inda Coke, PA-C

## 2020-07-13 DIAGNOSIS — H5201 Hypermetropia, right eye: Secondary | ICD-10-CM | POA: Diagnosis not present

## 2020-07-13 DIAGNOSIS — H26493 Other secondary cataract, bilateral: Secondary | ICD-10-CM | POA: Diagnosis not present

## 2020-07-13 DIAGNOSIS — H52223 Regular astigmatism, bilateral: Secondary | ICD-10-CM | POA: Diagnosis not present

## 2020-07-13 DIAGNOSIS — H43393 Other vitreous opacities, bilateral: Secondary | ICD-10-CM | POA: Diagnosis not present

## 2020-07-13 DIAGNOSIS — Z961 Presence of intraocular lens: Secondary | ICD-10-CM | POA: Diagnosis not present

## 2020-08-07 ENCOUNTER — Other Ambulatory Visit: Payer: Self-pay | Admitting: Family Medicine

## 2020-08-16 DIAGNOSIS — Z961 Presence of intraocular lens: Secondary | ICD-10-CM | POA: Diagnosis not present

## 2020-08-16 DIAGNOSIS — H26492 Other secondary cataract, left eye: Secondary | ICD-10-CM | POA: Diagnosis not present

## 2020-08-16 DIAGNOSIS — H02834 Dermatochalasis of left upper eyelid: Secondary | ICD-10-CM | POA: Diagnosis not present

## 2020-08-16 DIAGNOSIS — H26493 Other secondary cataract, bilateral: Secondary | ICD-10-CM | POA: Diagnosis not present

## 2020-08-16 DIAGNOSIS — H18413 Arcus senilis, bilateral: Secondary | ICD-10-CM | POA: Diagnosis not present

## 2020-08-24 DIAGNOSIS — H5201 Hypermetropia, right eye: Secondary | ICD-10-CM | POA: Diagnosis not present

## 2020-08-24 DIAGNOSIS — H5989 Other postprocedural complications and disorders of eye and adnexa, not elsewhere classified: Secondary | ICD-10-CM | POA: Diagnosis not present

## 2020-08-24 DIAGNOSIS — H52223 Regular astigmatism, bilateral: Secondary | ICD-10-CM | POA: Diagnosis not present

## 2020-08-24 DIAGNOSIS — Z9842 Cataract extraction status, left eye: Secondary | ICD-10-CM | POA: Diagnosis not present

## 2020-08-24 DIAGNOSIS — Z961 Presence of intraocular lens: Secondary | ICD-10-CM | POA: Diagnosis not present

## 2020-08-29 DIAGNOSIS — Z85820 Personal history of malignant melanoma of skin: Secondary | ICD-10-CM | POA: Diagnosis not present

## 2020-08-29 DIAGNOSIS — D225 Melanocytic nevi of trunk: Secondary | ICD-10-CM | POA: Diagnosis not present

## 2020-08-29 DIAGNOSIS — Z08 Encounter for follow-up examination after completed treatment for malignant neoplasm: Secondary | ICD-10-CM | POA: Diagnosis not present

## 2020-08-29 DIAGNOSIS — D1801 Hemangioma of skin and subcutaneous tissue: Secondary | ICD-10-CM | POA: Diagnosis not present

## 2020-08-29 DIAGNOSIS — L821 Other seborrheic keratosis: Secondary | ICD-10-CM | POA: Diagnosis not present

## 2020-08-29 DIAGNOSIS — C44612 Basal cell carcinoma of skin of right upper limb, including shoulder: Secondary | ICD-10-CM | POA: Diagnosis not present

## 2020-08-30 DIAGNOSIS — H26491 Other secondary cataract, right eye: Secondary | ICD-10-CM | POA: Diagnosis not present

## 2020-09-06 ENCOUNTER — Other Ambulatory Visit: Payer: Self-pay | Admitting: Family Medicine

## 2020-09-12 DIAGNOSIS — Z8546 Personal history of malignant neoplasm of prostate: Secondary | ICD-10-CM | POA: Diagnosis not present

## 2020-09-19 DIAGNOSIS — N393 Stress incontinence (female) (male): Secondary | ICD-10-CM | POA: Diagnosis not present

## 2020-09-19 DIAGNOSIS — Z8546 Personal history of malignant neoplasm of prostate: Secondary | ICD-10-CM | POA: Diagnosis not present

## 2020-09-19 NOTE — Patient Instructions (Addendum)
Please stop by lab before you go If you have mychart- we will send your results within 3 business days of [place] receiving them.  If you do not have mychart- we will call you about results within 5 business days of [place] receiving them.  *please also note that you will see labs on mychart as soon as they post. I will later go in and write notes on them- will say "notes from Dr. [name]"   You are eligible to schedule your annual wellness visit with our nurse specialist Otila Kluver.  Please consider scheduling this before you leave today   Recommended follow up: Return in about 6 months (around 03/20/2021) for follow up- or sooner if needed.

## 2020-09-19 NOTE — Progress Notes (Signed)
Phone [phone] In person visit   Subjective:   Elijah [name] is a 80 y.o. year old very pleasant male patient who presents for/with See problem oriented charting Chief Complaint  Patient presents with  . Hyperlipidemia  . Hypertension  . Atrial Fibrillation   This visit occurred during the SARS-CoV-2 public health emergency.  Safety protocols were in place, including screening questions prior to the visit, additional usage of staff PPE, and extensive cleaning of exam room while observing appropriate contact time as indicated for disinfecting solutions.   Past Medical History-  Patient Active Problem List   Diagnosis Date Noted  . History of CVA (cerebrovascular accident) 05/14/2016    Priority: High  . History of melanoma 12/04/2015    Priority: High  . Atrial fibrillation ([place]) 04/06/2007    Priority: High  . Hyperglycemia 08/13/2016    Priority: Medium  . Thyroid nodule     Priority: Medium  . GERD (gastroesophageal reflux disease) 12/04/2015    Priority: Medium  . CKD (chronic kidney disease), stage III ([place]) 12/07/2014    Priority: Medium  . History of prostate cancer 09/23/2012    Priority: Medium  . 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 Essential hypertension 04/07/2007    Priority: Medium  . Hyperlipidemia 04/06/2007    Priority: Medium  . Double vision 12/14/2017    Priority: Low  . Gross hematuria 02/12/2017    Priority: Low  . Osteoarthritis of left hip 08/09/2014    Priority: Low  . Actinic keratosis 06/05/2014    Priority: Low  . Hx of adenomatous colonic polyps 06/05/2011    Priority: Low    Medications- reviewed and updated Current Outpatient Medications  Medication Sig Dispense Refill  . acetaminophen (TYLENOL) 650 MG CR tablet Take 650 mg by mouth 2 (two) times daily.    . cholecalciferol (VITAMIN D3) 25 MCG (1000 UT) tablet Take 1,000 Units by mouth 2 (two) times daily.    . famotidine (PEPCID) 20  MG tablet TAKE 1 TABLET(20 MG) BY MOUTH TWICE DAILY 180 tablet 3  . metoprolol succinate (TOPROL-XL) 25 MG 24 hr tablet TAKE 1 TABLET BY MOUTH DAILY WITH OR IMMEDIATELY FOLLOWING A MEAL 90 tablet 3  . Multiple Vitamin (MULTIVITAMIN WITH MINERALS) TABS tablet Take 1 tablet by mouth daily.    . simvastatin (ZOCOR) 20 MG tablet TAKE 1 TABLET BY MOUTH EVERY DAY 90 tablet 1  . XARELTO 20 MG TABS tablet TAKE 1 TABLET(20 MG) BY MOUTH DAILY 90 tablet 1   No current facility-administered medications for this visit.     Objective:  BP 128/70   Pulse 65   Temp 98 F (36.7 C) (Temporal)   Ht 6\' 3"  (1.905 m)   Wt 207 lb 9.6 oz (94.2 kg)   SpO2 99%   BMI 25.95 kg/m  Gen: NAD, resting comfortably CV: RRR no murmurs rubs or gallops Lungs: CTAB no crackles, wheeze, rhonchi Abdomen: soft/nontender/nondistended/normal bowel sounds. No rebound or guarding.  Ext: no edema on right, stable trace to 1+ on left ankle (his baselinewith no calf swelling or pain plus on xarelto) Skin: warm, dry    Assessment and Plan    #Atrial fibrillation S: Patient compliant with metoprolol 25 mg extended release for rate control.  Also uses Xarelto 20 mg for anticoagulation. A/P: rate controlled and anticoagulated appropriately- continue current medicines  -Have to monitor GFR and if consistently below 50 may reduce Xarelto to 15 mg   #Hyperlipidemia/history  of stroke with LDL goal under 70 though likely was embolic S: Compliant with simvastatin 20 mg.  LDL most recently at goal under 70. Xarelto for stroke prevention Lab Results  Component Value Date   CHOL 131 09/14/2019   HDL 35.70 (L) 09/14/2019   LDLCALC 59 09/14/2019   LDLDIRECT 74.0 03/09/2019   TRIG 182.0 (H) 09/14/2019   CHOLHDL 4 09/14/2019  A/P: hyperlipidemia- LDL has been at goal under 70 on simvastatin- update today  No recurrent stroke on xarelto      #Hypertension S: Compliant with metoprolol though primarily for rate control A/P: Stable.  Continue current medications.      #CKD stage III S: GFR has been in the 50s.  Patient knows to avoid NSAIDs-also particularly with anticoagulation.  Would prefer H2 blockers over PPI A/P: hopefully stable- update cmp today.  tylelnol only if needed for pain   # Hyperglycemia/insulin resistance/prediabetes S:  Medication: none. Was on prednisone in December which could raise blood sugar short term Exercise and diet- going to [facility] 5-6 days a week. Eating reasonably healthy Lab Results  Component Value Date   HGBA1C 5.9 (H) 03/15/2020   HGBA1C 6.1 09/14/2019   HGBA1C 6.0 03/09/2019  A/P: hopefully stable- update a1c today. Continue without meds  # idiopathic neuropathy- stable.  does not want to use medication   %Thyroid nodule- follows with Dr. Dwyane Dee. We annually Update TSH with labs due to prior thyroid nodule though had benign biopsy in 2018. Updated ultrasound 01/2020 stable  Lab Results  Component Value Date   TSH 0.96 12/28/2019   % History of prostate cancer-still following with Dr. Alinda Money after robotic prostatectomy-recheck PSAs - good report yesterday  %history of melanoma- follows up every 6 months with dermatology   Recommended follow up: Return in about 6 months (around 03/20/2021) for follow up- or sooner if needed. Future Appointments  Date Time Provider [place]  01/09/2021 11:00 AM [facility] None  01/16/2021  1:00 PM Elayne Snare, MD [facility] None   Lab/Order associations:   ICD-10-CM   1. Essential hypertension  I10 CBC with Differential/Platelet    Comprehensive metabolic panel    Lipid panel  2. Permanent atrial fibrillation (HCC)  I48.21   3. Stage 3 chronic kidney disease, unspecified whether stage 3a or 3b CKD (HCC)  N18.30   4. Hyperlipidemia, unspecified hyperlipidemia type  E78.5 CBC with Differential/Platelet    Comprehensive metabolic panel    Lipid panel  5. Hyperglycemia  R73.9 Hemoglobin A1c  6. Thyroid nodule  E04.1 TSH    Return precautions advised.  [name], MD

## 2020-09-20 ENCOUNTER — Ambulatory Visit (INDEPENDENT_AMBULATORY_CARE_PROVIDER_SITE_OTHER): Payer: Medicare Other | Admitting: Family Medicine

## 2020-09-20 ENCOUNTER — Encounter: Payer: Self-pay | Admitting: Family Medicine

## 2020-09-20 ENCOUNTER — Other Ambulatory Visit: Payer: Self-pay

## 2020-09-20 VITALS — BP 128/70 | HR 65 | Temp 98.0°F | Ht 75.0 in | Wt 207.6 lb

## 2020-09-20 DIAGNOSIS — I4821 Permanent atrial fibrillation: Secondary | ICD-10-CM

## 2020-09-20 DIAGNOSIS — R739 Hyperglycemia, unspecified: Secondary | ICD-10-CM | POA: Diagnosis not present

## 2020-09-20 DIAGNOSIS — N183 Chronic kidney disease, stage 3 unspecified: Secondary | ICD-10-CM

## 2020-09-20 DIAGNOSIS — I10 Essential (primary) hypertension: Secondary | ICD-10-CM

## 2020-09-20 DIAGNOSIS — E785 Hyperlipidemia, unspecified: Secondary | ICD-10-CM | POA: Diagnosis not present

## 2020-09-20 DIAGNOSIS — K219 Gastro-esophageal reflux disease without esophagitis: Secondary | ICD-10-CM

## 2020-09-20 DIAGNOSIS — E041 Nontoxic single thyroid nodule: Secondary | ICD-10-CM | POA: Diagnosis not present

## 2020-09-20 LAB — COMPREHENSIVE METABOLIC PANEL
ALT: 22 U/L (ref 0–53)
AST: 20 U/L (ref 0–37)
Albumin: 3.8 g/dL (ref 3.5–5.2)
Alkaline Phosphatase: 71 U/L (ref 39–117)
BUN: 24 mg/dL — ABNORMAL HIGH (ref 6–23)
CO2: 29 mEq/L (ref 19–32)
Calcium: 9.5 mg/dL (ref 8.4–10.5)
Chloride: 108 mEq/L (ref 96–112)
Creatinine, Ser: 1.5 mg/dL (ref 0.40–1.50)
GFR: 44.1 mL/min — ABNORMAL LOW (ref >60.00)
Glucose, Bld: 94 mg/dL (ref 70–99)
Potassium: 4.8 mEq/L (ref 3.5–5.1)
Sodium: 140 mEq/L (ref 135–145)
Total Bilirubin: 0.6 mg/dL (ref 0.2–1.2)
Total Protein: 6.3 g/dL (ref 6.0–8.3)

## 2020-09-20 LAB — CBC WITH DIFFERENTIAL/PLATELET
Basophils Absolute: 0.1 10*3/uL (ref 0.0–0.1)
Basophils Relative: 1.9 % (ref 0.0–3.0)
Eosinophils Absolute: 0.2 10*3/uL (ref 0.0–0.7)
Eosinophils Relative: 2.7 % (ref 0.0–5.0)
HCT: 44.1 % (ref 39.0–52.0)
Hemoglobin: 15 g/dL (ref 13.0–17.0)
Lymphocytes Relative: 16.6 % (ref 12.0–46.0)
Lymphs Abs: 1 10*3/uL (ref 0.7–4.0)
MCHC: 34 g/dL (ref 30.0–36.0)
MCV: 94.4 fl (ref 78.0–100.0)
Monocytes Absolute: 0.6 10*3/uL (ref 0.1–1.0)
Monocytes Relative: 10 % (ref 3.0–12.0)
Neutro Abs: 4.3 10*3/uL (ref 1.4–7.7)
Neutrophils Relative %: 68.8 % (ref 43.0–77.0)
Platelets: 173 10*3/uL (ref 150.0–400.0)
RBC: 4.67 Mil/uL (ref 4.22–5.81)
RDW: 13.8 % (ref 11.5–15.5)
WBC: 6.2 10*3/uL (ref 4.0–10.5)

## 2020-09-20 LAB — LIPID PANEL
Cholesterol: 113 mg/dL (ref 0–200)
HDL: 37.1 mg/dL — ABNORMAL LOW (ref >39.00)
LDL Cholesterol: 54 mg/dL (ref 0–99)
NonHDL: 76.17
Total CHOL/HDL Ratio: 3
Triglycerides: 109 mg/dL (ref 0.0–149.0)
VLDL: 21.8 mg/dL (ref 0.0–40.0)

## 2020-09-20 LAB — TSH: TSH: 0.59 u[IU]/mL (ref 0.35–4.50)

## 2020-09-20 LAB — HEMOGLOBIN A1C: Hgb A1c MFr Bld: 5.9 % (ref 4.6–6.5)

## 2020-10-09 ENCOUNTER — Telehealth: Payer: Self-pay | Admitting: Family Medicine

## 2020-10-09 DIAGNOSIS — Z08 Encounter for follow-up examination after completed treatment for malignant neoplasm: Secondary | ICD-10-CM | POA: Diagnosis not present

## 2020-10-09 DIAGNOSIS — Z85828 Personal history of other malignant neoplasm of skin: Secondary | ICD-10-CM | POA: Diagnosis not present

## 2020-10-09 NOTE — Telephone Encounter (Signed)
Left message for patient to call back and schedule Medicare Annual Wellness Visit (AWV) either virtually OR in office.   Last AWV 09/14/19; please schedule at anytime with [facility] at [facility].  This should be a 45 minute visit.

## 2021-01-09 ENCOUNTER — Other Ambulatory Visit: Payer: Medicare Other

## 2021-01-16 ENCOUNTER — Ambulatory Visit: Payer: Medicare Other | Admitting: Endocrinology

## 2021-01-22 ENCOUNTER — Ambulatory Visit: Payer: Medicare Other | Admitting: Endocrinology

## 2021-01-23 ENCOUNTER — Other Ambulatory Visit: Payer: Self-pay | Admitting: Endocrinology

## 2021-01-23 DIAGNOSIS — E042 Nontoxic multinodular goiter: Secondary | ICD-10-CM

## 2021-01-24 ENCOUNTER — Other Ambulatory Visit: Payer: Self-pay

## 2021-01-24 ENCOUNTER — Other Ambulatory Visit (INDEPENDENT_AMBULATORY_CARE_PROVIDER_SITE_OTHER): Payer: Medicare Other

## 2021-01-24 DIAGNOSIS — E042 Nontoxic multinodular goiter: Secondary | ICD-10-CM | POA: Diagnosis not present

## 2021-01-24 LAB — TSH: TSH: 1 u[IU]/mL (ref 0.35–4.50)

## 2021-01-24 LAB — T3, FREE: T3, Free: 3.5 pg/mL (ref 2.3–4.2)

## 2021-01-24 LAB — T4, FREE: Free T4: 0.84 ng/dL (ref 0.60–1.60)

## 2021-01-28 ENCOUNTER — Ambulatory Visit (INDEPENDENT_AMBULATORY_CARE_PROVIDER_SITE_OTHER): Payer: Medicare Other | Admitting: Endocrinology

## 2021-01-28 ENCOUNTER — Other Ambulatory Visit: Payer: Self-pay

## 2021-01-28 VITALS — BP 136/62 | HR 70 | Ht 75.0 in | Wt 206.8 lb

## 2021-01-28 DIAGNOSIS — E042 Nontoxic multinodular goiter: Secondary | ICD-10-CM | POA: Diagnosis not present

## 2021-01-28 NOTE — Progress Notes (Signed)
Patient ID: Elijah [name], male   DOB: 02-15-1941, 80 y.o.   MRN: [id number]            Reason for Appointment: Follow-up of thyroid    History of Present Illness:   The patient's dominant left thyroid nodule was first discovered when he was having CT angiography for his cerebrovascular disease  The thyroid ultrasound on 05/14/16 showed the following Has a dominant nodule, location Left; Superior Size: Measures 3.7 x 2.0 x 4.3 cm, Composition: solid/almost completely solid (2) Echogenicity: isoechoic (1) Also has 6-10 nodules over 1 cm bilaterally  He was able to get a biopsy done of his dominant nodule in 11/2016 which was benign as follows Thyroid biopsy results:  LEFT UPPER POLE, FINE NEEDLE ASPIRATION (SPECIMEN 1 OF 1, COLLECTED 11/11/16): CONSISTENT WITH BENIGN FOLLICULAR NODULE (BETHESDA CATEGORY II).   RECENT history:  He was last seen in 01/2020  As before he does not receive any local pressure in the thyroid area or difficulty swallowing  Thyroid levels have been normal consistently  Ultrasound from June 2021 showed slight increase in size of the nodules #1 and 2 on the right side Also his left thyroid nodule appears smaller  He was not sent for biopsy of the nodule to be because of relatively mild change and being on chronic Xarelto   Lab Results  Component Value Date   FREET4 0.84 01/24/2021   FREET4 1.02 12/28/2019   FREET4 0.97 11/25/2017   TSH 1.00 01/24/2021   TSH 0.59 09/20/2020   TSH 0.96 12/28/2019   His last ultrasound was done in 6/21 showing the following changes  Nodule # 1:   Location: Isthmus; Inferior   Maximum size: 2.5 cm; Other 2 dimensions: 2.2 x 1.7 cm, previously, 2.1 x 1.6 x 1.2 cm   Composition: solid/almost completely solid (2)   Echogenicity: isoechoic (1)  Nodule # 2:   Location: Right; Mid   Maximum size: 2.8 cm; Other 2 dimensions: 2.7 x 2.0 cm, previously, 2.7 x 2.5 x 1.9 cm   Composition: solid/almost completely solid  (2)   Echogenicity: isoechoic (1)   Dominant left nodule: Previously biopsied nodule occupying the majority of the left mid gland measures 4.6 x 3.1 x 2.8 cm, slightly smaller   Allergies as of 01/28/2021       Reactions   Ciprofloxacin Nausea Only   Flagyl [metronidazole] Nausea Only        Medication List        Accurate as of January 28, 2021  1:44 PM. If you have any questions, ask your nurse or doctor.          acetaminophen 650 MG CR tablet Commonly known as: TYLENOL Take 650 mg by mouth 2 (two) times daily.   cholecalciferol 25 MCG (1000 UNIT) tablet Commonly known as: VITAMIN D3 Take 1,000 Units by mouth 2 (two) times daily.   famotidine 20 MG tablet Commonly known as: PEPCID TAKE 1 TABLET(20 MG) BY MOUTH TWICE DAILY   metoprolol succinate 25 MG 24 hr tablet Commonly known as: TOPROL-XL TAKE 1 TABLET BY MOUTH DAILY WITH OR IMMEDIATELY FOLLOWING A MEAL   multivitamin with minerals Tabs tablet Take 1 tablet by mouth daily.   simvastatin 20 MG tablet Commonly known as: ZOCOR TAKE 1 TABLET BY MOUTH EVERY DAY   Xarelto 20 MG Tabs tablet Generic drug: rivaroxaban TAKE 1 TABLET(20 MG) BY MOUTH DAILY        Allergies:  Allergies  Allergen Reactions  Ciprofloxacin Nausea Only   Flagyl [Metronidazole] Nausea Only    Past Medical History:  Diagnosis Date   Adenomatous colon polyp    Arthritis 09-10-11   rt. hip, knee, hands   Cancer ([place]) 09-10-11   dx. Prostate cancer-bx. done 12'12, surgery planned   Cataract    CKD (chronic kidney disease)    stage III (09/01/18)   Diverticulosis    Hemorrhoid    Hx of adenomatous colonic polyps 06/05/2011   Hyperlipidemia    Hypertension    Neuropathy of foot 09-10-11   bilateral ? etiology   Peripheral neuropathy    Permanent atrial fibrillation ([place]) 09/10/2011   Prostate cancer ([place])    Stroke ([place])     There is no history of radiation to the neck in childhood  Past Surgical History:  Procedure  Laterality Date   COLONOSCOPY  04/2003, 06/05/2011   diverticulosis, internal and external hemorrhoids 2004 and 2012, 2 small polyps 2012   EYE SURGERY  09-10-11   Only stitches to close cut, and observation of hematoma from injury, cataracts bilateral   INGUINAL HERNIA REPAIR Right 06/16/2019   Procedure: OPEN RIGHT INGUINAL HERNIA REPAIR;  Surgeon: Alphonsa Overall, MD;  Location: [facility];  Service: General;  Laterality: Right;  OPEN RIGHT INGUINAL HERNIA REPAIR   ROBOT ASSISTED LAPAROSCOPIC RADICAL PROSTATECTOMY  09/15/2011   Procedure: ROBOTIC ASSISTED LAPAROSCOPIC RADICAL PROSTATECTOMY LEVEL 2;  Surgeon: Dutch Gray, MD;  Location: [facility];  Service: Urology;  Laterality: N/A;         TONSILLECTOMY      Family History  Problem Relation Age of Onset   Diabetes Mother    Hypertension Mother    Diverticulosis Mother    Heart disease Father 61   Heart attack Father 67       MI   Prostate cancer Brother    Alzheimer's disease Paternal Grandmother    Heart attack Paternal Grandfather    Colon cancer Neg Hx     Social History:  reports that he quit smoking about 52 years ago. He has never used smokeless tobacco. He reports that he does not drink alcohol and does not use drugs.   Review of Systems:    Wt Readings from Last 3 Encounters:  01/28/21 206 lb 12.8 oz (93.8 kg)  09/20/20 207 lb 9.6 oz (94.2 kg)  07/04/20 208 lb 12.8 oz (94.7 kg)    Examination:   BP 136/62   Pulse 70   Ht 6\' 3"  (1.905 m)   Wt 206 lb 12.8 oz (93.8 kg)   SpO2 99%   BMI 25.85 kg/m           THYROID:  Right lobe of the thyroid is enlarged 3-3-1/2 times normal, smooth and slightly firm without any distinct nodule Mostly found on swallowing  Left lobe is also just palpable on swallowing, about twice normal and not distinctly palpable, no nodularity or firm areas  No lymphadenopathy in the neck Pemberton sign negative No stridor   Assessment/Plan:  Multinodular goiter with a benign dominant  left-sided nodule which was smaller on last exam, also benign with biopsy previously  Follow-up ultrasound last year showed slight increase in right-sided nodules #1 and #2 but not sufficient to consider biopsy clinically Also he is on Xarelto  He still has no symptoms of local pressure in the neck or difficulty swallowing  His thyroid exam is about the same especially on the right side  Will reassess his thyroid ultrasound again especially  with the right thyroid nodules and recommend biopsy only if there is a significant change   Elayne Snare 01/28/2021  Note: This office note was prepared with Dragon voice recognition system technology. Any transcriptional errors that result from this process are unintentional.

## 2021-02-03 ENCOUNTER — Other Ambulatory Visit: Payer: Self-pay | Admitting: Family Medicine

## 2021-02-14 ENCOUNTER — Ambulatory Visit
Admission: RE | Admit: 2021-02-14 | Discharge: 2021-02-14 | Disposition: A | Discharge status: Home / Self Care | Payer: Medicare Other | Source: Ambulatory Visit | Attending: Endocrinology | Admitting: Endocrinology

## 2021-02-14 DIAGNOSIS — E042 Nontoxic multinodular goiter: Secondary | ICD-10-CM

## 2021-02-14 DIAGNOSIS — E041 Nontoxic single thyroid nodule: Secondary | ICD-10-CM | POA: Diagnosis not present

## 2021-02-20 NOTE — Progress Notes (Signed)
Please call patient: Thyroid nodule has increased in size and radiologist recommends needle aspiration biopsy.  Will need to get approval for stopping Xarelto from his cardiologist because of his previous history of stroke.  If the risk of stopping Xarelto is more than minimal will not recommend biopsy

## 2021-02-22 ENCOUNTER — Telehealth: Payer: Self-pay

## 2021-02-22 NOTE — Telephone Encounter (Addendum)
Called and left a message for pt to call back and discuss results.  ----- Message from Elayne Snare, MD sent at 02/20/2021  3:42 PM EDT ----- Please call patient: Thyroid nodule has increased in size and radiologist recommends needle aspiration biopsy.  Will need to get approval for stopping Xarelto from his cardiologist because of his previous history of stroke.  If the risk of stopping Xarelto is more than minimal will not recommend biopsy

## 2021-02-27 DIAGNOSIS — L570 Actinic keratosis: Secondary | ICD-10-CM | POA: Diagnosis not present

## 2021-02-27 DIAGNOSIS — L821 Other seborrheic keratosis: Secondary | ICD-10-CM | POA: Diagnosis not present

## 2021-02-27 DIAGNOSIS — L820 Inflamed seborrheic keratosis: Secondary | ICD-10-CM | POA: Diagnosis not present

## 2021-02-27 DIAGNOSIS — Z85820 Personal history of malignant melanoma of skin: Secondary | ICD-10-CM | POA: Diagnosis not present

## 2021-02-27 DIAGNOSIS — Z08 Encounter for follow-up examination after completed treatment for malignant neoplasm: Secondary | ICD-10-CM | POA: Diagnosis not present

## 2021-02-27 DIAGNOSIS — X32XXXD Exposure to sunlight, subsequent encounter: Secondary | ICD-10-CM | POA: Diagnosis not present

## 2021-02-27 DIAGNOSIS — Z1283 Encounter for screening for malignant neoplasm of skin: Secondary | ICD-10-CM | POA: Diagnosis not present

## 2021-03-05 ENCOUNTER — Other Ambulatory Visit: Payer: Self-pay | Admitting: Family Medicine

## 2021-03-18 NOTE — Progress Notes (Signed)
Phone [phone] In person visit   Subjective:   Elijah [name] is a 80 y.o. year old very pleasant male patient who presents for/with See problem oriented charting Chief Complaint  Patient presents with   Hypertension   Hyperlipidemia   Atrial Fibrillation    This visit occurred during the SARS-CoV-2 public health emergency.  Safety protocols were in place, including screening questions prior to the visit, additional usage of staff PPE, and extensive cleaning of exam room while observing appropriate contact time as indicated for disinfecting solutions.   Past Medical History-  Patient Active Problem List   Diagnosis Date Noted   History of CVA (cerebrovascular accident) 05/14/2016    Priority: High   History of melanoma 12/04/2015    Priority: High   Atrial fibrillation ([place]) 04/06/2007    Priority: High   Hyperglycemia 08/13/2016    Priority: Medium   Thyroid nodule     Priority: Medium   GERD (gastroesophageal reflux disease) 12/04/2015    Priority: Medium   CKD (chronic kidney disease), stage III ([place]) 12/07/2014    Priority: Medium   History of prostate cancer 09/23/2012    Priority: Medium   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    Priority: Medium   Essential hypertension 04/07/2007    Priority: Medium   Hyperlipidemia 04/06/2007    Priority: Medium   Double vision 12/14/2017    Priority: Low   Gross hematuria 02/12/2017    Priority: Low   Osteoarthritis of left hip 08/09/2014    Priority: Low   Actinic keratosis 06/05/2014    Priority: Low   Hx of adenomatous colonic polyps 06/05/2011    Priority: Low    Medications- reviewed and updated Current Outpatient Medications  Medication Sig Dispense Refill   acetaminophen (TYLENOL) 650 MG CR tablet Take 650 mg by mouth 2 (two) times daily.     cholecalciferol (VITAMIN D3) 25 MCG (1000 UT) tablet Take 1,000 Units by mouth 2 (two) times daily.     famotidine (PEPCID) 20 MG tablet TAKE 1  TABLET(20 MG) BY MOUTH TWICE DAILY 180 tablet 3   metoprolol succinate (TOPROL-XL) 25 MG 24 hr tablet TAKE 1 TABLET BY MOUTH DAILY WITH OR IMMEDIATELY FOLLOWING A MEAL 90 tablet 3   Multiple Vitamin (MULTIVITAMIN WITH MINERALS) TABS tablet Take 1 tablet by mouth daily.     simvastatin (ZOCOR) 20 MG tablet TAKE 1 TABLET BY MOUTH EVERY DAY 90 tablet 1   XARELTO 20 MG TABS tablet TAKE 1 TABLET(20 MG) BY MOUTH DAILY 90 tablet 1   No current facility-administered medications for this visit.     Objective:  BP 124/60   Pulse 71   Temp 98 F (36.7 C) (Temporal)   Ht '6\' 3"'$  (1.905 m)   Wt 207 lb 9.6 oz (94.2 kg)   SpO2 98%   BMI 25.95 kg/m  Gen: NAD, resting comfortably CV: irregularly irregular, no murmurs rubs or gallops Lungs: CTAB no crackles, wheeze, rhonchi  Diabetic Foot Exam - Simple   Simple Foot Form Diabetic Foot exam was performed with the following findings: Yes 03/20/2021  8:14 AM  Visual Inspection See comments: Yes Sensation Testing See comments: Yes Pulse Check See comments: Yes Comments No sensation to monofilament until mid shin.  On left foot there is significant swelling around the first MTP joint extending into the right great toe.  Patient also with 1+ pitting edema on left ankle.  He is compliant with anticoagulation  Assessment and Plan    # multinodular goiter - visit by Dr.Kumar (01/28/2021) S:patient's dominant left thyroid nodule was first discovered when he was having CT angiography for his cerebrovascular disease.  - (05/14/16) -Has a dominant nodule, location Left; Superior Size: Measures 3.7 x 2.0 x 4.3 cm, Composition: solid/almost completely solid (2) Echogenicity: isoechoic (1) Also has 6-10 nodules over 1 cm bilaterally  -He gotten a biopsy done of his dominant nodule in 11/2016 which was benign as followed: Thyroid biopsy results:  LEFT UPPER POLE, FINE NEEDLE ASPIRATION (SPECIMEN 1 OF 1, COLLECTED 11/11/16): CONSISTENT WITH BENIGN  FOLLICULAR NODULE (BETHESDA CATEGORY II).  -last phone note from endocrinology "Please call patient: Thyroid nodule has increased in size and radiologist recommends needle aspiration biopsy.  Will need to get approval for stopping Xarelto from his cardiologist because of his previous history of stroke.  If the risk of stopping Xarelto is more than minimal will not recommend biopsy " on 02/22/21- voicemail was left for patient but I do not see contact has been made A/P: sent the following note to Dr. Dwyane Dee "Dr. Dwyane Dee. patient sees cardiology for a fib but i prescribe his xarelto. for biopsy of thyroid nodule I am ok with him holding xarelto day before and day of procedure. he understands there is some increased stroke risk but we thought benefits outweighed risks given growth in nodule. "   #Atrial fibrillation S: Patient compliant with metoprolol 25 mg daily XR for rate control. Also uses Xarelto 20 mg daily for anticoagulation. A/P:  appropriately anticoagulated and rate controlled- continue current rx- see discussion on stroke risk above - check creatinine clearance today and if below 50 again- we discussed reducing to 15 mg of xarelto  #Hyperlipidemia/history of stroke with LDL goal under 70 though likely was embolic S: Compliant with simvastatin 20 mg daily. LDL most recent at goal was under 70 Lab Results  Component Value Date   CHOL 113 09/20/2020   HDL 37.10 (L) 09/20/2020   LDLCALC 54 09/20/2020   LDLDIRECT 74.0 03/09/2019   TRIG 109.0 09/20/2020   CHOLHDL 3 09/20/2020   A/P:  well controlled with LDL under 70- continue current rx   #Hypertension S: Compliant with metoprolol though primarily for rate control Home readings #s: does not check BP Readings from Last 3 Encounters:  03/20/21 124/60  01/28/21 136/62  09/20/20 128/70  A/P: doing well- continue current rx  #CKD stage III S: GFR has been in the 40s to low 50s. Patient knows to avoid NSAIDs-also particularly with  anticoagulation. Would prefer H2 blockers over PPI- he was able to switch to pepcid 20 mg twice daily A/P: hopefully stable- update CMP today.    # Hyperglycemia- had elevated A1c in the past and updated with labs  Exercise and diet- was going to the [employer] 5-6 days a week- was eating reasonably healthy Lab Results  Component Value Date   HGBA1C 5.9 09/20/2020   HGBA1C 5.9 (H) 03/15/2020   HGBA1C 6.1 09/14/2019  A/P: hopefully stable- update a1c today  # idiopathic neuropathy- does not want to use medication - big issue with numbness.can get pain at times  #Left great toe swelling-patient reports swelling in left great toe for at least a year-I looked back in the notes and he had an injury/fall in 2015 as well.  He denies pain but does have neuropathy with decreased sensation-the MTP joint of great toe is significantly swollen -I recommended referral to podiatry for further evaluation and he is  agreeable  # GERD- famotidine helpful. Better choice with CKD Stage III than PPI  A/P: doing well- continue current meds   #History of prostate cancer-last update was following with Dr. Alinda Money after robotic prostatectomy- states will have 10 years cancer free come visit in January- will get PSA with them Lab Results  Component Value Date   PSA <0.015 08/18/2018   PSA <0.015 08/13/2016   PSA 0.01 (L) 10/18/2013   #history of melanoma- followed up every 6 months with dermatology    #Low back pain- 1 week of issues- improving now- wants to hold off on PT or sports med for now. No fall or injury. Certain movements other this. Advised trial of heat 15-20 minutes 3 x a week. Lateral hips also bother him.  - Has seen Dr. Tamala Julian in past as well- offered referral back- wants to hold off unless fails to continue to improve -no midline pain on exam. No incontinence. No worsening neuropthy since this started.   Recommended follow up: Return in about 6 months (around 09/20/2021) for follow-up or sooner if  needed. Future Appointments  Date Time Provider [place]  04/10/2021 11:40 AM Shirley Friar, PA-C [facility]  01/28/2022 11:15 AM Elayne Snare, MD [facility] None   Lab/Order associations:   ICD-10-CM   1. Hyperlipidemia, unspecified hyperlipidemia type  E78.5 CBC with Differential/Platelet    Comprehensive metabolic panel    2. Essential hypertension  I10     3. Permanent atrial fibrillation (HCC)  I48.21     4. Stage 3 chronic kidney disease, unspecified whether stage 3a or 3b CKD (HCC)  N18.30     5. Hyperglycemia  R73.9 Hemoglobin A1c    6. Gastroesophageal reflux disease without esophagitis  K21.9     7. Swelling of toe of left foot  M79.89 Ambulatory referral to [facility] as a scribe for [name], MD.,have documented all relevant documentation on the behalf of [name], MD,as directed by  [name], MD while in the presence of [name], MD.  I, [name], MD, have reviewed all documentation for this visit. The documentation on 03/20/21 for the exam, diagnosis, procedures, and orders are all accurate and complete.  Return precautions advised.  [name], MD

## 2021-03-20 ENCOUNTER — Other Ambulatory Visit: Payer: Self-pay | Admitting: Endocrinology

## 2021-03-20 ENCOUNTER — Ambulatory Visit (INDEPENDENT_AMBULATORY_CARE_PROVIDER_SITE_OTHER): Payer: Medicare Other | Admitting: Family Medicine

## 2021-03-20 ENCOUNTER — Other Ambulatory Visit: Payer: Self-pay

## 2021-03-20 ENCOUNTER — Encounter: Payer: Self-pay | Admitting: Family Medicine

## 2021-03-20 VITALS — BP 124/60 | HR 71 | Temp 98.0°F | Ht 75.0 in | Wt 207.6 lb

## 2021-03-20 DIAGNOSIS — I4821 Permanent atrial fibrillation: Secondary | ICD-10-CM | POA: Diagnosis not present

## 2021-03-20 DIAGNOSIS — N183 Chronic kidney disease, stage 3 unspecified: Secondary | ICD-10-CM

## 2021-03-20 DIAGNOSIS — I10 Essential (primary) hypertension: Secondary | ICD-10-CM | POA: Diagnosis not present

## 2021-03-20 DIAGNOSIS — K219 Gastro-esophageal reflux disease without esophagitis: Secondary | ICD-10-CM | POA: Diagnosis not present

## 2021-03-20 DIAGNOSIS — M7989 Other specified soft tissue disorders: Secondary | ICD-10-CM | POA: Diagnosis not present

## 2021-03-20 DIAGNOSIS — E042 Nontoxic multinodular goiter: Secondary | ICD-10-CM

## 2021-03-20 DIAGNOSIS — R739 Hyperglycemia, unspecified: Secondary | ICD-10-CM | POA: Diagnosis not present

## 2021-03-20 DIAGNOSIS — E785 Hyperlipidemia, unspecified: Secondary | ICD-10-CM

## 2021-03-20 DIAGNOSIS — E041 Nontoxic single thyroid nodule: Secondary | ICD-10-CM

## 2021-03-20 LAB — COMPREHENSIVE METABOLIC PANEL
ALT: 20 U/L (ref 0–53)
AST: 19 U/L (ref 0–37)
Albumin: 4.1 g/dL (ref 3.5–5.2)
Alkaline Phosphatase: 89 U/L (ref 39–117)
BUN: 24 mg/dL — ABNORMAL HIGH (ref 6–23)
CO2: 26 mEq/L (ref 19–32)
Calcium: 9.8 mg/dL (ref 8.4–10.5)
Chloride: 106 mEq/L (ref 96–112)
Creatinine, Ser: 1.45 mg/dL (ref 0.40–1.50)
GFR: 45.77 mL/min — ABNORMAL LOW (ref >60.00)
Glucose, Bld: 78 mg/dL (ref 70–99)
Potassium: 4.8 mEq/L (ref 3.5–5.1)
Sodium: 140 mEq/L (ref 135–145)
Total Bilirubin: 0.6 mg/dL (ref 0.2–1.2)
Total Protein: 6.8 g/dL (ref 6.0–8.3)

## 2021-03-20 LAB — CBC WITH DIFFERENTIAL/PLATELET
Basophils Absolute: 0.1 10*3/uL (ref 0.0–0.1)
Basophils Relative: 1.2 % (ref 0.0–3.0)
Eosinophils Absolute: 0.2 10*3/uL (ref 0.0–0.7)
Eosinophils Relative: 2.3 % (ref 0.0–5.0)
HCT: 44.2 % (ref 39.0–52.0)
Hemoglobin: 15 g/dL (ref 13.0–17.0)
Lymphocytes Relative: 17.5 % (ref 12.0–46.0)
Lymphs Abs: 1.2 10*3/uL (ref 0.7–4.0)
MCHC: 33.9 g/dL (ref 30.0–36.0)
MCV: 94 fl (ref 78.0–100.0)
Monocytes Absolute: 0.7 10*3/uL (ref 0.1–1.0)
Monocytes Relative: 10.6 % (ref 3.0–12.0)
Neutro Abs: 4.5 10*3/uL (ref 1.4–7.7)
Neutrophils Relative %: 68.4 % (ref 43.0–77.0)
Platelets: 196 10*3/uL (ref 150.0–400.0)
RBC: 4.71 Mil/uL (ref 4.22–5.81)
RDW: 13.3 % (ref 11.5–15.5)
WBC: 6.6 10*3/uL (ref 4.0–10.5)

## 2021-03-20 LAB — HEMOGLOBIN A1C: Hgb A1c MFr Bld: 6 % (ref 4.6–6.5)

## 2021-03-20 NOTE — Patient Instructions (Addendum)
Health Maintenance Due  Topic Date Due   Zoster Vaccines- Shingrix (1 of 2)   -Please check with your pharmacy to see if they have the shingrix vaccine. If they do- please get this immunization and update [place] by phone call or mychart with dates you receive the vaccine   Never done   INFLUENZA VACCINE   - Please consider getting your flu shot in the Fall. If you get this outside of our office, please let us know.  03/04/2021   Please stop by lab before you go If you have mychart- we will send your results within 3 business days of [place] receiving them.  If you do not have mychart- we will call you about results within 5 business days of [place] receiving them.  *please also note that you will see labs on mychart as soon as they post. I will later go in and write notes on them- will say "notes from Dr. [name]"  We will call you within two weeks about your referral to Podiatry. If you do not hear within 2 weeks, give [place] a call.   Reach out to [place] if hip/back pain fails to improve and we will reach out to sports medicine with referral   Recommended follow up: Return in about 6 months (around 09/20/2021) for follow-up or sooner if needed.

## 2021-03-22 ENCOUNTER — Telehealth: Payer: Self-pay

## 2021-03-25 ENCOUNTER — Encounter: Payer: Self-pay | Admitting: Podiatrist

## 2021-03-25 ENCOUNTER — Other Ambulatory Visit: Payer: Self-pay

## 2021-03-25 ENCOUNTER — Other Ambulatory Visit: Payer: Self-pay | Admitting: Podiatrist

## 2021-03-25 ENCOUNTER — Ambulatory Visit (INDEPENDENT_AMBULATORY_CARE_PROVIDER_SITE_OTHER): Payer: Medicare Other

## 2021-03-25 ENCOUNTER — Ambulatory Visit (INDEPENDENT_AMBULATORY_CARE_PROVIDER_SITE_OTHER): Payer: Medicare Other | Admitting: Podiatrist

## 2021-03-25 DIAGNOSIS — M1990 Unspecified osteoarthritis, unspecified site: Secondary | ICD-10-CM

## 2021-03-25 DIAGNOSIS — M19072 Primary osteoarthritis, left ankle and foot: Secondary | ICD-10-CM

## 2021-03-25 DIAGNOSIS — M19079 Primary osteoarthritis, unspecified ankle and foot: Secondary | ICD-10-CM | POA: Diagnosis not present

## 2021-03-25 DIAGNOSIS — R2242 Localized swelling, mass and lump, left lower limb: Secondary | ICD-10-CM | POA: Diagnosis not present

## 2021-03-25 DIAGNOSIS — M79672 Pain in left foot: Secondary | ICD-10-CM

## 2021-03-25 NOTE — Patient Instructions (Signed)
Hallux Rigidus  Hallux rigidus is a type of joint pain or joint disease (arthritis) that affects your big toe (hallux). This condition involves the joint that connects the base of your big toe to the main part of your foot (metatarsophalangeal joint or MTP joint). This condition can cause your big toe to become stiff, painful, and difficult to move. Symptoms may get worse with movement or in cold or damp weather. Thecondition gets worse over time. What are the causes? This condition may be caused by having a foot that does not function the way that it should or that has an abnormal shape (structural deformity). These foot problems can run in families and may be passed down from parents to children (are hereditary). This condition can also be caused by: Injury. Overuse. Certain inflammatory diseases, including gout and rheumatoid arthritis. What increases the risk? You are more likely to develop this condition if you have: A foot bone (metatarsal) that is longer or higher than normal. A family history of hallux rigidus. Previously injured your big toe. Feet that do not have a curve (arch) on the inner side of the foot. This may be called flat feet or fallen arches. Ankles that turn in when you walk (pronation). Rheumatoid arthritis or gout. A job that requires you to stoop down often at work. What are the signs or symptoms? Symptoms of this condition include: Big toe pain. Stiffness and difficulty moving the big toe. Swelling of the toe and surrounding area. Bone spurs. These are bony growths that can form on the joint of the big toe. A limp. How is this diagnosed? This condition is diagnosed based on your medical history and a physical exam.You may also have X-rays. How is this treated? This condition is treated by: Wearing roomy, comfortable shoes that have a large toe box. Putting orthotic devices in your shoes. Taking pain medicines. Having physical therapy. Icing the injured  area. Alternating between putting your foot in cold water and then in warm water. If your condition is severe, treatment may include: Corticosteroid injections to relieve pain. Surgery to remove bone spurs, fuse damaged bones together, or replace the entire joint. Follow these instructions at home: Managing pain, stiffness, and swelling  Put your feet in cold water for 30 seconds, and then in warm water for 30 seconds. Alternate between the cold and warm water for 5 minutes. Do this several times a day or as told by your health care provider. If directed, put ice on the injured area. Put ice in a plastic bag. Place a towel between your skin and the bag. Leave the ice on for 20 minutes, 2-3 times a day.  General instructions Take over-the-counter and prescription medicines only as told by your health care provider. Do not wear high heels or other restrictive footwear. Wear comfortable, supportive shoes that have a large toe box. Wear shoe inserts (orthotics) as told by your health care provider, if this applies. Do foot exercises as instructed by your health care provider or a physical therapist. Keep all follow-up visits as told by your health care provider. This is important. Contact a health care provider if: You notice bone spurs or growths on or around your big toe. Your pain does not get better or it gets worse. You have pain while resting. You have pain in other parts of your body, such as your back, hip, or knee. You start to limp. Summary Hallux rigidus is a condition that makes your big toe become stiff, painful,  and difficult to move. It can be caused by injury, overuse, or inflammatory diseases. This condition may be treated with ice, medicines, physical therapy, and surgery. Do not wear high heels or other restrictive footwear. Wear comfortable, supportive shoes that have a large toe box. This information is not intended to replace advice given to you by your health care  provider. Make sure you discuss any questions you have with your healthcare provider. Document Revised: 04/30/2018 Document Reviewed: 05/03/2018 Elsevier Patient Education  2022 [employer].

## 2021-03-25 NOTE — Progress Notes (Signed)
Chief Complaint  Patient presents with   Foot Pain    Left foot pain Swelling  Pt stated that he has neuropathy      HPI: Patient is 80 y.o. male who presents today for the concerns as listed above.  He relates localized swelling to the left foot for several years duration. States he has neuropathy and the foot does not hurt him-  he wanted to get the swelling checked out.   Patient Active Problem List   Diagnosis Date Noted   Double vision 12/14/2017   Gross hematuria 02/12/2017   Hyperglycemia 08/13/2016   History of CVA (cerebrovascular accident) 05/14/2016   Thyroid nodule    History of melanoma 12/04/2015   GERD (gastroesophageal reflux disease) 12/04/2015   CKD (chronic kidney disease), stage III ([place]) 12/07/2014   Osteoarthritis of left hip 08/09/2014   Actinic keratosis 06/05/2014   History of prostate cancer 09/23/2012   Hx of adenomatous colonic polyps 06/05/2011   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   Essential hypertension 04/07/2007   Hyperlipidemia 04/06/2007   Atrial fibrillation ([place]) 04/06/2007    Current Outpatient Medications on File Prior to Visit  Medication Sig Dispense Refill   acetaminophen (TYLENOL) 650 MG CR tablet Take 650 mg by mouth 2 (two) times daily.     cholecalciferol (VITAMIN D3) 25 MCG (1000 UT) tablet Take 1,000 Units by mouth 2 (two) times daily.     famotidine (PEPCID) 20 MG tablet TAKE 1 TABLET(20 MG) BY MOUTH TWICE DAILY 180 tablet 3   metoprolol succinate (TOPROL-XL) 25 MG 24 hr tablet TAKE 1 TABLET BY MOUTH DAILY WITH OR IMMEDIATELY FOLLOWING A MEAL 90 tablet 3   Multiple Vitamin (MULTIVITAMIN WITH MINERALS) TABS tablet Take 1 tablet by mouth daily.     simvastatin (ZOCOR) 20 MG tablet TAKE 1 TABLET BY MOUTH EVERY DAY 90 tablet 1   XARELTO 20 MG TABS tablet TAKE 1 TABLET(20 MG) BY MOUTH DAILY 90 tablet 1   No current facility-administered medications on file prior to visit.    Allergies  Allergen Reactions    Ciprofloxacin Nausea Only   Flagyl [Metronidazole] Nausea Only    Review of Systems No fevers, chills, nausea, muscle aches, no difficulty breathing, no calf pain, no chest pain or shortness of breath.   Physical Exam  GENERAL APPEARANCE: Alert, conversant. Appropriately groomed. No acute distress.   VASCULAR: Pedal pulses palpable DP and PT bilateral.  Capillary refill time is immediate to all digits,  Proximal to distal cooling it warm to warm.  Digital perfusion adequate.   NEUROLOGIC: sensation is absent epicritically and protectively to both feet.  0/10 via SWMF b/l and absent vibratory sensation noted.    MUSCULOSKELETAL: acceptable muscle strength, tone and stability bilateral.  Decreased range of motion with swelling over the first metatarsal phalangeal joint is noted left.  Notable exostosis is also noted at the medial midfoot left foot.    DERMATOLOGIC: skin is warm, supple, and dry.  No open lesions noted.  No rash, no pre ulcerative lesions. Digital nails are asymptomatic.   Generalized swelling of the entire forefoot and digits is seen left.   Xrays:  3 views of the left foot obtained.  Decreased joint space, swelling and boney exostisis noted at the first metatarsal phalangeal joint.  Arthritic changes seen at the midfoot as well.    Assessment     ICD-10-CM   1. Left foot pain  M79.672 DG Foot Complete Left    2. Arthritis  of first metatarsophalangeal (MTP) joint of left foot  M19.072     3. Localized swelling of left foot  R22.42     4. Arthritis of midfoot  M19.079        Plan  Discussed exam and xray findings and discussed the swelling of locally the left foot is likely from the arthritis in the left foot.  Discussed surgery could be considered, however since the foot is not painful, I would advise against surgery.  Discussed he could try compression hose if the swelling is bothersome.  Also recommended an arch support to support the arch more which was  dispensed for his use today.  Advised that if the swelling gets worse he should be seen again, otherwise he will be back as needed for follow up.

## 2021-04-10 ENCOUNTER — Other Ambulatory Visit: Payer: Self-pay

## 2021-04-10 ENCOUNTER — Ambulatory Visit (INDEPENDENT_AMBULATORY_CARE_PROVIDER_SITE_OTHER): Payer: Medicare Other | Admitting: Student

## 2021-04-10 ENCOUNTER — Ambulatory Visit: Payer: Medicare Other | Admitting: Physician Assistant

## 2021-04-10 ENCOUNTER — Encounter: Payer: Self-pay | Admitting: Student

## 2021-04-10 VITALS — BP 130/80 | HR 60 | Ht 74.0 in | Wt 213.0 lb

## 2021-04-10 DIAGNOSIS — I10 Essential (primary) hypertension: Secondary | ICD-10-CM

## 2021-04-10 DIAGNOSIS — I4821 Permanent atrial fibrillation: Secondary | ICD-10-CM | POA: Diagnosis not present

## 2021-04-10 NOTE — Patient Instructions (Signed)
Medication Instructions:  Your physician recommends that you continue on your current medications as directed. Please refer to the Current Medication list given to you today.  *If you need a refill on your cardiac medications before your next appointment, please call your pharmacy*   Lab Work: None If you have labs (blood work) drawn today and your tests are completely normal, you will receive your results only by: [place] (if you have MyChart) OR A paper copy in the mail If you have any lab test that is abnormal or we need to change your treatment, we will call you to review the results.   Follow-Up: At [facility], you and your health needs are our priority.  As part of our continuing mission to provide you with exceptional heart care, we have created designated Provider Care Teams.  These Care Teams include your primary Cardiologist (physician) and Advanced Practice Providers (APPs -  Physician Assistants and Nurse Practitioners) who all work together to provide you with the care you need, when you need it.   Your next appointment:   1 year(s)  The format for your next appointment:   In Person  Provider:   You may see Thompson Grayer, MD or one of the following Advanced Practice Providers on your designated Care Team:   Tommye Standard, [place] Legrand Como "[facility]" [place], [place]

## 2021-04-10 NOTE — Progress Notes (Signed)
PCP:  [name], MD Primary Cardiologist: None Electrophysiologist: Thompson Grayer, MD   Elijah [name] is a 80 y.o. male seen today for Thompson Grayer, MD for routine electrophysiology followup.  Since last being seen in our clinic the patient reports doing very well overall. No symptoms of fast or slow HRs.  he denies chest pain, palpitations, dyspnea, PND, orthopnea, nausea, vomiting, dizziness, syncope, edema, weight gain, or early satiety.  Past Medical History:  Diagnosis Date   Adenomatous colon polyp    Arthritis 09-10-11   rt. hip, knee, hands   Cancer ([place]) 09-10-11   dx. Prostate cancer-bx. done 12'12, surgery planned   Cataract    CKD (chronic kidney disease)    stage III (09/01/18)   Diverticulosis    Hemorrhoid    Hx of adenomatous colonic polyps 06/05/2011   Hyperlipidemia    Hypertension    Neuropathy of foot 09-10-11   bilateral ? etiology   Peripheral neuropathy    Permanent atrial fibrillation ([place]) 09/10/2011   Prostate cancer ([place])    Stroke [facility])    Past Surgical History:  Procedure Laterality Date   COLONOSCOPY  04/2003, 06/05/2011   diverticulosis, internal and external hemorrhoids 2004 and 2012, 2 small polyps 2012   EYE SURGERY  09-10-11   Only stitches to close cut, and observation of hematoma from injury, cataracts bilateral   INGUINAL HERNIA REPAIR Right 06/16/2019   Procedure: OPEN RIGHT INGUINAL HERNIA REPAIR;  Surgeon: Alphonsa Overall, MD;  Location: [facility];  Service: General;  Laterality: Right;  OPEN RIGHT INGUINAL HERNIA REPAIR   ROBOT ASSISTED LAPAROSCOPIC RADICAL PROSTATECTOMY  09/15/2011   Procedure: ROBOTIC ASSISTED [place] 2;  Surgeon: Dutch Gray, MD;  Location: [facility];  Service: Urology;  Laterality: N/A;         TONSILLECTOMY      Current Outpatient Medications  Medication Sig Dispense Refill   acetaminophen (TYLENOL) 650 MG CR tablet Take 650 mg by mouth 2 (two) times daily.     cholecalciferol (VITAMIN  D3) 25 MCG (1000 UT) tablet Take 1,000 Units by mouth 2 (two) times daily.     famotidine (PEPCID) 20 MG tablet TAKE 1 TABLET(20 MG) BY MOUTH TWICE DAILY 180 tablet 3   metoprolol succinate (TOPROL-XL) 25 MG 24 hr tablet TAKE 1 TABLET BY MOUTH DAILY WITH OR IMMEDIATELY FOLLOWING A MEAL 90 tablet 3   Multiple Vitamin (MULTIVITAMIN WITH MINERALS) TABS tablet Take 1 tablet by mouth daily.     simvastatin (ZOCOR) 20 MG tablet TAKE 1 TABLET BY MOUTH EVERY DAY 90 tablet 1   XARELTO 20 MG TABS tablet TAKE 1 TABLET(20 MG) BY MOUTH DAILY 90 tablet 1   No current facility-administered medications for this visit.    Allergies  Allergen Reactions   Ciprofloxacin Nausea Only   Flagyl [Metronidazole] Nausea Only    Social History   Socioeconomic History   Marital status: Married    Spouse name: Not on file   Number of children: 3   Years of education: Not on file   Highest education level: Not on file  Occupational History   Occupation: Surveyor, mining: RETIRED  Tobacco Use   Smoking status: Former    Types: Cigarettes    Quit date: 08/04/1968    Years since quitting: 52.7   Smokeless tobacco: Never  Vaping Use   Vaping Use: Never used  Substance and Sexual Activity   Alcohol use: No    Comment: none  in 20 yrs   Drug use: No   Sexual activity: Yes  Other Topics Concern   Not on file  Social History Narrative   Married with 3 kids. 6 grandkids.       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       Hobbies: beach (place at [employer]) usually every 2 weeks.    Social Determinants of Health   Financial Resource Strain: Not on file  Food Insecurity: Not on file  Transportation Needs: Not on file  Physical Activity: Not on file  Stress: Not on file  Social Connections: Not on file  Intimate Partner Violence: Not on file   Review of Systems: All other systems reviewed and are otherwise negative except as noted above.  Physical Exam: Vitals:    04/10/21 1146  BP: 130/80  Pulse: 60  SpO2: 98%  Weight: 213 lb (96.6 kg)  Height: '6\' 2"'$  (1.88 m)    GEN- The patient is well appearing, alert and oriented x 3 today.   HEENT: normocephalic, atraumatic; sclera clear, conjunctiva pink; hearing intact; oropharynx clear; neck supple, no JVP Lymph- no cervical lymphadenopathy Lungs- Clear to ausculation bilaterally, normal work of breathing.  No wheezes, rales, rhonchi Heart- Irregularly irregular rate and rhythm, no murmurs, rubs or gallops, PMI not laterally displaced GI- soft, non-tender, non-distended, bowel sounds present, no hepatosplenomegaly Extremities- no clubbing, cyanosis, or edema; DP/PT/radial pulses 2+ bilaterally MS- no significant deformity or atrophy Skin- warm and dry, no rash or lesion Psych- euthymic mood, full affect Neuro- strength and sensation are intact  EKG is ordered. Personal review of EKG from today shows atrial fibrillation with controlled rates  Additional studies reviewed include: Previous EP office notes.   Assessment and Plan:  1. Permanent AF  Rate controlled and asymptomatic Continue Xarelto for CHA2DS2-VASc of at least 4. CHA2DS2/VAS Stroke Risk Points  Current as of 2 hours ago Labs last month looked stable.    2. HTN Stable on current regimen   Continue annual follow up. He follows up with PCP about every 6 months.  Shirley Friar, PA-C  04/10/21 12:00 PM

## 2021-04-25 ENCOUNTER — Ambulatory Visit
Admission: RE | Admit: 2021-04-25 | Discharge: 2021-04-25 | Disposition: A | Discharge status: Home / Self Care | Payer: Medicare Other | Source: Ambulatory Visit | Attending: Endocrinology | Admitting: Endocrinology

## 2021-04-25 ENCOUNTER — Other Ambulatory Visit (HOSPITAL_COMMUNITY)
Admission: RE | Admit: 2021-04-25 | Discharge: 2021-04-25 | Disposition: A | Discharge status: Home / Self Care | Payer: Medicare Other | Source: Ambulatory Visit | Attending: Endocrinology | Admitting: Endocrinology

## 2021-04-25 DIAGNOSIS — E041 Nontoxic single thyroid nodule: Secondary | ICD-10-CM | POA: Insufficient documentation

## 2021-04-29 LAB — CYTOLOGY - NON PAP

## 2021-05-14 ENCOUNTER — Telehealth: Payer: Self-pay | Admitting: Endocrinology

## 2021-05-14 NOTE — Telephone Encounter (Signed)
Patient has been notified and verbalized understanding 

## 2021-05-14 NOTE — Telephone Encounter (Signed)
Please let him know that his genomic sequencing/special testing on his thyroid biopsy was benign, will see him back in 6 months

## 2021-05-14 NOTE — Telephone Encounter (Signed)
Vm left for patient to call back.

## 2021-05-20 ENCOUNTER — Other Ambulatory Visit: Payer: Self-pay

## 2021-05-20 ENCOUNTER — Telehealth: Payer: Self-pay | Admitting: Internal Medicine

## 2021-05-20 NOTE — Telephone Encounter (Signed)
Pt stating that he is having bleeding with larger BM. States that there is no bleeding with small BM. Pt states that this started about two weeks ago and thinks that its is his hemorrhoids . Appointment for an OV scheduled to see Dr. Carlean Purl scheduled for 05/22/2021 @ 1:50. Pt made aware. Pt verbalized understanding with all questions answered.

## 2021-05-20 NOTE — Telephone Encounter (Signed)
Patient called states he is having a lot of blood come out when he goes to the bathroom not sure if it is associated with hemorrhoids seeking advise.

## 2021-05-22 ENCOUNTER — Ambulatory Visit (INDEPENDENT_AMBULATORY_CARE_PROVIDER_SITE_OTHER): Payer: Medicare Other | Admitting: Internal Medicine

## 2021-05-22 ENCOUNTER — Encounter: Payer: Self-pay | Admitting: Internal Medicine

## 2021-05-22 VITALS — BP 126/68 | HR 72 | Ht 75.0 in | Wt 205.0 lb

## 2021-05-22 DIAGNOSIS — R110 Nausea: Secondary | ICD-10-CM | POA: Diagnosis not present

## 2021-05-22 DIAGNOSIS — K649 Unspecified hemorrhoids: Secondary | ICD-10-CM | POA: Diagnosis not present

## 2021-05-22 MED ORDER — HYDROCORTISONE ACETATE 25 MG RE SUPP: 25.0000 mg | Freq: Every evening | RECTAL | 0 refills | Status: DC | PRN

## 2021-05-22 NOTE — Progress Notes (Signed)
Elijah [name] 80 y.o. Oct 25, 1940 [id number]  Assessment & Plan:   Encounter Diagnoses  Name Primary?   Bleeding hemorrhoids Yes   Nausea without vomiting      Hc supps qhs x 5 then prn Benefiber 1tbsp daily Stop cereal in the mornings and try to eat yogurt or eggs and look at his food intake to see if he can find out what is triggering this postprandial nausea  Subjective:   Chief Complaint: Rectal bleeding  HPI Patient is complaining of recent bright red rectal bleeding when bowel movements are larger. Also fecal smearing at times.  This is episodic and random.  Colonoscopy was performed in August 2020 and demonstrated inflamed hemorrhoids and proctitis versus prep irritation and diminutive SSP.  Rectal biopsies were normal.  No recall planned given age  Other complaints include postprandial nausea in the hours after he eats breakfast.  He tends to eat cereal for breakfast.  He wonders if that could represent a significant problem i.e. the nausea.  He continues to take Xarelto for history of A. fib  Wt Readings from Last 3 Encounters:  05/22/21 205 lb (93 kg)  04/10/21 213 lb (96.6 kg)  03/20/21 207 lb 9.6 oz (94.2 kg)      Allergies  Allergen Reactions   Ciprofloxacin Nausea Only   Flagyl [Metronidazole] Nausea Only   Current Meds  Medication Sig   acetaminophen (TYLENOL) 650 MG CR tablet Take 650 mg by mouth 2 (two) times daily.   cholecalciferol (VITAMIN D3) 25 MCG (1000 UT) tablet Take 1,000 Units by mouth 2 (two) times daily.   famotidine (PEPCID) 20 MG tablet TAKE 1 TABLET(20 MG) BY MOUTH TWICE DAILY   hydrocortisone (ANUSOL-HC) 25 MG suppository Place 1 suppository (25 mg total) rectally at bedtime as needed for hemorrhoids or anal itching.   metoprolol succinate (TOPROL-XL) 25 MG 24 hr tablet TAKE 1 TABLET BY MOUTH DAILY WITH OR IMMEDIATELY FOLLOWING A MEAL   Multiple Vitamin (MULTIVITAMIN WITH MINERALS) TABS tablet Take 1 tablet by mouth daily.    simvastatin (ZOCOR) 20 MG tablet TAKE 1 TABLET BY MOUTH EVERY DAY   XARELTO 20 MG TABS tablet TAKE 1 TABLET(20 MG) BY MOUTH DAILY   Past Medical History:  Diagnosis Date   Adenomatous colon polyp    Arthritis 09-10-11   rt. hip, knee, hands   Cancer ([place]) 09-10-11   dx. Prostate cancer-bx. done 12'12, surgery planned   Cataract    CKD (chronic kidney disease)    stage III (09/01/18)   Diverticulosis    Hemorrhoid    Hx of adenomatous colonic polyps 06/05/2011   Hyperlipidemia    Hypertension    Neuropathy of foot 09-10-11   bilateral ? etiology   Peripheral neuropathy    Permanent atrial fibrillation ([place]) 09/10/2011   Prostate cancer ([place])    Stroke [facility])    Past Surgical History:  Procedure Laterality Date   COLONOSCOPY  04/2003, 06/05/2011   diverticulosis, internal and external hemorrhoids 2004 and 2012, 2 small polyps 2012   EYE SURGERY  09-10-11   Only stitches to close cut, and observation of hematoma from injury, cataracts bilateral   INGUINAL HERNIA REPAIR Right 06/16/2019   Procedure: OPEN RIGHT INGUINAL HERNIA REPAIR;  Surgeon: Alphonsa Overall, MD;  Location: [facility];  Service: General;  Laterality: Right;  OPEN RIGHT INGUINAL HERNIA REPAIR   ROBOT ASSISTED LAPAROSCOPIC RADICAL PROSTATECTOMY  09/15/2011   Procedure: ROBOTIC ASSISTED [place] 2;  Surgeon: Dutch Gray, MD;  [facility];  Service: Urology;  Laterality: N/A;         TONSILLECTOMY     Social History   Social History Narrative   Married with 3 kids. 6 grandkids.       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       Hobbies: beach (place at [employer]) usually every 2 weeks.    family history includes Alzheimer's disease in his paternal grandmother; Diabetes in his mother; Diverticulosis in his mother; Heart attack in his paternal grandfather; Heart attack (age of onset: 45) in his father; Hypertension in his mother; Prostate cancer in his  brother.   Review of Systems As above  Objective:   Physical Exam BP 126/68   Pulse 72   Ht 6\' 3"  (1.905 m)   Wt 205 lb (93 kg)   BMI 25.62 kg/m  Rectal  NL DRE  Anoscopy Gr 2 prolapsed internal hemorrhoids w/ stigmata of recent bleeding

## 2021-05-22 NOTE — Patient Instructions (Signed)
We are providing you with a printed rx today for hydrocortisone suppositories.  Try using [facility] to get these. Use the suppository nightly for 5 nights and then as needed.  Use one tablespoon of Benefiber daily, handout provided.  Eat yogurt or steel cut oats instead of cereal as we discussed.   I appreciate the opportunity to care for you. Silvano Rusk, MD, [facility]

## 2021-05-29 ENCOUNTER — Encounter (HOSPITAL_COMMUNITY): Payer: Self-pay

## 2021-07-02 NOTE — Telephone Encounter (Signed)
ENCOUNTER NOT NEEDED

## 2021-07-17 DIAGNOSIS — H524 Presbyopia: Secondary | ICD-10-CM | POA: Diagnosis not present

## 2021-07-17 DIAGNOSIS — H52223 Regular astigmatism, bilateral: Secondary | ICD-10-CM | POA: Diagnosis not present

## 2021-07-17 DIAGNOSIS — H43393 Other vitreous opacities, bilateral: Secondary | ICD-10-CM | POA: Diagnosis not present

## 2021-07-17 DIAGNOSIS — H26493 Other secondary cataract, bilateral: Secondary | ICD-10-CM | POA: Diagnosis not present

## 2021-07-23 NOTE — Progress Notes (Signed)
Elijah [name] D.[place] Phone: [phone]   Assessment and Plan:     1. DDD (degenerative disc disease), lumbar 2. Low back pain, unspecified back pain laterality, unspecified chronicity, unspecified whether sciatica present - Chronic with exacerbation, initial sports medicine visit - Severe DDD of lumbar spine, most significant L4-S1 - X-ray obtained in clinic.  My interpretation: No acute fracture or vertebral collapse.  Significant degenerative changes most significant in L4-S1 - Start Tylenol 600 mg 2 tablets 2 times a day and may use additional tablet as needed for breakthrough pain - Do not recommend regular NSAID use due to patient being on Xarelto - Offered gabapentin with recommendation of starting at 100 mg daily and slowly titrating up.  Patient wants to research this medication and talk to his PCP before starting - DG Lumbar Spine Complete; Future  3. Osteoarthritis of both hips, unspecified osteoarthritis type 4. Hip pain -Chronic with exacerbation, initial sports medicine visit - Severe bilateral osteoarthritis of hips - X-ray obtained in clinic.  My interpretation: Severe bilateral cortical changes of femoral heads and acetabulum with decreased space, cystic changes, cortical irregularities, representing severe osteoarthritis.  No acute fracture or dislocation - Start Tylenol 600 mg 2 tablets 2 times a day and may use additional tablet as needed for breakthrough pain - Offered gabapentin with recommendation of starting at 100 mg daily and slowly titrating up.  Patient wants to research this medication and talk to his PCP before starting - DG Pelvis 1-2 Views; Future  5. Right ankle pain, unspecified chronicity -Chronic exacerbation, initial visit - Generalized right ankle and shin pain.  Chronic neuropathy and mild to moderate OA of right ankle may be contributing - X-ray obtained in clinic.   My interpretation: No acute fracture or dislocation.  Cortical changes of ankle joint representing mild to moderate osteoarthritis of ankle as well as midfoot arthritic changes - Start Tylenol 600 mg 2 tablets 2 times a day and may use additional tablet as needed for breakthrough pain - Offered gabapentin with recommendation of starting at 100 mg daily and slowly titrating up.  Patient wants to research this medication and talk to his PCP before starting - DG Ankle Complete Right; Future    Pertinent previous records reviewed include none pertinent   Follow Up: 2 to 4 weeks for reevaluation.  If Tylenol was ineffective, we can discuss starting gabapentin with slow increasing titration with goal of finding lowest effective dose.  Could also discuss referral to neurosurgery and orthopedic surgery for hip replacements if patient wishes    Subjective:   I, Pincus Badder, am serving as a Education administrator for Doctor Glennon Mac  Chief Complaint: hip and back pain   HPI:   07/24/2021 Patient is an 80 year old male complaining of hip and back pain. Patient states that he has had hip and back pain for about 6/7 years no MOI , has neuropathy. 4/5 years ago had plywood fall on [place] at the [place] has some pain when that foot is in the flexed position   Relevant Historical Information: Hypertension, CKD stage III, history of CVA on blood thinner  Additional pertinent review of systems negative.   Current Outpatient Medications:    acetaminophen (TYLENOL) 650 MG CR tablet, Take 650 mg by mouth 2 (two) times daily., Disp: , Rfl:    cholecalciferol (VITAMIN D3) 25 MCG (1000 UT) tablet, Take 1,000 Units by mouth 2 (two) times daily.,  Disp: , Rfl:    famotidine (PEPCID) 20 MG tablet, TAKE 1 TABLET(20 MG) BY MOUTH TWICE DAILY, Disp: 180 tablet, Rfl: 3   metoprolol succinate (TOPROL-XL) 25 MG 24 hr tablet, TAKE 1 TABLET BY MOUTH DAILY WITH OR IMMEDIATELY FOLLOWING A MEAL, Disp: 90 tablet, Rfl: 3   Multiple  Vitamin (MULTIVITAMIN WITH MINERALS) TABS tablet, Take 1 tablet by mouth daily., Disp: , Rfl:    simvastatin (ZOCOR) 20 MG tablet, TAKE 1 TABLET BY MOUTH EVERY DAY, Disp: 90 tablet, Rfl: 1   XARELTO 20 MG TABS tablet, TAKE 1 TABLET(20 MG) BY MOUTH DAILY, Disp: 90 tablet, Rfl: 1   hydrocortisone (ANUSOL-HC) 25 MG suppository, Place 1 suppository (25 mg total) rectally at bedtime as needed for hemorrhoids or anal itching., Disp: 24 suppository, Rfl: 0   Objective:     Vitals:   07/24/21 1326  BP: 120/80  Pulse: (!) 42  SpO2: (!) 78%  Weight: 207 lb (93.9 kg)  Height: 6\' 3"  (1.905 m)      Body mass index is 25.87 kg/m.    Physical Exam:    Gen: Appears well, nad, nontoxic and pleasant Psych: Alert and oriented, appropriate mood and affect Neuro: sensation intact, strength is 5/5 in upper and lower extremities, muscle tone wnl Skin: no susupicious lesions or rashes  Back - Normal skin, Spine with normal alignment and no deformity.   No tenderness to vertebral process palpation.   Paraspinous muscles are not tender and without spasm  Bilateral hip: No deformity, swelling or wasting ROM Fexion 80, ext 10, IR 20, ER 20 NTTP over the hip flexors, greater troch, glute musculature, si joint, lumbar spine Positive log roll with FROM Positive   FABER Positive FADIR   Gait slowed, short steps  Electronically signed by:  Elijah [name] D.[facility] 2:51 PM 07/24/21

## 2021-07-24 ENCOUNTER — Ambulatory Visit (INDEPENDENT_AMBULATORY_CARE_PROVIDER_SITE_OTHER): Payer: Medicare Other | Admitting: Sports Medicine

## 2021-07-24 ENCOUNTER — Ambulatory Visit (INDEPENDENT_AMBULATORY_CARE_PROVIDER_SITE_OTHER): Payer: Medicare Other

## 2021-07-24 ENCOUNTER — Other Ambulatory Visit: Payer: Self-pay

## 2021-07-24 VITALS — BP 120/80 | HR 42 | Ht 75.0 in | Wt 207.0 lb

## 2021-07-24 DIAGNOSIS — R102 Pelvic and perineal pain: Secondary | ICD-10-CM | POA: Diagnosis not present

## 2021-07-24 DIAGNOSIS — M5136 Other intervertebral disc degeneration, lumbar region: Secondary | ICD-10-CM | POA: Diagnosis not present

## 2021-07-24 DIAGNOSIS — M25559 Pain in unspecified hip: Secondary | ICD-10-CM | POA: Diagnosis not present

## 2021-07-24 DIAGNOSIS — M160 Bilateral primary osteoarthritis of hip: Secondary | ICD-10-CM | POA: Diagnosis not present

## 2021-07-24 DIAGNOSIS — M25571 Pain in right ankle and joints of right foot: Secondary | ICD-10-CM

## 2021-07-24 DIAGNOSIS — M545 Low back pain, unspecified: Secondary | ICD-10-CM

## 2021-07-24 NOTE — Patient Instructions (Addendum)
Good to see you  Tylenol 650 mg 2 tablets two times a day can use an additional tablet as needed for pain  Discussed Gabapentin or Neurontin 2/4 week follow up

## 2021-08-02 ENCOUNTER — Other Ambulatory Visit: Payer: Self-pay | Admitting: Family Medicine

## 2021-08-06 NOTE — Progress Notes (Signed)
Elijah [name] D.[place] Phone: [phone]   Assessment and Plan:     1. DDD (degenerative disc disease), lumbar 2. Low back pain, unspecified back pain laterality, unspecified chronicity, unspecified whether sciatica present -Chronic with exacerbation, subsequent visit - Continued significant low back pain with right-sided radicular symptoms that are not significantly improved with conservative treatment including daily Tylenol and HEP - Patient wishes to discuss with neurosurgeon to see if there would be a beneficial surgery - Patient advised with list of local neurosurgeons that we could refer to.  Patient will contact us to let us know if there is a specific neurosurgeon he would like [place] to refer to.  3. Osteoarthritis of both hips, unspecified osteoarthritis type 4. Hip pain -Chronic with exacerbation, subsequent visit - Continued significant bilateral hip pain that is not significantly improved with conservative treatment including daily Tylenol and HEP - Patient wishes to discuss with orthopedic surgeon to see if there would be a beneficial surgery - Patient advised with list of local orthopedic surgeons that we could refer to.  Patient will contact us to let us know if there is a specific neurosurgeon he would like [place] to refer to.    Pertinent previous records reviewed include none    Time of visit 33 minutes, which included chart review, physical exam, treatment plan being performed, interpreted, and discussed with patient at today's visit.  Follow Up: Patient to contact us and let us know which neurosurgeon or which orthopedic surgeon he would like to be referred to   Subjective:   I, Elijah [name], am serving as a Education administrator for Doctor Glennon Mac  Chief Complaint: hip pain   HPI:   07/24/2021 Patient is an 81 year old male complaining of hip and back pain. Patient states that he has had  hip and back pain for about 6/7 years no MOI , has neuropathy. 4/5 years ago [place] plywood [place] has some pain when that foot is in the flexed position    08/07/2021  Patient states that he feels the same . Would like to talk about how he can fix it . After his last visit his back was killing him for a few days    Relevant Historical Information: Hypertension, CKD stage III, history of CVA on blood thinner  Additional pertinent review of systems negative.   Current Outpatient Medications:    acetaminophen (TYLENOL) 650 MG CR tablet, Take 650 mg by mouth 2 (two) times daily., Disp: , Rfl:    cholecalciferol (VITAMIN D3) 25 MCG (1000 UT) tablet, Take 1,000 Units by mouth 2 (two) times daily., Disp: , Rfl:    famotidine (PEPCID) 20 MG tablet, TAKE 1 TABLET(20 MG) BY MOUTH TWICE DAILY, Disp: 180 tablet, Rfl: 3   metoprolol succinate (TOPROL-XL) 25 MG 24 hr tablet, TAKE 1 TABLET BY MOUTH DAILY WITH OR IMMEDIATELY FOLLOWING A MEAL, Disp: 90 tablet, Rfl: 3   Multiple Vitamin (MULTIVITAMIN WITH MINERALS) TABS tablet, Take 1 tablet by mouth daily., Disp: , Rfl:    simvastatin (ZOCOR) 20 MG tablet, TAKE 1 TABLET BY MOUTH EVERY DAY, Disp: 90 tablet, Rfl: 1   XARELTO 20 MG TABS tablet, TAKE 1 TABLET(20 MG) BY MOUTH DAILY, Disp: 90 tablet, Rfl: 1   hydrocortisone (ANUSOL-HC) 25 MG suppository, Place 1 suppository (25 mg total) rectally at bedtime as needed for hemorrhoids or anal itching., Disp: 24 suppository, Rfl: 0  Objective:     Vitals:   08/07/21 1322  BP: 120/80  SpO2: 99%  Weight: 208 lb (94.3 kg)  Height: 6\' 3"  (1.905 m)      Body mass index is 26 kg/m.    Physical Exam:    Gen: Appears well, nad, nontoxic and pleasant Psych: Alert and oriented, appropriate mood and affect Neuro: sensation intact, strength is 5/5 in upper and lower extremities, muscle tone wnl Skin: no susupicious lesions or rashes   Back - Normal skin, Spine with normal alignment and no  deformity.   No tenderness to vertebral process palpation.   Paraspinous muscles are not tender and without spasm   Bilateral hip: No deformity, swelling or wasting ROM Fexion 80, ext 10, IR 20, ER 20 NTTP over the hip flexors, greater troch, glute musculature, si joint, lumbar spine Positive log roll with FROM Positive   FABER Positive FADIR   Gait slowed, short steps   Electronically signed by:  Elijah [name] D.[facility] 2:15 PM 08/07/21

## 2021-08-07 ENCOUNTER — Ambulatory Visit (INDEPENDENT_AMBULATORY_CARE_PROVIDER_SITE_OTHER): Payer: Medicare Other | Admitting: Sports Medicine

## 2021-08-07 ENCOUNTER — Other Ambulatory Visit: Payer: Self-pay

## 2021-08-07 VITALS — BP 120/80 | Ht 75.0 in | Wt 208.0 lb

## 2021-08-07 DIAGNOSIS — M25559 Pain in unspecified hip: Secondary | ICD-10-CM

## 2021-08-07 DIAGNOSIS — M545 Low back pain, unspecified: Secondary | ICD-10-CM

## 2021-08-07 DIAGNOSIS — M5136 Other intervertebral disc degeneration, lumbar region: Secondary | ICD-10-CM | POA: Diagnosis not present

## 2021-08-07 DIAGNOSIS — M160 Bilateral primary osteoarthritis of hip: Secondary | ICD-10-CM | POA: Diagnosis not present

## 2021-08-07 DIAGNOSIS — M51369 Other intervertebral disc degeneration, lumbar region without mention of lumbar back pain or lower extremity pain: Secondary | ICD-10-CM

## 2021-08-07 NOTE — Patient Instructions (Addendum)
Good to see you  Rodell Perna-- Rocco Pauls at [place] [facility] [place] [facility] [place] Contact us with who you would like a referral for

## 2021-08-08 ENCOUNTER — Encounter: Payer: Self-pay | Admitting: Family Medicine

## 2021-08-12 ENCOUNTER — Other Ambulatory Visit: Payer: Self-pay | Admitting: Sports Medicine

## 2021-08-12 ENCOUNTER — Encounter: Payer: Self-pay | Admitting: Sports Medicine

## 2021-08-12 DIAGNOSIS — M545 Low back pain, unspecified: Secondary | ICD-10-CM

## 2021-08-12 DIAGNOSIS — M5136 Other intervertebral disc degeneration, lumbar region: Secondary | ICD-10-CM

## 2021-08-12 NOTE — Telephone Encounter (Signed)
Referral to Dr Vertell Limber was sent this morning

## 2021-08-13 ENCOUNTER — Ambulatory Visit: Payer: Medicare Other | Admitting: Family Medicine

## 2021-08-14 ENCOUNTER — Ambulatory Visit: Payer: Medicare Other | Admitting: Sports Medicine

## 2021-08-22 DIAGNOSIS — M5416 Radiculopathy, lumbar region: Secondary | ICD-10-CM | POA: Diagnosis not present

## 2021-08-28 DIAGNOSIS — L570 Actinic keratosis: Secondary | ICD-10-CM | POA: Diagnosis not present

## 2021-08-28 DIAGNOSIS — Z85820 Personal history of malignant melanoma of skin: Secondary | ICD-10-CM | POA: Diagnosis not present

## 2021-08-28 DIAGNOSIS — M5416 Radiculopathy, lumbar region: Secondary | ICD-10-CM | POA: Diagnosis not present

## 2021-08-28 DIAGNOSIS — D0362 Melanoma in situ of left upper limb, including shoulder: Secondary | ICD-10-CM | POA: Diagnosis not present

## 2021-08-28 DIAGNOSIS — Z08 Encounter for follow-up examination after completed treatment for malignant neoplasm: Secondary | ICD-10-CM | POA: Diagnosis not present

## 2021-08-28 DIAGNOSIS — M48061 Spinal stenosis, lumbar region without neurogenic claudication: Secondary | ICD-10-CM | POA: Diagnosis not present

## 2021-08-28 DIAGNOSIS — Z1283 Encounter for screening for malignant neoplasm of skin: Secondary | ICD-10-CM | POA: Diagnosis not present

## 2021-08-28 DIAGNOSIS — D0359 Melanoma in situ of other part of trunk: Secondary | ICD-10-CM | POA: Diagnosis not present

## 2021-08-28 DIAGNOSIS — X32XXXD Exposure to sunlight, subsequent encounter: Secondary | ICD-10-CM | POA: Diagnosis not present

## 2021-08-28 DIAGNOSIS — L820 Inflamed seborrheic keratosis: Secondary | ICD-10-CM | POA: Diagnosis not present

## 2021-08-28 DIAGNOSIS — M545 Low back pain, unspecified: Secondary | ICD-10-CM | POA: Diagnosis not present

## 2021-08-29 DIAGNOSIS — Z6826 Body mass index (BMI) 26.0-26.9, adult: Secondary | ICD-10-CM | POA: Diagnosis not present

## 2021-08-29 DIAGNOSIS — I10 Essential (primary) hypertension: Secondary | ICD-10-CM | POA: Diagnosis not present

## 2021-08-29 DIAGNOSIS — M5416 Radiculopathy, lumbar region: Secondary | ICD-10-CM | POA: Diagnosis not present

## 2021-09-01 ENCOUNTER — Other Ambulatory Visit: Payer: Self-pay | Admitting: Family Medicine

## 2021-09-02 ENCOUNTER — Encounter: Payer: Self-pay | Admitting: Family Medicine

## 2021-09-04 DIAGNOSIS — Z8546 Personal history of malignant neoplasm of prostate: Secondary | ICD-10-CM | POA: Diagnosis not present

## 2021-09-05 ENCOUNTER — Other Ambulatory Visit: Payer: Self-pay | Admitting: Neurosurgery

## 2021-09-05 ENCOUNTER — Telehealth: Payer: Self-pay | Admitting: *Deleted

## 2021-09-05 NOTE — Telephone Encounter (Signed)
° °  Pre-operative Risk Assessment    Patient Name: Elijah [name]  DOB: [birth year] MRN: [id number]     Request for Surgical Clearance    Procedure:   L2-3, L3-4, L4-5 lumbar laminectomy  Date of Surgery:  Clearance 09/23/21                                 Surgeon:  Dr Kary Kos Surgeon's Group or Practice Name:  [facility] Phone number:  [phone] Fax number:  [phone] Lorriane Shire   Type of Clearance Requested:   - Pharmacy:  Hold Rivaroxaban (Xarelto) for 5 days   Type of Anesthesia:  General    Additional requests/questions:    Gillermina Phy   09/05/2021, 2:29 PM

## 2021-09-05 NOTE — Telephone Encounter (Signed)
Left a message for the patient to call back and speak to the on-call preop APP of the day 

## 2021-09-05 NOTE — Telephone Encounter (Signed)
Pharmacist to review Xarelto

## 2021-09-05 NOTE — Telephone Encounter (Signed)
Patient with diagnosis of afib on Xarelto for anticoagulation.    Procedure: L2-3, L3-4, L4-5 lumbar laminectomy Date of procedure: 09/23/21  CHA2DS2-VASc Score = 5  This indicates a 7.2% annual risk of stroke. 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  CrCl 26mL/min Platelet count 196K  Request is to hold Xarelto for 5 days prior which is not needed (pt not on warfarin). Typically 3 day DOAC hold is sufficient for spinal procedures. In setting of prior cardioembolic CVA (with what sounds like prior CVA as well), will defer to MD for input on anticoag length of hold.

## 2021-09-06 NOTE — Telephone Encounter (Signed)
Cardiac clearance request for holding Xarelto 5 days before laminectomy. 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  Pharmacy recommendations: Request is to hold Xarelto for 5 days prior which is not needed (pt not on warfarin). Typically 3 day DOAC hold is sufficient for spinal procedures. In setting of prior cardioembolic CVA (with what sounds like prior CVA as well), will defer to MD for input on anticoag length of hold.  Dr. Rayann Heman, please comment on holding of Xarelto.  Spoke to patient on the phone and he is doing well from a cardiac perspective since office visit in 04/2021.   Please send back to P CV DIV PREOP Pool.

## 2021-09-06 NOTE — Telephone Encounter (Signed)
Called patient back and LVM

## 2021-09-06 NOTE — Telephone Encounter (Signed)
I agree that we should only hold xarelto x 72 hours and resume as soon as able post operatively.  If additional holding of xarelto is felt to be required, I would advise lovenox bridge.

## 2021-09-06 NOTE — Telephone Encounter (Signed)
Pt is returning call.  

## 2021-09-06 NOTE — Telephone Encounter (Signed)
LVM to call back.

## 2021-09-09 NOTE — Telephone Encounter (Signed)
° ° °  Patient Name: Emilian Stawicki  DOB: 1941/03/23 MRN: [id number]  Primary Cardiologist: None  Chart reviewed as part of pre-operative protocol coverage. Patient has upcoming spinal procedure planned and we were asked to give our recommendations for holding Xarelto. Cadence Jorene Minors, contacted the patient on 09/06/2021 for additional pre-op evaluation at which time patient reported doing well from a cardiac standpoint since his last office visit. Therefore, no additional cardiovascular testing needed prior to surgery.  Per Pharmacy and Dr. Rayann Heman, recommend only holding Xarelto for 3 days (72 hours) and resuming as soon as possible postoperatively given history of cardioembolic CVA (with what sounds like a prior CVA as well). If additional holding of Xarelto is felt to be required, patient will need a Lovenox bridge. Please contact our office in this case so we can help arrange Lovenox bridge.  I will route this recommendation to the requesting party via Epic fax function and remove from pre-op pool.  Please call with questions.  Darreld Mclean, PA-C 09/09/2021, 9:49 AM

## 2021-09-10 DIAGNOSIS — L98499 Non-pressure chronic ulcer of skin of other sites with unspecified severity: Secondary | ICD-10-CM | POA: Diagnosis not present

## 2021-09-10 DIAGNOSIS — D0362 Melanoma in situ of left upper limb, including shoulder: Secondary | ICD-10-CM | POA: Diagnosis not present

## 2021-09-10 DIAGNOSIS — D0359 Melanoma in situ of other part of trunk: Secondary | ICD-10-CM | POA: Diagnosis not present

## 2021-09-11 DIAGNOSIS — Z8546 Personal history of malignant neoplasm of prostate: Secondary | ICD-10-CM | POA: Diagnosis not present

## 2021-09-18 NOTE — Progress Notes (Signed)
Surgical Instructions    Your procedure is scheduled on 09/23/21.  Report to [facility] Main Entrance "A" at 5:30 A.M., then check in with the Admitting office.  Call this number if you have problems the morning of surgery:  [phone]   If you have any questions prior to your surgery date call [phone]: Open Monday-Friday 8am-4pm    Remember:  Do not eat after midnight the night before your surgery  You may drink clear liquids until 4:30 the morning of your surgery.   Clear liquids allowed are: Water, Non-Citrus Juices (without pulp), Carbonated Beverages, Clear Tea, Black Coffee ONLY (NO MILK, CREAM OR POWDERED CREAMER of any kind), and Gatorade    Take these medicines the morning of surgery with A SIP OF WATER:  acetaminophen (TYLENOL)  famotidine (PEPCID) metoprolol succinate (TOPROL-XL) simvastatin (ZOCOR)  Stop XARELTO 3 days (72 hours) prior to surgery.    As of today, STOP taking any Aspirin (unless otherwise instructed by your surgeon) Aleve, Naproxen, Ibuprofen, Motrin, Advil, Goody's, BC's, all herbal medications, fish oil, and all vitamins.           Do not wear jewelry  Do not wear lotions, powders, colognes, or deodorant. Do not shave 48 hours prior to surgery.  Men may shave face and neck. Do not bring valuables to the hospital.   [facility] is not responsible for any belongings or valuables. .   Do NOT Smoke (Tobacco/Vaping)  24 hours prior to your procedure  If you use a CPAP at night, you may bring your mask for your overnight stay.   Contacts, glasses, hearing aids, dentures or partials may not be worn into surgery, please bring cases for these belongings   For patients admitted to the hospital, discharge time will be determined by your treatment team.   Patients discharged the day of surgery will not be allowed to drive home, and someone needs to stay with them for 24 hours.  NO VISITORS WILL BE ALLOWED IN PRE-OP WHERE PATIENTS ARE PREPPED FOR  SURGERY.  ONLY 1 SUPPORT PERSON MAY BE PRESENT IN THE WAITING ROOM WHILE YOU ARE IN SURGERY.  IF YOU ARE TO BE ADMITTED, ONCE YOU ARE IN YOUR ROOM YOU WILL BE ALLOWED TWO (2) VISITORS. 1 (ONE) VISITOR MAY STAY OVERNIGHT BUT MUST ARRIVE TO THE ROOM BY 8pm.  Minor children may have two parents present. Special consideration for safety and communication needs will be reviewed on a case by case basis.  Special instructions:    Oral Hygiene is also important to reduce your risk of infection.  Remember - BRUSH YOUR TEETH THE MORNING OF SURGERY WITH YOUR REGULAR TOOTHPASTE   [facility]- Preparing For Surgery  Before surgery, you can play an important role. Because skin is not sterile, your skin needs to be as free of germs as possible. You can reduce the number of germs on your skin by washing with CHG (chlorahexidine gluconate) Soap before surgery.  CHG is an antiseptic cleaner which kills germs and bonds with the skin to continue killing germs even after washing.     Please do not use if you have an allergy to CHG or antibacterial soaps. If your skin becomes reddened/irritated stop using the CHG.  Do not shave (including legs and underarms) for at least 48 hours prior to first CHG shower. It is OK to shave your face.  Please follow these instructions carefully.     Shower the NIGHT BEFORE SURGERY and the MORNING OF SURGERY  with CHG Soap.   If you chose to wash your hair, wash your hair first as usual with your normal shampoo. After you shampoo, rinse your hair and body thoroughly to remove the shampoo.  Then [employer] and genitals (private parts) with your normal soap and rinse thoroughly to remove soap.  After that Use CHG Soap as you would any other liquid soap. You can apply CHG directly to the skin and wash gently with a scrungie or a clean washcloth.   Apply the CHG Soap to your body ONLY FROM THE NECK DOWN.  Do not use on open wounds or open sores. Avoid contact with your eyes, ears, mouth  and genitals (private parts). Wash Face and genitals (private parts)  with your normal soap.   Wash thoroughly, paying special attention to the area where your surgery will be performed.  Thoroughly rinse your body with warm water from the neck down.  DO NOT shower/wash with your normal soap after using and rinsing off the CHG Soap.  Pat yourself dry with a CLEAN TOWEL.  Wear CLEAN PAJAMAS to bed the night before surgery  Place CLEAN SHEETS on your bed the night before your surgery  DO NOT SLEEP WITH PETS.   Day of Surgery:  Take a shower with CHG soap. Wear Clean/Comfortable clothing the morning of surgery Do not apply any deodorants/lotions.   Remember to brush your teeth WITH YOUR REGULAR TOOTHPASTE.    COVID testing  If you are going to stay overnight or be admitted after your procedure/surgery and require a pre-op COVID test, please follow these instructions after your COVID test   You are not required to quarantine however you are required to wear a well-fitting mask when you are out and around people not in your household.  If your mask becomes wet or soiled, replace with a new one.  Wash your hands often with soap and water for 20 seconds or clean your hands with an alcohol-based hand sanitizer that contains at least 60% alcohol.  Do not share personal items.  Notify your provider: if you are in close contact with someone who has COVID  or if you develop a fever of 100.4 or greater, sneezing, cough, sore throat, shortness of breath or body aches.    Please read over the following fact sheets that you were given.

## 2021-09-19 ENCOUNTER — Encounter (HOSPITAL_COMMUNITY)
Admission: RE | Admit: 2021-09-19 | Discharge: 2021-09-19 | Disposition: A | Discharge status: Home / Self Care | Payer: Medicare Other | Source: Ambulatory Visit | Attending: Neurosurgery | Admitting: Neurosurgery

## 2021-09-19 ENCOUNTER — Other Ambulatory Visit: Payer: Self-pay

## 2021-09-19 ENCOUNTER — Encounter (HOSPITAL_COMMUNITY): Payer: Self-pay

## 2021-09-19 VITALS — BP 145/84 | HR 90 | Temp 98.3°F | Resp 18 | Ht 75.0 in | Wt 207.8 lb

## 2021-09-19 DIAGNOSIS — M48061 Spinal stenosis, lumbar region without neurogenic claudication: Secondary | ICD-10-CM | POA: Diagnosis not present

## 2021-09-19 DIAGNOSIS — D440 Neoplasm of uncertain behavior of thyroid gland: Secondary | ICD-10-CM | POA: Diagnosis not present

## 2021-09-19 DIAGNOSIS — Z7901 Long term (current) use of anticoagulants: Secondary | ICD-10-CM | POA: Insufficient documentation

## 2021-09-19 DIAGNOSIS — Z8546 Personal history of malignant neoplasm of prostate: Secondary | ICD-10-CM | POA: Diagnosis not present

## 2021-09-19 DIAGNOSIS — N189 Chronic kidney disease, unspecified: Secondary | ICD-10-CM | POA: Insufficient documentation

## 2021-09-19 DIAGNOSIS — I129 Hypertensive chronic kidney disease with stage 1 through stage 4 chronic kidney disease, or unspecified chronic kidney disease: Secondary | ICD-10-CM | POA: Diagnosis not present

## 2021-09-19 DIAGNOSIS — E785 Hyperlipidemia, unspecified: Secondary | ICD-10-CM | POA: Insufficient documentation

## 2021-09-19 DIAGNOSIS — I6523 Occlusion and stenosis of bilateral carotid arteries: Secondary | ICD-10-CM | POA: Diagnosis not present

## 2021-09-19 DIAGNOSIS — G629 Polyneuropathy, unspecified: Secondary | ICD-10-CM | POA: Insufficient documentation

## 2021-09-19 DIAGNOSIS — Z8673 Personal history of transient ischemic attack (TIA), and cerebral infarction without residual deficits: Secondary | ICD-10-CM | POA: Diagnosis not present

## 2021-09-19 DIAGNOSIS — Z87891 Personal history of nicotine dependence: Secondary | ICD-10-CM | POA: Diagnosis not present

## 2021-09-19 DIAGNOSIS — R7303 Prediabetes: Secondary | ICD-10-CM | POA: Insufficient documentation

## 2021-09-19 DIAGNOSIS — N183 Chronic kidney disease, stage 3 unspecified: Secondary | ICD-10-CM

## 2021-09-19 DIAGNOSIS — Z9079 Acquired absence of other genital organ(s): Secondary | ICD-10-CM | POA: Diagnosis not present

## 2021-09-19 DIAGNOSIS — Z01812 Encounter for preprocedural laboratory examination: Secondary | ICD-10-CM | POA: Insufficient documentation

## 2021-09-19 DIAGNOSIS — N289 Disorder of kidney and ureter, unspecified: Secondary | ICD-10-CM

## 2021-09-19 DIAGNOSIS — Z01818 Encounter for other preprocedural examination: Secondary | ICD-10-CM

## 2021-09-19 DIAGNOSIS — I10 Essential (primary) hypertension: Secondary | ICD-10-CM

## 2021-09-19 DIAGNOSIS — Z20822 Contact with and (suspected) exposure to covid-19: Secondary | ICD-10-CM | POA: Insufficient documentation

## 2021-09-19 HISTORY — DX: Cardiac arrhythmia, unspecified: I49.9

## 2021-09-19 HISTORY — DX: Gastro-esophageal reflux disease without esophagitis: K21.9

## 2021-09-19 HISTORY — DX: Prediabetes: R73.03

## 2021-09-19 LAB — GLUCOSE, CAPILLARY: Glucose-Capillary: 86 mg/dL (ref 70–99)

## 2021-09-19 LAB — CBC
HCT: 45.9 % (ref 39.0–52.0)
Hemoglobin: 15 g/dL (ref 13.0–17.0)
MCH: 31.4 pg (ref 26.0–34.0)
MCHC: 32.7 g/dL (ref 30.0–36.0)
MCV: 96.2 fL (ref 80.0–100.0)
Platelets: 213 10*3/uL (ref 150–400)
RBC: 4.77 MIL/uL (ref 4.22–5.81)
RDW: 13 % (ref 11.5–15.5)
WBC: 7.7 10*3/uL (ref 4.0–10.5)
nRBC: 0 % (ref 0.0–0.2)

## 2021-09-19 LAB — BASIC METABOLIC PANEL
Anion gap: 8 (ref 5–15)
BUN: 23 mg/dL (ref 8–23)
CO2: 25 mmol/L (ref 22–32)
Calcium: 9.5 mg/dL (ref 8.9–10.3)
Chloride: 108 mmol/L (ref 98–111)
Creatinine, Ser: 1.3 mg/dL — ABNORMAL HIGH (ref 0.61–1.24)
GFR, Estimated: 56 mL/min — ABNORMAL LOW (ref >60)
Glucose, Bld: 90 mg/dL (ref 70–99)
Potassium: 4.4 mmol/L (ref 3.5–5.1)
Sodium: 141 mmol/L (ref 135–145)

## 2021-09-19 LAB — HEMOGLOBIN A1C
Hgb A1c MFr Bld: 5.8 % — ABNORMAL HIGH (ref 4.8–5.6)
Mean Plasma Glucose: 119.76 mg/dL

## 2021-09-19 LAB — TYPE AND SCREEN
ABO/RH(D): O POS
Antibody Screen: NEGATIVE

## 2021-09-19 LAB — SURGICAL PCR SCREEN
MRSA, PCR: NEGATIVE
Staphylococcus aureus: NEGATIVE

## 2021-09-19 LAB — SARS CORONAVIRUS 2 (TAT 6-24 HRS): SARS Coronavirus 2: NEGATIVE

## 2021-09-19 NOTE — Progress Notes (Addendum)
PCP - Francis Dowse Cardiologist -   PPM/ICD - Denies Device Orders -  Rep Notified -   Chest x-ray - 10/19/18 EKG - 04/10/21 Stress Test - no ECHO - 05/15/16 Cardiac Cath - no  Sleep Study - no CPAP -   Fasting Blood Sugar - pt does not check blood sugar at home Checks Blood Sugar _____ times a day  Blood Thinner Instructions:Hold Xarelto 72 hours prior to procedure per MD.Pt is aware. He states that today 09/19/21 will be his last dose.  Aspirin Instructions:n/a  ERAS Protcol - clear liquids until 0430. PRE-SURGERY Ensure or G2- no  COVID TEST- 09/19/21   Anesthesia review: yes;cardiac history,Xarelto  Patient denies shortness of breath, fever, cough and chest pain at PAT appointment   All instructions explained to the patient, with a verbal understanding of the material. Patient agrees to go over the instructions while at home for a better understanding. Patient also instructed to wear a mask after being tested for COVID-19. The opportunity to ask questions was provided.

## 2021-09-19 NOTE — Progress Notes (Signed)
Spoke with Lorriane Shire in Dr. Windy Carina office and informed her that [name] needs to sign this patient's pre op orders which are in second sign.  Lorriane Shire said that she will pass that info onto [name].

## 2021-09-20 NOTE — Anesthesia Preprocedure Evaluation (Addendum)
Anesthesia Evaluation  Patient identified by MRN, date of birth, ID band Patient awake    Reviewed: Allergy & Precautions, NPO status , Patient's Chart, lab work & pertinent test results  History of Anesthesia Complications Negative for: history of anesthetic complications  Airway Mallampati: II  TM Distance: >3 FB Neck ROM: Full    Dental no notable dental hx. (+) Dental Advisory Given   Pulmonary neg pulmonary ROS, former smoker,    Pulmonary exam normal        Cardiovascular hypertension, Pt. on medications and Pt. on home beta blockers Normal cardiovascular exam+ dysrhythmias Atrial Fibrillation      Neuro/Psych CVA    GI/Hepatic Neg liver ROS, GERD  ,  Endo/Other  negative endocrine ROS  Renal/GU negative Renal ROS     Musculoskeletal negative musculoskeletal ROS (+)   Abdominal   Peds  Hematology negative hematology ROS (+)   Anesthesia Other Findings   Reproductive/Obstetrics                           Anesthesia Physical Anesthesia Plan  ASA: 3  Anesthesia Plan: General   Post-op Pain Management: Tylenol PO (pre-op)*   Induction: Intravenous  PONV Risk Score and Plan: 3 and Ondansetron and Dexamethasone  Airway Management Planned: Oral ETT  Additional Equipment:   Intra-op Plan:   Post-operative Plan: Extubation in OR  Informed Consent: I have reviewed the patients History and Physical, chart, labs and discussed the procedure including the risks, benefits and alternatives for the proposed anesthesia with the patient or authorized representative who has indicated his/her understanding and acceptance.     Dental advisory given  Plan Discussed with: Anesthesiologist and CRNA  Anesthesia Plan Comments: (PAT note written 09/20/2021 by Myra Gianotti, PA-C. )      Anesthesia Quick Evaluation

## 2021-09-20 NOTE — Progress Notes (Signed)
Anesthesia Chart Review:  Case: [id number] Date/Time: 09/23/21 0715   Procedure: Laminectomy and Foraminotomy - L2-L3 - L3-L4 - L4-L5 (Back)   Anesthesia type: General   Pre-op diagnosis: Stenosis   Location: [facility] OR ROOM 20 / [facility]   Surgeons: Kary Kos, MD       DISCUSSION: Patient is an 81 year old male scheduled for the above procedure.  History includes former smoker (quit 1970), HTN, afib, prostate cancer (s/p robotic assisted laparoscopic radical prostatectomy 09/15/11), HLD, peripheral neuropathy, CVA (occluded right M2 branch supplying right parietal lobe 05/14/16 CTA head), CKD (stage III), pre-diabetes, thyroid nodule (right FNA 04/25/21: atypia of undermined significance), right IHR (06/16/19).   Preoperative cardiology input outline on 09/09/21 by Sande Rives, PA-C, "Patient has upcoming spinal procedure planned and we were asked to give our recommendations for holding Xarelto. Cadence Jorene Minors, contacted the patient on 09/06/2021 for additional pre-op evaluation at which time patient reported doing well from a cardiac standpoint since his last office visit. Therefore, no additional cardiovascular testing needed prior to surgery.   Per Pharmacy and Dr. Rayann Heman, recommend only holding Xarelto for 3 days (72 hours) and resuming as soon as possible postoperatively given history of cardioembolic CVA (with what sounds like a prior CVA as well). If additional holding of Xarelto is felt to be required, patient will need a Lovenox bridge. Please contact our office in this case so we can help arrange Lovenox bridge." Patient is aware of Xarelto instructions, reported last dose 09/19/21.   09/19/2021 presurgical COVID-19 test negative.  Anesthesia team to evaluate on the day of surgery.   VS: BP (!) 145/84    Pulse 90    Temp 36.8 C (Oral)    Resp 18    Ht 6\' 3"  (1.905 m)    Wt 94.3 kg    SpO2 99%    BMI 25.97 kg/m    PROVIDERS: [name], MD is PCP Thompson Grayer, MD is EP  cardiologist. Last visit 04/10/21 with Jetta Lout, PA-C. Six month recommended. Dutch Gray, MD is urologist Elayne Snare, MD is endocrinologist. Last visit 01/28/21. Right thyroid nodule FNA recommended.   Silvano Rusk, MD is GI   LABS: Labs reviewed: Acceptable for surgery. (all labs ordered are listed, but only abnormal results are displayed)  Labs Reviewed  HEMOGLOBIN A1C - Abnormal; Notable for the following components:      Result Value   Hgb A1c MFr Bld 5.8 (*)    All other components within normal limits  BASIC METABOLIC PANEL - Abnormal; Notable for the following components:   Creatinine, Ser 1.30 (*)    GFR, Estimated 56 (*)    All other components within normal limits  SURGICAL PCR SCREEN  SARS CORONAVIRUS 2 (TAT 6-24 HRS)  GLUCOSE, CAPILLARY  CBC  TYPE AND SCREEN     IMAGES: MRI L-spine 08/28/21 ([facility]/PACS): IMPRESSION: 1. 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 2. Moderate neural foraminal stenosis at L4-5.  US Thyroid 02/15/21: IMPRESSION: 1. Nodule 1 (TI-RADS 3) demonstrates interval threshold growth. Further evaluation with FNA is recommended. 2. Previously seen isthmus nodule not as well delineated on today's exam. 3. Dominant left thyroid nodule was previously biopsied in 2018. It is not significantly changed in size since prior exam. Please correlate with biopsy results. - FNA right superior nodule 04/25/21: Atypia of undetermined significance (Bethesda category III)   EKG: 04/10/21: Afib at 60 bpm. RBBB.  CV: Echocardiogram 05/15/16: Study Conclusions - Left ventricle: The cavity size was normal. Wall thickness was   increased in a pattern of mild LVH. Systolic function was normal.   The estimated ejection fraction was in the range of 55% to 60%.   Wall motion was normal; there were no regional wall motion   abnormalities. - Mitral valve:  Calcified annulus. There was mild regurgitation. - Left atrium: The atrium was severely dilated. - Right ventricle: The cavity size was mildly dilated. - Right atrium: The atrium was severely dilated. Impressions: - Normal LV systolic function; mild LVH; severe biatrial   enlargement; mild RVE; mild MR; trace TR.     Carotid US 12/22/14: Impression: Heterogeneous plaque, bilaterally.  1-39% bilateral ICA stenosis.  Normal subclavian arteries, bilaterally.  Patent vertebral arteries with antegrade flow.       Past Medical History:  Diagnosis Date   Adenomatous colon polyp    Arthritis 09/10/2011   rt. hip, knee, hands   Cancer ([place]) 09/10/2011   dx. Prostate cancer-bx. 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   Peripheral neuropathy    Permanent atrial fibrillation ([place]) 09/10/2011   Pre-diabetes    Prostate cancer ([place])    Stroke [facility])     Past Surgical History:  Procedure Laterality Date   COLONOSCOPY  04/2003, 06/05/2011   diverticulosis, internal and external hemorrhoids 2004 and 2012, 2 small polyps 2012   EYE SURGERY  09-10-11   Only stitches to close cut, and observation of hematoma from injury, cataracts bilateral   INGUINAL HERNIA REPAIR Right 06/16/2019   Procedure: OPEN RIGHT INGUINAL HERNIA REPAIR;  Surgeon: Alphonsa Overall, MD;  Location: [facility];  Service: General;  Laterality: Right;  OPEN RIGHT INGUINAL HERNIA REPAIR   ROBOT ASSISTED LAPAROSCOPIC RADICAL PROSTATECTOMY  09/15/2011   Procedure: ROBOTIC ASSISTED LAPAROSCOPIC RADICAL PROSTATECTOMY LEVEL 2;  Surgeon: Dutch Gray, MD;  Location: [facility];  Service: Urology;  Laterality: N/A;         TONSILLECTOMY      MEDICATIONS:   acetaminophen (TYLENOL) 650 MG CR tablet   cholecalciferol (VITAMIN D3) 25 MCG (1000 UT) tablet   famotidine (PEPCID) 20 MG tablet   metoprolol succinate (TOPROL-XL) 25 MG 24 hr tablet   Multiple Vitamin (MULTIVITAMIN WITH MINERALS) TABS tablet   simvastatin (ZOCOR) 20 MG tablet   XARELTO 20 MG TABS tablet   No current facility-administered medications for this encounter.    Myra Gianotti, PA-C Surgical Short Stay/Anesthesiology [facility] Phone [phone] [facility] Phone [phone] 09/20/2021 11:33 AM

## 2021-09-23 ENCOUNTER — Ambulatory Visit (HOSPITAL_COMMUNITY)
Admission: RE | Disposition: A | Discharge status: Home / Self Care | Payer: Self-pay | Source: Home / Self Care | Attending: Neurosurgery

## 2021-09-23 ENCOUNTER — Encounter (HOSPITAL_COMMUNITY): Payer: Self-pay | Admitting: Neurosurgery

## 2021-09-23 ENCOUNTER — Inpatient Hospital Stay (HOSPITAL_COMMUNITY): Payer: Medicare Other | Admitting: Emergency Medicine

## 2021-09-23 ENCOUNTER — Other Ambulatory Visit: Payer: Self-pay

## 2021-09-23 ENCOUNTER — Observation Stay (HOSPITAL_COMMUNITY)
Admission: RE | Admit: 2021-09-23 | Discharge: 2021-09-24 | Disposition: A | Discharge status: Home / Self Care | Payer: Medicare Other | Attending: Neurosurgery | Admitting: Neurosurgery

## 2021-09-23 ENCOUNTER — Inpatient Hospital Stay (HOSPITAL_COMMUNITY): Payer: Medicare Other

## 2021-09-23 ENCOUNTER — Inpatient Hospital Stay (HOSPITAL_COMMUNITY): Payer: Medicare Other | Admitting: Anesthesiology

## 2021-09-23 DIAGNOSIS — K219 Gastro-esophageal reflux disease without esophagitis: Secondary | ICD-10-CM | POA: Diagnosis not present

## 2021-09-23 DIAGNOSIS — I4891 Unspecified atrial fibrillation: Secondary | ICD-10-CM | POA: Diagnosis not present

## 2021-09-23 DIAGNOSIS — Z87891 Personal history of nicotine dependence: Secondary | ICD-10-CM | POA: Insufficient documentation

## 2021-09-23 DIAGNOSIS — I129 Hypertensive chronic kidney disease with stage 1 through stage 4 chronic kidney disease, or unspecified chronic kidney disease: Secondary | ICD-10-CM | POA: Insufficient documentation

## 2021-09-23 DIAGNOSIS — N183 Chronic kidney disease, stage 3 unspecified: Secondary | ICD-10-CM | POA: Diagnosis not present

## 2021-09-23 DIAGNOSIS — Z8546 Personal history of malignant neoplasm of prostate: Secondary | ICD-10-CM | POA: Insufficient documentation

## 2021-09-23 DIAGNOSIS — Z419 Encounter for procedure for purposes other than remedying health state, unspecified: Secondary | ICD-10-CM

## 2021-09-23 DIAGNOSIS — Z7901 Long term (current) use of anticoagulants: Secondary | ICD-10-CM | POA: Insufficient documentation

## 2021-09-23 DIAGNOSIS — Z9079 Acquired absence of other genital organ(s): Secondary | ICD-10-CM | POA: Insufficient documentation

## 2021-09-23 DIAGNOSIS — M5416 Radiculopathy, lumbar region: Secondary | ICD-10-CM

## 2021-09-23 DIAGNOSIS — M48062 Spinal stenosis, lumbar region with neurogenic claudication: Principal | ICD-10-CM | POA: Insufficient documentation

## 2021-09-23 DIAGNOSIS — M48061 Spinal stenosis, lumbar region without neurogenic claudication: Secondary | ICD-10-CM | POA: Diagnosis present

## 2021-09-23 DIAGNOSIS — Z79899 Other long term (current) drug therapy: Secondary | ICD-10-CM | POA: Insufficient documentation

## 2021-09-23 DIAGNOSIS — I10 Essential (primary) hypertension: Secondary | ICD-10-CM

## 2021-09-23 DIAGNOSIS — Z9889 Other specified postprocedural states: Secondary | ICD-10-CM | POA: Diagnosis not present

## 2021-09-23 HISTORY — PX: LUMBAR LAMINECTOMY/DECOMPRESSION MICRODISCECTOMY: SHX5026

## 2021-09-23 SURGERY — LUMBAR LAMINECTOMY/DECOMPRESSION MICRODISCECTOMY 3 LEVELS
Anesthesia: General | Site: Back

## 2021-09-23 MED ORDER — DEXAMETHASONE SODIUM PHOSPHATE 10 MG/ML IJ SOLN
INTRAMUSCULAR | Status: AC
  Filled 2021-09-23: qty 1

## 2021-09-23 MED ORDER — HYDROMORPHONE HCL 1 MG/ML IJ SOLN: 0.5000 mg | INTRAMUSCULAR | Status: DC | PRN

## 2021-09-23 MED ORDER — BUPIVACAINE HCL (PF) 0.25 % IJ SOLN
INTRAMUSCULAR | Status: DC | PRN
  Administered 2021-09-23: 10 mL

## 2021-09-23 MED ORDER — MENTHOL 3 MG MT LOZG: 1.0000 | LOZENGE | OROMUCOSAL | Status: DC | PRN

## 2021-09-23 MED ORDER — VITAMIN D 25 MCG (1000 UNIT) PO TABS
1000.0000 [IU] | ORAL_TABLET | Freq: Two times a day (BID) | ORAL | Status: DC
  Administered 2021-09-23 (×2): 1000 [IU] via ORAL
  Filled 2021-09-23 (×2): qty 1

## 2021-09-23 MED ORDER — ADULT MULTIVITAMIN W/MINERALS CH
1.0000 | ORAL_TABLET | Freq: Every day | ORAL | Status: DC
  Administered 2021-09-23: 1 via ORAL
  Filled 2021-09-23: qty 1

## 2021-09-23 MED ORDER — SODIUM CHLORIDE 0.9% FLUSH: 3.0000 mL | INTRAVENOUS | Status: DC | PRN

## 2021-09-23 MED ORDER — METOPROLOL SUCCINATE ER 25 MG PO TB24: 25.0000 mg | ORAL_TABLET | Freq: Every day | ORAL | Status: DC

## 2021-09-23 MED ORDER — PANTOPRAZOLE SODIUM 40 MG IV SOLR
40.0000 mg | Freq: Every day | INTRAVENOUS | Status: DC
  Administered 2021-09-23: 40 mg via INTRAVENOUS
  Filled 2021-09-23: qty 10

## 2021-09-23 MED ORDER — FENTANYL CITRATE (PF) 250 MCG/5ML IJ SOLN
INTRAMUSCULAR | Status: AC
  Filled 2021-09-23: qty 5

## 2021-09-23 MED ORDER — PROPOFOL 10 MG/ML IV BOLUS
INTRAVENOUS | Status: AC
  Filled 2021-09-23: qty 20

## 2021-09-23 MED ORDER — FAMOTIDINE 20 MG PO TABS
20.0000 mg | ORAL_TABLET | Freq: Two times a day (BID) | ORAL | Status: DC
  Administered 2021-09-23: 20 mg via ORAL
  Filled 2021-09-23: qty 1

## 2021-09-23 MED ORDER — ORAL CARE MOUTH RINSE: 15.0000 mL | Freq: Once | OROMUCOSAL | Status: AC

## 2021-09-23 MED ORDER — AMISULPRIDE (ANTIEMETIC) 5 MG/2ML IV SOLN
INTRAVENOUS | Status: AC
  Filled 2021-09-23: qty 4

## 2021-09-23 MED ORDER — CYCLOBENZAPRINE HCL 10 MG PO TABS
10.0000 mg | ORAL_TABLET | Freq: Three times a day (TID) | ORAL | Status: DC | PRN
  Administered 2021-09-23: 10 mg via ORAL
  Filled 2021-09-23: qty 1

## 2021-09-23 MED ORDER — SIMVASTATIN 20 MG PO TABS: 20.0000 mg | ORAL_TABLET | Freq: Every day | ORAL | Status: DC

## 2021-09-23 MED ORDER — LIDOCAINE-EPINEPHRINE 1 %-1:100000 IJ SOLN
INTRAMUSCULAR | Status: DC | PRN
  Administered 2021-09-23: 10 mL

## 2021-09-23 MED ORDER — ROCURONIUM BROMIDE 10 MG/ML (PF) SYRINGE
PREFILLED_SYRINGE | INTRAVENOUS | Status: AC
  Filled 2021-09-23: qty 10

## 2021-09-23 MED ORDER — LIDOCAINE-EPINEPHRINE 1 %-1:100000 IJ SOLN
INTRAMUSCULAR | Status: AC
  Filled 2021-09-23: qty 1

## 2021-09-23 MED ORDER — BUPIVACAINE HCL (PF) 0.25 % IJ SOLN
INTRAMUSCULAR | Status: AC
  Filled 2021-09-23: qty 30

## 2021-09-23 MED ORDER — PROPOFOL 10 MG/ML IV BOLUS
INTRAVENOUS | Status: DC | PRN
  Administered 2021-09-23: 150 mg via INTRAVENOUS

## 2021-09-23 MED ORDER — FENTANYL CITRATE (PF) 250 MCG/5ML IJ SOLN
INTRAMUSCULAR | Status: DC | PRN
  Administered 2021-09-23: 100 ug via INTRAVENOUS
  Administered 2021-09-23 (×2): 50 ug via INTRAVENOUS

## 2021-09-23 MED ORDER — HYDROCODONE-ACETAMINOPHEN 5-325 MG PO TABS
2.0000 | ORAL_TABLET | ORAL | Status: DC | PRN
  Administered 2021-09-23: 2 via ORAL
  Filled 2021-09-23: qty 2

## 2021-09-23 MED ORDER — PHENYLEPHRINE HCL-NACL 20-0.9 MG/250ML-% IV SOLN
INTRAVENOUS | Status: DC | PRN
  Administered 2021-09-23: 25 ug/min via INTRAVENOUS

## 2021-09-23 MED ORDER — ONDANSETRON HCL 4 MG PO TABS: 4.0000 mg | ORAL_TABLET | Freq: Four times a day (QID) | ORAL | Status: DC | PRN

## 2021-09-23 MED ORDER — ALUM & MAG HYDROXIDE-SIMETH 200-200-20 MG/5ML PO SUSP: 30.0000 mL | Freq: Four times a day (QID) | ORAL | Status: DC | PRN

## 2021-09-23 MED ORDER — CEFAZOLIN SODIUM-DEXTROSE 2-4 GM/100ML-% IV SOLN
2.0000 g | INTRAVENOUS | Status: AC
  Administered 2021-09-23: 2 g via INTRAVENOUS
  Filled 2021-09-23: qty 100

## 2021-09-23 MED ORDER — ACETAMINOPHEN 325 MG PO TABS
650.0000 mg | ORAL_TABLET | ORAL | Status: DC | PRN
Start: 2021-09-23 — End: 2021-09-24
  Administered 2021-09-24: 650 mg via ORAL
  Filled 2021-09-23: qty 2

## 2021-09-23 MED ORDER — ACETAMINOPHEN 650 MG RE SUPP
650.0000 mg | RECTAL | Status: DC | PRN
Start: 2021-09-23 — End: 2021-09-24

## 2021-09-23 MED ORDER — THROMBIN 5000 UNITS EX SOLR
CUTANEOUS | Status: AC
  Filled 2021-09-23: qty 10000

## 2021-09-23 MED ORDER — ROCURONIUM BROMIDE 10 MG/ML (PF) SYRINGE
PREFILLED_SYRINGE | INTRAVENOUS | Status: DC | PRN
Start: 2021-09-23 — End: 2021-09-23
  Administered 2021-09-23: 20 mg via INTRAVENOUS
  Administered 2021-09-23: 100 mg via INTRAVENOUS
  Administered 2021-09-23: 10 mg via INTRAVENOUS

## 2021-09-23 MED ORDER — 0.9 % SODIUM CHLORIDE (POUR BTL) OPTIME
TOPICAL | Status: DC | PRN
  Administered 2021-09-23: 1000 mL

## 2021-09-23 MED ORDER — LIDOCAINE 2% (20 MG/ML) 5 ML SYRINGE
INTRAMUSCULAR | Status: AC
  Filled 2021-09-23: qty 5

## 2021-09-23 MED ORDER — ONDANSETRON HCL 4 MG/2ML IJ SOLN: 4.0000 mg | Freq: Four times a day (QID) | INTRAMUSCULAR | Status: DC | PRN

## 2021-09-23 MED ORDER — SODIUM CHLORIDE 0.9 % IV SOLN
250.0000 mL | INTRAVENOUS | Status: DC
  Administered 2021-09-23: 250 mL via INTRAVENOUS

## 2021-09-23 MED ORDER — ONDANSETRON HCL 4 MG/2ML IJ SOLN
INTRAMUSCULAR | Status: AC
  Filled 2021-09-23: qty 2

## 2021-09-23 MED ORDER — PHENOL 1.4 % MT LIQD: 1.0000 | OROMUCOSAL | Status: DC | PRN

## 2021-09-23 MED ORDER — AMISULPRIDE (ANTIEMETIC) 5 MG/2ML IV SOLN
10.0000 mg | Freq: Once | INTRAVENOUS | Status: AC | PRN
  Administered 2021-09-23: 10 mg via INTRAVENOUS

## 2021-09-23 MED ORDER — CHLORHEXIDINE GLUCONATE CLOTH 2 % EX PADS
6.0000 | MEDICATED_PAD | Freq: Once | CUTANEOUS | Status: DC
Start: 2021-09-23 — End: 2021-09-23

## 2021-09-23 MED ORDER — CHLORHEXIDINE GLUCONATE CLOTH 2 % EX PADS: 6.0000 | MEDICATED_PAD | Freq: Once | CUTANEOUS | Status: DC

## 2021-09-23 MED ORDER — ACETAMINOPHEN 325 MG PO TABS
650.0000 mg | ORAL_TABLET | Freq: Two times a day (BID) | ORAL | Status: DC
  Administered 2021-09-23: 650 mg via ORAL
  Filled 2021-09-23: qty 2

## 2021-09-23 MED ORDER — FENTANYL CITRATE (PF) 100 MCG/2ML IJ SOLN: 25.0000 ug | INTRAMUSCULAR | Status: DC | PRN

## 2021-09-23 MED ORDER — DEXAMETHASONE SODIUM PHOSPHATE 10 MG/ML IJ SOLN
INTRAMUSCULAR | Status: DC | PRN
  Administered 2021-09-23: 10 mg via INTRAVENOUS

## 2021-09-23 MED ORDER — LACTATED RINGERS IV SOLN: INTRAVENOUS | Status: DC

## 2021-09-23 MED ORDER — SODIUM CHLORIDE 0.9% FLUSH
3.0000 mL | Freq: Two times a day (BID) | INTRAVENOUS | Status: DC
  Administered 2021-09-23 (×2): 3 mL via INTRAVENOUS

## 2021-09-23 MED ORDER — THROMBIN 20000 UNITS EX SOLR
CUTANEOUS | Status: DC | PRN
  Administered 2021-09-23: 20 mL via TOPICAL

## 2021-09-23 MED ORDER — CHLORHEXIDINE GLUCONATE 0.12 % MT SOLN
15.0000 mL | Freq: Once | OROMUCOSAL | Status: AC
  Administered 2021-09-23: 15 mL via OROMUCOSAL
  Filled 2021-09-23: qty 15

## 2021-09-23 MED ORDER — PROMETHAZINE HCL 25 MG/ML IJ SOLN: 6.2500 mg | INTRAMUSCULAR | Status: DC | PRN

## 2021-09-23 MED ORDER — ACETAMINOPHEN 500 MG PO TABS
1000.0000 mg | ORAL_TABLET | Freq: Once | ORAL | Status: AC
  Administered 2021-09-23: 500 mg via ORAL
  Filled 2021-09-23: qty 2

## 2021-09-23 MED ORDER — ONDANSETRON HCL 4 MG/2ML IJ SOLN
INTRAMUSCULAR | Status: DC | PRN
  Administered 2021-09-23: 4 mg via INTRAVENOUS

## 2021-09-23 MED ORDER — CEFAZOLIN SODIUM-DEXTROSE 2-4 GM/100ML-% IV SOLN
2.0000 g | Freq: Three times a day (TID) | INTRAVENOUS | Status: AC
  Administered 2021-09-23 (×2): 2 g via INTRAVENOUS
  Filled 2021-09-23 (×2): qty 100

## 2021-09-23 MED ORDER — LIDOCAINE 2% (20 MG/ML) 5 ML SYRINGE
INTRAMUSCULAR | Status: DC | PRN
  Administered 2021-09-23: 100 mg via INTRAVENOUS

## 2021-09-23 SURGICAL SUPPLY — 62 items
ADH SKN CLS APL DERMABOND .7 (GAUZE/BANDAGES/DRESSINGS) ×1
ADH SKN CLS LQ APL DERMABOND (GAUZE/BANDAGES/DRESSINGS) ×1
APL SKNCLS STERI-STRIP NONHPOA (GAUZE/BANDAGES/DRESSINGS) ×1
BAG COUNTER SPONGE SURGICOUNT (BAG) ×4 IMPLANT
BAG SPNG CNTER NS LX DISP (BAG) ×2
BAND INSRT 18 STRL LF DISP RB (MISCELLANEOUS)
BAND RUBBER #18 3X1/16 STRL (MISCELLANEOUS) ×4 IMPLANT
BENZOIN TINCTURE PRP APPL 2/3 (GAUZE/BANDAGES/DRESSINGS) ×3 IMPLANT
BLADE CLIPPER SURG (BLADE) IMPLANT
BLADE SURG 11 STRL SS (BLADE) ×3 IMPLANT
BUR CUTTER 7.0 ROUND (BURR) ×3 IMPLANT
BUR MATCHSTICK NEURO 3.0 LAGG (BURR) ×3 IMPLANT
CANISTER SUCT 3000ML PPV (MISCELLANEOUS) ×3 IMPLANT
CARTRIDGE OIL MAESTRO DRILL (MISCELLANEOUS) ×2 IMPLANT
DECANTER SPIKE VIAL GLASS SM (MISCELLANEOUS) ×2 IMPLANT
DERMABOND ADHESIVE PROPEN (GAUZE/BANDAGES/DRESSINGS) ×1
DERMABOND ADVANCED (GAUZE/BANDAGES/DRESSINGS) ×1
DERMABOND ADVANCED .7 DNX12 (GAUZE/BANDAGES/DRESSINGS) ×2 IMPLANT
DERMABOND ADVANCED .7 DNX6 (GAUZE/BANDAGES/DRESSINGS) IMPLANT
DIFFUSER DRILL AIR PNEUMATIC (MISCELLANEOUS) ×3 IMPLANT
DRAPE HALF SHEET 40X57 (DRAPES) IMPLANT
DRAPE LAPAROTOMY 100X72X124 (DRAPES) ×3 IMPLANT
DRAPE MICROSCOPE LEICA (MISCELLANEOUS) ×2 IMPLANT
DRAPE SURG 17X23 STRL (DRAPES) ×3 IMPLANT
DRSG OPSITE 4X5.5 SM (GAUZE/BANDAGES/DRESSINGS) ×1 IMPLANT
DRSG OPSITE POSTOP 4X8 (GAUZE/BANDAGES/DRESSINGS) ×1 IMPLANT
DURAPREP 26ML APPLICATOR (WOUND CARE) ×3 IMPLANT
ELECT REM PT RETURN 9FT ADLT (ELECTROSURGICAL) ×2
ELECTRODE REM PT RTRN 9FT ADLT (ELECTROSURGICAL) ×2 IMPLANT
EVACUATOR 1/8 PVC DRAIN (DRAIN) ×1 IMPLANT
GAUZE 4X4 16PLY ~~LOC~~+RFID DBL (SPONGE) ×2 IMPLANT
GAUZE SPONGE 4X4 12PLY STRL (GAUZE/BANDAGES/DRESSINGS) ×3 IMPLANT
GLOVE EXAM NITRILE XL STR (GLOVE) IMPLANT
GLOVE SURG ENC MOIS LTX SZ7 (GLOVE) IMPLANT
GLOVE SURG ENC MOIS LTX SZ8 (GLOVE) ×3 IMPLANT
GLOVE SURG LTX SZ7.5 (GLOVE) IMPLANT
GLOVE SURG UNDER POLY LF SZ7 (GLOVE) IMPLANT
GLOVE SURG UNDER POLY LF SZ8.5 (GLOVE) ×3 IMPLANT
GOWN STRL REUS W/ TWL LRG LVL3 (GOWN DISPOSABLE) ×2 IMPLANT
GOWN STRL REUS W/ TWL XL LVL3 (GOWN DISPOSABLE) ×4 IMPLANT
GOWN STRL REUS W/TWL 2XL LVL3 (GOWN DISPOSABLE) IMPLANT
GOWN STRL REUS W/TWL LRG LVL3 (GOWN DISPOSABLE) ×2
GOWN STRL REUS W/TWL XL LVL3 (GOWN DISPOSABLE) ×4
KIT BASIN OR (CUSTOM PROCEDURE TRAY) ×3 IMPLANT
KIT TURNOVER KIT B (KITS) ×3 IMPLANT
NDL SPNL 22GX3.5 QUINCKE BK (NEEDLE) ×2 IMPLANT
NEEDLE HYPO 22GX1.5 SAFETY (NEEDLE) ×3 IMPLANT
NEEDLE SPNL 22GX3.5 QUINCKE BK (NEEDLE) ×2 IMPLANT
NS IRRIG 1000ML POUR BTL (IV SOLUTION) ×3 IMPLANT
OIL CARTRIDGE MAESTRO DRILL (MISCELLANEOUS) ×2
PACK LAMINECTOMY NEURO (CUSTOM PROCEDURE TRAY) ×3 IMPLANT
SPONGE SURGIFOAM ABS GEL 100 (HEMOSTASIS) ×1 IMPLANT
SPONGE SURGIFOAM ABS GEL SZ50 (HEMOSTASIS) ×2 IMPLANT
SPONGE T-LAP 4X18 ~~LOC~~+RFID (SPONGE) ×1 IMPLANT
STRIP CLOSURE SKIN 1/2X4 (GAUZE/BANDAGES/DRESSINGS) ×3 IMPLANT
SUT VIC AB 0 CT1 18XCR BRD8 (SUTURE) ×2 IMPLANT
SUT VIC AB 0 CT1 8-18 (SUTURE) ×2
SUT VIC AB 2-0 CT1 18 (SUTURE) ×4 IMPLANT
SUT VIC AB 4-0 PS2 27 (SUTURE) ×3 IMPLANT
TOWEL GREEN STERILE (TOWEL DISPOSABLE) ×3 IMPLANT
TOWEL GREEN STERILE FF (TOWEL DISPOSABLE) ×3 IMPLANT
WATER STERILE IRR 1000ML POUR (IV SOLUTION) ×3 IMPLANT

## 2021-09-23 NOTE — H&P (Signed)
Elijah [name] is an 81 y.o. male.   Chief Complaint: Back right greater than left leg pain neurogenic claudication HPI: 81 year old gentleman progressive worsening back and bilateral hip and leg pain worse on the right as well as neurogenic claudication.  Work-up revealed severe spinal stenosis L2-3, L3-4, L4-5.  Due to patient's progression of clinical syndrome imaging findings and failed conservative treatment I recommended bilateral decompressive laminectomies at those 3 levels.  Have extensively gone over the risks and benefits of the operation with him as well as perioperative course expectations of outcome and alternatives of surgery and he understood and agreed to proceed forward.  Past Medical History:  Diagnosis Date   Adenomatous colon polyp    Arthritis 09/10/2011   rt. hip, knee, hands   Cancer ([place]) 09/10/2011   dx. Prostate cancer-bx. 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   Peripheral neuropathy    Permanent atrial fibrillation ([place]) 09/10/2011   Pre-diabetes    Prostate cancer ([place])    Stroke [facility])     Past Surgical History:  Procedure Laterality Date   COLONOSCOPY  04/2003, 06/05/2011   diverticulosis, internal and external hemorrhoids 2004 and 2012, 2 small polyps 2012   EYE SURGERY  09-10-11   Only stitches to close cut, and observation of hematoma from injury, cataracts bilateral   INGUINAL HERNIA REPAIR Right 06/16/2019   Procedure: OPEN RIGHT INGUINAL HERNIA REPAIR;  Surgeon: Alphonsa Overall, MD;  Location: [place];  Service: General;  Laterality: Right;  OPEN RIGHT INGUINAL HERNIA REPAIR   ROBOT ASSISTED LAPAROSCOPIC RADICAL PROSTATECTOMY  09/15/2011   Procedure: ROBOTIC ASSISTED LAPAROSCOPIC  RADICAL PROSTATECTOMY LEVEL 2;  Surgeon: Dutch Gray, MD;  Location: [facility];  Service: Urology;  Laterality: N/A;         TONSILLECTOMY      Family History  Problem Relation Age of Onset   Diabetes Mother    Hypertension Mother    Diverticulosis Mother    Heart attack Father 60       MI. died at 73   Prostate cancer Brother    Alzheimer's disease Paternal Grandmother    Heart attack Paternal Grandfather    Colon cancer Neg Hx    Social History:  reports that he quit smoking about 53 years ago. His smoking use included cigarettes. He has never used smokeless tobacco. He reports that he does not drink alcohol and does not use drugs.  Allergies:  Allergies  Allergen Reactions   Ciprofloxacin Nausea Only   Flagyl [Metronidazole] Nausea Only    Medications Prior to Admission  Medication Sig Dispense Refill   acetaminophen (TYLENOL) 650 MG CR tablet Take 650 mg by mouth 2 (two) times daily.     cholecalciferol (VITAMIN D3) 25 MCG (1000 UT) tablet Take 1,000 Units by mouth 2 (two) times daily.     famotidine (PEPCID) 20 MG tablet TAKE 1 TABLET(20 MG) BY MOUTH TWICE DAILY 180 tablet 3   metoprolol succinate (TOPROL-XL) 25 MG 24 hr tablet TAKE 1 TABLET BY MOUTH DAILY WITH OR IMMEDIATELY FOLLOWING A MEAL 90 tablet 3   Multiple Vitamin (MULTIVITAMIN WITH MINERALS) TABS tablet Take 1 tablet by mouth  daily.     simvastatin (ZOCOR) 20 MG tablet TAKE 1 TABLET BY MOUTH EVERY DAY 90 tablet 1   XARELTO 20 MG TABS tablet TAKE 1 TABLET(20 MG) BY MOUTH DAILY 90 tablet 1    No results found for this or any previous visit (from the past 48 hour(s)). No results found.  Review of Systems  Musculoskeletal:  Positive for back pain.  Neurological:  Positive for numbness.   Blood pressure (!) 180/95, pulse 73, temperature 98.6 F (37 C), temperature source Oral, resp. rate 18, SpO2 99 %. Physical Exam HENT:     Head: Normocephalic.     Nose: Nose normal.     Mouth/Throat:     Mouth: Mucous  membranes are moist.  Eyes:     Pupils: Pupils are equal, round, and reactive to light.  Cardiovascular:     Rate and Rhythm: Normal rate.  Pulmonary:     Effort: Pulmonary effort is normal.  Abdominal:     General: Abdomen is flat.  Musculoskeletal:        General: Normal range of motion.  Skin:    General: Skin is warm.  Neurological:     Mental Status: He is alert.     Comments: Strength is 5 out of 5 iliopsoas, quad quads, hamstrings, gastrocs, into tibialis, and EHL.     Assessment/Plan 81 year old presents for decompressive laminectomy L2-3, L3-4, L4-5.  Elaina Hoops, MD 09/23/2021, 7:26 AM

## 2021-09-23 NOTE — Anesthesia Procedure Notes (Signed)
Procedure Name: Intubation Date/Time: 09/23/2021 7:39 AM Performed by: Dorann Lodge, CRNA Pre-anesthesia Checklist: Patient identified, Emergency Drugs available, Suction available and Patient being monitored Patient Re-evaluated:Patient Re-evaluated prior to induction Oxygen Delivery Method: Circle System Utilized Preoxygenation: Pre-oxygenation with 100% oxygen Induction Type: IV induction Ventilation: Mask ventilation without difficulty Laryngoscope Size: 4 and Mac Grade View: Grade I Tube type: Oral Tube size: 7.5 mm Number of attempts: 1 Airway Equipment and Method: Stylet Placement Confirmation: ETT inserted through vocal cords under direct vision, positive ETCO2 and breath sounds checked- equal and bilateral Secured at: 24 cm Tube secured with: Tape Dental Injury: Teeth and Oropharynx as per pre-operative assessment

## 2021-09-23 NOTE — Op Note (Signed)
Preoperative diagnosis: Lumbar spinal stenosis L2-3, L3-4, L4-5 neurogenic claudication bilateral L3-L4-L5 and L2 radiculopathies  Postoperative diagnosis: Same  Procedure: Decompressive lumbar laminectomy with partial medial facetectomy at L2-3, L3-4, L4-5 with foraminotomies of the L2, L3, L4, and L5 nerve roots bilaterally  Surgeon: Dominica Severin Tomma Ehinger  Assistant: Nash Shearer  Anesthesia: General  EBL: Minimal  HPI: 81 year old gentleman longstanding back bilateral hip and leg pain worse on the right work-up revealed severe spinal stenosis L2-3 L3-4 L4-5 patient had neurogenic claudication failed all forms of conservative treatment I recommended decompressive laminectomies at those levels.  I extensively went over the risks and benefits of the operation with him as well as perioperative course expectations of outcome and alternatives to surgery and he understood and agreed to proceed forward.  Operative procedure: Patient was brought into the OR was [place] general anesthesia positioned prone on the Wilson frame his back was prepped and draped in routine sterile fashion preoperative x-ray localized the appropriate level so after infiltration of 10 cc lidocaine with epi midline incision was made and Bovie electrocautery was used to take down the subcutaneous tissue and subperiosteal dissection was carried out the lamina of L2, L3, L4 and L5 bilaterally.  Intraoperative x-ray confirmed defecation appropriate levels.  So the spinous process of L2 was removed as well as the spinous process of L3 and L4 and the superior aspect the spinous process of L5 central lamina was then drilled down with a high-speed drill laminotomy was initially begun at L4-5 and I performed complete central decompression at L2-3, L3-4, and L4-5 there was marked hourglass compression of thecal sac at all 3 disc base levels I then drilled down the medial aspect the facet joint and under bit the medial facet joint with a 2 and 3 Miller  Kerrison punch from performing foraminotomies of the L2, L3, L4 and L5 nerve roots.  At the end of decompression there is no further stenosis either centrally or foraminally there is no overt signs of instability.  Meticulous hemostasis was maintained Gelfoam was laid in top of the dura to the wound and then completely irrigated and meticulous hemostasis had been maintained.  I then placed a medium Hemovac drain and closed the wound in layers with interrupted Vicryl and a running 4 subcuticular.  Dermabond benzoin Steri-Strips and a sterile dressing was applied patient recovery in stable condition.  At the end the case all needle counts and sponge counts were correct.

## 2021-09-23 NOTE — Anesthesia Postprocedure Evaluation (Signed)
Anesthesia Post Note  Patient: Lavern Maslow  Procedure(s) Performed: Laminectomy and Foraminotomy - Lumbar Two- Lumbar Three - Lumbar Three- Lumbar Four - Lumbar Four- Lumbar Five (Back)     Patient location during evaluation: PACU Anesthesia Type: General Level of consciousness: sedated Pain management: pain level controlled Vital Signs Assessment: post-procedure vital signs reviewed and stable Respiratory status: spontaneous breathing and respiratory function stable Cardiovascular status: stable Postop Assessment: no apparent nausea or vomiting Anesthetic complications: no   No notable events documented.  Last Vitals:  Vitals:   09/23/21 1045 09/23/21 1119  BP: (!) 150/89 (!) 164/81  Pulse: (!) 58 62  Resp: 16 20  Temp: 36.6 C 36.5 C  SpO2: 99% 100%    Last Pain:  Vitals:   09/23/21 1119  TempSrc: Oral  PainSc:                  Delayza Lungren DANIEL

## 2021-09-23 NOTE — Transfer of Care (Signed)
Immediate Anesthesia Transfer of Care Note  Patient: Elijah [name]  Procedure(s) Performed: Laminectomy and Foraminotomy - Lumbar Two- Lumbar Three - Lumbar Three- Lumbar Four - Lumbar Four- Lumbar Five (Back)  Patient Location: PACU  Anesthesia Type:General  Level of Consciousness: awake and drowsy  Airway & Oxygen Therapy: Patient Spontanous Breathing  Post-op Assessment: Report given to RN and Post -op Vital signs reviewed and stable  Post vital signs: Reviewed and stable  Last Vitals:  Vitals Value Taken Time  BP 155/82 09/23/21 0959  Temp    Pulse 57 09/23/21 1002  Resp 16 09/23/21 1002  SpO2 95 % 09/23/21 1002  Vitals shown include unvalidated device data.  Last Pain:  Vitals:   09/23/21 0615  TempSrc:   PainSc: 0-No pain      Patients Stated Pain Goal: 2 (63/49/49 4473)  Complications: No notable events documented.

## 2021-09-24 ENCOUNTER — Encounter (HOSPITAL_COMMUNITY): Payer: Self-pay | Admitting: Neurosurgery

## 2021-09-24 ENCOUNTER — Telehealth: Payer: Self-pay | Admitting: Family Medicine

## 2021-09-24 ENCOUNTER — Ambulatory Visit: Payer: Medicare Other | Admitting: Family Medicine

## 2021-09-24 DIAGNOSIS — M5416 Radiculopathy, lumbar region: Secondary | ICD-10-CM | POA: Diagnosis not present

## 2021-09-24 DIAGNOSIS — I129 Hypertensive chronic kidney disease with stage 1 through stage 4 chronic kidney disease, or unspecified chronic kidney disease: Secondary | ICD-10-CM | POA: Diagnosis not present

## 2021-09-24 DIAGNOSIS — Z8546 Personal history of malignant neoplasm of prostate: Secondary | ICD-10-CM | POA: Diagnosis not present

## 2021-09-24 DIAGNOSIS — Z9079 Acquired absence of other genital organ(s): Secondary | ICD-10-CM | POA: Diagnosis not present

## 2021-09-24 DIAGNOSIS — N183 Chronic kidney disease, stage 3 unspecified: Secondary | ICD-10-CM | POA: Diagnosis not present

## 2021-09-24 DIAGNOSIS — M48062 Spinal stenosis, lumbar region with neurogenic claudication: Secondary | ICD-10-CM | POA: Diagnosis not present

## 2021-09-24 MED FILL — Thrombin For Soln 5000 Unit: CUTANEOUS | Qty: 4 | Status: AC

## 2021-09-24 NOTE — Telephone Encounter (Signed)
Copied from [place] [id number]. Topic: Medicare AWV >> Sep 24, 2021 10:32 AM Harris-Coley, Hannah Beat wrote: Reason for CRM: Left message for patient to schedule Annual Wellness Visit.  Please schedule with Nurse Health Advisor Charlott Rakes, RN at [facility].  Please call [phone] ask for [facility]

## 2021-09-24 NOTE — Discharge Summary (Cosign Needed)
Physician Discharge Summary  Patient ID: Elijah [name] MRN: [id number] DOB/AGE: 1941/04/05 81 y.o.  Admit date: 09/23/2021 Discharge date: 09/24/2021  Admission Diagnoses:   Lumbar spinal stenosis L2-3, L3-4, L4-5 neurogenic claudication bilateral L3-L4-L5 and L2 radiculopathies    Discharge Diagnoses: same   Discharged Condition: good  Hospital Course: The patient was admitted on 09/23/2021 and taken to the operating room where the patient underwent lumbar laminectomy L2-L3, L3-L4, L4-L5. The patient tolerated the procedure well and was taken to the recovery room and then to the floor in stable condition. The hospital course was routine. There were no complications. The wound remained clean dry and intact. Pt had appropriate back soreness. No complaints of leg pain or new N/T/W. The patient remained afebrile with stable vital signs, and tolerated a regular diet. The patient continued to increase activities, and pain was well controlled with oral pain medications.   Consults: None  Significant Diagnostic Studies:  Results for orders placed or performed during the hospital encounter of 09/19/21  Surgical PCR Screen   Specimen: Nasal Mucosa; Nasal Swab  Result Value Ref Range   MRSA, PCR NEGATIVE NEGATIVE   Staphylococcus aureus NEGATIVE NEGATIVE  SARS CORONAVIRUS 2 (TAT 6-24 HRS) Nasopharyngeal Nasopharyngeal Swab   Specimen: Nasopharyngeal Swab  Result Value Ref Range   SARS Coronavirus 2 NEGATIVE NEGATIVE  Glucose, capillary  Result Value Ref Range   Glucose-Capillary 86 70 - 99 mg/dL  Hemoglobin A1c per protocol  Result Value Ref Range   Hgb A1c MFr Bld 5.8 (H) 4.8 - 5.6 %   Mean Plasma Glucose 119.76 mg/dL  Basic metabolic panel per protocol  Result Value Ref Range   Sodium 141 135 - 145 mmol/L   Potassium 4.4 3.5 - 5.1 mmol/L   Chloride 108 98 - 111 mmol/L   CO2 25 22 - 32 mmol/L   Glucose, Bld 90 70 - 99 mg/dL   BUN 23 8 - 23 mg/dL   Creatinine, Ser 1.30 (H) 0.61 - 1.24  mg/dL   Calcium 9.5 8.9 - 10.3 mg/dL   GFR, Estimated 56 (L) >60 mL/min   Anion gap 8 5 - 15  CBC per protocol  Result Value Ref Range   WBC 7.7 4.0 - 10.5 K/uL   RBC 4.77 4.22 - 5.81 MIL/uL   Hemoglobin 15.0 13.0 - 17.0 g/dL   HCT 45.9 39.0 - 52.0 %   MCV 96.2 80.0 - 100.0 fL   MCH 31.4 26.0 - 34.0 pg   MCHC 32.7 30.0 - 36.0 g/dL   RDW 13.0 11.5 - 15.5 %   Platelets 213 150 - 400 K/uL   nRBC 0.0 0.0 - 0.2 %  Type and screen Carl  Result Value Ref Range   ABO/RH(D) O POS    Antibody Screen NEG    Sample Expiration 10/03/2021,2359    Extend sample reason      NO TRANSFUSIONS OR PREGNANCY IN THE PAST 3 MONTHS Performed at [facility], [address] [place], [place] [address]     DG Lumbar Spine 2-3 Views  Result Date: 09/23/2021 CLINICAL DATA:  Localization for laminectomy and foraminotomy. EXAM: LUMBAR SPINE - 2-3 VIEW COMPARISON:  07/24/2021 FINDINGS: Numbering is based on prior study. Image at 7:48: A surgical instrument is identified posterior to the L3 vertebral body. Degenerative changes are present in the thoracolumbar spine. Image at 8:12 a.m.: Retractor is in place. Surgical instruments overlie the spinous process of L3 and L5. IMPRESSION: Intraoperative localization.  Electronically Signed   By: Nolon Nations M.D.   On: 09/23/2021 08:28    Antibiotics:  Anti-infectives (From admission, onward)    Start     Dose/Rate Route Frequency Ordered Stop   09/23/21 1500  ceFAZolin (ANCEF) IVPB 2g/100 mL premix        2 g 200 mL/hr over 30 Minutes Intravenous Every 8 hours 09/23/21 1041 09/23/21 2249   09/23/21 0600  ceFAZolin (ANCEF) IVPB 2g/100 mL premix        2 g 200 mL/hr over 30 Minutes Intravenous On call to O.R. 09/23/21 6503 09/23/21 0742       Discharge Exam: Blood pressure 126/69, pulse 62, temperature 98.5 F (36.9 C), temperature source Oral, resp. rate 18, SpO2 98 %. Neurologic: Grossly normal Ambulating and voiding well  incision cdi   Discharge Medications:   Allergies as of 09/24/2021       Reactions   Ciprofloxacin Nausea Only   Flagyl [metronidazole] Nausea Only        Medication List     TAKE these medications    acetaminophen 650 MG CR tablet Commonly known as: TYLENOL Take 650 mg by mouth 2 (two) times daily.   cholecalciferol 25 MCG (1000 UNIT) tablet Commonly known as: VITAMIN D3 Take 1,000 Units by mouth 2 (two) times daily.   famotidine 20 MG tablet Commonly known as: PEPCID TAKE 1 TABLET(20 MG) BY MOUTH TWICE DAILY   metoprolol succinate 25 MG 24 hr tablet Commonly known as: TOPROL-XL TAKE 1 TABLET BY MOUTH DAILY WITH OR IMMEDIATELY FOLLOWING A MEAL   multivitamin with minerals Tabs tablet Take 1 tablet by mouth daily.   simvastatin 20 MG tablet Commonly known as: ZOCOR TAKE 1 TABLET BY MOUTH EVERY DAY   Xarelto 20 MG Tabs tablet Generic drug: rivaroxaban TAKE 1 TABLET(20 MG) BY MOUTH DAILY        Disposition: home   Final Dx: lumbar laminectomy L2-3, L3-4, L4-5   Discharge Instructions      Remove dressing in 72 hours   Complete by: As directed    Call MD for:  difficulty breathing, headache or visual disturbances   Complete by: As directed    Call MD for:  hives   Complete by: As directed    Call MD for:  persistant dizziness or light-headedness   Complete by: As directed    Call MD for:  persistant nausea and vomiting   Complete by: As directed    Call MD for:  redness, tenderness, or signs of infection (pain, swelling, redness, odor or green/yellow discharge around incision site)   Complete by: As directed    Call MD for:  severe uncontrolled pain   Complete by: As directed    Call MD for:  temperature >100.4   Complete by: As directed    Diet - low sodium heart healthy   Complete by: As directed    Driving Restrictions   Complete by: As directed    No driving for 2 weeks, no riding in the car for 1 week   Increase activity slowly   Complete  by: As directed    Lifting restrictions   Complete by: As directed    No lifting more than 8 lbs          Signed: Ocie Cornfield Lisbet Busker 09/24/2021, 7:46 AM

## 2021-09-24 NOTE — TOC Progression Note (Signed)
Transition of Care [facility]) - Progression Note    Patient Details  Name: Kentavious Michele MRN: [id number] Date of Birth: Dec 15, 1940  Transition of Care [facility]) CM/SW Contact  Shonn Farruggia, Edson Snowball, RN Phone Number: 09/24/2021, 8:26 AM  Clinical Narrative:       Transition of Care [facility]) Screening Note   Patient Details  Name: Kiowa Hollar Date of Birth: Aug 06, 1940   Transition of Care [facility]) CM/SW Contact:    Marilu Favre, RN Phone Number: 09/24/2021, 8:26 AM    Transition of Care Department (TOC) has reviewed patient and no TOC needs have been identified at this time. We will continue to monitor patient advancement through interdisciplinary progression rounds. If new patient transition needs arise, please place a TOC consult.        Expected Discharge Plan and Services           Expected Discharge Date: 09/24/21                                     Social Determinants of Health (SDOH) Interventions    Readmission Risk Interventions No flowsheet data found.

## 2021-09-24 NOTE — Plan of Care (Signed)
Adequately ready for discharge 

## 2021-09-24 NOTE — Progress Notes (Signed)
Patient alert and oriented, mae's well, voiding adequate amount of urine, swallowing without difficulty, no c/o pain at time of discharge. Patient discharged home with family. Script and discharged instructions given to patient. Patient and family stated understanding of instructions given. Patient has an appointment with Dr. Cram 

## 2021-09-24 NOTE — Evaluation (Signed)
Physical Therapy Evaluation Patient Details Name: Elijah [name] MRN: [id number] DOB: [birth year] Today's Date: 09/24/2021  History of Present Illness  Pt is an 81 y/o male s/p L2-5 decompressive lumbar laminectomy on 09/23/2021. PMH including prostate cancer, a-fib, HTN, neuropathy, CKD, cataract, arthritis, and CVA.   Clinical Impression  Pt admitted with above diagnosis. At the time of PT eval, pt was able to demonstrate transfers and ambulation with gross min guard assist to supervision for safety. Pt was educated on precautions, positioning recommendations, appropriate activity progression, and car transfer. Pt currently with functional limitations due to the deficits listed below (see PT Problem List). Pt will benefit from skilled PT to increase their independence and safety with mobility to allow discharge to the venue listed below.         Recommendations for follow up therapy are one component of a multi-disciplinary discharge planning process, led by the attending physician.  Recommendations may be updated based on patient status, additional functional criteria and insurance authorization.  Follow Up Recommendations Outpatient PT    Assistance Recommended at Discharge PRN  Patient can return home with the following  A little help with walking and/or transfers;Assist for transportation;Help with stairs or ramp for entrance    Equipment Recommendations None recommended by PT  Recommendations for Other Services       Functional Status Assessment Patient has had a recent decline in their functional status and demonstrates the ability to make significant improvements in function in a reasonable and predictable amount of time.     Precautions / Restrictions Precautions Precautions: Fall;Back Precaution Booklet Issued: Yes (comment) Precaution Comments: Reviewed handout and pt was cued for precautions during functional mobility. Required Braces or Orthoses:  (No brace needed  order) Restrictions Weight Bearing Restrictions: No      Mobility  Bed Mobility               General bed mobility comments: Pt was received standing in room.    Transfers Overall transfer level: Needs assistance   Transfers: Sit to/from Stand Sit to Stand: Supervision           General transfer comment: Good hand placement on seated surface for safety. No assist required.    Ambulation/Gait Ambulation/Gait assistance: Min guard assist; Supervision Gait Distance (Feet): 500 Feet Assistive device: None Gait Pattern/deviations: Step-through pattern, Decreased stride length, Trunk flexed, Scissoring Gait velocity: Decreased Gait velocity interpretation: <1.31 ft/sec, indicative of household ambulator   General Gait Details: Occasional cross over step and unsteadiness which pt attributes to neuropathy. No assist required throughout gait training.  Stairs Stairs:  (Reviewed stair negotiation verbally.Pt initially reported he did not have stairs at home and then at end of session reports he occasionally goes up and down a flight at home. Pt declined going back to the stairs to practice.)          Wheelchair Mobility    Modified Rankin (Stroke Patients Only)       Balance Overall balance assessment: No apparent balance deficits (not formally assessed) (Slight post lean but no LOB)                                           Pertinent Vitals/Pain Pain Assessment Pain Assessment: Faces Faces Pain Scale: Hurts a little bit Pain Location: bsck Pain Descriptors / Indicators: Discomfort, Grimacing Pain Intervention(s): Limited activity within patient's tolerance, Monitored during  session, Repositioned    Home Living Family/patient expects to be discharged to:: Private residence Living Arrangements: Spouse/significant other Available Help at Discharge: Family;Available 24 hours/day Type of Home: House Home Access: Stairs to enter Entrance  Stairs-Rails: None Entrance Stairs-Number of Steps: 2 Alternate Level Stairs-Number of Steps: 10 Home Layout: Two level;Able to live on main level with bedroom/bathroom Home Equipment: Shower seat - built in      Prior Function Prior Level of Function : Independent/Modified Independent;Working/employed;Driving                     Hand Dominance   Dominant Hand: Right    Extremity/Trunk Assessment   Upper Extremity Assessment Upper Extremity Assessment: Defer to OT evaluation    Lower Extremity Assessment Lower Extremity Assessment: Generalized weakness (Bilateral neuropathy effecting balance/proprioception)    Cervical / Trunk Assessment Cervical / Trunk Assessment: Back Surgery  Communication   Communication: No difficulties  Cognition Arousal/Alertness: Awake/alert Behavior During Therapy: WFL for tasks assessed/performed Overall Cognitive Status: Within Functional Limits for tasks assessed                                          General Comments      Exercises     Assessment/Plan    PT Assessment Patient needs continued PT services  PT Problem List Decreased strength;Decreased activity tolerance;Decreased balance;Decreased mobility;Decreased knowledge of use of DME;Decreased safety awareness;Decreased knowledge of precautions;Pain       PT Treatment Interventions DME instruction;Gait training;Functional mobility training;Therapeutic activities;Therapeutic exercise;Neuromuscular re-education;Patient/family education    PT Goals (Current goals can be found in the Care Plan section)  Acute Rehab PT Goals Patient Stated Goal: Decrease pain PT Goal Formulation: With patient Time For Goal Achievement: 10/01/21 Potential to Achieve Goals: Good    Frequency Min 5X/week     Co-evaluation               AM-PAC PT "6 Clicks" Mobility  Outcome Measure Help needed turning from your back to your side while in a flat bed without using  bedrails?: None Help needed moving from lying on your back to sitting on the side of a flat bed without using bedrails?: None Help needed moving to and from a bed to a chair (including a wheelchair)?: None Help needed standing up from a chair using your arms (e.g., wheelchair or bedside chair)?: A Little Help needed to walk in hospital room?: A Little Help needed climbing 3-5 steps with a railing? : A Little 6 Click Score: 21    End of Session   Activity Tolerance: Patient tolerated treatment well Patient left: in chair;with call bell/phone within reach Nurse Communication: Mobility status PT Visit Diagnosis: Unsteadiness on feet (R26.81);Pain Pain - part of body:  (back)    Time: 8453-6468 PT Time Calculation (min) (ACUTE ONLY): 17 min   Charges:   PT Evaluation $PT Eval Low Complexity: 1 Low          Rolinda Roan, PT, DPT Acute Rehabilitation Services Pager: [phone] Office: [phone]   Thelma Comp 09/24/2021, 11:04 AM

## 2021-09-24 NOTE — Evaluation (Addendum)
Occupational Therapy Evaluation Patient Details Name: Elijah [name] MRN: [id number] DOB: [birth year] Today's Date: 09/24/2021   History of Present Illness 81 yo male s/p L2-5 decompressive lumbar lami. PMH including prostate cancer, a-fib, HTN, neuropathy, CKD, cataract, arthritis, and CVA.   Clinical Impression   PTA, pt was living with his wife and was independent. Currently, pt performing ADLs and functional mobility at Supervision level. Provided education and handout on back precautions, grooming, LB ADLs, toileting, and shower transfer; pt demonstrated understanding. Answered all pt questions. Recommend dc home once medically stable per physician. All acute OT needs met and will sign off. Thank you.    Recommendations for follow up therapy are one component of a multi-disciplinary discharge planning process, led by the attending physician.  Recommendations may be updated based on patient status, additional functional criteria and insurance authorization.   Follow Up Recommendations  No OT follow up    Assistance Recommended at Discharge Intermittent Supervision/Assistance  Patient can return home with the following      Functional Status Assessment  Patient has had a recent decline in their functional status and demonstrates the ability to make significant improvements in function in a reasonable and predictable amount of time.  Equipment Recommendations  None recommended by OT    Recommendations for Other Services       Precautions / Restrictions Precautions Precautions: Fall      Mobility Bed Mobility Overal bed mobility: Needs Assistance Bed Mobility: Rolling, Sidelying to Sit Rolling: Supervision Sidelying to sit: Supervision       General bed mobility comments: educating on log roll    Transfers Overall transfer level: Needs assistance   Transfers: Sit to/from Stand Sit to Stand: Supervision                  Balance Overall balance assessment: No  apparent balance deficits (not formally assessed) (Slight post lean but no LOB)                                         ADL either performed or assessed with clinical judgement   ADL Overall ADL's : Needs assistance/impaired                                       General ADL Comments: Providing education and handout for back precautions, bed mobility, UB ADLs, grooming, LB ADLs, and toileting. Providing education on use of reacher for donning pants and shoe horn for shoes. Using figure four for socks and shoes for short shoe horn.     Vision         Perception     Praxis      Pertinent Vitals/Pain Pain Assessment Pain Assessment: Faces Faces Pain Scale: Hurts a little bit Pain Location: bsck Pain Descriptors / Indicators: Discomfort, Grimacing Pain Intervention(s): Monitored during session, Limited activity within patient's tolerance, Repositioned     Hand Dominance Right   Extremity/Trunk Assessment Upper Extremity Assessment Upper Extremity Assessment: Overall WFL for tasks assessed   Lower Extremity Assessment Lower Extremity Assessment: Defer to PT evaluation   Cervical / Trunk Assessment Cervical / Trunk Assessment: Back Surgery   Communication Communication Communication: No difficulties   Cognition Arousal/Alertness: Awake/alert Behavior During Therapy: WFL for tasks assessed/performed Overall Cognitive Status: Within Functional Limits for tasks assessed  General Comments    ° °  °Exercises   °  °Shoulder Instructions    ° ° °Home Living Family/patient expects to be discharged to:: Private residence °Living Arrangements: Spouse/significant other °Available Help at Discharge: Family;Available 24 hours/day °Type of Home: House °Home Access: Stairs to enter °Entrance Stairs-Number of Steps: 2 °Entrance Stairs-Rails: None °Home Layout: Two level;Able to live on main level with  bedroom/bathroom °Alternate Level Stairs-Number of Steps: 10 °Alternate Level Stairs-Rails: Right °Bathroom Shower/Tub: Walk-in shower °  °Bathroom Toilet: Standard °  °  °Home Equipment: Shower seat - built in °  °  °  ° °  °Prior Functioning/Environment Prior Level of Function : Independent/Modified Independent;Working/employed;Driving °  °  °  °  °  °  °  °  °  ° °  °  °OT Problem List: Decreased strength;Decreased range of motion;Decreased activity tolerance;Impaired balance (sitting and/or standing) °  °   °OT Treatment/Interventions:    °  °OT Goals(Current goals can be found in the care plan section) Acute Rehab OT Goals °Patient Stated Goal: Go home °OT Goal Formulation: All assessment and education complete, DC therapy  °OT Frequency:   °  ° °Co-evaluation   °  °  °  °  ° °  °AM-PAC OT "6 Clicks" Daily Activity     °Outcome Measure Help from another person eating meals?: None °Help from another person taking care of personal grooming?: None °Help from another person toileting, which includes using toliet, bedpan, or urinal?: A Little °Help from another person bathing (including washing, rinsing, drying)?: A Little °Help from another person to put on and taking off regular upper body clothing?: None °Help from another person to put on and taking off regular lower body clothing?: A Little °6 Click Score: 21 °  °End of Session Nurse Communication: Mobility status ° °Activity Tolerance: Patient tolerated treatment well °Patient left: in chair;with call bell/phone within reach ° °OT Visit Diagnosis: Unsteadiness on feet (R26.81);Other abnormalities of gait and mobility (R26.89);Muscle weakness (generalized) (M62.81)  °              °Time: 0724-0752 °OT Time Calculation (min): 28 min °Charges:  OT General Charges °$OT Visit: 1 Visit °OT Evaluation °$OT Eval Low Complexity: 1 Low °OT Treatments °$Self Care/Home Management : 8-22 mins ° °Charis Capehart MSOT, OTR/L °Acute Rehab °Pager: [phone] °Office:  [phone] ° °Charis M Capehart °09/24/2021, 8:39 AM °

## 2021-10-03 ENCOUNTER — Encounter: Payer: Self-pay | Admitting: Family Medicine

## 2021-10-03 ENCOUNTER — Other Ambulatory Visit: Payer: Self-pay

## 2021-10-03 ENCOUNTER — Encounter: Payer: Self-pay | Admitting: Physician Assistant

## 2021-10-03 ENCOUNTER — Telehealth (INDEPENDENT_AMBULATORY_CARE_PROVIDER_SITE_OTHER): Payer: Medicare Other | Admitting: Physician Assistant

## 2021-10-03 ENCOUNTER — Ambulatory Visit (INDEPENDENT_AMBULATORY_CARE_PROVIDER_SITE_OTHER)
Admission: RE | Admit: 2021-10-03 | Discharge: 2021-10-03 | Disposition: A | Discharge status: Home / Self Care | Payer: Medicare Other | Source: Ambulatory Visit | Attending: Physician Assistant | Admitting: Physician Assistant

## 2021-10-03 DIAGNOSIS — Z9889 Other specified postprocedural states: Secondary | ICD-10-CM

## 2021-10-03 DIAGNOSIS — R159 Full incontinence of feces: Secondary | ICD-10-CM

## 2021-10-03 DIAGNOSIS — R152 Fecal urgency: Secondary | ICD-10-CM

## 2021-10-03 DIAGNOSIS — R32 Unspecified urinary incontinence: Secondary | ICD-10-CM | POA: Diagnosis not present

## 2021-10-03 NOTE — Progress Notes (Signed)
? ?  Virtual Visit via Video Note ? ?I connected with  Elijah [name]  on 10/03/21 at 11:30 AM EST by a video enabled telemedicine application and verified that I am speaking with the correct person using two identifiers. ? ?Location: ?Patient: home ?Provider: Therapist, music at [employer] ?Persons present: Patient and myself ?  ?I discussed the limitations of evaluation and management by telemedicine and the availability of in person appointments. The patient expressed understanding and agreed to proceed.  ? ?History of Present Illness: ? ?Pt had laminectomy and foraminotomy of L2-L5 on 09/23/2021. Back pain is feeling better and feels like this is healing well.  ?Stopped taking pain medication due to constipation after about 3 days, last one being on Friday the 24th, then started on Dulcolax for ? A few days, stopped this over the weekend, now says he is having incontinence "smears" of loose stools.  ?Some urinary incontinence as well.  ? ?He has just stood up and had fecal incontinence a few times this week. ? ?No saddle anesthesia. Walking and getting around well. No trouble with foot drop.  ? ?F/up appt with surgeon is 10/08/2021.  ? ?  ?Observations/Objective: ? ? ?Gen: Awake, alert, no acute distress ?Resp: Breathing is even and non-labored ?Psych: calm/pleasant demeanor ?Neuro: Speech is clear. ? ? ?Assessment and Plan: ? ?1. Incontinence of feces with fecal urgency ?2. Urinary incontinence, unspecified type ?3. Previous back surgery ?-Limited by nature of exam today (phone only) ?-Sounds like patient may still have severe constipation 2/2 recent back surgery and post-surgical opioids, and is having some overflow incontinence. To check this - will send for upright abdominal XR today. Pending moderate to severe stool burden, will need to try to do a clean-out this weekend.  ?-Does not sound like cauda equina syndrome at this time, but discussed red flags to watch for / when to go to ER if  necessary. ?-He should also contact his surgeon with his new symptoms.  ? ? ?Follow Up Instructions: ? ?  ?I discussed the assessment and treatment plan with the patient. The patient was provided an opportunity to ask questions and all were answered. The patient agreed with the plan and demonstrated an understanding of the instructions. ?  ?The patient was advised to call back or seek an in-person evaluation if the symptoms worsen or if the condition fails to improve as anticipated. ? ?Video connection was lost at >50% of the duration of the visit, at which time the remainder of the visit was completed via audio only. ? ?19 min 29 sec total time on the phone ? ?Elijah Mclees M Georgeann Brinkman, PA-C ? ?

## 2021-10-04 ENCOUNTER — Telehealth: Payer: Self-pay | Admitting: Physician Assistant

## 2021-10-04 NOTE — Progress Notes (Signed)
Noted and agreed with Dr. Ansel Bong notes. ?

## 2021-10-04 NOTE — Telephone Encounter (Signed)
I personally called radiology dept and they are moving his reading up this list. Should have this back before the day's end. Thanks for his patience.  ?

## 2021-10-04 NOTE — Telephone Encounter (Signed)
Patient has requested a phone call back asap. Pt stated that he needs to know the next steps after yesterday's appt. Please call at [phone].  ?

## 2021-10-08 ENCOUNTER — Other Ambulatory Visit: Payer: Self-pay | Admitting: Neurosurgery

## 2021-10-08 ENCOUNTER — Ambulatory Visit (HOSPITAL_COMMUNITY)
Admission: RE | Admit: 2021-10-08 | Discharge: 2021-10-08 | Disposition: A | Discharge status: Home / Self Care | Payer: Medicare Other | Source: Ambulatory Visit | Attending: Neurosurgery | Admitting: Neurosurgery

## 2021-10-08 ENCOUNTER — Other Ambulatory Visit: Payer: Self-pay

## 2021-10-08 ENCOUNTER — Other Ambulatory Visit (HOSPITAL_COMMUNITY): Payer: Self-pay | Admitting: Neurosurgery

## 2021-10-08 DIAGNOSIS — M48061 Spinal stenosis, lumbar region without neurogenic claudication: Secondary | ICD-10-CM | POA: Diagnosis not present

## 2021-10-08 DIAGNOSIS — G834 Cauda equina syndrome: Secondary | ICD-10-CM

## 2021-10-08 DIAGNOSIS — M4807 Spinal stenosis, lumbosacral region: Secondary | ICD-10-CM | POA: Diagnosis not present

## 2021-10-08 DIAGNOSIS — M5126 Other intervertebral disc displacement, lumbar region: Secondary | ICD-10-CM | POA: Diagnosis not present

## 2021-10-08 MED ORDER — GADOBUTROL 1 MMOL/ML IV SOLN
9.5000 mL | Freq: Once | INTRAVENOUS | Status: AC | PRN
  Administered 2021-10-08: 9.5 mL via INTRAVENOUS

## 2021-10-09 ENCOUNTER — Telehealth: Payer: Self-pay

## 2021-10-09 NOTE — Telephone Encounter (Signed)
[name] is requesting call a call back in regard to mutual patient.    States it is non urgent.

## 2021-10-09 NOTE — Progress Notes (Incomplete)
Phone [phone] In person visit   Subjective:   Elijah [name] is a 81 y.o. year old very pleasant male patient who presents for/with See problem oriented charting No chief complaint on file.   This visit occurred during the SARS-CoV-2 public health emergency.  Safety protocols were in place, including screening questions prior to the visit, additional usage of staff PPE, and extensive cleaning of exam room while observing appropriate contact time as indicated for disinfecting solutions.   Past Medical History-  Patient Active Problem List   Diagnosis Date Noted   Spinal stenosis of lumbar region 09/23/2021   Double vision 12/14/2017   Gross hematuria 02/12/2017   Hyperglycemia 08/13/2016   History of CVA (cerebrovascular accident) 05/14/2016   Thyroid nodule    History of melanoma 12/04/2015   GERD (gastroesophageal reflux disease) 12/04/2015   CKD (chronic kidney disease), stage III ([place]) 12/07/2014   Osteoarthritis of left hip 08/09/2014   Actinic keratosis 06/05/2014   History of prostate cancer 09/23/2012   Hx of adenomatous colonic polyps 06/05/2011   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   Essential hypertension 04/07/2007   Hyperlipidemia 04/06/2007   Atrial fibrillation ([place]) 04/06/2007    Medications- reviewed and updated Current Outpatient Medications  Medication Sig Dispense Refill   acetaminophen (TYLENOL) 650 MG CR tablet Take 650 mg by mouth 2 (two) times daily.     cholecalciferol (VITAMIN D3) 25 MCG (1000 UT) tablet Take 1,000 Units by mouth 2 (two) times daily.     famotidine (PEPCID) 20 MG tablet TAKE 1 TABLET(20 MG) BY MOUTH TWICE DAILY 180 tablet 3   metoprolol succinate (TOPROL-XL) 25 MG 24 hr tablet TAKE 1 TABLET BY MOUTH DAILY WITH OR IMMEDIATELY FOLLOWING A MEAL 90 tablet 3   Multiple Vitamin (MULTIVITAMIN WITH MINERALS) TABS tablet Take 1 tablet by mouth daily.     simvastatin (ZOCOR) 20 MG tablet TAKE 1 TABLET BY MOUTH EVERY  DAY 90 tablet 1   XARELTO 20 MG TABS tablet TAKE 1 TABLET(20 MG) BY MOUTH DAILY 90 tablet 1   No current facility-administered medications for this visit.     Objective:  There were no vitals taken for this visit. Gen: NAD, resting comfortably CV: RRR no murmurs rubs or gallops Lungs: CTAB no crackles, wheeze, rhonchi Abdomen: soft/nontender/nondistended/normal bowel sounds. No rebound or guarding.  Ext: no edema Skin: warm, dry Neuro: grossly normal, moves all extremities  ***    Assessment and Plan   ***Medicare AWVS: 09/14/2019  #Atrial fibrillation S: Patient compliant with metoprolol 25 mg extended release for rate control. Also uses Xarelto 20 mg for anticoagulation. A/P: ***   #Hyperlipidemia/history of stroke with LDL goal under 70 though likely was embolic S: Compliant with simvastatin 20 mg. LDL most recently at goal under 70 Lab Results  Component Value Date   CHOL 113 09/20/2020   HDL 37.10 (L) 09/20/2020   LDLCALC 54 09/20/2020   LDLDIRECT 74.0 03/09/2019   TRIG 109.0 09/20/2020   CHOLHDL 3 09/20/2020   A/P: ***  #Hypertension S: Compliant with metoprolol 25 mg though primarily for rate control Home readings #s: *** BP Readings from Last 3 Encounters:  09/24/21 126/69  09/19/21 (!) 145/84  08/07/21 120/80  A/P: ***  #CKD stage III S: GFR has been in the 50s *** . Patient knows to avoid NSAIDs-also particularly with anticoagulation. Would prefer H2 blockers over PPI A/P: ***   # Hyperglycemia- had elevated A1c in the past Exercise and diet- *** Lab Results  Component Value Date   HGBA1C 5.8 (H) 09/19/2021   HGBA1C 6.0 03/20/2021   HGBA1C 5.9 09/20/2020    A/P: ***  # idiopathic neuropathy- does not want to use medication   # GERD- famotidine helpful. Better choice with CKD Stage III than PPI   #Thyroid nodule- follows with Dr. Dwyane Dee. We annually Update TSH with labs due to prior thyroid nodule though had benign biopsy in 2018. Updated  ultrasound 2021 stable   # Colon cancer screening-fearful of stroke off of anticoagulation. Fortunately he was able to complete colonoscopy March 23, 2019. Dr. Carlean Purl in letter on April 22, 2019 recommended no further colonoscopies   # History of prostate cancer-still followed with Dr. Alinda Money after robotic prostatectomy  #history of melanoma- followed up every 6 months with dermatology    Health Maintenance Due  Topic Date Due   Zoster Vaccines- Shingrix (1 of 2) Never done   INFLUENZA VACCINE  03/04/2021   Recommended follow up: No follow-ups on file. Future Appointments  Date Time Provider [place]  10/30/2021  9:20 AM [name], MD [facility]  01/28/2022 11:15 AM Elayne Snare, MD [facility] None    Lab/Order associations: No diagnosis found.  No orders of the defined types were placed in this encounter.   I,Jada Bradford,acting as a scribe for [name], MD.,have documented all relevant documentation on the behalf of [name], MD,as directed by  [name], MD while in the presence of [name], MD.  *** Return precautions advised.  Burnett Corrente

## 2021-10-10 DIAGNOSIS — M48062 Spinal stenosis, lumbar region with neurogenic claudication: Secondary | ICD-10-CM | POA: Diagnosis not present

## 2021-10-11 ENCOUNTER — Encounter: Payer: Self-pay | Admitting: Family Medicine

## 2021-10-11 ENCOUNTER — Ambulatory Visit (INDEPENDENT_AMBULATORY_CARE_PROVIDER_SITE_OTHER): Payer: Medicare Other | Admitting: Family Medicine

## 2021-10-11 ENCOUNTER — Telehealth: Payer: Self-pay

## 2021-10-11 VITALS — BP 132/70 | HR 78 | Temp 98.4°F | Ht 75.0 in | Wt 201.6 lb

## 2021-10-11 DIAGNOSIS — R159 Full incontinence of feces: Secondary | ICD-10-CM | POA: Diagnosis not present

## 2021-10-11 DIAGNOSIS — N3942 Incontinence without sensory awareness: Secondary | ICD-10-CM | POA: Diagnosis not present

## 2021-10-11 DIAGNOSIS — R32 Unspecified urinary incontinence: Secondary | ICD-10-CM | POA: Diagnosis not present

## 2021-10-11 DIAGNOSIS — E785 Hyperlipidemia, unspecified: Secondary | ICD-10-CM | POA: Diagnosis not present

## 2021-10-11 DIAGNOSIS — I10 Essential (primary) hypertension: Secondary | ICD-10-CM

## 2021-10-11 DIAGNOSIS — K5900 Constipation, unspecified: Secondary | ICD-10-CM | POA: Diagnosis not present

## 2021-10-11 NOTE — Telephone Encounter (Signed)
Pt seen in office today.

## 2021-10-11 NOTE — Telephone Encounter (Signed)
I tried the # but # I was given went to voicemail and said voicemail box not set up.  ?

## 2021-10-11 NOTE — Progress Notes (Signed)
?Phone [phone] ?In person visit ?  ?Subjective:  ? ?Elijah [name] is a 81 y.o. year old very pleasant male patient who presents for/with See problem oriented charting ?Chief Complaint  ?Patient presents with  ? Follow-up  ?  Incontinence ?  ? ? ?This visit occurred during the SARS-CoV-2 public health emergency.  Safety protocols were in place, including screening questions prior to the visit, additional usage of staff PPE, and extensive cleaning of exam room while observing appropriate contact time as indicated for disinfecting solutions.  ? ?Past Medical History-  ?Patient Active Problem List  ? Diagnosis Date Noted  ? History of CVA (cerebrovascular accident) 05/14/2016  ?  Priority: High  ? History of melanoma 12/04/2015  ?  Priority: High  ? Atrial fibrillation ([place]) 04/06/2007  ?  Priority: High  ? Hyperglycemia 08/13/2016  ?  Priority: Medium   ? Thyroid nodule   ?  Priority: Medium   ? GERD (gastroesophageal reflux disease) 12/04/2015  ?  Priority: Medium   ? CKD (chronic kidney disease), stage III ([place]) 12/07/2014  ?  Priority: Medium   ? History of prostate cancer 09/23/2012  ?  Priority: Medium   ? 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  ?  Priority: Medium   ? Essential hypertension 04/07/2007  ?  Priority: Medium   ? Hyperlipidemia 04/06/2007  ?  Priority: Medium   ? Double vision 12/14/2017  ?  Priority: Low  ? Gross hematuria 02/12/2017  ?  Priority: Low  ? Osteoarthritis of left hip 08/09/2014  ?  Priority: Low  ? Actinic keratosis 06/05/2014  ?  Priority: Low  ? Hx of adenomatous colonic polyps 06/05/2011  ?  Priority: Low  ? Spinal stenosis of lumbar region 09/23/2021  ? ? ?Medications- reviewed and updated ?Current Outpatient Medications  ?Medication Sig Dispense Refill  ? acetaminophen (TYLENOL) 650 MG CR tablet Take 650 mg by mouth 2 (two) times daily.    ? cholecalciferol (VITAMIN D3) 25 MCG (1000 UT) tablet Take 1,000 Units by mouth 2 (two) times daily.    ? famotidine  (PEPCID) 20 MG tablet TAKE 1 TABLET(20 MG) BY MOUTH TWICE DAILY 180 tablet 3  ? metoprolol succinate (TOPROL-XL) 25 MG 24 hr tablet TAKE 1 TABLET BY MOUTH DAILY WITH OR IMMEDIATELY FOLLOWING A MEAL 90 tablet 3  ? Multiple Vitamin (MULTIVITAMIN WITH MINERALS) TABS tablet Take 1 tablet by mouth daily.    ? simvastatin (ZOCOR) 20 MG tablet TAKE 1 TABLET BY MOUTH EVERY DAY 90 tablet 1  ? XARELTO 20 MG TABS tablet TAKE 1 TABLET(20 MG) BY MOUTH DAILY 90 tablet 1  ? ?No current facility-administered medications for this visit.  ? ?  ?Objective:  ?BP 132/70   Pulse 78   Temp 98.4 ?F (36.9 ?C)   Ht '6\' 3"'$  (1.905 m)   Wt 201 lb 9.6 oz (91.4 kg)   SpO2 98%   BMI 25.20 kg/m?  ?Gen: NAD, resting comfortably ?CV: Regular heart rate no murmurs rubs or gallops ?Lungs: CTAB no crackles, wheeze, rhonchi ?Abdomen: soft/nontender/nondistended ?Ext:  stable edema worse on the left ?Skin: warm, dry ?Neuro: Diminished rectal tone and anal wink. ?Rectal exam: Prostate surgically absent.  No stool burden noted ?  ? ?Assessment and Plan  ? ?#Fecal and urinary incontinence ?S: On September 23, 2021 patient had laminectomy L2-L5 due to neurogenic claudication ? ?On March 2 patient reached out to our office and had a virtual visit-back pain was improving after surgery.  Had  stopped taking pain medicine 3 days prior due to constipation and had started on Dulcolax.  After Dulcolax developed incontinence with either loose stools or balls of stool that just simply dropped out.  No saddle anesthesia or leg weakness. ? ?He had a follow-up visit with [name] on 10/08/2021-ended up with another MRI of lumbar spine that night which did not seem to show a cause of incontinence. [name] did not think the surgery directly caused the incontinence through nerve damage-this was patient's chief concern ? ?-Since that time has been set up with with urine culture for urinary incontinence-I believe this is with the neurosurgery office ?-She was also referred  to urology and gastroenterology-had urology appointment today and has GI appointment on Monday with Dr. Arelia Longest ?-He has also noted urinary incontinence starting right after the surgery. Maybe very mild issues prior to surgery but certainly has increased. Has worsened some since started to have fecal incontinence issues. Did somewhat better last night but in daytime wetting a lot of pads. By the time he gets to the bathroom has very little left- does not seem to be able to control urine.  ? ?Saw urology today. Had bladder scan- is voiding bladder completely. Urology did another x-ray and saw large stool burden- they recommended a clean out with miralax- 2 capfuls per day over weekend ? ?No leg weakness and no saddle anesthesia reported during any of these visits ?A/P: 81 year old male with new onset fecal incontinence after laminectomy and worsening urinary incontinence ?- I agree with urine culture ordered by neurosurgery to rule out infection ?- Ongoing issues with constipation and concern for encopresis ?-No obvious significant impaction in the rectum today-had planned to attempt disimpaction ?-I agree with urology about need for more aggressive treatment of significant constipation (based off of x-ray from today which I do not have access to but patient reports)-MiraLAX 2 capfuls daily planned over the weekend ?- Thankful for follow-up scheduled with Dr. Arelia Longest on Monday ?-No leg weakness or saddle anesthesia to suggest cauda equina and MRI did not show this ?-Does have history of nondiabetic neuropathy-and anal tone appears diminished-makes me wonder about neuropathy but would not think would occur so acutely ? ? #Hyperlipidemia/history of stroke with LDL goal under 70 though likely was embolic ?S: Compliant with simvastatin 20 mg.   ?Lab Results  ?Component Value Date  ? CHOL 113 09/20/2020  ? HDL 37.10 (L) 09/20/2020  ? [place] 54 09/20/2020  ? LDLDIRECT 74.0 03/09/2019  ? TRIG 109.0 09/20/2020  ? CHOLHDL 3  09/20/2020  ? A/P: Last LDL was at goal but will be due for repeat on next check-for now continue current medication ?-Also remains on Eliquis for stroke prevention ?  ?  #Hypertension ?S: Compliant with metoprolol though primarily for rate control ?BP Readings from Last 3 Encounters:  ?10/11/21 132/70  ?09/24/21 126/69  ?09/19/21 (!) 145/84  ?  ?A/P:  Controlled. Continue current medications.    ? ?Recommended follow up: Return for next already scheduled visit or sooner if needed. ?Future Appointments  ?Date Time Provider [place]  ?10/14/2021  3:30 PM Gatha Mayer, MD [facility]  ?10/30/2021  9:20 AM [name], MD [facility]  ?01/28/2022 11:15 AM Elayne Snare, MD [facility] None  ? ? ?Lab/Order associations: ?  ICD-10-CM   ?1. Incontinence of feces, unspecified fecal incontinence type  R15.9   ?  ?2. Urinary incontinence, unspecified type  R32   ?  ?3. Essential hypertension  I10   ?  ?  4. Hyperlipidemia, unspecified hyperlipidemia type  E78.5   ?  ? ? ?No orders of the defined types were placed in this encounter. ? ? ?Return precautions advised.  ?[name], MD ? ?

## 2021-10-11 NOTE — Patient Instructions (Addendum)
I think the impaction is causing a lot of these issues- we attempted disimpaction today ? ?I like the miralax idea over the weekend- 2 capfuls is fine per 16 ounces. Be prepared within 24-48 hours for potential mess. Consider using adult pull ups/depends type products.  ? ?Dr. Carlean Purl is excellent- glad you see him on Monday.  ? ?Recommended follow up: Return for next already scheduled visit or sooner if needed. ?

## 2021-10-11 NOTE — Telephone Encounter (Signed)
Pt being seen at 4:20 today. ?

## 2021-10-14 ENCOUNTER — Encounter: Payer: Self-pay | Admitting: Internal Medicine

## 2021-10-14 ENCOUNTER — Ambulatory Visit (INDEPENDENT_AMBULATORY_CARE_PROVIDER_SITE_OTHER): Payer: Medicare Other | Admitting: Internal Medicine

## 2021-10-14 VITALS — BP 128/70 | HR 72 | Ht 75.0 in | Wt 201.8 lb

## 2021-10-14 DIAGNOSIS — R32 Unspecified urinary incontinence: Secondary | ICD-10-CM

## 2021-10-14 DIAGNOSIS — R159 Full incontinence of feces: Secondary | ICD-10-CM | POA: Insufficient documentation

## 2021-10-14 DIAGNOSIS — K649 Unspecified hemorrhoids: Secondary | ICD-10-CM

## 2021-10-14 NOTE — Patient Instructions (Signed)
I want you to empty your bowels with MiraLax purge. ? ?Take 4 dulcolax pills ? ?Wait 1 hour ? ?Then drink 6 doses of MiraLax over 1-2 hours ? ?Hopefully this will empty out the colon and improve things but if it does not I would attribute your problems to the surgery and they may still be even if you get better with the cleanout. ? ?I hope this resolves soon. ? ?Gatha Mayer, MD, [facility] ? ?

## 2021-10-14 NOTE — Progress Notes (Signed)
? ?Elijah [name] 81 y.o. Jun 02, 1941 [id number] ? ?Assessment & Plan:  ? ?Encounter Diagnoses  ?Name Primary?  ? Urinary and fecal incontinence Yes  ? Bleeding hemorrhoids   ? ?I know his x-rays show that he is full of stool but I doubt that is the driving force behind his problems with fecal and urinary incontinence.   He was checked for impaction last week and did not have that problem but was noted to have diminished rectal tone and anal wink.  I am suspicious of but do not have proof of some sort of nerve injury related to his recent spinal surgery that we will hopefully improve with time.  Perhaps there is some edematous tissue that does not show on an MRI, fortunately the MRI did not show any major problems. ? ?I will have him try a MiraLAX purge to see if that makes a difference.  If it does not I do not think I really have anything else to offer him other than tincture of time. ? ?CC: Elijah Olp, MD ?Dr. Kary [name] ?[name], NP ? ? ? ?Subjective:  ? ?Chief Complaint: Fecal incontinence urinary incontinence ? ?HPI ?81 year old white man known to me with prior bleeding hemorrhoids last seen in October 2022, colonoscopy over the years last 03/24/2019 (2 mm polyp (SSP), inflamed rectal tissue (normal biopsies) and internal hemorrhoids) who was in his usual state of health until he had lumbar spine surgery on September 23, 2021.  He had an overnight stay and was okay.  He took narcotic pain medication for several days and had some constipation.  Along the way he started to have urinary and fecal incontinence.  He stopped narcotic pain medicine he has been using MiraLAX.  He saw Dr. [name] and also saw a PA in that practice in the past couple of weeks.  On March 10 he did not have a fecal impaction.  He has had x-rays both through [facility] and at [facility] urology (saw [name] on 10/11/2021 that show increased stool burden.  He had an MRI of the lumbar spine to rule out cauda equina syndrome.  That  was on March 7.  Postop laminectomy R4-Y7 with no complications noted.  Mild to moderate subarticular and spinal stenoses mentioned.  The patient says [name] does not think that the problems came from the surgery.  He did have some minor urinary leakage status post prostatectomy over the years but not like what he is having now. ? ?He does report that he is able to get to the bathroom at night and urinate but during the day he is unable to have sensation of urine coming and leaks and has the same problem with feces at times.  It sounded like after having some better bowel movements with laxatives he was a little bit improved per the urology note of 10/11/2021 and he acknowledges that but he still has problems.  He has a history of bleeding hemorrhoids and has seen similar rectal bleeding, he does take Eliquis. ?Allergies  ?Allergen Reactions  ? Ciprofloxacin Nausea Only  ? Flagyl [Metronidazole] Nausea Only  ? ?Current Meds  ?Medication Sig  ? acetaminophen (TYLENOL) 650 MG CR tablet Take 650 mg by mouth 2 (two) times daily.  ? cholecalciferol (VITAMIN D3) 25 MCG (1000 UT) tablet Take 1,000 Units by mouth 2 (two) times daily.  ? famotidine (PEPCID) 20 MG tablet TAKE 1 TABLET(20 MG) BY MOUTH TWICE DAILY  ? metoprolol succinate (TOPROL-XL) 25 MG 24  hr tablet TAKE 1 TABLET BY MOUTH DAILY WITH OR IMMEDIATELY FOLLOWING A MEAL  ? Multiple Vitamin (MULTIVITAMIN WITH MINERALS) TABS tablet Take 1 tablet by mouth daily.  ? simvastatin (ZOCOR) 20 MG tablet TAKE 1 TABLET BY MOUTH EVERY DAY  ? XARELTO 20 MG TABS tablet TAKE 1 TABLET(20 MG) BY MOUTH DAILY  ? ?Past Medical History:  ?Diagnosis Date  ? Adenomatous colon polyp   ? Arthritis 09/10/2011  ? rt. hip, knee, hands  ? Cancer ([place]) 09/10/2011  ? dx. Prostate cancer-bx. done 12'12, surgery planned  ? Cataract   ? CKD (chronic kidney disease)   ? stage III (09/01/18)  ? Diverticulosis   ? Dysrhythmia   ? GERD (gastroesophageal reflux disease)   ? Hemorrhoid   ? Hx of  adenomatous colonic polyps 06/05/2011  ? Hyperlipidemia   ? Hypertension   ? 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 bilateral ? etiology  ? Peripheral neuropathy   ? Permanent atrial fibrillation ([name]) 09/10/2011  ? Pre-diabetes   ? Prostate cancer ([name])   ? Stroke [facility])   ? ?Past Surgical History:  ?Procedure Laterality Date  ? COLONOSCOPY  04/2003, 06/05/2011  ? diverticulosis, internal and external hemorrhoids 2004 and 2012, 2 small polyps 2012  ? EYE SURGERY  09-10-11  ? Only stitches to close cut, and observation of hematoma from injury, cataracts bilateral  ? INGUINAL HERNIA REPAIR Right 06/16/2019  ? Procedure: OPEN RIGHT INGUINAL HERNIA REPAIR;  Surgeon: Alphonsa Overall, MD;  Location: [place];  Service: General;  Laterality: Right;  OPEN RIGHT INGUINAL HERNIA REPAIR  ? LUMBAR LAMINECTOMY/DECOMPRESSION MICRODISCECTOMY N/A 09/23/2021  ? Procedure: Laminectomy and Foraminotomy - Lumbar Two- Lumbar Three - Lumbar Three- Lumbar Four - Lumbar Four- Lumbar Five;  Surgeon: Elijah Kos, MD;  Location: [place];  Service: Neurosurgery;  Laterality: N/A;  Laminectomy and Foraminotomy - Lumbar Two- Lumbar Three - Lumbar Three- Lumbar Four - Lumbar Four- Lumbar Five  ? ROBOT ASSISTED LAPAROSCOPIC RADICAL PROSTATECTOMY  09/15/2011  ? Procedure: ROBOTIC ASSISTED LAPAROSCOPIC RADICAL PROSTATECTOMY LEVEL 2;  Surgeon: Dutch Gray, MD;  [facility];  Service: Urology;  Laterality: N/A;   ? ? ? ?  ? TONSILLECTOMY    ? ?Social History  ? ?Social History Narrative  ? Married with 3 kids. 6 grandkids.   ?   ? 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   ?   ? Hobbies: beach (place at [employer]) usually every 2 weeks.   ? ?family history includes Alzheimer's disease in his paternal grandmother; Diabetes in his mother; Diverticulosis in his mother; Heart attack in his paternal grandfather; Heart attack (age of onset: 65) in his father; Hypertension in his mother; Prostate cancer in his brother. ? ? ?Review of Systems ?As  per HPI ? ?Objective:  ? Physical Exam ?BP 128/70   Pulse 72   Ht '6\' 3"'$  (1.905 m)   Wt 201 lb 12.8 oz (91.5 kg)   BMI 25.22 kg/m?  ?Elderly white man in no acute distress ?Abdomen is soft and nontender bowel sounds present ?Rectal is declined and was performed 10/11/2021 by primary care provider ? ?

## 2021-10-16 ENCOUNTER — Encounter: Payer: Self-pay | Admitting: Internal Medicine

## 2021-10-16 ENCOUNTER — Other Ambulatory Visit: Payer: Self-pay | Admitting: Internal Medicine

## 2021-10-16 MED ORDER — HYDROCORTISONE (PERIANAL) 2.5 % EX CREA: 1.0000 "application " | TOPICAL_CREAM | Freq: Two times a day (BID) | CUTANEOUS | 1 refills | Status: DC

## 2021-10-16 NOTE — Progress Notes (Signed)
Rx for hemorrhoid sxs - see My Chart ?

## 2021-10-18 ENCOUNTER — Telehealth: Payer: Self-pay | Admitting: Internal Medicine

## 2021-10-18 NOTE — Telephone Encounter (Signed)
Called and left message.

## 2021-10-18 NOTE — Telephone Encounter (Signed)
Patient still experiencing urinary and fecal incontinence  ? ?I told him I am still suspicious that this is neurologic in origin as opposed to GI dysfxn ? ?Will think about this and call him next week ? ?Need to find out if an EMG is possible to sort out etiology ?

## 2021-10-18 NOTE — Telephone Encounter (Signed)
The message was to patient. ?Spoke to [name] and he is perplexed as to why patient has his incontinence issues and all of pre-op sxs are better. ?We agree it is a preioperative issue but cannot pinpoint why. ? ?I am trying to speak to patient further about this and see what else we can do. ?

## 2021-10-18 NOTE — Telephone Encounter (Signed)
[name] called requesting to speak with you, asked if you could give him a call when you get a chance at [phone] by the end of today. ?

## 2021-10-22 ENCOUNTER — Ambulatory Visit: Payer: Medicare Other

## 2021-10-22 ENCOUNTER — Encounter: Payer: Self-pay | Admitting: Internal Medicine

## 2021-10-22 ENCOUNTER — Ambulatory Visit (INDEPENDENT_AMBULATORY_CARE_PROVIDER_SITE_OTHER): Payer: Medicare Other | Admitting: Internal Medicine

## 2021-10-22 ENCOUNTER — Other Ambulatory Visit: Payer: Medicare Other

## 2021-10-22 VITALS — BP 126/68 | HR 72 | Ht 75.0 in | Wt 196.0 lb

## 2021-10-22 DIAGNOSIS — K649 Unspecified hemorrhoids: Secondary | ICD-10-CM

## 2021-10-22 DIAGNOSIS — R195 Other fecal abnormalities: Secondary | ICD-10-CM | POA: Diagnosis not present

## 2021-10-22 DIAGNOSIS — R159 Full incontinence of feces: Secondary | ICD-10-CM

## 2021-10-22 DIAGNOSIS — R32 Unspecified urinary incontinence: Secondary | ICD-10-CM | POA: Diagnosis not present

## 2021-10-22 NOTE — Patient Instructions (Signed)
Your provider has requested that you go to the basement level for lab work before leaving today. Press "B" on the elevator. The lab is located at the first door on the left as you exit the elevator. ? ?Due to recent changes in healthcare laws, you may see the results of your imaging and laboratory studies on MyChart before your provider has had a chance to review them.  We understand that in some cases there may be results that are confusing or concerning to you. Not all laboratory results come back in the same time frame and the provider may be waiting for multiple results in order to interpret others.  Please give [place] 48 hours in order for your provider to thoroughly review all the results before contacting the office for clarification of your results.  ? ?We have placed a referral to pelvic floor P.T. They will call you to set up an appointment. ? ? ?I appreciate the opportunity to care for you. ?Silvano Rusk, MD, [facility] ?

## 2021-10-22 NOTE — Progress Notes (Signed)
? ?Elijah [name] 81 y.o. [birth year] [id number] ? ?Assessment & Plan:  ? ?Encounter Diagnoses  ?Name Primary?  ? Urinary and fecal incontinence Yes  ? Bleeding hemorrhoids   ? Loose stools   ? ? ?I am going to refer him to physical therapy for pelvic floor physical therapy to see if that is able to help.  I still think the primary issue is urinary and anal sphincter problems that occurred after his spinal surgery.  Exact cause is not entirely clear.  I have purged him and constipation is relieved so I do not think that is the etiology.  He does have an inflamed hemorrhoid and he will continue the hydrocortisone therapy.  I am going to have him do a fecal calprotectin and if that is elevated I would need to pursue endoscopic evaluation.  Again the fact that he has both urinary and fecal incontinence goes against a primary GI problem.  It is interesting and somewhat puzzling that his problem is more just when he is erect but perhaps PT or neurosurgery would understand that better.  I do not think there is any way to get an EMG for this. ? ? ? ?Follow-up to be arranged pending the above.  I have advised him to avoid high fiber foods particularly roughage. ? ?CC: [name], MD ?Dr. Kary Kos ?Subjective:  ? ?Chief Complaint: Fecal and urinary incontinence ? ?HPI ?Elijah [name] returns at my request for further evaluation because he has had persistent problems with urinary and fecal incontinence.  I have discussed this with his neurosurgeon [name], and this problem is admittedly perioperative but has been puzzling as the surgery went fine and his postop MRI was without abnormality to explain this.  He continues to have problems with leakage of stool when he is upright, he has urinary leakage problems as well when upright.  When he sleeps at night he is able to feel the urge to urinate and get to the bathroom and control it.  Recall that he has had a prostatectomy but these urinary incontinence issues are different  than what he experienced post prostatectomy which have been minimal.  He continues to have a swollen hemorrhoid he has started to use hydrocortisone cream that I prescribed.  His stools are typically formed in the beginning and then smaller pieces and sometimes loose after that.  He has been able to control his morning bowel movements at times.  He has not taken any antidiarrheals. ?Allergies  ?Allergen Reactions  ? Ciprofloxacin Nausea Only  ? Flagyl [Metronidazole] Nausea Only  ? ?Current Meds  ?Medication Sig  ? acetaminophen (TYLENOL) 650 MG CR tablet Take 650 mg by mouth 2 (two) times daily.  ? cholecalciferol (VITAMIN D3) 25 MCG (1000 UT) tablet Take 1,000 Units by mouth 2 (two) times daily.  ? famotidine (PEPCID) 20 MG tablet TAKE 1 TABLET(20 MG) BY MOUTH TWICE DAILY  ? hydrocortisone (ANUSOL-HC) 2.5 % rectal cream Place 1 application. rectally 2 (two) times daily.  ? metoprolol succinate (TOPROL-XL) 25 MG 24 hr tablet TAKE 1 TABLET BY MOUTH DAILY WITH OR IMMEDIATELY FOLLOWING A MEAL  ? Multiple Vitamin (MULTIVITAMIN WITH MINERALS) TABS tablet Take 1 tablet by mouth daily.  ? simvastatin (ZOCOR) 20 MG tablet TAKE 1 TABLET BY MOUTH EVERY DAY  ? XARELTO 20 MG TABS tablet TAKE 1 TABLET(20 MG) BY MOUTH DAILY  ? ?Past Medical History:  ?Diagnosis Date  ? Adenomatous colon polyp   ? Arthritis 09/10/2011  ? rt.  hip, knee, hands  ? Cancer ([employer]) 09/10/2011  ? dx. Prostate cancer-bx. done 12'12, surgery planned  ? Cataract   ? CKD (chronic kidney disease)   ? stage III (09/01/18)  ? Diverticulosis   ? Dysrhythmia   ? GERD (gastroesophageal reflux disease)   ? Hemorrhoid   ? Hx of adenomatous colonic polyps 06/05/2011  ? Hyperlipidemia   ? Hypertension   ? 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 bilateral ? etiology  ? Peripheral neuropathy   ? Permanent atrial fibrillation ([place]) 09/10/2011  ? Pre-diabetes   ? Prostate cancer ([place])   ? Stroke [facility])   ? ?Past Surgical History:  ?Procedure Laterality Date  ? COLONOSCOPY  04/2003,  06/05/2011  ? diverticulosis, internal and external hemorrhoids 2004 and 2012, 2 small polyps 2012  ? EYE SURGERY  09-10-11  ? Only stitches to close cut, and observation of hematoma from injury, cataracts bilateral  ? INGUINAL HERNIA REPAIR Right 06/16/2019  ? Procedure: OPEN RIGHT INGUINAL HERNIA REPAIR;  Surgeon: Alphonsa Overall, MD;  Location: [place];  Service: General;  Laterality: Right;  OPEN RIGHT INGUINAL HERNIA REPAIR  ? LUMBAR LAMINECTOMY/DECOMPRESSION MICRODISCECTOMY N/A 09/23/2021  ? Procedure: Laminectomy and Foraminotomy - Lumbar Two- Lumbar Three - Lumbar Three- Lumbar Four - Lumbar Four- Lumbar Five;  Surgeon: Kary Kos, MD;  Location: [place];  Service: Neurosurgery;  Laterality: N/A;  Laminectomy and Foraminotomy - Lumbar Two- Lumbar Three - Lumbar Three- Lumbar Four - Lumbar Four- Lumbar Five  ? ROBOT ASSISTED LAPAROSCOPIC RADICAL PROSTATECTOMY  09/15/2011  ? Procedure: ROBOTIC ASSISTED LAPAROSCOPIC RADICAL PROSTATECTOMY LEVEL 2;  Surgeon: Dutch Gray, MD;  [facility];  Service: Urology;  Laterality: N/A;   ? ? ? ?  ? TONSILLECTOMY    ? ?Social History  ? ?Social History Narrative  ? Married with 3 kids. 6 grandkids.   ?   ? 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   ?   ? Hobbies: beach (place at [employer]) usually every 2 weeks.   ? ?family history includes Alzheimer's disease in his paternal grandmother; Diabetes in his mother; Diverticulosis in his mother; Heart attack in his paternal grandfather; Heart attack (age of onset: 68) in his father; Hypertension in his mother; Prostate cancer in his brother. ? ? ?Review of Systems ?See above ? ?Objective:  ? Physical Exam ?BP 126/68   Pulse 72   Ht '6\' 3"'$  (1.905 m)   Wt 196 lb (88.9 kg)   BMI 24.50 kg/m?  ?Elderly white man no acute distress moves slowly to the exam table ? ?Rectal exam reveals no perianal dermatitis.  There are some small fleshy tags, the digital exam reveals what I think is decreased resting tone and  decreased anal tone voluntary squeeze.  He does contract the gluteals with minimal increase in anal tone but I think that is all from the skeletal muscles.  There is no masses nontender.  No stool present.  Anal wink is absent. ? ?Anoscopy is performed and shows an inflamed right posterior grade 2 internal hemorrhoid.  The other anal rectal mucosa that I see looks normal. ? ?

## 2021-10-23 ENCOUNTER — Other Ambulatory Visit: Payer: Medicare Other

## 2021-10-23 ENCOUNTER — Other Ambulatory Visit: Payer: Self-pay

## 2021-10-23 ENCOUNTER — Ambulatory Visit: Payer: Medicare Other | Attending: Internal Medicine

## 2021-10-23 DIAGNOSIS — M6281 Muscle weakness (generalized): Secondary | ICD-10-CM | POA: Diagnosis not present

## 2021-10-23 DIAGNOSIS — R195 Other fecal abnormalities: Secondary | ICD-10-CM | POA: Diagnosis not present

## 2021-10-23 DIAGNOSIS — N3942 Incontinence without sensory awareness: Secondary | ICD-10-CM | POA: Diagnosis not present

## 2021-10-23 DIAGNOSIS — M62838 Other muscle spasm: Secondary | ICD-10-CM

## 2021-10-23 DIAGNOSIS — R279 Unspecified lack of coordination: Secondary | ICD-10-CM

## 2021-10-23 NOTE — Patient Instructions (Signed)
Squatty potty: When your knees are level or below the level of your hips, pelvic floor muscles are pressed against rectum, preventing ease of bowel movement. By getting knees above the level of the hips, these pelvic floor muscles relax, allowing easier passage of bowel movement. ? Ways to get knees above hips: o Squatty Potty (7inch and 9inch versions) o Small stool o Roll of toilet paper under each foot o Hardback book or stack of magazines under each foot  Relaxed Toileting mechanics: Once in this position, make sure to lean forward with forearms on thighs, wide knees, relaxed stomach, and breathe.    Bowel massage: To assist with more regular and more comfortable bowel movements, try performing bowel massage nightly for 5-10 minutes. Place hands in the lower right side of your abdomen to start; in small circles, massage up, across, and down the left side of your abdomen. Pressure does not need to be hard, but just comfortable. You can use lotion or oil to make more comfortable.  

## 2021-10-23 NOTE — Therapy (Signed)
?OUTPATIENT PHYSICAL THERAPY MALE PELVIC EVALUATION ? ? ?Patient Name: Elijah [name] ?MRN: [id number] ?DOB:February 23, 1941, 81 y.o., male ?Today's Date: 10/23/2021 ? ? PT End of Session - 10/23/21 1714   ? ? Visit Number 1   ? Date for PT Re-Evaluation 01/01/22   ? Authorization Type Medicare   ? Progress Note Due on Visit 10   ? PT Start Time 5188   ? PT Stop Time 1616   ? PT Time Calculation (min) 45 min   ? Activity Tolerance Patient tolerated treatment well   ? Behavior During Therapy [facility] for tasks assessed/performed   ? ?  ?  ? ?  ? ? ?Past Medical History:  ?Diagnosis Date  ? Adenomatous colon polyp   ? Arthritis 09/10/2011  ? rt. hip, knee, hands  ? Cancer ([place]) 09/10/2011  ? dx. Prostate cancer-bx. done 12'12, surgery planned  ? Cataract   ? CKD (chronic kidney disease)   ? stage III (09/01/18)  ? Diverticulosis   ? Dysrhythmia   ? GERD (gastroesophageal reflux disease)   ? Hemorrhoid   ? Hx of adenomatous colonic polyps 06/05/2011  ? Hyperlipidemia   ? Hypertension   ? 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 bilateral ? etiology  ? Peripheral neuropathy   ? Permanent atrial fibrillation ([place]) 09/10/2011  ? Pre-diabetes   ? Prostate cancer ([place])   ? Stroke [facility])   ? ?Past Surgical History:  ?Procedure Laterality Date  ? COLONOSCOPY  04/2003, 06/05/2011  ? diverticulosis, internal and external hemorrhoids 2004 and 2012, 2 small polyps 2012  ? EYE SURGERY  09-10-11  ? Only stitches to close cut, and observation of hematoma from injury, cataracts bilateral  ? INGUINAL HERNIA REPAIR Right 06/16/2019  ? Procedure: OPEN RIGHT INGUINAL HERNIA REPAIR;  Surgeon: Alphonsa Overall, MD;  Location: [place];  Service: General;  Laterality: Right;  OPEN RIGHT INGUINAL HERNIA REPAIR  ? LUMBAR LAMINECTOMY/DECOMPRESSION MICRODISCECTOMY N/A 09/23/2021  ? Procedure: Laminectomy and Foraminotomy - Lumbar Two- Lumbar Three - Lumbar Three- Lumbar Four - Lumbar Four- Lumbar Five;  Surgeon: Kary Kos, MD;  Location: [place];  Service: Neurosurgery;   Laterality: N/A;  Laminectomy and Foraminotomy - Lumbar Two- Lumbar Three - Lumbar Three- Lumbar Four - Lumbar Four- Lumbar Five  ? ROBOT ASSISTED LAPAROSCOPIC RADICAL PROSTATECTOMY  09/15/2011  ? Procedure: ROBOTIC ASSISTED LAPAROSCOPIC RADICAL PROSTATECTOMY LEVEL 2;  Surgeon: Dutch Gray, MD;  Location: [facility];  Service: Urology;  Laterality: N/A;   ? ? ? ?  ? TONSILLECTOMY    ? ?Patient Active Problem List  ? Diagnosis Date Noted  ? Urinary and fecal incontinence 10/14/2021  ? Spinal stenosis of lumbar region 09/23/2021  ? Double vision 12/14/2017  ? Gross hematuria 02/12/2017  ? Hyperglycemia 08/13/2016  ? History of CVA (cerebrovascular accident) 05/14/2016  ? Thyroid nodule   ? History of melanoma 12/04/2015  ? GERD (gastroesophageal reflux disease) 12/04/2015  ? CKD (chronic kidney disease), stage III ([place]) 12/07/2014  ? Osteoarthritis of left hip 08/09/2014  ? Actinic keratosis 06/05/2014  ? History of prostate cancer 09/23/2012  ? Hx of adenomatous colonic polyps 06/05/2011  ? 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  ? Essential hypertension 04/07/2007  ? Hyperlipidemia 04/06/2007  ? Atrial fibrillation ([place]) 04/06/2007  ? ? ?PCP: [name], MD ? ?REFERRING PROVIDER: Gatha Mayer, MD ? ?REFERRING DIAG: 09/23/21 ? ?THERAPY DIAG:  ?Other muscle spasm ? ?Muscle weakness (generalized) ? ?Unspecified lack of coordination ? ?ONSET DATE: 09/23/21 ? ?SUBJECTIVE:                                                                                                                                                                                          ? ?  SUBJECTIVE STATEMENT: ?Pt states that he had low back surgery on 09/23/21 and has had fecal and urinary incontinence ever since. He states that he has been told a lot of this may be due to surgery. He denies any issues with fecal or urinary incontinence prior to surgery. He denies low back pain other than soreness. He reports no issues with fecal  incontinence at night, but wakes frequently to urinate. However, he is unable to urinate throughout the day - he will go stand at toilet and not be able to go, but urine drips out throughout the day without sensation. He states that he is unsure of how many bowel movements he has throughout the day because there is so much leaking and inconsistency. He reports eating as healthy of a diet as possible with lots of fruits and veggies.   ? ?Fluid intake: very little fluid intake ?Patient confirms identification and approves PT to assess pelvic floor and treatment No ? ? ?PAIN:  ?Are you having pain? No ? ?PRECAUTIONS: None ? ?WEIGHT BEARING RESTRICTIONS No ? ?FALLS:  ?Has patient fallen in last 6 months? No, Number of falls: 0 ? ?LIVING ENVIRONMENT: ?Lives with: lives with their family ?Lives in: House/apartment ? ?OCCUPATION: Retired ? ?PLOF: Independent ? ?PATIENT GOALS To stop urinary/fecal incontinence ? ?PERTINENT HISTORY:  ?Laminectomy L2-L5 09/23/2021, inguinal hernia repair Rt 2020, RALP 09/15/2011 ?Sexual abuse: No ? ?BOWEL MOVEMENT ?Pain with bowel movement: No ?Type of bowel movement:Type (Bristol Stool Scale) 1-7, sometimes a combination all at the same time, Frequency very hard to answer- throughout the day, and Strain No - aggravated hemorrhoid ?Fully empty rectum: No ?Leakage: Yes: see above ?Pads: Yes: briefs ?Fiber supplement: No ?Patient is sleeping through the night without bowel issues ? ?URINATION ?Pain with urination: No ?Fully empty bladder: No ?Stream:  diffiuclt to start during the day ?Urgency: No ?Frequency: during the day, no control at all - 4x/night without difficulty ?Leakage:  leaking throughout the day, but no sensation of leaking ?Pads: Yes: briefs ? ?INTERCOURSE ?Pain with intercourse:  none ?Climax: NA ?Ejaculation: No ? ? ? ? ?OBJECTIVE:  ? ?DIAGNOSTIC FINDINGS:  ?MRI performed after surgery with no significant findings to help explain incontinence ? ?COGNITION: ? Overall cognitive  status: Within functional limits for tasks assessed   ?  ? ? ?FUNCTIONAL TESTS: unable to perform sit<>stand without bil assist with Rt anterior hip pain.  ? ? ?GAIT: Rt LE antalgic gait pattern with reduced gait speed ? ?POSTURE:  ?Posterior pelvic tilt, unable to reach neutral lumbar extension in standing ? ?LUMBARAROM/PROM ? ?A/PROM A/PROM  ?10/23/2021  ?Flexion 75%  ?Extension 0% - remains in small amount of lumbar flexion  ?Right lateral flexion 25% - pain in Rt groin  ?Left lateral flexion 75%  ?Right rotation 25%  ?Left rotation 50%  ? (Blank rows = not tested) ? ? ?LE MMT: ? ?MMT Right ?10/23/2021 Left ?10/23/2021  ?Hip flexion 4-/5 4-/5  ?Hip extension    ?Hip abduction WNL WNL  ?Hip adduction WNL WNL  ?Hip internal rotation WNL, pain in Rt groin WNL  ?Hip external rotation WNL WNL  ?Knee flexion    ?Knee extension    ?Ankle dorsiflexion    ?Ankle plantarflexion    ?Ankle inversion    ?Ankle eversion    ? ?PELVIC MMT: patient deferred pelvic floor examination at this time due to hemorrhoid being very uncomfortable and having had pelvic floor rectal examination yesterday ?  ?MMT  ?10/23/2021  ?Internal Anal  Sphincter   ?External Anal Sphincter   ?Puborectalis   ?Diastasis Recti   ?(Blank rows = not tested) ? ?PALPATION: ?GENERAL significant abdominal restriction, reduced rub mobility ? ?            External Perineal Exam Pt consented to external assessment of pelvic floor contraction over clothing; he performs pelvic floor contraction with abdomen and glutes, not coordinating appropriate contraction. With multimodal cues, he was able to improve isolation of pelvic floor contraction and decrease abdominal activation ? ?            Internal Pelvic Floor Not performed this treatment session ? ?TONE: ?NA ? ?TODAY'S TREATMENT 10/23/21 ?EVAL  ?Treatment: ?-pt education on pelvic floor PT, impairments that may be contributing to overall condition, self-bowel massage, squatty potty/relaxed voiding mechanics, and pelvic  floor contraction/kegel training ? ? ?PATIENT EDUCATION:  ?Education details: pt education on pelvic floor PT, impairments that may be contributing to overall condition, self-bowel massage, squatty potty/relaxed voi

## 2021-10-26 ENCOUNTER — Encounter: Payer: Self-pay | Admitting: Internal Medicine

## 2021-10-26 LAB — CALPROTECTIN, FECAL: Calprotectin, Fecal: 33 ug/g (ref 0–120)

## 2021-10-28 ENCOUNTER — Other Ambulatory Visit: Payer: Self-pay | Admitting: Internal Medicine

## 2021-10-30 ENCOUNTER — Encounter: Payer: Self-pay | Admitting: Family Medicine

## 2021-10-30 ENCOUNTER — Ambulatory Visit (INDEPENDENT_AMBULATORY_CARE_PROVIDER_SITE_OTHER): Payer: Medicare Other | Admitting: Family Medicine

## 2021-10-30 VITALS — BP 116/66 | HR 73 | Temp 98.4°F | Ht 75.0 in | Wt 198.0 lb

## 2021-10-30 DIAGNOSIS — N183 Chronic kidney disease, stage 3 unspecified: Secondary | ICD-10-CM | POA: Diagnosis not present

## 2021-10-30 DIAGNOSIS — R739 Hyperglycemia, unspecified: Secondary | ICD-10-CM

## 2021-10-30 DIAGNOSIS — E785 Hyperlipidemia, unspecified: Secondary | ICD-10-CM

## 2021-10-30 DIAGNOSIS — K219 Gastro-esophageal reflux disease without esophagitis: Secondary | ICD-10-CM | POA: Diagnosis not present

## 2021-10-30 DIAGNOSIS — E041 Nontoxic single thyroid nodule: Secondary | ICD-10-CM

## 2021-10-30 DIAGNOSIS — Z8546 Personal history of malignant neoplasm of prostate: Secondary | ICD-10-CM | POA: Diagnosis not present

## 2021-10-30 DIAGNOSIS — I10 Essential (primary) hypertension: Secondary | ICD-10-CM

## 2021-10-30 DIAGNOSIS — I4821 Permanent atrial fibrillation: Secondary | ICD-10-CM | POA: Diagnosis not present

## 2021-10-30 LAB — COMPREHENSIVE METABOLIC PANEL
ALT: 15 U/L (ref 0–53)
AST: 14 U/L (ref 0–37)
Albumin: 4 g/dL (ref 3.5–5.2)
Alkaline Phosphatase: 94 U/L (ref 39–117)
BUN: 28 mg/dL — ABNORMAL HIGH (ref 6–23)
CO2: 26 mEq/L (ref 19–32)
Calcium: 9.8 mg/dL (ref 8.4–10.5)
Chloride: 106 mEq/L (ref 96–112)
Creatinine, Ser: 1.25 mg/dL (ref 0.40–1.50)
GFR: 54.46 mL/min — ABNORMAL LOW (ref >60.00)
Glucose, Bld: 92 mg/dL (ref 70–99)
Potassium: 5.2 mEq/L — ABNORMAL HIGH (ref 3.5–5.1)
Sodium: 139 mEq/L (ref 135–145)
Total Bilirubin: 0.5 mg/dL (ref 0.2–1.2)
Total Protein: 6.3 g/dL (ref 6.0–8.3)

## 2021-10-30 LAB — LIPID PANEL
Cholesterol: 130 mg/dL (ref 0–200)
HDL: 38.1 mg/dL — ABNORMAL LOW (ref >39.00)
LDL Cholesterol: 63 mg/dL (ref 0–99)
NonHDL: 91.52
Total CHOL/HDL Ratio: 3
Triglycerides: 141 mg/dL (ref 0.0–149.0)
VLDL: 28.2 mg/dL (ref 0.0–40.0)

## 2021-10-30 LAB — CBC WITH DIFFERENTIAL/PLATELET
Basophils Absolute: 0.1 10*3/uL (ref 0.0–0.1)
Basophils Relative: 1.3 % (ref 0.0–3.0)
Eosinophils Absolute: 0.4 10*3/uL (ref 0.0–0.7)
Eosinophils Relative: 5.4 % — ABNORMAL HIGH (ref 0.0–5.0)
HCT: 41.8 % (ref 39.0–52.0)
Hemoglobin: 14 g/dL (ref 13.0–17.0)
Lymphocytes Relative: 10.9 % — ABNORMAL LOW (ref 12.0–46.0)
Lymphs Abs: 0.9 10*3/uL (ref 0.7–4.0)
MCHC: 33.5 g/dL (ref 30.0–36.0)
MCV: 93.2 fl (ref 78.0–100.0)
Monocytes Absolute: 0.8 10*3/uL (ref 0.1–1.0)
Monocytes Relative: 10.2 % (ref 3.0–12.0)
Neutro Abs: 5.9 10*3/uL (ref 1.4–7.7)
Neutrophils Relative %: 72.2 % (ref 43.0–77.0)
Platelets: 202 10*3/uL (ref 150.0–400.0)
RBC: 4.49 Mil/uL (ref 4.22–5.81)
RDW: 14.1 % (ref 11.5–15.5)
WBC: 8.2 10*3/uL (ref 4.0–10.5)

## 2021-10-30 LAB — TSH: TSH: 0.63 u[IU]/mL (ref 0.35–5.50)

## 2021-10-30 NOTE — Patient Instructions (Addendum)
Please stop by lab before you go ?If you have mychart- we will send your results within 3 business days of [place] receiving them.  ?If you do not have mychart- we will call you about results within 5 business days of [place] receiving them.  ?*please also note that you will see labs on mychart as soon as they post. I will later go in and write notes on them- will say "notes from Dr. [name]"  ? ?Recommended follow up: Return in about 6 months (around 05/02/2022) for followup or sooner if needed.Schedule b4 you leave. ?

## 2021-10-31 NOTE — Telephone Encounter (Signed)
Pt was scheduled for a Follow Up Appointment in May: 12/04/2021 at 11:10 AM to see Dr. Carlean Purl : Pt made aware. Pt verbalized understanding with all questions answered.  ?

## 2021-11-13 ENCOUNTER — Ambulatory Visit: Payer: Medicare Other

## 2021-11-27 ENCOUNTER — Ambulatory Visit: Payer: Medicare Other | Attending: Internal Medicine

## 2021-11-27 DIAGNOSIS — M62838 Other muscle spasm: Secondary | ICD-10-CM | POA: Diagnosis not present

## 2021-11-27 DIAGNOSIS — R279 Unspecified lack of coordination: Secondary | ICD-10-CM | POA: Insufficient documentation

## 2021-11-27 DIAGNOSIS — M6281 Muscle weakness (generalized): Secondary | ICD-10-CM | POA: Diagnosis not present

## 2021-11-27 NOTE — Patient Instructions (Signed)
Bowel massage: ?To assist with more regular and more comfortable bowel movements, try ?performing bowel massage nightly for 5-10 minutes. ?Place hands in the lower right side of your abdomen to start; in small ?circles, massage up, across, and down the left side of your abdomen. ?Pressure does not need to be hard, but just comfortable. ?You can use lotion or oil to make more comfortable. ? ? ?Squatty potty: ?When your knees are level or below the level of your hips, pelvic floor ?muscles are pressed against rectum, preventing ease of bowel movement. ?By getting knees above the level of the hips, these pelvic floor muscles ?relax, allowing easier passage of bowel movement. ?? Ways to get knees above hips: ?o Physiological scientist (7inch and 9inch versions) ?o Small stool ?o Roll of toilet paper under each foot ?o Hardback book or stack of magazines under each foot ? ?Relaxed Toileting mechanics: ?Once in this position, make sure to lean forward with forearms on thighs, ?wide knees, relaxed stomach, and breathe. ? ?Bladder/bowel retraining: ? ?Drink 4-8oz an hour ?ONLY WATER ?Stop water intake 3 hours before bed ?Try to go at least 2-3 hours between trips to the bathroom - go whether you need to or not. ? ?For two weeks. ? ?

## 2021-11-27 NOTE — Therapy (Signed)
?OUTPATIENT PHYSICAL THERAPY TREATMENT NOTE ? ? ?Patient Name: Elijah [name] ?MRN: [id number] ?DOB:[birth year], 81 y.o., male ?Today's Date: 11/27/2021 ? ?PCP: [name], MD ?REFERRING PROVIDER: Gatha Mayer, MD ? ?END OF SESSION:  ? PT End of Session - 11/27/21 1445   ? ? Visit Number 2   ? Date for PT Re-Evaluation 01/01/22   ? Authorization Type Medicare   ? Progress Note Due on Visit 10   ? PT Start Time 4270   ? PT Stop Time 1525   ? PT Time Calculation (min) 40 min   ? Activity Tolerance Patient tolerated treatment well   ? Behavior During Therapy [facility] for tasks assessed/performed   ? ?  ?  ? ?  ? ? ?Past Medical History:  ?Diagnosis Date  ? Adenomatous colon polyp   ? Arthritis 09/10/2011  ? rt. hip, knee, hands  ? Cancer ([place]) 09/10/2011  ? dx. Prostate cancer-bx. done 12'12, surgery planned  ? Cataract   ? CKD (chronic kidney disease)   ? stage III (09/01/18)  ? Diverticulosis   ? Dysrhythmia   ? GERD (gastroesophageal reflux disease)   ? Hemorrhoid   ? Hx of adenomatous colonic polyps 06/05/2011  ? Hyperlipidemia   ? Hypertension   ? 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 bilateral ? etiology  ? Peripheral neuropathy   ? Permanent atrial fibrillation ([place]) 09/10/2011  ? Pre-diabetes   ? Prostate cancer ([place])   ? Stroke [facility])   ? ?Past Surgical History:  ?Procedure Laterality Date  ? COLONOSCOPY  04/2003, 06/05/2011  ? diverticulosis, internal and external hemorrhoids 2004 and 2012, 2 small polyps 2012  ? EYE SURGERY  09-10-11  ? Only stitches to close cut, and observation of hematoma from injury, cataracts bilateral  ? INGUINAL HERNIA REPAIR Right 06/16/2019  ? Procedure: OPEN RIGHT INGUINAL HERNIA REPAIR;  Surgeon: Alphonsa Overall, MD;  Location: [place];  Service: General;  Laterality: Right;  OPEN RIGHT INGUINAL HERNIA REPAIR  ? LUMBAR LAMINECTOMY/DECOMPRESSION MICRODISCECTOMY N/A 09/23/2021  ? Procedure: Laminectomy and Foraminotomy - Lumbar Two- Lumbar Three - Lumbar Three- Lumbar Four - Lumbar Four- Lumbar  Five;  Surgeon: Kary Kos, MD;  Location: [place];  Service: Neurosurgery;  Laterality: N/A;  Laminectomy and Foraminotomy - Lumbar Two- Lumbar Three - Lumbar Three- Lumbar Four - Lumbar Four- Lumbar Five  ? ROBOT ASSISTED LAPAROSCOPIC RADICAL PROSTATECTOMY  09/15/2011  ? Procedure: ROBOTIC ASSISTED LAPAROSCOPIC RADICAL PROSTATECTOMY LEVEL 2;  Surgeon: Dutch Gray, MD;  Location: [facility];  Service: Urology;  Laterality: N/A;   ? ? ? ?  ? TONSILLECTOMY    ? ?Patient Active Problem List  ? Diagnosis Date Noted  ? Urinary and fecal incontinence 10/14/2021  ? Spinal stenosis of lumbar region 09/23/2021  ? Double vision 12/14/2017  ? Gross hematuria 02/12/2017  ? Hyperglycemia 08/13/2016  ? History of CVA (cerebrovascular accident) 05/14/2016  ? Thyroid nodule   ? History of melanoma 12/04/2015  ? GERD (gastroesophageal reflux disease) 12/04/2015  ? CKD (chronic kidney disease), stage III ([place]) 12/07/2014  ? Osteoarthritis of left hip 08/09/2014  ? Actinic keratosis 06/05/2014  ? History of prostate cancer 09/23/2012  ? Hx of adenomatous colonic polyps 06/05/2011  ? 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  ? Essential hypertension 04/07/2007  ? Hyperlipidemia 04/06/2007  ? Atrial fibrillation ([place]) 04/06/2007  ? ? ?REFERRING DIAG: R32,R15.9 (ICD-10-CM) - Urinary and fecal incontinence ? ?THERAPY DIAG:  ?Other muscle spasm ? ?Muscle weakness (generalized) ? ?Unspecified lack  of coordination ? ?PERTINENT HISTORY: Laminectomy L2-L5 09/23/2021, inguinal hernia repair Rt 2020, RALP 09/15/2011 ? ?PRECAUTIONS: NA ? ?SUBJECTIVE: Pt states that he has been working on pelvic floor exercises lying down and sitting. He reports that over the last two weeks, he has not had an accident after the morning. However, in the morning, he has a bowel movement, sits there for a while, and then gets up and has little leakage. After that he doesn't have any leaking. He reports no issues with urinary incontinence during the day, but  is still having issues at night. Patient states that he probably does not drink enough water, but is still eating very healthy.  ? ?PAIN:  ?Are you having pain? No ? ? ?SUBJECTIVE STATEMENT: ?Pt states that he had low back surgery on 09/23/21 and has had fecal and urinary incontinence ever since. He states that he has been told a lot of this may be due to surgery. He denies any issues with fecal or urinary incontinence prior to surgery. He denies low back pain other than soreness. He reports no issues with fecal incontinence at night, but wakes frequently to urinate. However, he is unable to urinate throughout the day - he will go stand at toilet and not be able to go, but urine drips out throughout the day without sensation. He states that he is unsure of how many bowel movements he has throughout the day because there is so much leaking and inconsistency. He reports eating as healthy of a diet as possible with lots of fruits and veggies.   ?  ?Fluid intake: very little fluid intake ?Patient confirms identification and approves PT to assess pelvic floor and treatment No ?  ?  ?PAIN:  ?Are you having pain? No ?  ?PRECAUTIONS: None ?  ?WEIGHT BEARING RESTRICTIONS No ?  ?FALLS:  ?Has patient fallen in last 6 months? No, Number of falls: 0 ?  ?LIVING ENVIRONMENT: ?Lives with: lives with their family ?Lives in: House/apartment ?  ?OCCUPATION: Retired ?  ?PLOF: Independent ?  ?PATIENT GOALS To stop urinary/fecal incontinence ?  ?PERTINENT HISTORY:  ?Laminectomy L2-L5 09/23/2021, inguinal hernia repair Rt 2020, RALP 09/15/2011 ?Sexual abuse: No ?  ?BOWEL MOVEMENT ?Pain with bowel movement: No ?Type of bowel movement:Type (Bristol Stool Scale) 1-7, sometimes a combination all at the same time, Frequency very hard to answer- throughout the day, and Strain No - aggravated hemorrhoid ?Fully empty rectum: No ?Leakage: Yes: see above ?Pads: Yes: briefs ?Fiber supplement: No ?Patient is sleeping through the night without bowel  issues ?  ?URINATION ?Pain with urination: No ?Fully empty bladder: No ?Stream:  diffiuclt to start during the day ?Urgency: No ?Frequency: during the day, no control at all - 4x/night without difficulty ?Leakage:  leaking throughout the day, but no sensation of leaking ?Pads: Yes: briefs ?  ?INTERCOURSE ?Pain with intercourse:  none ?Climax: NA ?Ejaculation: No ?  ?  ?  ?  ?OBJECTIVE:  ?  ?DIAGNOSTIC FINDINGS:  ?MRI performed after surgery with no significant findings to help explain incontinence ?  ?COGNITION: ?           Overall cognitive status: Within functional limits for tasks assessed              ?            ?  ?  ?FUNCTIONAL TESTS: unable to perform sit<>stand without bil assist with Rt anterior hip pain.  ?  ?  ?GAIT: Rt LE antalgic gait  pattern with reduced gait speed ?  ?POSTURE:  ?Posterior pelvic tilt, unable to reach neutral lumbar extension in standing ?  ?LUMBARAROM/PROM ?  ?A/PROM A/PROM  ?10/23/2021  ?Flexion 75%  ?Extension 0% - remains in small amount of lumbar flexion  ?Right lateral flexion 25% - pain in Rt groin  ?Left lateral flexion 75%  ?Right rotation 25%  ?Left rotation 50%  ? (Blank rows = not tested) ?  ?  ?LE MMT: ?  ?MMT Right ?10/23/2021 Left ?10/23/2021  ?Hip flexion 4-/5 4-/5  ?Hip extension      ?Hip abduction WNL WNL  ?Hip adduction WNL WNL  ?Hip internal rotation WNL, pain in Rt groin WNL  ?Hip external rotation WNL WNL  ?Knee flexion      ?Knee extension      ?Ankle dorsiflexion      ?Ankle plantarflexion      ?Ankle inversion      ?Ankle eversion      ?  ?PELVIC MMT: patient deferred pelvic floor examination at this time due to hemorrhoid being very uncomfortable and having had pelvic floor rectal examination yesterday ?  ?MMT   ?10/23/2021  ?Internal Anal Sphincter    ?External Anal Sphincter    ?Puborectalis    ?Diastasis Recti    ?(Blank rows = not tested) ?  ?PALPATION: ?GENERAL significant abdominal restriction, reduced rub mobility ?  ?            External Perineal Exam  Pt consented to external assessment of pelvic floor contraction over clothing; he performs pelvic floor contraction with abdomen and glutes, not coordinating appropriate contraction. With multimodal cues, he w

## 2021-12-04 ENCOUNTER — Ambulatory Visit (INDEPENDENT_AMBULATORY_CARE_PROVIDER_SITE_OTHER): Payer: Medicare Other | Admitting: Internal Medicine

## 2021-12-04 ENCOUNTER — Ambulatory Visit: Payer: Medicare Other | Attending: Internal Medicine

## 2021-12-04 ENCOUNTER — Encounter: Payer: Self-pay | Admitting: Internal Medicine

## 2021-12-04 VITALS — BP 130/70 | HR 64 | Ht 75.0 in | Wt 201.0 lb

## 2021-12-04 DIAGNOSIS — M6281 Muscle weakness (generalized): Secondary | ICD-10-CM | POA: Diagnosis not present

## 2021-12-04 DIAGNOSIS — R279 Unspecified lack of coordination: Secondary | ICD-10-CM | POA: Diagnosis not present

## 2021-12-04 DIAGNOSIS — M62838 Other muscle spasm: Secondary | ICD-10-CM | POA: Diagnosis not present

## 2021-12-04 DIAGNOSIS — R32 Unspecified urinary incontinence: Secondary | ICD-10-CM

## 2021-12-04 DIAGNOSIS — R159 Full incontinence of feces: Secondary | ICD-10-CM | POA: Diagnosis not present

## 2021-12-04 DIAGNOSIS — K588 Other irritable bowel syndrome: Secondary | ICD-10-CM

## 2021-12-04 NOTE — Therapy (Signed)
?OUTPATIENT PHYSICAL THERAPY TREATMENT NOTE ? ? ?Patient Name: Elijah [name] ?MRN: [id number] ?DOB:1940/12/24, 81 y.o., male ?Today's Date: 12/04/2021 ? ?PCP: [name], MD ?REFERRING PROVIDER: Gatha Mayer, MD ? ?END OF SESSION:  ? PT End of Session - 12/04/21 1444   ? ? Visit Number 3   ? Date for PT Re-Evaluation 01/01/22   ? Authorization Type Medicare   ? Progress Note Due on Visit 10   ? PT Start Time 8469   ? PT Stop Time 1525   ? PT Time Calculation (min) 40 min   ? Activity Tolerance Patient tolerated treatment well   ? Behavior During Therapy [facility] for tasks assessed/performed   ? ?  ?  ? ?  ? ? ? ?Past Medical History:  ?Diagnosis Date  ? Adenomatous colon polyp   ? Arthritis 09/10/2011  ? rt. hip, knee, hands  ? Cancer ([place]) 09/10/2011  ? dx. Prostate cancer-bx. done 12'12, surgery planned  ? Cataract   ? CKD (chronic kidney disease)   ? stage III (09/01/18)  ? Diverticulosis   ? Dysrhythmia   ? GERD (gastroesophageal reflux disease)   ? Hemorrhoid   ? Hx of adenomatous colonic polyps 06/05/2011  ? Hyperlipidemia   ? Hypertension   ? 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 bilateral ? etiology  ? Peripheral neuropathy   ? Permanent atrial fibrillation ([place]) 09/10/2011  ? Pre-diabetes   ? Prostate cancer ([place])   ? Stroke [facility])   ? ?Past Surgical History:  ?Procedure Laterality Date  ? COLONOSCOPY  04/2003, 06/05/2011  ? diverticulosis, internal and external hemorrhoids 2004 and 2012, 2 small polyps 2012  ? EYE SURGERY  09/10/2011  ? Only stitches to close cut, and observation of hematoma from injury, cataracts bilateral  ? INGUINAL HERNIA REPAIR Right 06/16/2019  ? Procedure: OPEN RIGHT INGUINAL HERNIA REPAIR;  Surgeon: Alphonsa Overall, MD;  Location: [place];  Service: General;  Laterality: Right;  OPEN RIGHT INGUINAL HERNIA REPAIR  ? LUMBAR LAMINECTOMY/DECOMPRESSION MICRODISCECTOMY N/A 09/23/2021  ? Procedure: Laminectomy and Foraminotomy - Lumbar Two- Lumbar Three - Lumbar Three- Lumbar Four - Lumbar Four-  Lumbar Five;  Surgeon: Kary Kos, MD;  Location: [place];  Service: Neurosurgery;  Laterality: N/A;  Laminectomy and Foraminotomy - Lumbar Two- Lumbar Three - Lumbar Three- Lumbar Four - Lumbar Four- Lumbar Five  ? ROBOT ASSISTED LAPAROSCOPIC RADICAL PROSTATECTOMY  09/15/2011  ? Procedure: ROBOTIC ASSISTED LAPAROSCOPIC RADICAL PROSTATECTOMY LEVEL 2;  Surgeon: Dutch Gray, MD;  [facility];  Service: Urology;  Laterality: N/A;   ? ? ? ?  ? TONSILLECTOMY    ? ?Patient Active Problem List  ? Diagnosis Date Noted  ? Urinary and fecal incontinence 10/14/2021  ? Spinal stenosis of lumbar region 09/23/2021  ? Double vision 12/14/2017  ? Gross hematuria 02/12/2017  ? Hyperglycemia 08/13/2016  ? History of CVA (cerebrovascular accident) 05/14/2016  ? Thyroid nodule   ? History of melanoma 12/04/2015  ? GERD (gastroesophageal reflux disease) 12/04/2015  ? CKD (chronic kidney disease), stage III ([place]) 12/07/2014  ? Osteoarthritis of left hip 08/09/2014  ? Actinic keratosis 06/05/2014  ? History of prostate cancer 09/23/2012  ? Hx of adenomatous colonic polyps 06/05/2011  ? 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  ? Essential hypertension 04/07/2007  ? Hyperlipidemia 04/06/2007  ? Atrial fibrillation ([place]) 04/06/2007  ? ? ?REFERRING DIAG: R32,R15.9 (ICD-10-CM) - Urinary and fecal incontinence ? ?THERAPY DIAG:  ?Other muscle spasm ? ?Muscle weakness (generalized) ? ?Unspecified  lack of coordination ? ?PERTINENT HISTORY: Laminectomy L2-L5 09/23/2021, inguinal hernia repair Rt 2020, RALP 09/15/2011 ? ?PRECAUTIONS: NA ? ?SUBJECTIVE: Pt states that he continues to feel fine after the morning. He has bowel movement or two in the morning. He may or may not have some amount of leaking between these bowel movements. He states hemorrhoids are still tender and will bleed some. There are days in which he does not have any incontinence. He reports no difference with nocturnal enuresis/nocturia.  ? ?PAIN:  ?Are you having  pain? No ? ? ?SUBJECTIVE STATEMENT: ?Pt states that he had low back surgery on 09/23/21 and has had fecal and urinary incontinence ever since. He states that he has been told a lot of this may be due to surgery. He denies any issues with fecal or urinary incontinence prior to surgery. He denies low back pain other than soreness. He reports no issues with fecal incontinence at night, but wakes frequently to urinate. However, he is unable to urinate throughout the day - he will go stand at toilet and not be able to go, but urine drips out throughout the day without sensation. He states that he is unsure of how many bowel movements he has throughout the day because there is so much leaking and inconsistency. He reports eating as healthy of a diet as possible with lots of fruits and veggies.   ?  ?Fluid intake: very little fluid intake ?Patient confirms identification and approves PT to assess pelvic floor and treatment No ?  ?  ?PAIN:  ?Are you having pain? No ?  ?PRECAUTIONS: None ?  ?WEIGHT BEARING RESTRICTIONS No ?  ?FALLS:  ?Has patient fallen in last 6 months? No, Number of falls: 0 ?  ?LIVING ENVIRONMENT: ?Lives with: lives with their family ?Lives in: House/apartment ?  ?OCCUPATION: Retired ?  ?PLOF: Independent ?  ?PATIENT GOALS To stop urinary/fecal incontinence ?  ?PERTINENT HISTORY:  ?Laminectomy L2-L5 09/23/2021, inguinal hernia repair Rt 2020, RALP 09/15/2011 ?Sexual abuse: No ?  ?BOWEL MOVEMENT ?Pain with bowel movement: No ?Type of bowel movement:Type (Bristol Stool Scale) 1-7, sometimes a combination all at the same time, Frequency very hard to answer- throughout the day, and Strain No - aggravated hemorrhoid ?Fully empty rectum: No ?Leakage: Yes: see above ?Pads: Yes: briefs ?Fiber supplement: No ?Patient is sleeping through the night without bowel issues ?  ?URINATION ?Pain with urination: No ?Fully empty bladder: No ?Stream:  diffiuclt to start during the day ?Urgency: No ?Frequency: during the day, no  control at all - 4x/night without difficulty ?Leakage:  leaking throughout the day, but no sensation of leaking ?Pads: Yes: briefs ?  ?INTERCOURSE ?Pain with intercourse:  none ?Climax: NA ?Ejaculation: No ?  ?  ?  ?  ?OBJECTIVE:  ?  ?DIAGNOSTIC FINDINGS:  ?MRI performed after surgery with no significant findings to help explain incontinence ?  ?COGNITION: ?           Overall cognitive status: Within functional limits for tasks assessed              ?            ?  ?  ?FUNCTIONAL TESTS: unable to perform sit<>stand without bil assist with Rt anterior hip pain.  ?  ?  ?GAIT: Rt LE antalgic gait pattern with reduced gait speed ?  ?POSTURE:  ?Posterior pelvic tilt, unable to reach neutral lumbar extension in standing ?  ?LUMBARAROM/PROM ?  ?A/PROM A/PROM  ?10/23/2021  ?Flexion  75%  ?Extension 0% - remains in small amount of lumbar flexion  ?Right lateral flexion 25% - pain in Rt groin  ?Left lateral flexion 75%  ?Right rotation 25%  ?Left rotation 50%  ? (Blank rows = not tested) ?  ?  ?LE MMT: ?  ?MMT Right ?10/23/2021 Left ?10/23/2021  ?Hip flexion 4-/5 4-/5  ?Hip extension      ?Hip abduction WNL WNL  ?Hip adduction WNL WNL  ?Hip internal rotation WNL, pain in Rt groin WNL  ?Hip external rotation WNL WNL  ?Knee flexion      ?Knee extension      ?Ankle dorsiflexion      ?Ankle plantarflexion      ?Ankle inversion      ?Ankle eversion      ?  ?PELVIC MMT: patient deferred pelvic floor examination at this time due to hemorrhoid being very uncomfortable and having had pelvic floor rectal examination yesterday ?  ?MMT   ?10/23/2021  ?Internal Anal Sphincter    ?External Anal Sphincter    ?Puborectalis    ?Diastasis Recti    ?(Blank rows = not tested) ?  ?PALPATION: ?GENERAL significant abdominal restriction, reduced rub mobility ?  ?            External Perineal Exam Pt consented to external assessment of pelvic floor contraction over clothing; he performs pelvic floor contraction with abdomen and glutes, not coordinating  appropriate contraction. With multimodal cues, he was able to improve isolation of pelvic floor contraction and decrease abdominal activation ?  ?            Internal Pelvic Floor Not performed this treatme

## 2021-12-04 NOTE — Patient Instructions (Signed)
From the time you get up until you go to bed, I want you to try and go to the bathroom every 2 hours whether you need to or not.  ? ?Stop fluid intake 2-3 hours before bed.  ? ?When you wake up with the urge to urinate at night, try to perform 5 pelvic floor contractions quickly, take a few deep breaths, distract yourself, and attempt to go back to sleep. Repeat as needed each time you get the urge to get up and urinate.  ? ?You may not be able to go back to sleep, but the goal is that soon you will be able to. Even if you only get 10-20 minutes at first, that's something and will hopefully turn into dropping a trip to the bathroom at night. ? ?Try not to worry about the night time leaking at this point (we'll get to that later) - let's focus on cutting down on trips to the bathroom at first.  ? ?Adding in fiber: get Metamucil or another brand that has psyllium husk in it. Follow the instructions on the bottle, but stick to the conservative dose.  ?

## 2021-12-04 NOTE — Patient Instructions (Signed)
If you are age 81 or older, your body mass index should be between 23-30. Your Body mass index is 25.12 kg/m?Marland Kitchen If this is out of the aforementioned range listed, please consider follow up with your Primary Care Provider. ? ?If you are age 39 or younger, your body mass index should be between 19-25. Your Body mass index is 25.12 kg/m?Marland Kitchen If this is out of the aformentioned range listed, please consider follow up with your Primary Care Provider.  ? ?________________________________________________________ ? ?The [facility] providers would like to encourage you to use [facility] to communicate with providers for non-urgent requests or questions.  Due to long hold times on the telephone, sending your provider a message by [facility] may be a faster and more efficient way to get a response.  Please allow 48 business hours for a response.  Please remember that this is for non-urgent requests.  ?_______________________________________________________ ? ? ?I appreciate the opportunity to care for you. ?Silvano Rusk, MD, [facility] ?

## 2021-12-04 NOTE — Progress Notes (Signed)
? ?Elijah [name] 81 y.o. 06/29/1941 [id number] ? ?Assessment & Plan:  ? ?Encounter Diagnoses  ?Name Primary?  ? Urinary and fecal incontinence Yes  ? Other irritable bowel syndrome   ? ? ?He is significantly improved.  Continue with PT of pelvic floor, Dr. Jerrye [name] assistance is much appreciated.  Between that and time this should resolve.  It does sound like he has a background of IBS issues which are contributing and of course urinary incontinence was present related to prior prostatectomy before all of this.  He will see me as needed. ? ? ? ?CC: Elijah Olp, MD ?Dr. Kary [name] ? ?Subjective:  ? ?Chief Complaint: Follow-up of incontinence ? ?HPI ?Elijah [name] returns reporting that he is better, he has some issues with fecal incontinence and leakage of small amounts of stool or mucus in the a.m. only.  The rest of the day he is okay and he can leave the house.  He has had 2 sessions with pelvic PT with Dr. Janne [name].  He has another one today.  He is also working with a Physiological scientist at [employer] and regaining general strength as well.  He said he did not realize how weak he was. ? ?In talking to him today it sounds like he always had some issues with frequent bowel movements in the morning.  Even before all of this it turns out he did occasionally have a small smear of stool in the pads he would wear due to urinary incontinence at times.  At any rate he only has some problems with multiple bowel movements and some mornings.  For example today he had 2 solid bowel movements and has been fine.  Though he remains concerned he is relieved with the improvement. ?Allergies  ?Allergen Reactions  ? Ciprofloxacin Nausea Only  ? Flagyl [Metronidazole] Nausea Only  ? ?Current Meds  ?Medication Sig  ? acetaminophen (TYLENOL) 650 MG CR tablet Take 650 mg by mouth 2 (two) times daily.  ? cholecalciferol (VITAMIN D3) 25 MCG (1000 UT) tablet Take 1,000 Units by mouth 2 (two) times daily.  ? famotidine (PEPCID) 20 MG tablet TAKE 1  TABLET(20 MG) BY MOUTH TWICE DAILY  ? hydrocortisone (ANUSOL-HC) 2.5 % rectal cream Place 1 application. rectally 2 (two) times daily.  ? metoprolol succinate (TOPROL-XL) 25 MG 24 hr tablet TAKE 1 TABLET BY MOUTH DAILY WITH OR IMMEDIATELY FOLLOWING A MEAL  ? Multiple Vitamin (MULTIVITAMIN WITH MINERALS) TABS tablet Take 1 tablet by mouth daily.  ? simvastatin (ZOCOR) 20 MG tablet TAKE 1 TABLET BY MOUTH EVERY DAY  ? XARELTO 20 MG TABS tablet TAKE 1 TABLET(20 MG) BY MOUTH DAILY  ? ?Past Medical History:  ?Diagnosis Date  ? Adenomatous colon polyp   ? Arthritis 09/10/2011  ? rt. hip, knee, hands  ? Cancer ([place]) 09/10/2011  ? dx. Prostate cancer-bx. done 12'12, surgery planned  ? Cataract   ? CKD (chronic kidney disease)   ? stage III (09/01/18)  ? Diverticulosis   ? Dysrhythmia   ? GERD (gastroesophageal reflux disease)   ? Hemorrhoid   ? Hx of adenomatous colonic polyps 06/05/2011  ? Hyperlipidemia   ? Hypertension   ? 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 bilateral ? etiology  ? Peripheral neuropathy   ? Permanent atrial fibrillation ([place]) 09/10/2011  ? Pre-diabetes   ? Prostate cancer ([place])   ? Stroke [facility])   ? ?Past Surgical History:  ?Procedure Laterality Date  ? COLONOSCOPY  04/2003, 06/05/2011  ? diverticulosis,  internal and external hemorrhoids 2004 and 2012, 2 small polyps 2012  ? EYE SURGERY  09/10/2011  ? Only stitches to close cut, and observation of hematoma from injury, cataracts bilateral  ? INGUINAL HERNIA REPAIR Right 06/16/2019  ? Procedure: OPEN RIGHT INGUINAL HERNIA REPAIR;  Surgeon: Alphonsa Overall, MD;  Location: [place];  Service: General;  Laterality: Right;  OPEN RIGHT INGUINAL HERNIA REPAIR  ? LUMBAR LAMINECTOMY/DECOMPRESSION MICRODISCECTOMY N/A 09/23/2021  ? Procedure: Laminectomy and Foraminotomy - Lumbar Two- Lumbar Three - Lumbar Three- Lumbar Four - Lumbar Four- Lumbar Five;  Surgeon: Elijah Kos, MD;  Location: [place];  Service: Neurosurgery;  Laterality: N/A;  Laminectomy and Foraminotomy - Lumbar  Two- Lumbar Three - Lumbar Three- Lumbar Four - Lumbar Four- Lumbar Five  ? ROBOT ASSISTED LAPAROSCOPIC RADICAL PROSTATECTOMY  09/15/2011  ? Procedure: ROBOTIC ASSISTED LAPAROSCOPIC RADICAL PROSTATECTOMY LEVEL 2;  Surgeon: Dutch Gray, MD;  [facility];  Service: Urology;  Laterality: N/A;   ? ? ? ?  ? TONSILLECTOMY    ? ?Social History  ? ?Social History Narrative  ? Married with 3 kids. 6 grandkids.   ?   ? 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   ?   ? Hobbies: beach (place at [employer]) usually every 2 weeks.   ? ?family history includes Alzheimer's disease in his paternal grandmother; Diabetes in his mother; Diverticulosis in his mother; Heart attack in his paternal grandfather; Heart attack (age of onset: 54) in his father; Hypertension in his mother; Prostate cancer in his brother. ? ? ?Review of Systems ?As per HPI ? ?Objective:  ? Physical Exam ?BP 130/70   Pulse 64   Ht '6\' 3"'$  (1.905 m)   Wt 201 lb (91.2 kg)   BMI 25.12 kg/m?  ? ? ?

## 2021-12-10 DIAGNOSIS — D225 Melanocytic nevi of trunk: Secondary | ICD-10-CM | POA: Diagnosis not present

## 2021-12-10 DIAGNOSIS — Z1283 Encounter for screening for malignant neoplasm of skin: Secondary | ICD-10-CM | POA: Diagnosis not present

## 2021-12-10 DIAGNOSIS — D485 Neoplasm of uncertain behavior of skin: Secondary | ICD-10-CM | POA: Diagnosis not present

## 2021-12-10 DIAGNOSIS — Z08 Encounter for follow-up examination after completed treatment for malignant neoplasm: Secondary | ICD-10-CM | POA: Diagnosis not present

## 2021-12-10 DIAGNOSIS — Z85820 Personal history of malignant melanoma of skin: Secondary | ICD-10-CM | POA: Diagnosis not present

## 2021-12-11 ENCOUNTER — Ambulatory Visit: Payer: Medicare Other

## 2021-12-11 DIAGNOSIS — M62838 Other muscle spasm: Secondary | ICD-10-CM

## 2021-12-11 DIAGNOSIS — M6281 Muscle weakness (generalized): Secondary | ICD-10-CM | POA: Diagnosis not present

## 2021-12-11 DIAGNOSIS — R279 Unspecified lack of coordination: Secondary | ICD-10-CM

## 2021-12-11 NOTE — Therapy (Signed)
?OUTPATIENT PHYSICAL THERAPY TREATMENT NOTE ? ? ?Patient Name: Elijah [name] ?MRN: [id number] ?DOB:07/07/1941, 81 y.o., male ?Today's Date: 12/11/2021 ? ?PCP: [name], MD ?REFERRING PROVIDER: Gatha Mayer, MD ? ?END OF SESSION:  ? PT End of Session - 12/11/21 1447   ? ? Visit Number 4   ? Date for PT Re-Evaluation 01/01/22   ? Authorization Type Medicare   ? PT Start Time 3762   ? PT Stop Time 1525   ? PT Time Calculation (min) 40 min   ? Activity Tolerance Patient tolerated treatment well   ? Behavior During Therapy [facility] for tasks assessed/performed   ? ?  ?  ? ?  ? ? ? ? ?Past Medical History:  ?Diagnosis Date  ? Adenomatous colon polyp   ? Arthritis 09/10/2011  ? rt. hip, knee, hands  ? Cancer ([place]) 09/10/2011  ? dx. Prostate cancer-bx. done 12'12, surgery planned  ? Cataract   ? CKD (chronic kidney disease)   ? stage III (09/01/18)  ? Diverticulosis   ? Dysrhythmia   ? GERD (gastroesophageal reflux disease)   ? Hemorrhoid   ? Hx of adenomatous colonic polyps 06/05/2011  ? Hyperlipidemia   ? Hypertension   ? 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 bilateral ? etiology  ? Peripheral neuropathy   ? Permanent atrial fibrillation ([place]) 09/10/2011  ? Pre-diabetes   ? Prostate cancer ([place])   ? Stroke [facility])   ? ?Past Surgical History:  ?Procedure Laterality Date  ? COLONOSCOPY  04/2003, 06/05/2011  ? diverticulosis, internal and external hemorrhoids 2004 and 2012, 2 small polyps 2012  ? EYE SURGERY  09/10/2011  ? Only stitches to close cut, and observation of hematoma from injury, cataracts bilateral  ? INGUINAL HERNIA REPAIR Right 06/16/2019  ? Procedure: OPEN RIGHT INGUINAL HERNIA REPAIR;  Surgeon: Alphonsa Overall, MD;  Location: [place];  Service: General;  Laterality: Right;  OPEN RIGHT INGUINAL HERNIA REPAIR  ? LUMBAR LAMINECTOMY/DECOMPRESSION MICRODISCECTOMY N/A 09/23/2021  ? Procedure: Laminectomy and Foraminotomy - Lumbar Two- Lumbar Three - Lumbar Three- Lumbar Four - Lumbar Four- Lumbar Five;  Surgeon: Kary Kos,  MD;  Location: [place];  Service: Neurosurgery;  Laterality: N/A;  Laminectomy and Foraminotomy - Lumbar Two- Lumbar Three - Lumbar Three- Lumbar Four - Lumbar Four- Lumbar Five  ? ROBOT ASSISTED LAPAROSCOPIC RADICAL PROSTATECTOMY  09/15/2011  ? Procedure: ROBOTIC ASSISTED LAPAROSCOPIC RADICAL PROSTATECTOMY LEVEL 2;  Surgeon: Dutch Gray, MD;  [facility];  Service: Urology;  Laterality: N/A;   ? ? ? ?  ? TONSILLECTOMY    ? ?Patient Active Problem List  ? Diagnosis Date Noted  ? Urinary and fecal incontinence 10/14/2021  ? Spinal stenosis of lumbar region 09/23/2021  ? Double vision 12/14/2017  ? Gross hematuria 02/12/2017  ? Hyperglycemia 08/13/2016  ? History of CVA (cerebrovascular accident) 05/14/2016  ? Thyroid nodule   ? History of melanoma 12/04/2015  ? GERD (gastroesophageal reflux disease) 12/04/2015  ? CKD (chronic kidney disease), stage III ([place]) 12/07/2014  ? Osteoarthritis of left hip 08/09/2014  ? Actinic keratosis 06/05/2014  ? History of prostate cancer 09/23/2012  ? Hx of adenomatous colonic polyps 06/05/2011  ? 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  ? Essential hypertension 04/07/2007  ? Hyperlipidemia 04/06/2007  ? Atrial fibrillation ([place]) 04/06/2007  ? ? ?REFERRING DIAG: R32,R15.9 (ICD-10-CM) - Urinary and fecal incontinence ? ?THERAPY DIAG:  ?Other muscle spasm ? ?Muscle weakness (generalized) ? ?Unspecified lack of coordination ? ?PERTINENT HISTORY: Laminectomy L2-L5  09/23/2021, inguinal hernia repair Rt 2020, RALP 09/15/2011 ? ?PRECAUTIONS: NA ? ?SUBJECTIVE: Pt states that he has only had one episode of fecal incontinence. He states that he woke up two times last night and he felt like he had less leaking in pad when he woke up - he feels like this is a significant improvement. He has been trying to not drink 2-3 hours before bed. He is trying to work on regular exercise as well.  ? ?PAIN:  ?Are you having pain? No ? ? ?SUBJECTIVE STATEMENT: ?Pt states that he had low  back surgery on 09/23/21 and has had fecal and urinary incontinence ever since. He states that he has been told a lot of this may be due to surgery. He denies any issues with fecal or urinary incontinence prior to surgery. He denies low back pain other than soreness. He reports no issues with fecal incontinence at night, but wakes frequently to urinate. However, he is unable to urinate throughout the day - he will go stand at toilet and not be able to go, but urine drips out throughout the day without sensation. He states that he is unsure of how many bowel movements he has throughout the day because there is so much leaking and inconsistency. He reports eating as healthy of a diet as possible with lots of fruits and veggies.   ?  ?Fluid intake: very little fluid intake ?Patient confirms identification and approves PT to assess pelvic floor and treatment No ?  ?  ?PAIN:  ?Are you having pain? No ?  ?PRECAUTIONS: None ?  ?WEIGHT BEARING RESTRICTIONS No ?  ?FALLS:  ?Has patient fallen in last 6 months? No, Number of falls: 0 ?  ?LIVING ENVIRONMENT: ?Lives with: lives with their family ?Lives in: House/apartment ?  ?OCCUPATION: Retired ?  ?PLOF: Independent ?  ?PATIENT GOALS To stop urinary/fecal incontinence ?  ?PERTINENT HISTORY:  ?Laminectomy L2-L5 09/23/2021, inguinal hernia repair Rt 2020, RALP 09/15/2011 ?Sexual abuse: No ?  ?BOWEL MOVEMENT ?Pain with bowel movement: No ?Type of bowel movement:Type (Bristol Stool Scale) 1-7, sometimes a combination all at the same time, Frequency very hard to answer- throughout the day, and Strain No - aggravated hemorrhoid ?Fully empty rectum: No ?Leakage: Yes: see above ?Pads: Yes: briefs ?Fiber supplement: No ?Patient is sleeping through the night without bowel issues ?  ?URINATION ?Pain with urination: No ?Fully empty bladder: No ?Stream:  diffiuclt to start during the day ?Urgency: No ?Frequency: during the day, no control at all - 4x/night without difficulty ?Leakage:  leaking  throughout the day, but no sensation of leaking ?Pads: Yes: briefs ?  ?INTERCOURSE ?Pain with intercourse:  none ?Climax: NA ?Ejaculation: No ?  ?  ?  ?  ?OBJECTIVE:  ?  ?DIAGNOSTIC FINDINGS:  ?MRI performed after surgery with no significant findings to help explain incontinence ?  ?COGNITION: ?           Overall cognitive status: Within functional limits for tasks assessed              ?            ?  ?  ?FUNCTIONAL TESTS: unable to perform sit<>stand without bil assist with Rt anterior hip pain.  ?  ?  ?GAIT: Rt LE antalgic gait pattern with reduced gait speed ?  ?POSTURE:  ?Posterior pelvic tilt, unable to reach neutral lumbar extension in standing ?  ?LUMBARAROM/PROM ?  ?A/PROM A/PROM  ?10/23/2021  ?Flexion 75%  ?Extension 0% -  remains in small amount of lumbar flexion  ?Right lateral flexion 25% - pain in Rt groin  ?Left lateral flexion 75%  ?Right rotation 25%  ?Left rotation 50%  ? (Blank rows = not tested) ?  ?  ?LE MMT: ?  ?MMT Right ?10/23/2021 Left ?10/23/2021  ?Hip flexion 4-/5 4-/5  ?Hip extension      ?Hip abduction WNL WNL  ?Hip adduction WNL WNL  ?Hip internal rotation WNL, pain in Rt groin WNL  ?Hip external rotation WNL WNL  ?Knee flexion      ?Knee extension      ?Ankle dorsiflexion      ?Ankle plantarflexion      ?Ankle inversion      ?Ankle eversion      ?  ?PELVIC MMT: patient deferred pelvic floor examination at this time due to hemorrhoid being very uncomfortable and having had pelvic floor rectal examination yesterday ?  ?MMT   ?10/23/2021  ?Internal Anal Sphincter    ?External Anal Sphincter    ?Puborectalis    ?Diastasis Recti    ?(Blank rows = not tested) ?  ?PALPATION: ?GENERAL significant abdominal restriction, reduced rub mobility ?  ?            External Perineal Exam Pt consented to external assessment of pelvic floor contraction over clothing; he performs pelvic floor contraction with abdomen and glutes, not coordinating appropriate contraction. With multimodal cues, he was able to  improve isolation of pelvic floor contraction and decrease abdominal activation ?  ?            Internal Pelvic Floor Not performed this treatment session ?  ?TONE: ?NA ?  ?TODAY'S TREATMENT 12/11/21: ?Manual

## 2021-12-18 ENCOUNTER — Ambulatory Visit: Payer: Medicare Other

## 2021-12-18 DIAGNOSIS — R279 Unspecified lack of coordination: Secondary | ICD-10-CM | POA: Diagnosis not present

## 2021-12-18 DIAGNOSIS — M62838 Other muscle spasm: Secondary | ICD-10-CM | POA: Diagnosis not present

## 2021-12-18 DIAGNOSIS — M6281 Muscle weakness (generalized): Secondary | ICD-10-CM | POA: Diagnosis not present

## 2021-12-18 NOTE — Therapy (Signed)
?OUTPATIENT PHYSICAL THERAPY TREATMENT NOTE ? ? ?Patient Name: Elijah [name] ?MRN: [id number] ?DOB:1941/05/11, 81 y.o., male ?Today's Date: 12/18/2021 ? ?PCP: [name], MD ?REFERRING PROVIDER: Gatha Mayer, MD ? ?END OF SESSION:  ? PT End of Session - 12/18/21 1444   ? ? Visit Number 5   ? Date for PT Re-Evaluation 01/01/22   ? Authorization Type Medicare   ? Progress Note Due on Visit 10   ? PT Start Time 0626   ? PT Stop Time 1525   ? PT Time Calculation (min) 40 min   ? Activity Tolerance Patient tolerated treatment well   ? Behavior During Therapy [facility] for tasks assessed/performed   ? ?  ?  ? ?  ? ? ? ? ? ?Past Medical History:  ?Diagnosis Date  ? Adenomatous colon polyp   ? Arthritis 09/10/2011  ? rt. hip, knee, hands  ? Cancer ([place]) 09/10/2011  ? dx. Prostate cancer-bx. done 12'12, surgery planned  ? Cataract   ? CKD (chronic kidney disease)   ? stage III (09/01/18)  ? Diverticulosis   ? Dysrhythmia   ? GERD (gastroesophageal reflux disease)   ? Hemorrhoid   ? Hx of adenomatous colonic polyps 06/05/2011  ? Hyperlipidemia   ? Hypertension   ? 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 bilateral ? etiology  ? Peripheral neuropathy   ? Permanent atrial fibrillation ([place]) 09/10/2011  ? Pre-diabetes   ? Prostate cancer ([place])   ? Stroke [facility])   ? ?Past Surgical History:  ?Procedure Laterality Date  ? COLONOSCOPY  04/2003, 06/05/2011  ? diverticulosis, internal and external hemorrhoids 2004 and 2012, 2 small polyps 2012  ? EYE SURGERY  09/10/2011  ? Only stitches to close cut, and observation of hematoma from injury, cataracts bilateral  ? INGUINAL HERNIA REPAIR Right 06/16/2019  ? Procedure: OPEN RIGHT INGUINAL HERNIA REPAIR;  Surgeon: Alphonsa Overall, MD;  Location: [place];  Service: General;  Laterality: Right;  OPEN RIGHT INGUINAL HERNIA REPAIR  ? LUMBAR LAMINECTOMY/DECOMPRESSION MICRODISCECTOMY N/A 09/23/2021  ? Procedure: Laminectomy and Foraminotomy - Lumbar Two- Lumbar Three - Lumbar Three- Lumbar Four - Lumbar Four-  Lumbar Five;  Surgeon: Kary Kos, MD;  Location: [place];  Service: Neurosurgery;  Laterality: N/A;  Laminectomy and Foraminotomy - Lumbar Two- Lumbar Three - Lumbar Three- Lumbar Four - Lumbar Four- Lumbar Five  ? ROBOT ASSISTED LAPAROSCOPIC RADICAL PROSTATECTOMY  09/15/2011  ? Procedure: ROBOTIC ASSISTED LAPAROSCOPIC RADICAL PROSTATECTOMY LEVEL 2;  Surgeon: Dutch Gray, MD;  Location: [facility];  Service: Urology;  Laterality: N/A;   ? ? ? ?  ? TONSILLECTOMY    ? ?Patient Active Problem List  ? Diagnosis Date Noted  ? Urinary and fecal incontinence 10/14/2021  ? Spinal stenosis of lumbar region 09/23/2021  ? Double vision 12/14/2017  ? Gross hematuria 02/12/2017  ? Hyperglycemia 08/13/2016  ? History of CVA (cerebrovascular accident) 05/14/2016  ? Thyroid nodule   ? History of melanoma 12/04/2015  ? GERD (gastroesophageal reflux disease) 12/04/2015  ? CKD (chronic kidney disease), stage III ([place]) 12/07/2014  ? Osteoarthritis of left hip 08/09/2014  ? Actinic keratosis 06/05/2014  ? History of prostate cancer 09/23/2012  ? Hx of adenomatous colonic polyps 06/05/2011  ? 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  ? Essential hypertension 04/07/2007  ? Hyperlipidemia 04/06/2007  ? Atrial fibrillation ([place]) 04/06/2007  ? ? ?REFERRING DIAG: R32,R15.9 (ICD-10-CM) - Urinary and fecal incontinence ? ?THERAPY DIAG:  ?Other muscle spasm ? ?Muscle weakness (generalized) ? ?  Unspecified lack of coordination ? ?PERTINENT HISTORY: Laminectomy L2-L5 09/23/2021, inguinal hernia repair Rt 2020, RALP 09/15/2011 ? ?PRECAUTIONS: NA ? ?SUBJECTIVE: Patient states that periods between voids during the night seem to be getting slightly longer, 3-4 hours, with only 2  trips to the bathroom at night. He is having much more Rt hip/LE pain that is making it hard to stay asleep at night.  ? ? ?PAIN:  ?Are you having pain? No ? ? ?SUBJECTIVE STATEMENT: ?Pt states that he had low back surgery on 09/23/21 and has had fecal and urinary  incontinence ever since. He states that he has been told a lot of this may be due to surgery. He denies any issues with fecal or urinary incontinence prior to surgery. He denies low back pain other than soreness. He reports no issues with fecal incontinence at night, but wakes frequently to urinate. However, he is unable to urinate throughout the day - he will go stand at toilet and not be able to go, but urine drips out throughout the day without sensation. He states that he is unsure of how many bowel movements he has throughout the day because there is so much leaking and inconsistency. He reports eating as healthy of a diet as possible with lots of fruits and veggies.   ?  ?Fluid intake: very little fluid intake ?Patient confirms identification and approves PT to assess pelvic floor and treatment No ?  ?  ?PAIN:  ?Are you having pain? No ?  ?PRECAUTIONS: None ?  ?WEIGHT BEARING RESTRICTIONS No ?  ?FALLS:  ?Has patient fallen in last 6 months? No, Number of falls: 0 ?  ?LIVING ENVIRONMENT: ?Lives with: lives with their family ?Lives in: House/apartment ?  ?OCCUPATION: Retired ?  ?PLOF: Independent ?  ?PATIENT GOALS To stop urinary/fecal incontinence ?  ?PERTINENT HISTORY:  ?Laminectomy L2-L5 09/23/2021, inguinal hernia repair Rt 2020, RALP 09/15/2011 ?Sexual abuse: No ?  ?BOWEL MOVEMENT ?Pain with bowel movement: No ?Type of bowel movement:Type (Bristol Stool Scale) 1-7, sometimes a combination all at the same time, Frequency very hard to answer- throughout the day, and Strain No - aggravated hemorrhoid ?Fully empty rectum: No ?Leakage: Yes: see above ?Pads: Yes: briefs ?Fiber supplement: No ?Patient is sleeping through the night without bowel issues ?  ?URINATION ?Pain with urination: No ?Fully empty bladder: No ?Stream:  diffiuclt to start during the day ?Urgency: No ?Frequency: during the day, no control at all - 4x/night without difficulty ?Leakage:  leaking throughout the day, but no sensation of  leaking ?Pads: Yes: briefs ?  ?INTERCOURSE ?Pain with intercourse:  none ?Climax: NA ?Ejaculation: No ?  ?  ?  ?  ?OBJECTIVE:  ?  ?DIAGNOSTIC FINDINGS:  ?MRI performed after surgery with no significant findings to help explain incontinence ?  ?COGNITION: ?           Overall cognitive status: Within functional limits for tasks assessed              ?            ?  ?  ?FUNCTIONAL TESTS: unable to perform sit<>stand without bil assist with Rt anterior hip pain.  ?  ?  ?GAIT: Rt LE antalgic gait pattern with reduced gait speed ?  ?POSTURE:  ?Posterior pelvic tilt, unable to reach neutral lumbar extension in standing ?  ?LUMBARAROM/PROM ?  ?A/PROM A/PROM  ?10/23/2021  ?Flexion 75%  ?Extension 0% - remains in small amount of lumbar flexion  ?Right lateral  flexion 25% - pain in Rt groin  ?Left lateral flexion 75%  ?Right rotation 25%  ?Left rotation 50%  ? (Blank rows = not tested) ?  ?  ?LE MMT: ?  ?MMT Right ?10/23/2021 Left ?10/23/2021  ?Hip flexion 4-/5 4-/5  ?Hip extension      ?Hip abduction WNL WNL  ?Hip adduction WNL WNL  ?Hip internal rotation WNL, pain in Rt groin WNL  ?Hip external rotation WNL WNL  ?Knee flexion      ?Knee extension      ?Ankle dorsiflexion      ?Ankle plantarflexion      ?Ankle inversion      ?Ankle eversion      ?  ?PELVIC MMT: patient deferred pelvic floor examination at this time due to hemorrhoid being very uncomfortable and having had pelvic floor rectal examination yesterday ?  ?MMT   ?10/23/2021  ?Internal Anal Sphincter    ?External Anal Sphincter    ?Puborectalis    ?Diastasis Recti    ?(Blank rows = not tested) ?  ?PALPATION: ?GENERAL significant abdominal restriction, reduced rub mobility ?  ?            External Perineal Exam Pt consented to external assessment of pelvic floor contraction over clothing; he performs pelvic floor contraction with abdomen and glutes, not coordinating appropriate contraction. With multimodal cues, he was able to improve isolation of pelvic floor  contraction and decrease abdominal activation ?  ?            Internal Pelvic Floor Not performed this treatment session ?  ?TONE: ?NA ?  ?TODAY'S TREATMENT 12/18/21: ?Manual: ?Soft tissue mobilization: ?Rt glutes/piriformis/TF

## 2021-12-25 ENCOUNTER — Ambulatory Visit: Payer: Medicare Other

## 2021-12-25 DIAGNOSIS — R279 Unspecified lack of coordination: Secondary | ICD-10-CM | POA: Diagnosis not present

## 2021-12-25 DIAGNOSIS — M6281 Muscle weakness (generalized): Secondary | ICD-10-CM

## 2021-12-25 DIAGNOSIS — M62838 Other muscle spasm: Secondary | ICD-10-CM

## 2021-12-25 NOTE — Therapy (Signed)
OUTPATIENT PHYSICAL THERAPY TREATMENT NOTE   Patient Name: Elijah [name] MRN: [id number] DOB:09/27/1940, 81 y.o., male Today's Date: 12/25/2021  PCP: [name], MD REFERRING PROVIDER: Gatha Mayer, MD  END OF SESSION:   PT End of Session - 12/25/21 1444     Visit Number 6    Date for PT Re-Evaluation 01/01/22    Authorization Type Medicare    Progress Note Due on Visit 10    PT Start Time 1445    PT Stop Time 1530    PT Time Calculation (min) 45 min    Activity Tolerance Patient tolerated treatment well                 Past Medical History:  Diagnosis Date   Adenomatous colon polyp    Arthritis 09/10/2011   rt. hip, knee, hands   Cancer ([place]) 09/10/2011   dx. Prostate cancer-bx. 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   Peripheral neuropathy    Permanent atrial fibrillation ([place]) 09/10/2011   Pre-diabetes    Prostate cancer ([place])    Stroke [facility])    Past Surgical History:  Procedure Laterality Date   COLONOSCOPY  04/2003, 06/05/2011   diverticulosis, internal and external hemorrhoids 2004 and 2012, 2 small polyps 2012   EYE SURGERY  09/10/2011   Only stitches to close cut, and observation of hematoma from injury, cataracts bilateral   INGUINAL HERNIA REPAIR Right 06/16/2019   Procedure: OPEN RIGHT INGUINAL HERNIA REPAIR;  Surgeon: Alphonsa Overall, MD;  Location: [place];  Service: General;  Laterality: Right;  OPEN RIGHT INGUINAL HERNIA REPAIR   LUMBAR LAMINECTOMY/DECOMPRESSION MICRODISCECTOMY N/A 09/23/2021   Procedure: Laminectomy and Foraminotomy - Lumbar Two- Lumbar Three - Lumbar Three- Lumbar Four - Lumbar Four- Lumbar Five;  Surgeon: Kary Kos, MD;  Location: [place];   Service: Neurosurgery;  Laterality: N/A;  Laminectomy and Foraminotomy - Lumbar Two- Lumbar Three - Lumbar Three- Lumbar Four - Lumbar Four- Lumbar Five   ROBOT ASSISTED LAPAROSCOPIC RADICAL PROSTATECTOMY  09/15/2011   Procedure: ROBOTIC ASSISTED LAPAROSCOPIC RADICAL PROSTATECTOMY LEVEL 2;  Surgeon: Dutch Gray, MD;  [facility];  Service: Urology;  Laterality: N/A;         TONSILLECTOMY     Patient Active Problem List   Diagnosis Date Noted   Urinary and fecal incontinence 10/14/2021   Spinal stenosis of lumbar region 09/23/2021   Double vision 12/14/2017   Gross hematuria 02/12/2017   Hyperglycemia 08/13/2016   History of CVA (cerebrovascular accident) 05/14/2016   Thyroid nodule    History of melanoma 12/04/2015   GERD (gastroesophageal reflux disease) 12/04/2015   CKD (chronic kidney disease), stage III ([place]) 12/07/2014   Osteoarthritis of left hip 08/09/2014   Actinic keratosis 06/05/2014   History of prostate cancer 09/23/2012   Hx of adenomatous colonic polyps 06/05/2011   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   Essential hypertension 04/07/2007   Hyperlipidemia 04/06/2007   Atrial fibrillation ([place]) 04/06/2007    REFERRING DIAG: R32,R15.9 (ICD-10-CM) - Urinary and fecal incontinence  THERAPY DIAG:  Other muscle spasm  Muscle weakness (generalized)  Unspecified lack of coordination  PERTINENT HISTORY: Laminectomy  L2-L5 09/23/2021, inguinal hernia repair Rt 2020, RALP 09/15/2011  PRECAUTIONS: NA  SUBJECTIVE: Patient states that he had two bowel movements, which is his normal, and no leaking. He feels like the leaking happens when there is no warning and he cannot make it to the toilet in time. He has not added fiber supplement into diet. Patient states that nocturia/night enuresis is getting better. He only woke up once last night and he is feeling less wetness in pads in the morning. He performs pelvic floor contractions 3x/day religiously. He  states that he is having a lot of Rt hip pain the last couple of days   PAIN:  Are you having pain?   SUBJECTIVE STATEMENT: Pt states that he had low back surgery on 09/23/21 and has had fecal and urinary incontinence ever since. He states that he has been told a lot of this may be due to surgery. He denies any issues with fecal or urinary incontinence prior to surgery. He denies low back pain other than soreness. He reports no issues with fecal incontinence at night, but wakes frequently to urinate. However, he is unable to urinate throughout the day - he will go stand at toilet and not be able to go, but urine drips out throughout the day without sensation. He states that he is unsure of how many bowel movements he has throughout the day because there is so much leaking and inconsistency. He reports eating as healthy of a diet as possible with lots of fruits and veggies.     Fluid intake: very little fluid intake Patient confirms identification and approves PT to assess pelvic floor and treatment No     PAIN:  Are you having pain? No   PRECAUTIONS: None   WEIGHT BEARING RESTRICTIONS No   FALLS:  Has patient fallen in last 6 months? No, Number of falls: 0   LIVING ENVIRONMENT: Lives with: lives with their family Lives in: House/apartment   OCCUPATION: Retired   PLOF: Independent   PATIENT GOALS To stop urinary/fecal incontinence   PERTINENT HISTORY:  Laminectomy L2-L5 09/23/2021, inguinal hernia repair Rt 2020, RALP 09/15/2011 Sexual abuse: No   BOWEL MOVEMENT Pain with bowel movement: No Type of bowel movement:Type (Bristol Stool Scale) 1-7, sometimes a combination all at the same time, Frequency very hard to answer- throughout the day, and Strain No - aggravated hemorrhoid Fully empty rectum: No Leakage: Yes: see above Pads: Yes: briefs Fiber supplement: No Patient is sleeping through the night without bowel issues   URINATION Pain with urination: No Fully empty  bladder: No Stream:  diffiuclt to start during the day Urgency: No Frequency: during the day, no control at all - 4x/night without difficulty Leakage:  leaking throughout the day, but no sensation of leaking Pads: Yes: briefs   INTERCOURSE Pain with intercourse:  none Climax: NA Ejaculation: No         OBJECTIVE:    DIAGNOSTIC FINDINGS:  MRI performed after surgery with no significant findings to help explain incontinence   COGNITION:            Overall cognitive status: Within functional limits for tasks assessed                              FUNCTIONAL TESTS: unable to perform sit<>stand without bil assist with Rt anterior hip pain.      GAIT: Rt LE antalgic gait pattern with reduced gait speed  POSTURE:  Posterior pelvic tilt, unable to reach neutral lumbar extension in standing   LUMBARAROM/PROM   A/PROM A/PROM  10/23/2021  Flexion 75%  Extension 0% - remains in small amount of lumbar flexion  Right lateral flexion 25% - pain in Rt groin  Left lateral flexion 75%  Right rotation 25%  Left rotation 50%   (Blank rows = not tested)     LE MMT:   MMT Right 10/23/2021 Left 10/23/2021  Hip flexion 4-/5 4-/5  Hip extension      Hip abduction WNL WNL  Hip adduction WNL WNL  Hip internal rotation WNL, pain in Rt groin WNL  Hip external rotation WNL WNL  Knee flexion      Knee extension      Ankle dorsiflexion      Ankle plantarflexion      Ankle inversion      Ankle eversion        PELVIC MMT: patient deferred pelvic floor examination at this time due to hemorrhoid being very uncomfortable and having had pelvic floor rectal examination yesterday   MMT   10/23/2021  Internal Anal Sphincter    External Anal Sphincter    Puborectalis    Diastasis Recti    (Blank rows = not tested)   PALPATION: GENERAL significant abdominal restriction, reduced rub mobility               External Perineal Exam Pt consented to external assessment of pelvic floor  contraction over clothing; he performs pelvic floor contraction with abdomen and glutes, not coordinating appropriate contraction. With multimodal cues, he was able to improve isolation of pelvic floor contraction and decrease abdominal activation               Internal Pelvic Floor Not performed this treatment session   TONE: NA   TODAY'S TREATMENT 12/25/21 Manual: Soft tissue mobilization: Anterior hip/thigh/groin - use of massage tool Dry needling: Trigger Point Dry-Needling  Treatment instructions: Expect mild to moderate muscle soreness. S/S of pneumothorax if dry needled over a lung field, and to seek immediate medical attention should they occur. Patient verbalized understanding of these instructions and education.  Patient Consent Given: Yes Education handout provided: Yes Muscles treated: Rt adductors, pectineus, iliopsoas Electrical stimulation performed: No Parameters: N/A Treatment response/outcome: twist response, improved tissue restriction Self-care: Fiber supplement - purpose of adding bulk to bowel movements and benefit to completely emptying Using urge suppression technique to attempt cutting out 3rd/4th bowel movements in the morning to help improve bulk in bowel movements   TREATMENT 12/18/21: Manual: Soft tissue mobilization: Rt glutes/piriformis/TFL Scar tissue mobilization: Myofascial release: Spinal mobilization: Internal pelvic floor techniques: Dry needling: Verbal consent provided Rt glutes/piriformis/TFL Neuromuscular re-education: Core retraining:  Core facilitation: Form correction: Pelvic floor contraction training: Down training: Exercises: Stretches/mobility: Strengthening: Therapeutic activities: Functional strengthening activities: Self-care: Adding fiber supplement to diet with increased water Sticking to voiding schedule Using urge suppression technique for bowel movements   TREATMENT 12/11/21: Manual: Soft tissue  mobilization: Scar tissue mobilization: Myofascial release: Spinal mobilization: Internal pelvic floor techniques: Dry needling: Neuromuscular re-education: Core retraining:  Core facilitation: Resisted march with opposite hand 2 x 10 Form correction: Pelvic floor contraction training: Down training: Exercises: Stretches/mobility: Strengthening: Seated hip adduction 2 x 10 5 sec holds Seated hip abduction 2 x 10 5 sec holds Standing hip 3 way 10x ea, bil Therapeutic activities: Functional strengthening activities: Self-care: Extensive education on DN for Rt hip to help normalize muscle tone, improve benefit  of strengthening/stretching, and optimize pelvic floor     PATIENT EDUCATION:   Education details: See above self-care Person educated: Patient Education method: Explanation, Demonstration, Tactile cues, Verbal cues, and Handouts Education comprehension: verbalized understanding     HOME EXERCISE PROGRAM: [id number]   ASSESSMENT:   CLINICAL IMPRESSION: Patient is starting to see better progress with fecal incontinence and urinary incontinence. He is self-reporting progress, indicating greater improvements in condition as well. Believe that only waking once a night and trend towards less wetness in brief overnight is great outcome considering longevity of symptoms after prostate surgery. DN and manual techniques performed to anterior Rt hip today with good release of soft tissue restriction and improvement in pain with ambulation at end of session. Possibly consider continuing these techniques if patient is seeing good benefit; we did discuss relationship between hip and pelvic floor function again today. He was encouraged to use urge suppression technique to attempt cutting out 3rd bowel movement in the mornings to see if we can build more bulk. He will benefit from skilled PT intervention in order to address impairments, improve urinary/fecal incontinence, and improve QOL.       OBJECTIVE IMPAIRMENTS decreased activity tolerance, decreased coordination, decreased endurance, decreased mobility, difficulty walking, decreased strength, hypomobility, increased fascial restrictions, increased muscle spasms, impaired flexibility, postural dysfunction, and pain.    ACTIVITY LIMITATIONS community activity.    PERSONAL FACTORS 3+ comorbidities: RALP 2013, inguinal hernia repair 2020, laminectomy 09/23/2021  are also affecting patient's functional outcome.      REHAB POTENTIAL: Good   CLINICAL DECISION MAKING: Stable/uncomplicated   EVALUATION COMPLEXITY: Low     GOALS: Goals reviewed with patient? Yes   SHORT TERM GOALS: Target date: 11/20/2021   Pt will be independent with HEP.    Baseline:  Goal status: IN PROGRESS   2.  Pt will be independent with use of squatty potty, relaxed toileting mechanics, and improved bowel movement techniques in order to increase ease of bowel movements and complete evacuation.    Baseline:  Goal status: IN PROGRESS   3.  Pt will implement and be independent with bowel/bladder retraining program in order to improve urinary/fecal incontinence. Baseline:  Goal status:IN PROGRESS       LONG TERM GOALS: Target date: 01/01/2022   Pt will be independent with advanced HEP.    Baseline:  Goal status: IN PROGRESS   2.  Pt will decrease episodes of fecal incontinence to 1x/day or less in order to increase confidence with community activities and improve QOL. Baseline:  Goal status: IN PROGRESS   3.  Pt will decrease episodes of urinary incontinence to 1x/day or less and 1x or less trips to the bathroom at night in order to improve hygiene, confidence with community activities, restful sleep, and QOL.  Baseline:  Goal status: IN PROGRESS   4.  Pt will demonstrate 3/5 pelvic floor muscle strength with appropriate coordination in order to decrease urinary incontinence, urgency and frequency.   Baseline:  Goal status:IN PROGRESS        PLAN: PT FREQUENCY: 1x/week   PT DURATION: 10 weeks   PLANNED INTERVENTIONS: Therapeutic exercises, Therapeutic activity, Neuromuscular re-education, Balance training, Gait training, Patient/Family education, Joint mobilization, Dry Needling, Biofeedback, and Manual therapy   PLAN FOR NEXT SESSION: Continue manual techniques/DN to hips if beneficial; see if patient has added in fiber. Stretches for hips.    Heather Roberts, PT, DPT05/24/233:43 PM

## 2022-01-01 ENCOUNTER — Ambulatory Visit: Payer: Medicare Other

## 2022-01-01 DIAGNOSIS — R279 Unspecified lack of coordination: Secondary | ICD-10-CM

## 2022-01-01 DIAGNOSIS — M6281 Muscle weakness (generalized): Secondary | ICD-10-CM | POA: Diagnosis not present

## 2022-01-01 DIAGNOSIS — M62838 Other muscle spasm: Secondary | ICD-10-CM

## 2022-01-01 NOTE — Therapy (Signed)
OUTPATIENT PHYSICAL THERAPY TREATMENT NOTE   Patient Name: Elijah [name] MRN: [id number] DOB:1940/12/06, 81 y.o., male Today's Date: 01/01/2022  PCP: [name], MD REFERRING PROVIDER: Gatha Mayer, MD  END OF SESSION:   PT End of Session - 01/01/22 1443     Visit Number 7    Date for PT Re-Evaluation 01/01/22    Authorization Type Medicare    PT Start Time 1445    PT Stop Time 1525    PT Time Calculation (min) 40 min    Activity Tolerance Patient tolerated treatment well    Behavior During Therapy [facility] for tasks assessed/performed                  Past Medical History:  Diagnosis Date   Adenomatous colon polyp    Arthritis 09/10/2011   rt. hip, knee, hands   Cancer ([place]) 09/10/2011   dx. Prostate cancer-bx. 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   Peripheral neuropathy    Permanent atrial fibrillation ([place]) 09/10/2011   Pre-diabetes    Prostate cancer ([place])    Stroke [facility])    Past Surgical History:  Procedure Laterality Date   COLONOSCOPY  04/2003, 06/05/2011   diverticulosis, internal and external hemorrhoids 2004 and 2012, 2 small polyps 2012   EYE SURGERY  09/10/2011   Only stitches to close cut, and observation of hematoma from injury, cataracts bilateral   INGUINAL HERNIA REPAIR Right 06/16/2019   Procedure: OPEN RIGHT INGUINAL HERNIA REPAIR;  Surgeon: Alphonsa Overall, MD;  Location: [place];  Service: General;  Laterality: Right;  OPEN RIGHT INGUINAL HERNIA REPAIR   LUMBAR LAMINECTOMY/DECOMPRESSION MICRODISCECTOMY N/A 09/23/2021   Procedure: Laminectomy and Foraminotomy - Lumbar Two- Lumbar Three - Lumbar Three- Lumbar Four - Lumbar Four- Lumbar Five;  Surgeon: Kary Kos, MD;  Location: [place];  Service: Neurosurgery;  Laterality: N/A;  Laminectomy and Foraminotomy - Lumbar Two- Lumbar Three - Lumbar Three- Lumbar Four - Lumbar Four- Lumbar Five   ROBOT ASSISTED LAPAROSCOPIC RADICAL PROSTATECTOMY  09/15/2011   Procedure: ROBOTIC ASSISTED LAPAROSCOPIC RADICAL PROSTATECTOMY LEVEL 2;  Surgeon: Dutch Gray, MD;  Location: [facility];  Service: Urology;  Laterality: N/A;         TONSILLECTOMY     Patient Active Problem List   Diagnosis Date Noted   Urinary and fecal incontinence 10/14/2021   Spinal stenosis of lumbar region 09/23/2021   Double vision 12/14/2017   Gross hematuria 02/12/2017   Hyperglycemia 08/13/2016   History of CVA (cerebrovascular accident) 05/14/2016   Thyroid nodule    History of melanoma 12/04/2015   GERD (gastroesophageal reflux disease) 12/04/2015   CKD (chronic kidney disease), stage III ([place]) 12/07/2014   Osteoarthritis of left hip 08/09/2014   Actinic keratosis 06/05/2014   History of prostate cancer 09/23/2012   Hx of adenomatous colonic polyps 06/05/2011   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   Essential hypertension 04/07/2007   Hyperlipidemia 04/06/2007   Atrial fibrillation ([place]) 04/06/2007    REFERRING DIAG: R32,R15.9 (ICD-10-CM) - Urinary and fecal incontinence  THERAPY DIAG:  Other muscle spasm  Muscle weakness (generalized)  Unspecified lack of coordination  PERTINENT  HISTORY: Laminectomy L2-L5 09/23/2021, inguinal hernia repair Rt 2020, RALP 09/15/2011  PRECAUTIONS: NA  SUBJECTIVE: Patient states that he has started fiber supplement - he has been taking 2 fiber pills every morning for the last 5 days. He has not had any accidents in the last 5 days and only two bowel movements each morning. He reports variation in nightly urination symptoms. One night in the last week he got up 1x with hardly any wetness in his pad; last night he woke up 4x to urinate. He states that during the day last  night he had much more water and food that contained water.    PAIN:  Are you having pain?   SUBJECTIVE STATEMENT: Pt states that he had low back surgery on 09/23/21 and has had fecal and urinary incontinence ever since. He states that he has been told a lot of this may be due to surgery. He denies any issues with fecal or urinary incontinence prior to surgery. He denies low back pain other than soreness. He reports no issues with fecal incontinence at night, but wakes frequently to urinate. However, he is unable to urinate throughout the day - he will go stand at toilet and not be able to go, but urine drips out throughout the day without sensation. He states that he is unsure of how many bowel movements he has throughout the day because there is so much leaking and inconsistency. He reports eating as healthy of a diet as possible with lots of fruits and veggies.     Fluid intake: very little fluid intake Patient confirms identification and approves PT to assess pelvic floor and treatment No     PAIN:  Are you having pain? No   PRECAUTIONS: None   WEIGHT BEARING RESTRICTIONS No   FALLS:  Has patient fallen in last 6 months? No, Number of falls: 0   LIVING ENVIRONMENT: Lives with: lives with their family Lives in: House/apartment   OCCUPATION: Retired   PLOF: Independent   PATIENT GOALS To stop urinary/fecal incontinence   PERTINENT HISTORY:  Laminectomy L2-L5 09/23/2021, inguinal hernia repair Rt 2020, RALP 09/15/2011 Sexual abuse: No   BOWEL MOVEMENT Pain with bowel movement: No Type of bowel movement:Type (Bristol Stool Scale) 1-7, sometimes a combination all at the same time, Frequency very hard to answer- throughout the day, and Strain No - aggravated hemorrhoid Fully empty rectum: No Leakage: Yes: see above Pads: Yes: briefs Fiber supplement: No Patient is sleeping through the night without bowel issues   URINATION Pain with urination: No Fully empty bladder:  No Stream:  diffiuclt to start during the day Urgency: No Frequency: during the day, no control at all - 4x/night without difficulty Leakage:  leaking throughout the day, but no sensation of leaking Pads: Yes: briefs   INTERCOURSE Pain with intercourse:  none Climax: NA Ejaculation: No         OBJECTIVE 01/01/22: No new objective findings this treatment session   DIAGNOSTIC FINDINGS:  MRI performed after surgery with no significant findings to help explain incontinence   COGNITION:            Overall cognitive status: Within functional limits for tasks assessed                              FUNCTIONAL TESTS: unable to perform sit<>stand without bil assist with Rt anterior hip pain.      GAIT: Rt LE antalgic gait  pattern with reduced gait speed   POSTURE:  Posterior pelvic tilt, unable to reach neutral lumbar extension in standing   LUMBARAROM/PROM   A/PROM A/PROM  10/23/2021   Flexion 75%   Extension 0% - remains in small amount of lumbar flexion   Right lateral flexion 25% - pain in Rt groin   Left lateral flexion 75%   Right rotation 25%   Left rotation 50%    (Blank rows = not tested)     LE MMT:   MMT Right 10/23/2021 Left 10/23/2021  Hip flexion 4-/5 4-/5  Hip extension      Hip abduction WNL WNL  Hip adduction WNL WNL  Hip internal rotation WNL, pain in Rt groin WNL  Hip external rotation WNL WNL  Knee flexion      Knee extension      Ankle dorsiflexion      Ankle plantarflexion      Ankle inversion      Ankle eversion        PELVIC MMT: patient deferred pelvic floor examination at this time due to hemorrhoid being very uncomfortable and having had pelvic floor rectal examination yesterday   MMT   10/23/2021  Internal Anal Sphincter    External Anal Sphincter    Puborectalis    Diastasis Recti    (Blank rows = not tested)   PALPATION: GENERAL significant abdominal restriction, reduced rub mobility               External Perineal Exam Pt  consented to external assessment of pelvic floor contraction over clothing; he performs pelvic floor contraction with abdomen and glutes, not coordinating appropriate contraction. With multimodal cues, he was able to improve isolation of pelvic floor contraction and decrease abdominal activation               Internal Pelvic Floor Not performed this treatment session   TONE: NA   TODAY'S TREATMENT 01/01/22: Self-care: Reviewed water intake Discussed how to know if fiber is working and when to increase if he needs to Reviewed bladder retraining and urge suppression technique Discussed benefit from Ortho PT for Bil hip   TREATMENT 12/25/21 Manual: Soft tissue mobilization: Anterior hip/thigh/groin - use of massage tool Dry needling: Trigger Point Dry-Needling  Treatment instructions: Expect mild to moderate muscle soreness. S/S of pneumothorax if dry needled over a lung field, and to seek immediate medical attention should they occur. Patient verbalized understanding of these instructions and education.  Patient Consent Given: Yes Education handout provided: Yes Muscles treated: Rt adductors, pectineus, iliopsoas Electrical stimulation performed: No Parameters: N/A Treatment response/outcome: twist response, improved tissue restriction Self-care: Fiber supplement - purpose of adding bulk to bowel movements and benefit to completely emptying Using urge suppression technique to attempt cutting out 3rd/4th bowel movements in the morning to help improve bulk in bowel movements   TREATMENT 12/18/21: Manual: Soft tissue mobilization: Rt glutes/piriformis/TFL Scar tissue mobilization: Myofascial release: Spinal mobilization: Internal pelvic floor techniques: Dry needling: Verbal consent provided Rt glutes/piriformis/TFL Self-care: Adding fiber supplement to diet with increased water Sticking to voiding schedule Using urge suppression technique for bowel movements      PATIENT  EDUCATION:   Education details: See above self-care Person educated: Patient Education method: Explanation, Demonstration, Tactile cues, Verbal cues, and Handouts Education comprehension: verbalized understanding     HOME EXERCISE PROGRAM: [id number]   ASSESSMENT:   CLINICAL IMPRESSION: Patient is overall doing very well. His fecal incontinence has completely resolved and he has significantly  reduced the number of bowel movements he is having. Believe he can easily get down to 1x/day bowel movements with consistent use of ure suppression technique and continuing fiber. Urinary symptoms at night are still present, but improving; we discussed at length with 10 year history of symptoms, they may take time and dedication to lifestyle intervention in order to continue to see improvements, but believe that it is very possible. We continued to discuss benefit of PT specifically for Bil hips could potentially make drastic improvements on QOL/pain if he is not considering surgery and possibly also have good impact on pelvic floor symptoms. He will let us know if he would like to return for orthopedic physical therapy. Due to improvements and having necessary tools to continue progress, he is prepared to D/C skilled PT intervention at this time; he was encouraged to call with any questions/concerns.    OBJECTIVE IMPAIRMENTS decreased activity tolerance, decreased coordination, decreased endurance, decreased mobility, difficulty walking, decreased strength, hypomobility, increased fascial restrictions, increased muscle spasms, impaired flexibility, postural dysfunction, and pain.    ACTIVITY LIMITATIONS community activity.    PERSONAL FACTORS 3+ comorbidities: RALP 2013, inguinal hernia repair 2020, laminectomy 09/23/2021  are also affecting patient's functional outcome.      REHAB POTENTIAL: Good   CLINICAL DECISION MAKING: Stable/uncomplicated   EVALUATION COMPLEXITY: Low     GOALS: Goals reviewed  with patient? Yes   SHORT TERM GOALS: Target date: 11/20/2021   Pt will be independent with HEP.    Baseline:  Goal status: MET   2.  Pt will be independent with use of squatty potty, relaxed toileting mechanics, and improved bowel movement techniques in order to increase ease of bowel movements and complete evacuation.    Baseline:  Goal status: MET   3.  Pt will implement and be independent with bowel/bladder retraining program in order to improve urinary/fecal incontinence. Baseline:  Goal status: MET       LONG TERM GOALS: Target date: 01/01/2022   Pt will be independent with advanced HEP.    Baseline:  Goal status: MET   2.  Pt will decrease episodes of fecal incontinence to 1x/day or less in order to increase confidence with community activities and improve QOL. Baseline:  Goal status: MET   3.  Pt will decrease episodes of urinary incontinence to 1x/day or less and 1x or less trips to the bathroom at night in order to improve hygiene, confidence with community activities, restful sleep, and QOL.  Baseline:  Goal status: IN PROGRESS - making good improvements in nocturia and nocturnal enuresis   4.  Pt will demonstrate 3/5 pelvic floor muscle strength with appropriate coordination in order to decrease urinary incontinence, urgency and frequency.   Baseline:  Goal status:IN PROGRESS - not tested due to patient being uncomfortable with rectal assessment       PLAN:   PLAN FOR NEXT SESSION: D/C - patient will call if he wants to be treated for Bil hip pain.   PHYSICAL THERAPY DISCHARGE SUMMARY  Visits from Start of Care: 7  Current functional level related to goals / functional outcomes: Independent   Remaining deficits: See above   Education / Equipment: HEP   Patient agrees to discharge. Patient goals were partially met. Patient is being discharged due to  having met primary goals for being here and having tools to continue making progress towards  secondary goals.  Heather Roberts, PT, DPT05/31/233:34 PM

## 2022-01-20 ENCOUNTER — Ambulatory Visit (INDEPENDENT_AMBULATORY_CARE_PROVIDER_SITE_OTHER): Payer: Medicare Other | Admitting: Family Medicine

## 2022-01-20 VITALS — BP 138/62 | HR 87 | Temp 98.2°F | Resp 16 | Ht 75.0 in | Wt 199.2 lb

## 2022-01-20 DIAGNOSIS — R051 Acute cough: Secondary | ICD-10-CM

## 2022-01-20 DIAGNOSIS — J029 Acute pharyngitis, unspecified: Secondary | ICD-10-CM

## 2022-01-20 DIAGNOSIS — U071 COVID-19: Secondary | ICD-10-CM | POA: Diagnosis not present

## 2022-01-20 DIAGNOSIS — J3489 Other specified disorders of nose and nasal sinuses: Secondary | ICD-10-CM | POA: Diagnosis not present

## 2022-01-20 LAB — POC COVID19 BINAXNOW: SARS Coronavirus 2 Ag: POSITIVE — AB

## 2022-01-20 LAB — POCT RAPID STREP A (OFFICE): Rapid Strep A Screen: NEGATIVE

## 2022-01-20 MED ORDER — MOLNUPIRAVIR EUA 200MG CAPSULE: 4.0000 | ORAL_CAPSULE | Freq: Two times a day (BID) | ORAL | 0 refills | Status: AC

## 2022-01-20 MED ORDER — BENZONATATE 100 MG PO CAPS: 100.0000 mg | ORAL_CAPSULE | Freq: Three times a day (TID) | ORAL | 0 refills | Status: DC | PRN

## 2022-01-20 NOTE — Patient Instructions (Addendum)
Mucinex over the counter for cough, but tessalon also prescribed if needed.  Molnupiravir sent to your pharmacy.  Isolate through Wednesday, then mask around others for 5 additional days. See info below.  It may be best that your spouse or other sick contacts test for covid every 2 days temporarily.  Return to the clinic or go to the nearest emergency room if any of your symptoms worsen or new symptoms occur.  Information is provided from the CDC. There is a link provided below for more information if needed.   Everyone who has presumed or confirmed COVID-19 should stay home and isolate from other people for at least 5 full days (day 0 is the first day of symptoms or the date of the day of the positive viral test for asymptomatic persons). You can end isolation after 5 full days if you are fever-free for 24 hours without the use of fever-reducing medication and your other symptoms have improved (Loss of taste and smell may persist for weeks or months after recovery and need not delay the end of isolation). You should continue to wear a well-fitting mask around others at home and in public for 5 additional days (day 6 through day 10) after the end of your 5-day isolation period. If you are unable to wear a mask when around others, you should continue to isolate for a full 10 days. Avoid people who have weakened immune systems or are more likely to get very sick from COVID-19, and nursing homes and other high-risk settings, until after at least 10 days.  If you continue to have fever or your other symptoms have not improved after 5 days of isolation, you should wait to end your isolation until you are fever-free for 24 hours without the use of fever-reducing medication and your other symptoms have improved. Continue to wear a well-fitting mask through day 10.   [email or web address]

## 2022-01-20 NOTE — Progress Notes (Signed)
Subjective:  Patient ID: Elijah [name], male    DOB: [birth year]  Age: 81 y.o. MRN: [id number]  CC:  Chief Complaint  Patient presents with   Sore Throat    Pt reports sore throat starting Friday, worsening through the weekend, reports some congestion in his chest and some sinus drainage,     HPI Elijah [name] presents for   Sore throat, congestion, cough Started 3 days ago, Friday, 01/17/2022 Initial sore throat progressed to some cough/chest congestion and sinus drainage past 2 days No fever. No shortness of breath, no chest pain.  Min HA yesterday, no confusion.  Fatigue after workout Saturday.  No sick contacts.     No prior covid infection.  COVID risk of complication score of 5. Received primary series of COVID-vaccine and booster most recently January 2022.   History of chronic kidney disease, hypertension, hyperlipidemia, and on Xarelto for history of atrial fibrillation with history of CVA. Lab Results  Component Value Date   CREATININE 1.25 10/30/2021    Immunization History  Administered Date(s) Administered   Fluad Quad(high Dose 65+) 04/27/2019, 08/14/2020   Influenza Split 05/04/2012   Influenza, High Dose Seasonal PF 08/26/2017, 06/04/2018   PFIZER(Purple Top)SARS-COV-2 Vaccination 10/03/2019, 11/01/2019, 08/14/2020   Pneumococcal Conjugate-13 02/12/2017   Pneumococcal Polysaccharide-23 07/15/2007   Td 04/05/2003, 12/11/2017   Tetanus 06/05/2014    History Patient Active Problem List   Diagnosis Date Noted   Urinary and fecal incontinence 10/14/2021   Spinal stenosis of lumbar region 09/23/2021   Double vision 12/14/2017   Gross hematuria 02/12/2017   Hyperglycemia 08/13/2016   History of CVA (cerebrovascular accident) 05/14/2016   Thyroid nodule    History of melanoma 12/04/2015   GERD (gastroesophageal reflux disease) 12/04/2015   CKD (chronic kidney disease), stage III ([place]) 12/07/2014   Osteoarthritis of left hip 08/09/2014   Actinic keratosis  06/05/2014   History of prostate cancer 09/23/2012   Hx of adenomatous colonic polyps 06/05/2011   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   Essential hypertension 04/07/2007   Hyperlipidemia 04/06/2007   Atrial fibrillation ([place]) 04/06/2007   Past Medical History:  Diagnosis Date   Adenomatous colon polyp    Arthritis 09/10/2011   rt. hip, knee, hands   Cancer ([place]) 09/10/2011   dx. Prostate cancer-bx. 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   Peripheral neuropathy    Permanent atrial fibrillation ([place]) 09/10/2011   Pre-diabetes    Prostate cancer ([place])    Stroke [facility])    Past Surgical History:  Procedure Laterality Date   COLONOSCOPY  04/2003, 06/05/2011   diverticulosis, internal and external hemorrhoids 2004 and 2012, 2 small polyps 2012   EYE SURGERY  09/10/2011   Only stitches to close cut, and observation of hematoma from injury, cataracts bilateral   INGUINAL HERNIA REPAIR Right 06/16/2019   Procedure: OPEN RIGHT INGUINAL HERNIA REPAIR;  Surgeon: Alphonsa Overall, MD;  Location: [place];  Service: General;  Laterality: Right;  OPEN RIGHT INGUINAL HERNIA REPAIR   LUMBAR LAMINECTOMY/DECOMPRESSION MICRODISCECTOMY N/A 09/23/2021   Procedure: Laminectomy and Foraminotomy - Lumbar Two- Lumbar Three - Lumbar Three- Lumbar Four - Lumbar Four- Lumbar  Five;  Surgeon: Kary Kos, MD;  Location: [place];  Service: Neurosurgery;  Laterality: N/A;  Laminectomy and Foraminotomy - Lumbar Two- Lumbar Three - Lumbar Three- Lumbar Four - Lumbar Four- Lumbar Five   ROBOT ASSISTED LAPAROSCOPIC RADICAL PROSTATECTOMY  09/15/2011   Procedure: ROBOTIC ASSISTED LAPAROSCOPIC RADICAL PROSTATECTOMY LEVEL 2;   Surgeon: Dutch Gray, MD;  [facility];  Service: Urology;  Laterality: N/A;         TONSILLECTOMY     Allergies  Allergen Reactions   Ciprofloxacin Nausea Only   Flagyl [Metronidazole] Nausea Only   Prior to Admission medications   Medication Sig Start Date End Date Taking? Authorizing Provider  acetaminophen (TYLENOL) 650 MG CR tablet Take 650 mg by mouth 2 (two) times daily.   Yes [provider]  cholecalciferol (VITAMIN D3) 25 MCG (1000 UT) tablet Take 1,000 Units by mouth 2 (two) times daily.   Yes [provider]  famotidine (PEPCID) 20 MG tablet TAKE 1 TABLET(20 MG) BY MOUTH TWICE DAILY 03/06/21  Yes [name], MD  hydrocortisone (ANUSOL-HC) 2.5 % rectal cream Place 1 application. rectally 2 (two) times daily. 10/16/21  Yes Gatha Mayer, MD  metoprolol succinate (TOPROL-XL) 25 MG 24 hr tablet TAKE 1 TABLET BY MOUTH DAILY WITH OR IMMEDIATELY FOLLOWING A MEAL 03/06/21  Yes [name], MD  Multiple Vitamin (MULTIVITAMIN WITH MINERALS) TABS tablet Take 1 tablet by mouth daily.   Yes [provider]  simvastatin (ZOCOR) 20 MG tablet TAKE 1 TABLET BY MOUTH EVERY DAY 08/02/21  Yes [name], MD  XARELTO 20 MG TABS tablet TAKE 1 TABLET(20 MG) BY MOUTH DAILY 09/02/21  Yes [name], MD   Social History   Socioeconomic History   Marital status: Married    Spouse name: Not on file   Number of children: 3   Years of education: Not on file   Highest education level: Not on file  Occupational History   Occupation: Consultant    Employer: RETIRED  Tobacco Use   Smoking status: Former    Types: Cigarettes    Quit date: 08/04/1968    Years since quitting: 53.4   Smokeless tobacco: Never  Vaping Use   Vaping Use: Never used  Substance and Sexual Activity   Alcohol use: No    Comment: none in 20 yrs   Drug use: No   Sexual activity: Yes  Other Topics Concern   Not on file  Social History Narrative   Married with 3 kids. 6  grandkids.       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       Hobbies: beach (place at [employer]) usually every 2 weeks.    Social Determinants of Health   Financial Resource Strain: Not on file  Food Insecurity: Not on file  Transportation Needs: Not on file  Physical Activity: Not on file  Stress: Not on file  Social Connections: Not on file  Intimate Partner Violence: Not on file    Review of Systems   Objective:   Vitals:   01/20/22 1158  BP: 138/62  Pulse: 87  Resp: 16  Temp: 98.2 F (36.8 C)  TempSrc: Temporal  Weight: 199 lb 3.2 oz (90.4 kg)  Height: '6\' 3"'$  (1.905 m)     Physical Exam Vitals reviewed.  Constitutional:      General: He is not in acute distress.    Appearance: He is well-developed. He is not ill-appearing,  toxic-appearing or diaphoretic.  HENT:     Head: Normocephalic and atraumatic.  Neck:     Vascular: No carotid bruit or JVD.  Cardiovascular:     Rate and Rhythm: Normal rate and regular rhythm.     Heart sounds: Normal heart sounds. No murmur heard. Pulmonary:     Effort: Pulmonary effort is normal.     Breath sounds: Normal breath sounds. No rales.     Comments: Speaking in full sentences without respiratory distress, lungs clear. Musculoskeletal:     Right lower leg: No edema.     Left lower leg: No edema.  Skin:    General: Skin is warm and dry.  Neurological:     Mental Status: He is alert and oriented to person, place, and time.  Psychiatric:        Mood and Affect: Mood normal.     Results for orders placed or performed in visit on 01/20/22  POCT rapid strep A  Result Value Ref [place]   Rapid Strep A Screen Negative Negative  POC COVID-19  Result Value Ref Range   SARS Coronavirus 2 Ag Positive (A) Negative   31 minutes spent during visit, including chart review, counseling and assimilation of information, exam, discussion of plan, questions regarding covid treatment and his travel plans  and chart completion.    Assessment & Plan:  Elijah [name] is a 81 y.o. male . COVID-19 virus infection - Plan: molnupiravir EUA (LAGEVRIO) 200 mg CAPS capsule  Sore throat - Plan: POCT rapid strep A, POC COVID-19  Sinus drainage - Plan: POC COVID-19  Acute cough - Plan: benzonatate (TESSALON) 100 MG capsule  COVID-19 infection, day 3 based on onset of symptoms.  No red flags on exam or history, no dyspnea, speaking in full sentences without respiratory distress and lungs were clear on exam.   -Based on his risk of complication score, medical history, discussed antiviral for COVID-19 infection.  Excluded from Paxlovid due to anticoagulant.  Molnupiravir prescribed.  He is considering that medication but discussed he has an additional 2 days to make that decision. potential side effects and risks were discussed including the risk of rebound COVID.  Understanding expressed.    -Symptomatic care with Mucinex, Tessalon Perles as needed, fluids, rest, ER/urgent care precautions discussed.  Masking and isolation precautions discussed per CDC guidelines, and we discussed his travel plans on Saturday as well as a speaking engagement on Monday.  If any dyspnea or not significantly improved symptoms by the end of the week I would recommend against flying but if he is improving, can wear well-fitting mask around others through day 10.  Er/Urgent care precautions discussed. Understanding expressed.  Meds ordered this encounter  Medications   molnupiravir EUA (LAGEVRIO) 200 mg CAPS capsule    Sig: Take 4 capsules (800 mg total) by mouth 2 (two) times daily for 5 days.    Dispense:  40 capsule    Refill:  0   benzonatate (TESSALON) 100 MG capsule    Sig: Take 1 capsule (100 mg total) by mouth 3 (three) times daily as needed for cough.    Dispense:  20 capsule    Refill:  0   Patient Instructions  Mucinex over the counter for cough, but tessalon also prescribed if needed.  Molnupiravir sent to your  pharmacy.  Isolate through Wednesday, then mask around others for 5 additional days. See info below.  It may be best that your spouse or other sick contacts test for  covid every 2 days temporarily.  Return to the clinic or go to the nearest emergency room if any of your symptoms worsen or new symptoms occur.  Information is provided from the CDC. There is a link provided below for more information if needed.   Everyone who has presumed or confirmed COVID-19 should stay home and isolate from other people for at least 5 full days (day 0 is the first day of symptoms or the date of the day of the positive viral test for asymptomatic persons). You can end isolation after 5 full days if you are fever-free for 24 hours without the use of fever-reducing medication and your other symptoms have improved (Loss of taste and smell may persist for weeks or months after recovery and need not delay the end of isolation). You should continue to wear a well-fitting mask around others at home and in public for 5 additional days (day 6 through day 10) after the end of your 5-day isolation period. If you are unable to wear a mask when around others, you should continue to isolate for a full 10 days. Avoid people who have weakened immune systems or are more likely to get very sick from COVID-19, and nursing homes and other high-risk settings, until after at least 10 days.  If you continue to have fever or your other symptoms have not improved after 5 days of isolation, you should wait to end your isolation until you are fever-free for 24 hours without the use of fever-reducing medication and your other symptoms have improved. Continue to wear a well-fitting mask through day 10.   [email or web address].org/.html            Signed,   Merri Ray, MD [place], [facility] 01/20/22 1:05 PM

## 2022-01-28 ENCOUNTER — Ambulatory Visit: Payer: Medicare Other | Admitting: Endocrinology

## 2022-02-21 ENCOUNTER — Other Ambulatory Visit: Payer: Self-pay | Admitting: Family Medicine

## 2022-02-27 ENCOUNTER — Telehealth: Payer: Self-pay | Admitting: Family Medicine

## 2022-02-27 NOTE — Telephone Encounter (Signed)
Copied from [place] [id number]. Topic: Medicare AWV >> Feb 27, 2022 10:21 AM Devoria Glassing wrote: Reason for CRM: Left message for patient to schedule Annual Wellness Visit.  Please schedule with Nurse Health Advisor Charlott Rakes, RN at [facility]. This appt can be telephone or office visit. Please call [phone] ask for [facility]

## 2022-03-11 ENCOUNTER — Encounter: Payer: Self-pay | Admitting: Endocrinology

## 2022-03-11 ENCOUNTER — Ambulatory Visit (INDEPENDENT_AMBULATORY_CARE_PROVIDER_SITE_OTHER): Payer: Medicare Other | Admitting: Endocrinology

## 2022-03-11 VITALS — BP 140/80 | HR 53 | Ht 75.0 in | Wt 202.8 lb

## 2022-03-11 DIAGNOSIS — E042 Nontoxic multinodular goiter: Secondary | ICD-10-CM

## 2022-03-11 DIAGNOSIS — L820 Inflamed seborrheic keratosis: Secondary | ICD-10-CM | POA: Diagnosis not present

## 2022-03-11 DIAGNOSIS — Z08 Encounter for follow-up examination after completed treatment for malignant neoplasm: Secondary | ICD-10-CM | POA: Diagnosis not present

## 2022-03-11 DIAGNOSIS — Z1283 Encounter for screening for malignant neoplasm of skin: Secondary | ICD-10-CM | POA: Diagnosis not present

## 2022-03-11 DIAGNOSIS — X32XXXD Exposure to sunlight, subsequent encounter: Secondary | ICD-10-CM | POA: Diagnosis not present

## 2022-03-11 DIAGNOSIS — D225 Melanocytic nevi of trunk: Secondary | ICD-10-CM | POA: Diagnosis not present

## 2022-03-11 DIAGNOSIS — Z85820 Personal history of malignant melanoma of skin: Secondary | ICD-10-CM | POA: Diagnosis not present

## 2022-03-11 DIAGNOSIS — L570 Actinic keratosis: Secondary | ICD-10-CM | POA: Diagnosis not present

## 2022-03-11 NOTE — Progress Notes (Unsigned)
Patient ID: Elijah [name], male   DOB: 1940/09/23, 81 y.o.   MRN: [id number]            Reason for Appointment: Follow-up of thyroid    History of Present Illness:   The patient's dominant left thyroid nodule was first discovered when he was having CT angiography for his cerebrovascular disease  The thyroid ultrasound on 05/14/16 showed the following Has a dominant nodule, location Left; Superior Size: Measures 3.7 x 2.0 x 4.3 cm, Composition: solid/almost completely solid (2) Echogenicity: isoechoic (1) Also has 6-10 nodules over 1 cm bilaterally  He was able to get a biopsy done of his dominant nodule in 11/2016 which was benign as follows Thyroid biopsy results:  LEFT UPPER POLE, FINE NEEDLE ASPIRATION (SPECIMEN 1 OF 1, COLLECTED 11/11/16): CONSISTENT WITH BENIGN FOLLICULAR NODULE (BETHESDA CATEGORY II).   RECENT history:  He was last seen in 01/2021  No complaints of local pressure or discomfort in the thyroid area and no difficulty swallowing He does not feel any swelling in his neck  Thyroid levels have been normal consistently  Ultrasound from June 2021 showed slight increase in size of the nodules #1 and 2 on the right side Nodule #1 had biopsy done in 9/22 Also his left thyroid nodule was smaller  The 3.4 cm right upper thyroid nodule was benign on Afirma testing in 04/2021  Lab Results  Component Value Date   FREET4 0.84 01/24/2021   FREET4 1.02 12/28/2019   FREET4 0.97 11/25/2017   TSH 0.63 10/30/2021   TSH 1.00 01/24/2021   TSH 0.59 09/20/2020   His last ultrasound was done in 7/22  1. Nodule 1 (TI-RADS 3) demonstrates interval threshold growth. Further evaluation with FNA is recommended. 2. Previously seen isthmus nodule not as well delineated on today's exam. 3. Dominant left thyroid nodule was previously biopsied in 2018. It is not significantly changed in size since prior exam.   Ultrasound in 2021:   Dominant left nodule: Previously biopsied nodule  occupying the majority of the left mid gland measures 4.6 x 3.1 x 2.8 cm, slightly smaller   Allergies as of 03/11/2022       Reactions   Ciprofloxacin Nausea Only   Flagyl [metronidazole] Nausea Only        Medication List        Accurate as of March 11, 2022 11:59 PM. If you have any questions, ask your nurse or doctor.          acetaminophen 650 MG CR tablet Commonly known as: TYLENOL Take 650 mg by mouth 2 (two) times daily.   benzonatate 100 MG capsule Commonly known as: TESSALON Take 1 capsule (100 mg total) by mouth 3 (three) times daily as needed for cough.   cholecalciferol 25 MCG (1000 UNIT) tablet Commonly known as: VITAMIN D3 Take 1,000 Units by mouth 2 (two) times daily.   famotidine 20 MG tablet Commonly known as: PEPCID TAKE 1 TABLET(20 MG) BY MOUTH TWICE DAILY   hydrocortisone 2.5 % rectal cream Commonly known as: ANUSOL-HC Place 1 application. rectally 2 (two) times daily.   metoprolol succinate 25 MG 24 hr tablet Commonly known as: TOPROL-XL TAKE 1 TABLET BY MOUTH DAILY WITH OR IMMEDIATELY FOLLOWING A MEAL   multivitamin with minerals Tabs tablet Take 1 tablet by mouth daily.   simvastatin 20 MG tablet Commonly known as: ZOCOR TAKE 1 TABLET BY MOUTH EVERY DAY   Xarelto 20 MG Tabs tablet Generic drug: rivaroxaban TAKE 1 TABLET(20 MG)  BY MOUTH DAILY        Allergies:  Allergies  Allergen Reactions   Ciprofloxacin Nausea Only   Flagyl [Metronidazole] Nausea Only    Past Medical History:  Diagnosis Date   Adenomatous colon polyp    Arthritis 09/10/2011   rt. hip, knee, hands   Cancer ([place]) 09/10/2011   dx. Prostate cancer-bx. done 12'12, surgery planned   Cataract    CKD (chronic kidney disease)    stage III (09/01/18)   Diverticulosis    Dysrhythmia    GERD (gastroesophageal reflux disease)    Hemorrhoid    Hx of adenomatous colonic polyps 06/05/2011   Hyperlipidemia    Hypertension    Neuropathy of foot 09/10/2011    bilateral ? etiology   Peripheral neuropathy    Permanent atrial fibrillation ([place]) 09/10/2011   Pre-diabetes    Prostate cancer ([place])    Stroke ([place])     There is no history of radiation to the neck in childhood  Past Surgical History:  Procedure Laterality Date   COLONOSCOPY  04/2003, 06/05/2011   diverticulosis, internal and external hemorrhoids 2004 and 2012, 2 small polyps 2012   EYE SURGERY  09/10/2011   Only stitches to close cut, and observation of hematoma from injury, cataracts bilateral   INGUINAL HERNIA REPAIR Right 06/16/2019   Procedure: OPEN RIGHT INGUINAL HERNIA REPAIR;  Surgeon: Alphonsa Overall, MD;  Location: [place];  Service: General;  Laterality: Right;  OPEN RIGHT INGUINAL HERNIA REPAIR   LUMBAR LAMINECTOMY/DECOMPRESSION MICRODISCECTOMY N/A 09/23/2021   Procedure: Laminectomy and Foraminotomy - Lumbar Two- Lumbar Three - Lumbar Three- Lumbar Four - Lumbar Four- Lumbar Five;  Surgeon: Kary Kos, MD;  Location: [place];  Service: Neurosurgery;  Laterality: N/A;  Laminectomy and Foraminotomy - Lumbar Two- Lumbar Three - Lumbar Three- Lumbar Four - Lumbar Four- Lumbar Five   ROBOT ASSISTED LAPAROSCOPIC RADICAL PROSTATECTOMY  09/15/2011   Procedure: ROBOTIC ASSISTED LAPAROSCOPIC RADICAL PROSTATECTOMY LEVEL 2;  Surgeon: Dutch Gray, MD;  [facility];  Service: Urology;  Laterality: N/A;         TONSILLECTOMY      Family History  Problem Relation Age of Onset   Diabetes Mother    Hypertension Mother    Diverticulosis Mother    Heart attack Father 17       MI. died at 33   Prostate cancer Brother    Alzheimer's disease Paternal Grandmother    Heart attack Paternal Grandfather    Colon cancer Neg Hx     Social History:  reports that he quit smoking about 53 years ago. His smoking use included cigarettes. He has never used smokeless tobacco. He reports that he does not drink alcohol and does not use drugs.   Review of Systems:    Wt Readings from Last 3  Encounters:  03/11/22 202 lb 12.8 oz (92 kg)  01/20/22 199 lb 3.2 oz (90.4 kg)  12/04/21 201 lb (91.2 kg)    Examination:   BP (!) 140/80   Pulse (!) 53   Ht '6\' 3"'$  (1.905 m)   Wt 202 lb 12.8 oz (92 kg)   SpO2 98%   BMI 25.35 kg/m           THYROID:   Right lobe of the thyroid is better felt on swallowing and is 3-3.5 times normal It is slightly firm and smooth and no distinct nodules palpated  Left lobe is mostly palpable on swallowing, about twice normal, no separate nodules felt  No lymphadenopathy in the neck Pemberton sign negative No stridor   Assessment/Plan:  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 Any transcriptional errors that result from this process are unintentional.

## 2022-03-13 ENCOUNTER — Other Ambulatory Visit: Payer: Self-pay | Admitting: Family Medicine

## 2022-03-23 ENCOUNTER — Other Ambulatory Visit: Payer: Self-pay | Admitting: Family Medicine

## 2022-04-28 ENCOUNTER — Encounter: Payer: Self-pay | Admitting: *Deleted

## 2022-05-02 ENCOUNTER — Encounter: Payer: Self-pay | Admitting: Family Medicine

## 2022-05-02 ENCOUNTER — Ambulatory Visit (INDEPENDENT_AMBULATORY_CARE_PROVIDER_SITE_OTHER): Payer: Medicare Other | Admitting: Family Medicine

## 2022-05-02 VITALS — BP 120/78 | HR 46 | Temp 98.2°F | Ht 75.0 in | Wt 202.4 lb

## 2022-05-02 DIAGNOSIS — I4821 Permanent atrial fibrillation: Secondary | ICD-10-CM | POA: Diagnosis not present

## 2022-05-02 DIAGNOSIS — R739 Hyperglycemia, unspecified: Secondary | ICD-10-CM

## 2022-05-02 DIAGNOSIS — Z8546 Personal history of malignant neoplasm of prostate: Secondary | ICD-10-CM | POA: Diagnosis not present

## 2022-05-02 DIAGNOSIS — E785 Hyperlipidemia, unspecified: Secondary | ICD-10-CM | POA: Diagnosis not present

## 2022-05-02 MED ORDER — GABAPENTIN 100 MG PO CAPS: 100.0000 mg | ORAL_CAPSULE | Freq: Two times a day (BID) | ORAL | 3 refills | Status: DC

## 2022-05-02 NOTE — Patient Instructions (Addendum)
Start with gabapentin '100mg'$  each morning. If you tolerate this without too much sedation or dizziness can add an afternoon or around lunch dose in.  - lets stop medicine if significant side effects (particularly balance) -could consider seeing Dr. Davy Pique (new consult) or Dr. Glennon Mac back if needed  Recommended follow up: Return in about 6 months (around 10/31/2022) for followup or sooner if needed.Schedule b4 you leave. - sooner if needed if you want to discuss neuropathy more

## 2022-05-02 NOTE — Progress Notes (Signed)
Phone [phone] In person visit   Subjective:   Elijah [name] is a 81 y.o. year old very pleasant male patient who presents for/with See problem oriented charting Chief Complaint  Patient presents with   Follow-up    Pt wants to talk about nerve pain   Hyperlipidemia   Hypertension   Past Medical History-  Patient Active Problem List   Diagnosis Date Noted   History of CVA (cerebrovascular accident) 05/14/2016    Priority: High   History of melanoma 12/04/2015    Priority: High   Atrial fibrillation ([place]) 04/06/2007    Priority: High   Hyperglycemia 08/13/2016    Priority: Medium    Thyroid nodule     Priority: Medium    GERD (gastroesophageal reflux disease) 12/04/2015    Priority: Medium    CKD (chronic kidney disease), stage III ([place]) 12/07/2014    Priority: Medium    History of prostate cancer 09/23/2012    Priority: Medium    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    Priority: Medium    Essential hypertension 04/07/2007    Priority: Medium    Hyperlipidemia 04/06/2007    Priority: Medium    Double vision 12/14/2017    Priority: Low   Gross hematuria 02/12/2017    Priority: Low   Osteoarthritis of left hip 08/09/2014    Priority: Low   Actinic keratosis 06/05/2014    Priority: Low   Hx of adenomatous colonic polyps 06/05/2011    Priority: Low   Urinary and fecal incontinence 10/14/2021   Spinal stenosis of lumbar region 09/23/2021    Medications- reviewed and updated Current Outpatient Medications  Medication Sig Dispense Refill   acetaminophen (TYLENOL) 650 MG CR tablet Take 650 mg by mouth 2 (two) times daily.     cholecalciferol (VITAMIN D3) 25 MCG (1000 UT) tablet Take 1,000 Units by mouth 2 (two) times daily.     famotidine (PEPCID) 20 MG tablet TAKE 1 TABLET(20 MG) BY MOUTH TWICE DAILY 180 tablet 3   gabapentin (NEURONTIN) 100 MG capsule Take 1 capsule (100 mg total) by mouth 2 (two) times daily. 60 capsule 3   metoprolol  succinate (TOPROL-XL) 25 MG 24 hr tablet TAKE 1 TABLET BY MOUTH DAILY WITH OR IMMEDIATELY FOLLOWING A MEAL 90 tablet 3   Multiple Vitamin (MULTIVITAMIN WITH MINERALS) TABS tablet Take 1 tablet by mouth daily.     simvastatin (ZOCOR) 20 MG tablet TAKE 1 TABLET BY MOUTH EVERY DAY 90 tablet 1   XARELTO 20 MG TABS tablet TAKE 1 TABLET(20 MG) BY MOUTH DAILY 90 tablet 1   benzonatate (TESSALON) 100 MG capsule Take 1 capsule (100 mg total) by mouth 3 (three) times daily as needed for cough. (Patient not taking: Reported on 05/02/2022) 20 capsule 0   hydrocortisone (ANUSOL-HC) 2.5 % rectal cream Place 1 application. rectally 2 (two) times daily. (Patient not taking: Reported on 05/02/2022) 30 g 1   No current facility-administered medications for this visit.     Objective:  BP 120/78   Pulse (!) 46   Temp 98.2 F (36.8 C)   Ht '6\' 3"'$  (1.905 m)   Wt 202 lb 6.4 oz (91.8 kg)   SpO2 98%   BMI 25.30 kg/m  Gen: NAD, resting comfortably CV: bradycardic and irregular   Lungs: CTAB no crackles, wheeze, rhonchi Ext: trace edema Skin: warm, dry     Assessment and Plan   # idiopathic neuropathy S: previously has not wanted to use medication. Ongoing bilateral  pain- worse on right after prior trauma with wood hitting shin -prior laminectomy L2-L5- noted prior mild to moderate residual spinal stenosis L2-L3 -surgery in February and was told could take up to a year for improvement -sleeping ok thankfully.  -not interested in lyrica  A/P: Neuropathy continues to be bothersome-likely multifactorial with baseline neuropathy over 20 years and also residual spinal stenosis after prior laminectomy in February of this year - Looking at Dr. Marisue Brooklyn last note consideration for next step was low-dose gabapentin and we opted to pursue that together - We discussed possible referral back to [place] versus getting in with Dr. Davy Pique with physiatry as part of [place] neurosurgery as next step -  rates pain as 3/10 when bothers him- will use as baseline   #Atrial fibrillation S: Patient compliant with metoprolol 25 mg extended release for rate control.  Also uses Xarelto 20 mg for anticoagulation. A/P: appropriately anticoagulated and rate control- especially for stroke prevention  -Have to monitor GFR and if consistently below 50 may reduce Xarelto to 15 mg   #Hyperlipidemia/history of stroke with LDL goal under 70 though likely was embolic S: Compliant with simvastatin 20 mg.  LDL most recently at goal under 70 Lab Results  Component Value Date   CHOL 130 10/30/2021   HDL 38.10 (L) 10/30/2021   LDLCALC 63 10/30/2021   LDLDIRECT 74.0 03/09/2019   TRIG 141.0 10/30/2021   CHOLHDL 3 10/30/2021  A/P: hopefully stable- update direct LDL today. Continue current meds for now as long as LDL under 70 still       #Hypertension S: Compliant with metoprolol though primarily for rate control BP Readings from Last 3 Encounters:  05/02/22 120/78  03/11/22 (!) 140/80  01/20/22 138/62  A/P: Controlled. Continue current medications.       #CKD stage III S: GFR has been in the 50s.  Patient knows to avoid NSAIDs-also particularly with anticoagulation.  Would prefer H2 blockers over PPI A/P: hopefully stable- update cmp today.    # Hyperglycemia- has had elevated A1c in the past and will update this with labs today  Lab Results  Component Value Date   HGBA1C 5.8 (H) 09/19/2021   HGBA1C 6.0 03/20/2021   HGBA1C 5.9 09/20/2020   %Thyroid nodule- follows with Dr. Dwyane Dee. We annually Update TSH with labs  at least annually due to prior thyroid nodule though had benign biopsy in 2018.   % History of prostate cancer-now released- previously followed with Dr. Alinda Money after robotic prostatectomy-recheck PSA with [place]- last checked with urology in march 2023- now released   Recommended follow up: Return in about 6 months (around 10/31/2022) for followup or sooner if needed.Schedule b4 you  leave. Future Appointments  Date Time Provider [place]  03/12/2023 11:30 AM Elayne Snare, MD [id number] None    Lab/Order associations:   ICD-10-CM   1. Permanent atrial fibrillation (HCC)  I48.21     2. Hyperlipidemia, unspecified hyperlipidemia type  E78.5 CBC with Differential/Platelet    Comprehensive metabolic panel    LDL cholesterol, direct    3. Hyperglycemia  R73.9 HgB A1c    4. History of prostate cancer  Z85.46 PSA      Meds ordered this encounter  Medications   gabapentin (NEURONTIN) 100 MG capsule    Sig: Take 1 capsule (100 mg total) by mouth 2 (two) times daily.    Dispense:  60 capsule    Refill:  3    Return precautions advised.  [name],  MD

## 2022-05-03 LAB — COMPREHENSIVE METABOLIC PANEL
AG Ratio: 1.7 (calc) (ref 1.0–2.5)
ALT: 20 U/L (ref 9–46)
AST: 18 U/L (ref 10–35)
Albumin: 4.3 g/dL (ref 3.6–5.1)
Alkaline phosphatase (APISO): 93 U/L (ref 35–144)
BUN/Creatinine Ratio: 20 (calc) (ref 6–22)
BUN: 26 mg/dL — ABNORMAL HIGH (ref 7–25)
CO2: 24 mmol/L (ref 20–32)
Calcium: 9.8 mg/dL (ref 8.6–10.3)
Chloride: 107 mmol/L (ref 98–110)
Creat: 1.31 mg/dL — ABNORMAL HIGH (ref 0.70–1.22)
Globulin: 2.6 g/dL (calc) (ref 1.9–3.7)
Glucose, Bld: 87 mg/dL (ref 65–99)
Potassium: 5.1 mmol/L (ref 3.5–5.3)
Sodium: 142 mmol/L (ref 135–146)
Total Bilirubin: 0.7 mg/dL (ref 0.2–1.2)
Total Protein: 6.9 g/dL (ref 6.1–8.1)

## 2022-05-03 LAB — PSA: PSA: <0.04 ng/mL (ref <=4.00)

## 2022-05-03 LAB — CBC WITH DIFFERENTIAL/PLATELET
Absolute Monocytes: 700 cells/uL (ref 200–950)
Basophils Absolute: 98 cells/uL (ref 0–200)
Basophils Relative: 1.4 %
Eosinophils Absolute: 217 cells/uL (ref 15–500)
Eosinophils Relative: 3.1 %
HCT: 45.6 % (ref 38.5–50.0)
Hemoglobin: 15.4 g/dL (ref 13.2–17.1)
Lymphs Abs: 1393 cells/uL (ref 850–3900)
MCH: 31.3 pg (ref 27.0–33.0)
MCHC: 33.8 g/dL (ref 32.0–36.0)
MCV: 92.7 fL (ref 80.0–100.0)
MPV: 10.4 fL (ref 7.5–12.5)
Monocytes Relative: 10 %
Neutro Abs: 4592 cells/uL (ref 1500–7800)
Neutrophils Relative %: 65.6 %
Platelets: 210 10*3/uL (ref 140–400)
RBC: 4.92 10*6/uL (ref 4.20–5.80)
RDW: 13.1 % (ref 11.0–15.0)
Total Lymphocyte: 19.9 %
WBC: 7 10*3/uL (ref 3.8–10.8)

## 2022-05-03 LAB — HEMOGLOBIN A1C
Hgb A1c MFr Bld: 5.8 % of total Hgb — ABNORMAL HIGH (ref <5.7)
Mean Plasma Glucose: 120 mg/dL
eAG (mmol/L): 6.6 mmol/L

## 2022-05-03 LAB — LDL CHOLESTEROL, DIRECT: Direct LDL: 83 mg/dL (ref <100)

## 2022-05-06 MED ORDER — ATORVASTATIN CALCIUM 20 MG PO TABS: 20.0000 mg | ORAL_TABLET | Freq: Every day | ORAL | 3 refills | Status: DC

## 2022-05-06 NOTE — Addendum Note (Signed)
Addended by: Bing Neighbors on: 05/06/2022 03:57 PM   Modules accepted: Orders

## 2022-05-11 ENCOUNTER — Encounter: Payer: Self-pay | Admitting: Family Medicine

## 2022-06-03 ENCOUNTER — Ambulatory Visit: Payer: Medicare Other | Attending: Cardiovascular Disease | Admitting: Cardiovascular Disease

## 2022-06-03 ENCOUNTER — Encounter: Payer: Self-pay | Admitting: Cardiovascular Disease

## 2022-06-03 VITALS — BP 132/82 | HR 47 | Ht 75.0 in | Wt 204.0 lb

## 2022-06-03 DIAGNOSIS — I4821 Permanent atrial fibrillation: Secondary | ICD-10-CM | POA: Diagnosis not present

## 2022-06-03 NOTE — Patient Instructions (Signed)
Medication Instructions:  Your physician recommends that you continue on your current medications as directed. Please refer to the Current Medication list given to you today.  *If you need a refill on your cardiac medications before your next appointment, please call your pharmacy*   Follow-Up: At [facility], you and your health needs are our priority.  As part of our continuing mission to provide you with exceptional heart care, we have created designated Provider Care Teams.  These Care Teams include your primary Cardiologist (physician) and Advanced Practice Providers (APPs -  Physician Assistants and Nurse Practitioners) who all work together to provide you with the care you need, when you need it.  Your next appointment:   1 year(s)  The format for your next appointment:   In Person  Provider:   You may see Augustus E Mealor, MD or one of the following Advanced Practice Providers on your designated Care Team:   Renee Ursuy, PA-C Michael "Andy" Tillery, PA-C    Important Information About Sugar       

## 2022-06-03 NOTE — Progress Notes (Signed)
PCP:  [name], MD Primary Cardiologist: None Electrophysiologist: Melida Quitter, MD   Elijah [name] is a 81 y.o. male seen today for Melida Quitter, MD for routine electrophysiology followup.  Since last being seen in our clinic the patient reports doing very well overall. No symptoms of fast or slow HRs.  he denies chest pain, palpitations, dyspnea, PND, orthopnea, nausea, vomiting, dizziness, syncope, edema, weight gain, or early satiety.  Past Medical History:  Diagnosis Date   Adenomatous colon polyp    Arthritis 09/10/2011   rt. hip, knee, hands   Cancer ([place]) 09/10/2011   dx. Prostate cancer-bx. 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   Peripheral neuropathy    Permanent atrial fibrillation ([place]) 09/10/2011   Pre-diabetes    Prostate cancer ([place])    Stroke [facility])    Past Surgical History:  Procedure Laterality Date   COLONOSCOPY  04/2003, 06/05/2011   diverticulosis, internal and external hemorrhoids 2004 and 2012, 2 small polyps 2012   EYE SURGERY  09/10/2011   Only stitches to close cut, and observation of hematoma from injury, cataracts bilateral   INGUINAL HERNIA REPAIR Right 06/16/2019   Procedure: OPEN RIGHT INGUINAL HERNIA REPAIR;  Surgeon: Alphonsa Overall, MD;  Location: [place];  Service: General;  Laterality: Right;  OPEN RIGHT INGUINAL HERNIA REPAIR   LUMBAR LAMINECTOMY/DECOMPRESSION MICRODISCECTOMY N/A 09/23/2021   Procedure: Laminectomy and Foraminotomy - Lumbar Two- Lumbar Three - Lumbar Three- Lumbar Four - Lumbar Four- Lumbar Five;  Surgeon: Kary Kos, MD;  Location: [place];  Service: Neurosurgery;  Laterality: N/A;  Laminectomy and Foraminotomy - Lumbar Two- Lumbar  Three - Lumbar Three- Lumbar Four - Lumbar Four- Lumbar Five   ROBOT ASSISTED LAPAROSCOPIC RADICAL PROSTATECTOMY  09/15/2011   Procedure: ROBOTIC ASSISTED LAPAROSCOPIC RADICAL PROSTATECTOMY LEVEL 2;  Surgeon: Dutch Gray, MD;  [facility];  Service: Urology;  Laterality: N/A;         TONSILLECTOMY      Current Outpatient Medications  Medication Sig Dispense Refill   acetaminophen (TYLENOL) 650 MG CR tablet Take 650 mg by mouth 2 (two) times daily.     atorvastatin (LIPITOR) 20 MG tablet Take 1 tablet (20 mg total) by mouth daily. 90 tablet 3   cholecalciferol (VITAMIN D3) 25 MCG (1000 UT) tablet Take 1,000 Units by mouth 2 (two) times daily.     famotidine (PEPCID) 20 MG tablet TAKE 1 TABLET(20 MG) BY MOUTH TWICE DAILY 180 tablet 3   metoprolol succinate (TOPROL-XL) 25 MG 24 hr tablet TAKE 1 TABLET BY MOUTH DAILY WITH OR IMMEDIATELY FOLLOWING A MEAL 90 tablet 3   Multiple Vitamin (MULTIVITAMIN WITH MINERALS) TABS tablet Take 1 tablet by mouth daily.     XARELTO 20 MG TABS tablet TAKE 1 TABLET(20 MG) BY MOUTH DAILY 90 tablet 1   No current facility-administered medications for this visit.    Allergies  Allergen Reactions   Ciprofloxacin Nausea Only   Flagyl [Metronidazole] Nausea Only    Social History   Socioeconomic History   Marital status: Married    Spouse name: Not on file   Number of children: 3  Years of education: Not on file   Highest education level: Not on file  Occupational History   Occupation: Consultant    Employer: RETIRED  Tobacco Use   Smoking status: Former    Types: Cigarettes    Quit date: 08/04/1968    Years since quitting: 53.8   Smokeless tobacco: Never  Vaping Use   Vaping Use: Never used  Substance and Sexual Activity   Alcohol use: No    Comment: none in 20 yrs   Drug use: No   Sexual activity: Yes  Other Topics Concern   Not on file  Social History Narrative   Married with 3 kids. 6 grandkids.       Retired from [employer] in  2000. Semi retired as Optometrist currently.       Hobbies: beach (place at [employer]) usually every 2 weeks.    Social Determinants of Health   Financial Resource Strain: Not on file  Food Insecurity: Not on file  Transportation Needs: Not on file  Physical Activity: Not on file  Stress: Not on file  Social Connections: Not on file  Intimate Partner Violence: Not on file   Review of Systems: All other systems reviewed and are otherwise negative except as noted above.  Physical Exam: Vitals:   06/03/22 1131  BP: 132/82  Pulse: (!) 47  SpO2: 98%  Weight: 204 lb (92.5 kg)  Height: '6\' 3"'$  (1.905 m)    GEN- The patient is well appearing, alert and oriented x 3 today.   HEENT: normocephalic, atraumatic; sclera clear, conjunctiva pink; hearing intact; oropharynx clear; neck supple, no JVP Lymph- no cervical lymphadenopathy Lungs- Clear to ausculation bilaterally, normal work of breathing.  No wheezes, rales, rhonchi Heart- Irregularly irregular rate and rhythm, no murmurs, rubs or gallops, PMI not laterally displaced GI- soft, non-tender, non-distended, bowel sounds present, no hepatosplenomegaly Extremities- no clubbing, cyanosis, or edema; DP/PT/radial pulses 2+ bilaterally MS- no significant deformity or atrophy Skin- warm and dry, no rash or lesion Psych- euthymic mood, full affect Neuro- strength and sensation are intact  EKG is ordered. Personal review of EKG from today shows atrial fibrillation with controlled rates  Additional studies reviewed include: Previous EP office notes.   Assessment and Plan:  1. Permanent AF  Rate controlled and asymptomatic Continue Xarelto for CHA2DS2-VASc of at least 4. CHA2DS2/VAS Stroke Risk Points  Current as of 2 hours ago Labs last month looked stable.    2. HTN Stable on current regimen   Continue annual follow up. He follows up with PCP about every 6 months.  Melida Quitter, MD  06/03/22 12:02 PM

## 2022-06-10 DIAGNOSIS — Z08 Encounter for follow-up examination after completed treatment for malignant neoplasm: Secondary | ICD-10-CM | POA: Diagnosis not present

## 2022-06-10 DIAGNOSIS — D485 Neoplasm of uncertain behavior of skin: Secondary | ICD-10-CM | POA: Diagnosis not present

## 2022-06-10 DIAGNOSIS — D2272 Melanocytic nevi of left lower limb, including hip: Secondary | ICD-10-CM | POA: Diagnosis not present

## 2022-06-10 DIAGNOSIS — Z85820 Personal history of malignant melanoma of skin: Secondary | ICD-10-CM | POA: Diagnosis not present

## 2022-06-10 DIAGNOSIS — D225 Melanocytic nevi of trunk: Secondary | ICD-10-CM | POA: Diagnosis not present

## 2022-06-10 DIAGNOSIS — L821 Other seborrheic keratosis: Secondary | ICD-10-CM | POA: Diagnosis not present

## 2022-06-18 ENCOUNTER — Encounter: Payer: Self-pay | Admitting: Family Medicine

## 2022-09-18 ENCOUNTER — Other Ambulatory Visit: Payer: Self-pay | Admitting: Family Medicine

## 2022-10-23 ENCOUNTER — Encounter: Payer: Self-pay | Admitting: Family

## 2022-10-23 ENCOUNTER — Ambulatory Visit (INDEPENDENT_AMBULATORY_CARE_PROVIDER_SITE_OTHER): Payer: Medicare HMO | Admitting: Family

## 2022-10-23 VITALS — BP 163/101 | HR 57 | Temp 97.5°F | Ht 75.0 in | Wt 203.4 lb

## 2022-10-23 DIAGNOSIS — J209 Acute bronchitis, unspecified: Secondary | ICD-10-CM

## 2022-10-23 DIAGNOSIS — J029 Acute pharyngitis, unspecified: Secondary | ICD-10-CM | POA: Diagnosis not present

## 2022-10-23 LAB — POCT RAPID STREP A (OFFICE): Rapid Strep A Screen: NEGATIVE

## 2022-10-23 MED ORDER — BENZONATATE 100 MG PO CAPS: 200.0000 mg | ORAL_CAPSULE | Freq: Three times a day (TID) | ORAL | 0 refills | Status: AC | PRN

## 2022-10-23 MED ORDER — BENZONATATE 200 MG PO CAPS: 200.0000 mg | ORAL_CAPSULE | Freq: Three times a day (TID) | ORAL | 0 refills | Status: DC | PRN

## 2022-10-23 MED ORDER — AZITHROMYCIN 250 MG PO TABS: ORAL_TABLET | ORAL | 0 refills | Status: AC

## 2022-10-23 NOTE — Addendum Note (Signed)
Addended by: Verne Grain on: 10/23/2022 04:18 PM   Modules accepted: Orders

## 2022-10-23 NOTE — Progress Notes (Signed)
Patient ID: Elijah [name], male    DOB: Oct 25, 1940, 82 y.o.   MRN: [id number]  Chief Complaint  Patient presents with   Sinus Problem    Pt c/o sore throat, cough with bloody mucus, Congestion for about a week. Has tried ricola which did help with throat pain.     HPI:      Sinus sx:  Pt c/o sore throat, cough with yellow & bloody mucus, nasal drainage & Congestion for about a week. Has tried ricola lozenges which did help with throat pain.    .  Assessment & Plan:  1. Acute bronchitis, unspecified organism - lungs clear;  sending Zpack to start Saturday if sx are not better. Advised on use & SE. Sending Tessalon perles, advised to take OTC Coricidin for sinus sx, saline nasal spray tid & prn, humidifier overnight.  - azithromycin (ZITHROMAX) 250 MG tablet; Take 2 tablets on day 1, then 1 tablet daily on days 2 through 5  Dispense: 6 tablet; Refill: 0 - benzonatate (TESSALON) 100 MG capsule; Take 2 capsules (200 mg total) by mouth 3 (three) times daily as needed for up to 10 days for cough.  Dispense: 30 capsule; Refill: 0  Subjective:    Outpatient Medications Prior to Visit  Medication Sig Dispense Refill   acetaminophen (TYLENOL) 650 MG CR tablet Take 650 mg by mouth 2 (two) times daily.     atorvastatin (LIPITOR) 20 MG tablet Take 1 tablet (20 mg total) by mouth daily. 90 tablet 3   cholecalciferol (VITAMIN D3) 25 MCG (1000 UT) tablet Take 1,000 Units by mouth 2 (two) times daily.     famotidine (PEPCID) 20 MG tablet TAKE 1 TABLET(20 MG) BY MOUTH TWICE DAILY 180 tablet 3   metoprolol succinate (TOPROL-XL) 25 MG 24 hr tablet TAKE 1 TABLET BY MOUTH DAILY WITH OR IMMEDIATELY FOLLOWING A MEAL 90 tablet 3   Multiple Vitamin (MULTIVITAMIN WITH MINERALS) TABS tablet Take 1 tablet by mouth daily.     XARELTO 20 MG TABS tablet TAKE 1 TABLET(20 MG) BY MOUTH DAILY 90 tablet 1   No facility-administered medications prior to visit.   Past Medical History:  Diagnosis Date   Adenomatous colon  polyp    Arthritis 09/10/2011   rt. hip, knee, hands   Cancer ([place]) 09/10/2011   dx. Prostate cancer-bx. 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   Peripheral neuropathy    Permanent atrial fibrillation ([place]) 09/10/2011   Pre-diabetes    Prostate cancer ([place])    Stroke [facility])    Past Surgical History:  Procedure Laterality Date   COLONOSCOPY  04/2003, 06/05/2011   diverticulosis, internal and external hemorrhoids 2004 and 2012, 2 small polyps 2012   EYE SURGERY  09/10/2011   Only stitches to close cut, and observation of hematoma from injury, cataracts bilateral   INGUINAL HERNIA REPAIR Right 06/16/2019   Procedure: OPEN RIGHT INGUINAL HERNIA REPAIR;  Surgeon: Alphonsa Overall, MD;  Location: [place];  Service: General;  Laterality: Right;  OPEN RIGHT INGUINAL HERNIA REPAIR   LUMBAR LAMINECTOMY/DECOMPRESSION MICRODISCECTOMY N/A 09/23/2021   Procedure: Laminectomy and Foraminotomy - Lumbar Two- Lumbar Three - Lumbar  Three- Lumbar Four - Lumbar Four- Lumbar Five;  Surgeon: Kary Kos, MD;  Location: [place];  Service: Neurosurgery;  Laterality: N/A;  Laminectomy and Foraminotomy - Lumbar Two- Lumbar Three - Lumbar Three- Lumbar Four - Lumbar Four- Lumbar Five   ROBOT ASSISTED LAPAROSCOPIC RADICAL PROSTATECTOMY  09/15/2011   Procedure: ROBOTIC ASSISTED LAPAROSCOPIC RADICAL PROSTATECTOMY LEVEL 2;  Surgeon: Dutch Gray, MD;  [facility];  Service: Urology;  Laterality: N/A;         TONSILLECTOMY     Allergies  Allergen Reactions   Ciprofloxacin Nausea Only   Flagyl [Metronidazole] Nausea Only      Objective:    Physical Exam Vitals and nursing note reviewed.  Constitutional:      General: He is not in  acute distress.    Appearance: Normal appearance.  HENT:     Head: Normocephalic.     Right Ear: Tympanic membrane and ear canal normal.     Left Ear: Tympanic membrane and ear canal normal.     Nose:     Right Sinus: No maxillary sinus tenderness or frontal sinus tenderness.     Left Sinus: No maxillary sinus tenderness or frontal sinus tenderness.     Mouth/Throat:     Mouth: Mucous membranes are moist.     Pharynx: Posterior oropharyngeal erythema present. No pharyngeal swelling, oropharyngeal exudate or uvula swelling.     Tonsils: No tonsillar exudate or tonsillar abscesses.  Cardiovascular:     Rate and Rhythm: Normal rate and regular rhythm.  Pulmonary:     Effort: Pulmonary effort is normal.     Breath sounds: Normal breath sounds.  Musculoskeletal:        General: Normal range of motion.     Cervical back: Normal range of motion.  Lymphadenopathy:     Head:     Right side of head: No preauricular or posterior auricular adenopathy.     Left side of head: No preauricular or posterior auricular adenopathy.     Cervical: No cervical adenopathy.  Skin:    General: Skin is warm and dry.  Neurological:     Mental Status: He is alert and oriented to person, place, and time.  Psychiatric:        Mood and Affect: Mood normal.    BP (!) 163/101 (BP Location: Left Arm, Patient Position: Sitting, Cuff Size: Large)   Pulse (!) 57   Temp (!) 97.5 F (36.4 C) (Temporal)   Ht 6\' 3"  (1.905 m)   Wt 203 lb 6 oz (92.3 kg)   SpO2 97%   BMI 25.42 kg/m  Wt Readings from Last 3 Encounters:  10/23/22 203 lb 6 oz (92.3 kg)  06/03/22 204 lb (92.5 kg)  05/02/22 202 lb 6.4 oz (91.8 kg)     Jeanie Sewer, NP

## 2022-10-23 NOTE — Patient Instructions (Addendum)
It was very nice to see you today!   I have sent an antibiotic for you to start on Saturday if you are not feeling any better. I also have sent Tessalon perles to help your cough. You can also take over the counter Coricidin that can help your symptoms Also look for saline nasal spray and squirt each nostril, 2 times, several times per day. Drink plenty of water!       PLEASE NOTE:  If you had any lab tests please let us know if you have not heard back within a few days. You may see your results on MyChart before we have a chance to review them but we will give you a call once they are reviewed by [place]. If we ordered any referrals today, please let us know if you have not heard from their office within the next week.

## 2022-10-29 ENCOUNTER — Ambulatory Visit: Payer: Medicare Other | Admitting: Family Medicine

## 2022-11-10 NOTE — Progress Notes (Unsigned)
    Aleen Sells D.[facility] [address] [place] [address] Phone: [phone]   Assessment and Plan:     There are no diagnoses linked to this encounter.  ***   Pertinent previous records reviewed include ***   Follow Up: ***     Subjective:   I, Ulyses Panico, am serving as a Neurosurgeon for Doctor Richardean Sale   Chief Complaint: hip pain    HPI:    07/24/2021 Patient is an 82 year old male complaining of hip and back pain. Patient states that he has had hip and back pain for about 6/7 years no MOI , has neuropathy. 4/5 years ago had plywood fall [place] has some pain when that foot is in the flexed position    08/07/2021  Patient states that he feels the same . Would like to talk about how he can fix it . After his last visit his back was killing him for a few days    11/11/2022 Patient states    Relevant Historical Information: Hypertension, CKD stage III, history of CVA on blood thinner  Additional pertinent review of systems negative.   Current Outpatient Medications:    acetaminophen (TYLENOL) 650 MG CR tablet, Take 650 mg by mouth 2 (two) times daily., Disp: , Rfl:    atorvastatin (LIPITOR) 20 MG tablet, Take 1 tablet (20 mg total) by mouth daily., Disp: 90 tablet, Rfl: 3   cholecalciferol (VITAMIN D3) 25 MCG (1000 UT) tablet, Take 1,000 Units by mouth 2 (two) times daily., Disp: , Rfl:    famotidine (PEPCID) 20 MG tablet, TAKE 1 TABLET(20 MG) BY MOUTH TWICE DAILY, Disp: 180 tablet, Rfl: 3   metoprolol succinate (TOPROL-XL) 25 MG 24 hr tablet, TAKE 1 TABLET BY MOUTH DAILY WITH OR IMMEDIATELY FOLLOWING A MEAL, Disp: 90 tablet, Rfl: 3   Multiple Vitamin (MULTIVITAMIN WITH MINERALS) TABS tablet, Take 1 tablet by mouth daily., Disp: , Rfl:    XARELTO 20 MG TABS tablet, TAKE 1 TABLET(20 MG) BY MOUTH DAILY, Disp: 90 tablet, Rfl: 1   Objective:     There were no vitals filed for this visit.    There is no height  or weight on file to calculate BMI.    Physical Exam:    ***   Electronically signed by:  Aleen Sells D.[facility] 9:29 AM 11/10/22

## 2022-11-11 ENCOUNTER — Ambulatory Visit: Payer: Medicare HMO | Admitting: Sports Medicine

## 2022-11-11 VITALS — BP 138/78 | Ht 75.0 in | Wt 207.0 lb

## 2022-11-11 DIAGNOSIS — M160 Bilateral primary osteoarthritis of hip: Secondary | ICD-10-CM

## 2022-11-11 NOTE — Patient Instructions (Addendum)
Good to see you Tylenol [phone] mg 2-3 times a day for pain relief  Orthopedic referral  As needed follow up

## 2022-11-14 ENCOUNTER — Telehealth: Payer: Self-pay | Admitting: Family Medicine

## 2022-11-14 NOTE — Telephone Encounter (Signed)
FYI: This call has been transferred to Access Nurse. Once the result note has been entered staff can address the message at that time.  Patient called in with the following symptoms:  Red Word: Blood in urine and left side back pain, mild   Please advise at [facility] [phone]   Message is routed to Provider Pool and [facility] Triage Alcario Drought, triage, stated pt will be contacted by Access nurse.

## 2022-11-14 NOTE — Telephone Encounter (Signed)
Pt scheduled for OV on 11/18/22.

## 2022-11-14 NOTE — Telephone Encounter (Signed)
Advised Go to ED Now   Patient Name: Elijah [name] ER Gender: Male DOB: 14-Feb-1941 Age: 82 Y 4 M 9 D Return Phone Number: [phone] (Primary) Address: City/ State/ Zip: [place]  [address] Client [facility] - [facility], [facility] Provider Tana Conch- MD Contact Type Call Who Is Calling Patient / Member / Family / Caregiver Call Type Triage / Clinical Relationship To Patient Self Return Phone Number [phone] (Primary) Chief Complaint Urine, Blood In Reason for Call Symptomatic / Request for Health Information Initial Comment Caller states pt has blood in urine. Left side back pain. Translation No Nurse Assessment Nurse: Daphine Deutscher, RN, Cala Bradford Date/Time Lamount Cohen Time): 11/14/2022 9:29:05 AM Confirm and document reason for call. If symptomatic, describe symptoms. ---caller states he has blood in his urine and left side back pain. no fever. Does the patient have any new or worsening symptoms? ---Yes Will a triage be completed? ---Yes Related visit to physician within the last 2 weeks? ---No Does the PT have any chronic conditions? (i.e. diabetes, asthma, this includes High risk factors for pregnancy, etc.) ---Yes List chronic conditions. ---a-fib, CKD, arthritis Is this a behavioral health or substance abuse call? ---No Guidelines Guideline Title Affirmed Question Affirmed Notes Nurse Date/Time (Eastern Time) Urine - Blood In Passing pure blood or large blood clots (i.e., size > a dime) (Exception: Fleck or small strands.) Daphine Deutscher, RN, Cala Bradford 11/14/2022 9:30:47 AM Disp. Time Lamount Cohen Time) Disposition Final User 11/14/2022 9:34:04 AM Go to ED Now Yes Daphine Deutscher, RN, Cala Bradford  Final Disposition 11/14/2022 9:34:04 AM Go to ED Now Yes Daphine Deutscher, RN, Anders Simmonds Disagree/Comply Comply Caller Understands Yes PreDisposition Call Doctor Care Advice Given Per Guideline GO TO ED NOW: *  You need to be seen in the Emergency Department. * Go to the ED at ___________ Hospital. * Leave now. Drive carefully. ANOTHER ADULT SHOULD DRIVE: * It is better and safer if another adult drives instead of you. BRING MEDICINES: * Bring a list of your current medicines when you go to the Emergency Department (ER). CARE ADVICE given per Urine, Blood In (Adult) guideline.   Referrals [facility] - ED

## 2022-11-14 NOTE — Telephone Encounter (Addendum)
Patient states:  - Triage nurse informed him to go to ED - He went to restroom shortly after and saw no blood  - Due to this he will keep eye on it; only had 2 episodes, one @ 4:30 and other @ 7  I informed pt that if worsen over weekend to go to ED. Pt verbalized understanding and agreed. Awaiting triage note.

## 2022-11-15 ENCOUNTER — Emergency Department (HOSPITAL_BASED_OUTPATIENT_CLINIC_OR_DEPARTMENT_OTHER)
Admission: EM | Admit: 2022-11-15 | Discharge: 2022-11-15 | Disposition: A | Discharge status: Home / Self Care | Payer: Medicare HMO | Attending: Emergency Medicine | Admitting: Emergency Medicine

## 2022-11-15 ENCOUNTER — Encounter (HOSPITAL_BASED_OUTPATIENT_CLINIC_OR_DEPARTMENT_OTHER): Payer: Self-pay | Admitting: Emergency Medicine

## 2022-11-15 ENCOUNTER — Other Ambulatory Visit: Payer: Self-pay

## 2022-11-15 DIAGNOSIS — N183 Chronic kidney disease, stage 3 unspecified: Secondary | ICD-10-CM | POA: Insufficient documentation

## 2022-11-15 DIAGNOSIS — Z7901 Long term (current) use of anticoagulants: Secondary | ICD-10-CM | POA: Diagnosis not present

## 2022-11-15 DIAGNOSIS — R319 Hematuria, unspecified: Secondary | ICD-10-CM | POA: Diagnosis present

## 2022-11-15 DIAGNOSIS — I129 Hypertensive chronic kidney disease with stage 1 through stage 4 chronic kidney disease, or unspecified chronic kidney disease: Secondary | ICD-10-CM | POA: Insufficient documentation

## 2022-11-15 DIAGNOSIS — Z8546 Personal history of malignant neoplasm of prostate: Secondary | ICD-10-CM | POA: Diagnosis not present

## 2022-11-15 DIAGNOSIS — N3001 Acute cystitis with hematuria: Secondary | ICD-10-CM | POA: Diagnosis not present

## 2022-11-15 LAB — CBC WITH DIFFERENTIAL/PLATELET
Abs Immature Granulocytes: 0.03 10*3/uL (ref 0.00–0.07)
Basophils Absolute: 0.1 10*3/uL (ref 0.0–0.1)
Basophils Relative: 1 %
Eosinophils Absolute: 0.2 10*3/uL (ref 0.0–0.5)
Eosinophils Relative: 2 %
HCT: 47 % (ref 39.0–52.0)
Hemoglobin: 15.6 g/dL (ref 13.0–17.0)
Immature Granulocytes: 0 %
Lymphocytes Relative: 16 %
Lymphs Abs: 1.5 10*3/uL (ref 0.7–4.0)
MCH: 31.2 pg (ref 26.0–34.0)
MCHC: 33.2 g/dL (ref 30.0–36.0)
MCV: 94 fL (ref 80.0–100.0)
Monocytes Absolute: 1 10*3/uL (ref 0.1–1.0)
Monocytes Relative: 11 %
Neutro Abs: 6.5 10*3/uL (ref 1.7–7.7)
Neutrophils Relative %: 70 %
Platelets: 235 10*3/uL (ref 150–400)
RBC: 5 MIL/uL (ref 4.22–5.81)
RDW: 13.4 % (ref 11.5–15.5)
WBC: 9.3 10*3/uL (ref 4.0–10.5)
nRBC: 0 % (ref 0.0–0.2)

## 2022-11-15 LAB — URINALYSIS, MICROSCOPIC (REFLEX)
RBC / HPF: >50 RBC/hpf (ref 0–5)
WBC, UA: >50 WBC/hpf (ref 0–5)

## 2022-11-15 LAB — BASIC METABOLIC PANEL
Anion gap: 10 (ref 5–15)
BUN: 29 mg/dL — ABNORMAL HIGH (ref 8–23)
CO2: 24 mmol/L (ref 22–32)
Calcium: 10.2 mg/dL (ref 8.9–10.3)
Chloride: 105 mmol/L (ref 98–111)
Creatinine, Ser: 1.4 mg/dL — ABNORMAL HIGH (ref 0.61–1.24)
GFR, Estimated: 50 mL/min — ABNORMAL LOW (ref >60)
Glucose, Bld: 112 mg/dL — ABNORMAL HIGH (ref 70–99)
Potassium: 4.4 mmol/L (ref 3.5–5.1)
Sodium: 139 mmol/L (ref 135–145)

## 2022-11-15 LAB — URINALYSIS, ROUTINE W REFLEX MICROSCOPIC

## 2022-11-15 LAB — PROTIME-INR
INR: 2.6 — ABNORMAL HIGH (ref 0.8–1.2)
Prothrombin Time: 27.9 seconds — ABNORMAL HIGH (ref 11.4–15.2)

## 2022-11-15 LAB — APTT: aPTT: 57 seconds — ABNORMAL HIGH (ref 24–36)

## 2022-11-15 MED ORDER — CEFPODOXIME PROXETIL 100 MG PO TABS: 100.0000 mg | ORAL_TABLET | Freq: Two times a day (BID) | ORAL | 0 refills | Status: AC

## 2022-11-15 MED ORDER — SODIUM CHLORIDE 0.9 % IV SOLN
1.0000 g | Freq: Once | INTRAVENOUS | Status: AC
  Administered 2022-11-15: 1 g via INTRAVENOUS
  Filled 2022-11-15: qty 10

## 2022-11-15 NOTE — ED Notes (Signed)
RN reviewed discharge instructions with pt. Pt verbalized understanding and had no further questions. VSS upon discharge.  

## 2022-11-15 NOTE — ED Triage Notes (Signed)
Blood in urine ,started Thursday. Called primary ,unable to see him. Blood was only in few of voids but now today is everytime he voids. No new medication,pt is on xarelto.

## 2022-11-15 NOTE — ED Provider Notes (Signed)
[place] EMERGENCY DEPARTMENT AT [facility] Provider Note   CSN: [id number] Arrival date & time: 11/15/22  1450     History {Add pertinent medical, surgical, social history, OB history to HPI:1} No chief complaint on file.   Elijah [name] is a 82 y.o. male with HTN, HLD, afib, h/o prostate cancer, GERD, CKD stage 3, h/o CVA, idiopathic peripheral neuropathy, spinal stenosis who presents with hematuria.   Blood in urine ,started Thursday. Called primary ,unable to see him. Blood was only in few of voids but now today is everytime he voids. No new medication, pt is on xarelto. Denies any f/c, abd pain, flank pain or rectal pain, and has h/o prostate cancer s/p prostatectomy many years ago.     HPI     Home Medications Prior to Admission medications   Medication Sig Start Date End Date Taking? Authorizing Provider  acetaminophen (TYLENOL) 650 MG CR tablet Take 650 mg by mouth 2 (two) times daily.    [provider]  atorvastatin (LIPITOR) 20 MG tablet Take 1 tablet (20 mg total) by mouth daily. 05/06/22   Shelva Majestic, MD  cholecalciferol (VITAMIN D3) 25 MCG (1000 UT) tablet Take 1,000 Units by mouth 2 (two) times daily.    [provider]  famotidine (PEPCID) 20 MG tablet TAKE 1 TABLET(20 MG) BY MOUTH TWICE DAILY 03/25/22   Shelva Majestic, MD  metoprolol succinate (TOPROL-XL) 25 MG 24 hr tablet TAKE 1 TABLET BY MOUTH DAILY WITH OR IMMEDIATELY FOLLOWING A MEAL 03/25/22   Shelva Majestic, MD  Multiple Vitamin (MULTIVITAMIN WITH MINERALS) TABS tablet Take 1 tablet by mouth daily.    [provider]  XARELTO 20 MG TABS tablet TAKE 1 TABLET(20 MG) BY MOUTH DAILY 09/18/22   Shelva Majestic, MD      Allergies    Ciprofloxacin and Flagyl [metronidazole]    Review of Systems   Review of Systems Review of systems Negative for f/c.  A 10 point review of systems was performed and is negative unless otherwise reported in HPI.  Physical  Exam Updated Vital Signs BP (!) 180/92 (BP Location: Right Arm)   Pulse 64   Temp 98.4 F (36.9 C)   Resp 18   SpO2 99%  Physical Exam General: Normal appearing male, lying in bed.  HEENT: PERRLA, Sclera anicteric, MMM, trachea midline.  Cardiology: RRR, no murmurs/rubs/gallops. BL radial and DP pulses equal bilaterally.  Resp: Normal respiratory rate and effort. CTAB, no wheezes, rhonchi, crackles.  Abd: Soft, non-tender, non-distended. No rebound tenderness or guarding.  GU: Deferred. MSK: No peripheral edema or signs of trauma. Extremities without deformity or TTP. No cyanosis or clubbing. Skin: warm, dry. No rashes or lesions. Back: No CVA tenderness Neuro: A&Ox4, CNs II-XII grossly intact. MAEs. Sensation grossly intact.  Psych: Normal mood and affect.   ED Results / Procedures / Treatments   Labs (all labs ordered are listed, but only abnormal results are displayed) Labs Reviewed  PROTIME-INR - Abnormal; Notable for the following components:      Result Value   Prothrombin Time 27.9 (*)    INR 2.6 (*)    All other components within normal limits  APTT - Abnormal; Notable for the following components:   aPTT 57 (*)    All other components within normal limits  URINALYSIS, ROUTINE W REFLEX MICROSCOPIC - Abnormal; Notable for the following components:   Color, Urine RED (*)    APPearance TURBID (*)    Glucose,  UA   (*)    Value: TEST NOT REPORTED DUE TO COLOR INTERFERENCE OF URINE PIGMENT   Hgb urine dipstick   (*)    Value: TEST NOT REPORTED DUE TO COLOR INTERFERENCE OF URINE PIGMENT   Bilirubin Urine   (*)    Value: TEST NOT REPORTED DUE TO COLOR INTERFERENCE OF URINE PIGMENT   Ketones, ur   (*)    Value: TEST NOT REPORTED DUE TO COLOR INTERFERENCE OF URINE PIGMENT   Protein, ur   (*)    Value: TEST NOT REPORTED DUE TO COLOR INTERFERENCE OF URINE PIGMENT   Nitrite   (*)    Value: TEST NOT REPORTED DUE TO COLOR INTERFERENCE OF URINE PIGMENT   Leukocytes,Ua   (*)     Value: TEST NOT REPORTED DUE TO COLOR INTERFERENCE OF URINE PIGMENT   All other components within normal limits  BASIC METABOLIC PANEL - Abnormal; Notable for the following components:   Glucose, Bld 112 (*)    BUN 29 (*)    Creatinine, Ser 1.40 (*)    GFR, Estimated 50 (*)    All other components within normal limits  URINALYSIS, MICROSCOPIC (REFLEX) - Abnormal; Notable for the following components:   Bacteria, UA MANY (*)    All other components within normal limits  URINE CULTURE  CBC WITH DIFFERENTIAL/PLATELET    EKG None  Radiology No results found.  Procedures Procedures  {Document cardiac monitor, telemetry assessment procedure when appropriate:1}  Medications Ordered in ED Medications  cefTRIAXone (ROCEPHIN) 1 g in sodium chloride 0.9 % 100 mL IVPB (has no administration in time range)    ED Course/ Medical Decision Making/ A&P                          Medical Decision Making Amount and/or Complexity of Data Reviewed Labs: ordered. Decision-making details documented in ED Course.    This patient presents to the ED for concern of acute hematuria, this involves an extensive number of treatment options, and is a complaint that carries with it a high risk of complications and morbidity.  I considered the following differential and admission for this acute, potentially life threatening condition.   MDM:    Differential diagnosis for this patient's hematuria includes but is not limited to:  Iatrogenic/post-procedure, GU trauma, nephrolithiasis, infection, malignancy, radiation cystitis, BPH, prostatitis, foreign body.  -Glomerular causes include glomerulonephritis, IgA nephropathy (Berger disease), Lupus nephritis, hereditary nephritis (Alport syndrome), serum sickness, erythema multiforme. -Non-glomerular causes include interstitial nephritis, pyelonephritis, papillary necrosis (SCD, NSAID use, DM), vascular, malignancy, PCKD, TB, renal trauma. -Hematologic  causes include primary coagulopathy (hemophilia), pharmacologic anticoagulation, SCD -Myoglobinuria (positive blood, no RBCs) = rhabdo -Hemoglobinuria (positive blood, no RBCs) = TTP/HUS, DIC, hemolytic anemia, paroxysmal nocturnal hemoglobinuria -Misc causes include eroding AAA, malignant HTN, exercise-induced, renal vein thrombosis, schistosomiasis   Clinical Course as of 11/15/22 1750  Sat Nov 15, 2022  1703 Urinalysis, Routine w reflex microscopic -Urine, Clean Catch(!) Gross hematuria [HN]  1703 INR(!): 2.6 On Xarelto [HN]  1703 Prothrombin Time(!): 27.9 [HN]  1703 APTT(!): 57 [HN]  1703 Bacteria, UA(!): MANY Consider UTI highest likelihood [HN]  1704 WBC: 9.3 No leukocytosis [HN]  1714 Creatinine(!): 1.40 BL 1.2-1.3, very mildly elevated from BL [HN]    Clinical Course User Index [HN] Loetta Rough, MD    Labs: I Ordered, and personally interpreted labs.  The pertinent results include:  ***  Imaging Studies ordered: I ordered imaging  studies including *** I independently visualized and interpreted imaging. I agree with the radiologist interpretation  Additional history obtained from ***.  External records from outside source obtained and reviewed including ***  Cardiac Monitoring: .The patient was maintained on a cardiac monitor.  I personally viewed and interpreted the cardiac monitored which showed an underlying rhythm of: ***  Reevaluation: After the interventions noted above, I reevaluated the patient and found that they have :{resolved/improved/worsened:23923::"improved"}  Social Determinants of Health: .***  Disposition:  ***  Co morbidities that complicate the patient evaluation . Past Medical History:  Diagnosis Date  . Adenomatous colon polyp   . Arthritis 09/10/2011   rt. hip, knee, hands  . Cancer 09/10/2011   dx. Prostate cancer-bx. done 12'12, surgery planned  . Cataract   . CKD (chronic kidney disease)    stage III (09/01/18)  .  Diverticulosis   . Dysrhythmia   . GERD (gastroesophageal reflux disease)   . Hemorrhoid   . Hx of adenomatous colonic polyps 06/05/2011  . Hyperlipidemia   . Hypertension   . Neuropathy of foot 09/10/2011   bilateral ? etiology  . Peripheral neuropathy   . Permanent atrial fibrillation 09/10/2011  . Pre-diabetes   . Prostate cancer   . Stroke      Medicines Meds ordered this encounter  Medications  . cefTRIAXone (ROCEPHIN) 1 g in sodium chloride 0.9 % 100 mL IVPB    Order Specific Question:   Antibiotic Indication:    Answer:   UTI    I have reviewed the patients home medicines and have made adjustments as needed  Problem List / ED Course: Problem List Items Addressed This Visit   None        {Document critical care time when appropriate:1} {Document review of labs and clinical decision tools ie heart score, Chads2Vasc2 etc:1}  {Document your independent review of radiology images, and any outside records:1} {Document your discussion with family members, caretakers, and with consultants:1} {Document social determinants of health affecting pt's care:1} {Document your decision making why or why not admission, treatments were needed:1}  This note was created using dictation software, which may contain spelling or grammatical errors.

## 2022-11-15 NOTE — Discharge Instructions (Addendum)
Thank you for coming to [facility] Emergency Department. You were seen for blood in the urine. We did an exam, labs, and imaging, and these showed likely a urinary tract infection.  We gave you a dose of IV antibiotics here in the emergency department and discharged you with cefpodoxime which you should take 100 mg twice per day for 7 days. Please follow up with your primary care provider as previously scheduled on Tuesday.  If the blood in your urine does not stop after treating the infection you may need to be seen by your urologist..   Do not hesitate to return to the ED or call 911 if you experience: -Worsening symptoms -Abdominal or flank pain -Lightheadedness, passing out -Fevers/chills -Anything else that concerns you

## 2022-11-17 LAB — URINE CULTURE: Culture: NO GROWTH

## 2022-11-18 ENCOUNTER — Encounter: Payer: Self-pay | Admitting: Family Medicine

## 2022-11-18 ENCOUNTER — Ambulatory Visit (INDEPENDENT_AMBULATORY_CARE_PROVIDER_SITE_OTHER): Payer: Medicare HMO | Admitting: Family Medicine

## 2022-11-18 VITALS — BP 124/64 | HR 50 | Temp 97.9°F | Ht 75.0 in | Wt 204.8 lb

## 2022-11-18 DIAGNOSIS — E041 Nontoxic single thyroid nodule: Secondary | ICD-10-CM

## 2022-11-18 DIAGNOSIS — R739 Hyperglycemia, unspecified: Secondary | ICD-10-CM

## 2022-11-18 DIAGNOSIS — N183 Chronic kidney disease, stage 3 unspecified: Secondary | ICD-10-CM

## 2022-11-18 DIAGNOSIS — E785 Hyperlipidemia, unspecified: Secondary | ICD-10-CM | POA: Diagnosis not present

## 2022-11-18 DIAGNOSIS — R310 Gross hematuria: Secondary | ICD-10-CM

## 2022-11-18 DIAGNOSIS — Z131 Encounter for screening for diabetes mellitus: Secondary | ICD-10-CM

## 2022-11-18 DIAGNOSIS — I4821 Permanent atrial fibrillation: Secondary | ICD-10-CM

## 2022-11-18 LAB — LIPID PANEL
Cholesterol: 118 mg/dL (ref 0–200)
HDL: 41.4 mg/dL (ref >39.00)
LDL Cholesterol: 58 mg/dL (ref 0–99)
NonHDL: 76.98
Total CHOL/HDL Ratio: 3
Triglycerides: 96 mg/dL (ref 0.0–149.0)
VLDL: 19.2 mg/dL (ref 0.0–40.0)

## 2022-11-18 LAB — CBC WITH DIFFERENTIAL/PLATELET
Basophils Absolute: 0.1 10*3/uL (ref 0.0–0.1)
Basophils Relative: 1.3 % (ref 0.0–3.0)
Eosinophils Absolute: 0.2 10*3/uL (ref 0.0–0.7)
Eosinophils Relative: 2.2 % (ref 0.0–5.0)
HCT: 43.9 % (ref 39.0–52.0)
Hemoglobin: 14.7 g/dL (ref 13.0–17.0)
Lymphocytes Relative: 14.3 % (ref 12.0–46.0)
Lymphs Abs: 1.1 10*3/uL (ref 0.7–4.0)
MCHC: 33.4 g/dL (ref 30.0–36.0)
MCV: 93.6 fl (ref 78.0–100.0)
Monocytes Absolute: 0.8 10*3/uL (ref 0.1–1.0)
Monocytes Relative: 11.2 % (ref 3.0–12.0)
Neutro Abs: 5.4 10*3/uL (ref 1.4–7.7)
Neutrophils Relative %: 71 % (ref 43.0–77.0)
Platelets: 227 10*3/uL (ref 150.0–400.0)
RBC: 4.69 Mil/uL (ref 4.22–5.81)
RDW: 13.8 % (ref 11.5–15.5)
WBC: 7.6 10*3/uL (ref 4.0–10.5)

## 2022-11-18 LAB — COMPREHENSIVE METABOLIC PANEL
ALT: 21 U/L (ref 0–53)
AST: 21 U/L (ref 0–37)
Albumin: 4.1 g/dL (ref 3.5–5.2)
Alkaline Phosphatase: 108 U/L (ref 39–117)
BUN: 29 mg/dL — ABNORMAL HIGH (ref 6–23)
CO2: 27 mEq/L (ref 19–32)
Calcium: 9.8 mg/dL (ref 8.4–10.5)
Chloride: 106 mEq/L (ref 96–112)
Creatinine, Ser: 1.31 mg/dL (ref 0.40–1.50)
GFR: 51.1 mL/min — ABNORMAL LOW (ref >60.00)
Glucose, Bld: 88 mg/dL (ref 70–99)
Potassium: 5.1 mEq/L (ref 3.5–5.1)
Sodium: 138 mEq/L (ref 135–145)
Total Bilirubin: 0.6 mg/dL (ref 0.2–1.2)
Total Protein: 6.5 g/dL (ref 6.0–8.3)

## 2022-11-18 LAB — HEMOGLOBIN A1C: Hgb A1c MFr Bld: 6.2 % (ref 4.6–6.5)

## 2022-11-18 LAB — TSH: TSH: 0.96 u[IU]/mL (ref 0.35–5.50)

## 2022-11-18 NOTE — Patient Instructions (Addendum)
Let us know if you decide to get [facility] at your pharmacy.  We have placed a referral for you today to urology. In some cases you will see # listed below- you can call this if you have not heard within a week. If you do not see # listed- you should receive a mychart message or phone call within a week with the # to call.   Please stop by lab before you go If you have mychart- we will send your results within 3 business days of [place] receiving them.  If you do not have mychart- we will call you about results within 5 business days of [place] receiving them.  *please also note that you will see labs on mychart as soon as they post. I will later go in and write notes on them- will say "notes from Dr. Durene Cal"   Recommended follow up: Return in about 5 months (around 04/20/2023) for followup or sooner if needed.Schedule b4 you leave.  Or 7 months

## 2022-11-18 NOTE — Progress Notes (Signed)
Phone [phone] In person visit   Subjective:   Elijah [name] is a 82 y.o. year old very pleasant male patient who presents for/with See problem oriented charting Chief Complaint  Patient presents with   Medical Management of Chronic Issues    Pt had ER visit on 04/13.   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    Priority: Medium    Essential hypertension 04/07/2007    Priority: Medium    Hyperlipidemia 04/06/2007    Priority: Medium    Double vision 12/14/2017    Priority: Low   Gross hematuria 02/12/2017    Priority: Low   Osteoarthritis of left hip 08/09/2014    Priority: Low   Actinic keratosis 06/05/2014    Priority: Low   Hx of adenomatous colonic polyps 06/05/2011    Priority: Low   Urinary and fecal incontinence 10/14/2021   Spinal stenosis of lumbar region 09/23/2021    Medications- reviewed and updated Current Outpatient Medications  Medication Sig Dispense Refill   acetaminophen (TYLENOL) 650 MG CR tablet Take 650 mg by mouth 2 (two) times daily.     atorvastatin (LIPITOR) 20 MG tablet Take 1 tablet (20 mg total) by mouth daily. 90 tablet 3   cefpodoxime (VANTIN) 100 MG tablet Take 1 tablet (100 mg total) by mouth 2 (two) times daily for 7 days. 14 tablet 0   cholecalciferol (VITAMIN D3) 25 MCG (1000 UT) tablet Take 1,000 Units by mouth 2 (two) times daily.      famotidine (PEPCID) 20 MG tablet TAKE 1 TABLET(20 MG) BY MOUTH TWICE DAILY 180 tablet 3   metoprolol succinate (TOPROL-XL) 25 MG 24 hr tablet TAKE 1 TABLET BY MOUTH DAILY WITH OR IMMEDIATELY FOLLOWING A MEAL 90 tablet 3   Multiple Vitamin (MULTIVITAMIN WITH MINERALS) TABS tablet Take 1 tablet by mouth daily.     XARELTO 20 MG TABS tablet TAKE 1 TABLET(20 MG) BY MOUTH DAILY 90 tablet 1   No current facility-administered medications for this visit.     Objective:  BP 124/64   Pulse (!) 50   Temp 97.9 F (36.6 C)   Ht  (1.905 m)   Wt 204 lb 12.8 oz (92.9 kg)   SpO2 98%   BMI 25.60 kg/m  Gen: NAD, resting comfortably CV: RRR no murmurs rubs or gallops Lungs: CTAB no crackles, wheeze, rhonchi Ext: trace to 1+ left > right  Skin: warm, dry     Assessment and Plan   # ER follow up  for gross hematuria S: Patient was seen in the department on 11/15/2022-emergency department physician has not yet completed the note.  But diagnosis is listed as hemorrhagic cystitis.  Creatinine slightly worse to 1.4, INR at 2.6 but on xarelto, PTT elevated but on xarelto, urinalysis only says red and turbid-microscopic urinalysis shows  greater than 50 RBCs, greater than 50 WBCs, many bacteria-he was given a dose of ceftriaxone and discharged home and also sent home on cefpodoxime 100 mg twice daily for 7 days-they discussed if blood in the urine does not resolve (per AVS) after treatment then would need to see urology.  Please note with PTT and INR that patient is on Xarelto -he reports had been strainging at [facility] on [place]- wonder if he triggered it -getting some burning in last day and more frequency -first 3 days Friday, Saturday, Sunday- light blood on Friday and worsened Saturday and Sunday- in last day has turned more pinkish and then today clear A/P: Gross hematuria but with dysuria and polyuria starting actually after antibiotics- we opted to get another urine culture (finish antibiotics for  now) and refer to urology. Had seen Dr. Laverle Patter in the past due to prostate cancer    #Atrial fibrillation S: Patient compliant with metoprolol 25 mg extended release for rate control (slightly bradycardic)  Also uses Xarelto 20 mg for anticoagulation. A/P: Appropriately anticoagulated and rate controlled-continue current medication -Have to monitor GFR and if consistently below 50 may reduce Xarelto to 15 mg-right at 50 on last check -considered pausing xarelto but with improvement will hold off   #Hyperlipidemia/history of stroke with LDL goal under 70 though likely was embolic S: Compliant with atorvastatin 20 mg (up from simvastatin).  LDL most recently at goal under 70  Lab Results  Component Value Date   CHOL 130 10/30/2021   HDL 38.10 (L) 10/30/2021   LDLCALC 63 10/30/2021   LDLDIRECT 83 05/02/2022   TRIG 141.0 10/30/2021   CHOLHDL 3 10/30/2021   A/P: not fasting but triglyceride(s) ok- update lipid panel- likely continue current medications - want LDL under 70    #Hypertension S: Compliant with metoprolol though primarily for rate control   BP Readings from Last 3 Encounters:  11/18/22 124/64  11/15/22 (!) 156/80  11/11/22 138/78   A/P: stable- continue current medicines    #CKD stage III S: GFR has been in the 50s-right at 50 on most recent check.  Patient knows to avoid NSAIDs-also particularly with anticoagulation.  Would prefer H2 blockers over PPI A/P: Stable on recent check-continue to monitor at least every 3 to 6 months- with mild worsenign in ED we will check again  # Hyperglycemia- has had elevated A1c in the past and will update this with labs today -thankfully not increasing on recent labs Lab Results  Component Value Date   HGBA1C 5.8 (H) 05/02/2022   HGBA1C 5.8 (H) 09/19/2021   HGBA1C 6.0 03/20/2021   %Thyroid nodule-upcoming follow up with Dr. Lucianne Muss Lab Results  Component Value Date   TSH 0.63 10/30/2021   Recommended follow up: Return in about 5  months (around 04/20/2023) for followup or sooner if needed.Schedule b4 you leave. Or 7 months Future Appointments  Date Time Provider Department Center  03/12/2023 11:30 AM Reather Littler, MD [id number] None   Lab/Order associations: NOT fasting   ICD-10-CM   1. Gross hematuria  R31.0 Ambulatory referral to Urology    Urine Culture    2. Hyperlipidemia, unspecified hyperlipidemia type  E78.5 CBC with Differential/Platelet    Comprehensive metabolic panel    Lipid panel    3. Hyperglycemia  R73.9 Hemoglobin A1c    4. Screening for diabetes mellitus  Z13.1     5. Permanent atrial fibrillation Chronic I48.21     6. Stage 3 chronic kidney disease, unspecified whether stage  3a or 3b CKD Chronic N18.30     7. Thyroid nodule  E04.1 TSH     No orders of the defined types were placed in this encounter.  Return precautions advised.  Tana Conch, MD

## 2022-11-19 LAB — URINE CULTURE
MICRO NUMBER:: 14831387
Result:: NO GROWTH
SPECIMEN QUALITY:: ADEQUATE

## 2022-12-18 ENCOUNTER — Ambulatory Visit (INDEPENDENT_AMBULATORY_CARE_PROVIDER_SITE_OTHER): Payer: Medicare HMO | Admitting: Physician Assistant

## 2022-12-18 ENCOUNTER — Encounter: Payer: Self-pay | Admitting: Physician Assistant

## 2022-12-18 VITALS — BP 134/74 | HR 64 | Temp 98.0°F | Ht 75.0 in | Wt 206.4 lb

## 2022-12-18 DIAGNOSIS — R051 Acute cough: Secondary | ICD-10-CM | POA: Diagnosis not present

## 2022-12-18 MED ORDER — AMOXICILLIN-POT CLAVULANATE 875-125 MG PO TABS: 1.0000 | ORAL_TABLET | Freq: Two times a day (BID) | ORAL | 0 refills | Status: DC

## 2022-12-18 NOTE — Progress Notes (Signed)
Elijah [name] is a 82 y.o. male here for a new problem.  History of Present Illness:   Chief Complaint  Patient presents with   Cough    Pt c/o Cough with yellow mucus/blood, sore throat and chest congestion, Present for a week. Has tried throat lozenges. Pt states he tested negative for Covid last weekend.    HPI  Cough: He complains of productive cough, mucus production (yellow), sore throat, and sinus and chest congestion for the last week.  His symptoms have not worsened or improved since they started.  He has only taken throat lozenges to help symptoms.  Denies any hx of asthma or COPD.  Reports hx of bronchitis as a child.  He tested negative for Covid over the weekend.  He reports possible exposure in the airport while returning from a business trip in [place].   Denies SHORTNESS OF BREATH, chest pain, lightheadedness, dizziness  Past Medical History:  Diagnosis Date   Adenomatous colon polyp    Arthritis 09/10/2011   rt. hip, knee, hands   Cancer (HCC) 09/10/2011   dx. Prostate cancer-bx. 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   Peripheral neuropathy    Permanent atrial fibrillation (HCC) 09/10/2011   Pre-diabetes    Prostate cancer (HCC)    Stroke (HCC)      Social History   Tobacco Use   Smoking status: Former    Types: Cigarettes    Quit date: 08/04/1968    Years since quitting: 54.4   Smokeless tobacco: Never  Vaping Use   Vaping Use: Never used  Substance Use Topics   Alcohol use: No    Comment: none in 20 yrs   Drug use: No    Past Surgical History:  Procedure Laterality Date   COLONOSCOPY  04/2003, 06/05/2011   diverticulosis, internal and external hemorrhoids 2004  and 2012, 2 small polyps 2012   EYE SURGERY  09/10/2011   Only stitches to close cut, and observation of hematoma from injury, cataracts bilateral   INGUINAL HERNIA REPAIR Right 06/16/2019   Procedure: OPEN RIGHT INGUINAL HERNIA REPAIR;  Surgeon: Ovidio Kin, MD;  Location: [facility] OR;  Service: General;  Laterality: Right;  OPEN RIGHT INGUINAL HERNIA REPAIR   LUMBAR LAMINECTOMY/DECOMPRESSION MICRODISCECTOMY N/A 09/23/2021   Procedure: Laminectomy and Foraminotomy - Lumbar Two- Lumbar Three - Lumbar Three- Lumbar Four - Lumbar Four- Lumbar Five;  Surgeon: Donalee Citrin, MD;  Location: [facility] OR;  Service: Neurosurgery;  Laterality: N/A;  Laminectomy and Foraminotomy - Lumbar Two- Lumbar Three - Lumbar Three- Lumbar Four - Lumbar Four- Lumbar Five   ROBOT ASSISTED LAPAROSCOPIC RADICAL PROSTATECTOMY  09/15/2011   Procedure: ROBOTIC ASSISTED LAPAROSCOPIC RADICAL PROSTATECTOMY LEVEL 2;  Surgeon: Crecencio Mc, MD;  Location: [facility] ORS;  Service: Urology;  Laterality: N/A;         TONSILLECTOMY      Family History  Problem Relation Age of Onset   Diabetes Mother    Hypertension Mother    Diverticulosis Mother    Heart attack Father 75       MI. died at 64   Prostate cancer Brother    Alzheimer's disease Paternal Grandmother  Heart attack Paternal Grandfather    Colon cancer Neg Hx     Allergies  Allergen Reactions   Ciprofloxacin Nausea Only   Flagyl [Metronidazole] Nausea Only    Current Medications:   Current Outpatient Medications:    acetaminophen (TYLENOL) 650 MG CR tablet, Take 650 mg by mouth 2 (two) times daily., Disp: , Rfl:    amoxicillin-clavulanate (AUGMENTIN) 875-125 MG tablet, Take 1 tablet by mouth 2 (two) times daily., Disp: 14 tablet, Rfl: 0   atorvastatin (LIPITOR) 20 MG tablet, Take 1 tablet (20 mg total) by mouth daily., Disp: 90 tablet, Rfl: 3   cholecalciferol (VITAMIN D3) 25 MCG (1000 UT) tablet, Take 1,000 Units by mouth 2 (two) times daily., Disp: , Rfl:    famotidine  (PEPCID) 20 MG tablet, TAKE 1 TABLET(20 MG) BY MOUTH TWICE DAILY, Disp: 180 tablet, Rfl: 3   metoprolol succinate (TOPROL-XL) 25 MG 24 hr tablet, TAKE 1 TABLET BY MOUTH DAILY WITH OR IMMEDIATELY FOLLOWING A MEAL, Disp: 90 tablet, Rfl: 3   Multiple Vitamin (MULTIVITAMIN WITH MINERALS) TABS tablet, Take 1 tablet by mouth daily., Disp: , Rfl:    XARELTO 20 MG TABS tablet, TAKE 1 TABLET(20 MG) BY MOUTH DAILY, Disp: 90 tablet, Rfl: 1   Review of Systems:   Review of Systems  HENT:  Positive for congestion (chest) and sore throat.   Respiratory:  Positive for cough and sputum production (yellow).     Vitals:   Vitals:   12/18/22 1045  BP: 134/74  Pulse: 64  Temp: 98 F (36.7 C)  TempSrc: Temporal  SpO2: 98%  Weight: 206 lb 6 oz (93.6 kg)  Height: 6\' 3"  (1.905 m)     Body mass index is 25.8 kg/m.  Physical Exam:   Physical Exam Vitals and nursing note reviewed.  Constitutional:      General: He is not in acute distress.    Appearance: He is well-developed. He is not ill-appearing or toxic-appearing.  HENT:     Head: Normocephalic and atraumatic.     Right Ear: Tympanic membrane, ear canal and external ear normal. Tympanic membrane is not erythematous, retracted or bulging.     Left Ear: Tympanic membrane, ear canal and external ear normal. Tympanic membrane is not erythematous, retracted or bulging.     Nose: Nose normal.     Right Sinus: No maxillary sinus tenderness or frontal sinus tenderness.     Left Sinus: No maxillary sinus tenderness or frontal sinus tenderness.     Mouth/Throat:     Pharynx: Uvula midline. No posterior oropharyngeal erythema.  Eyes:     General: Lids are normal.     Conjunctiva/sclera: Conjunctivae normal.  Neck:     Trachea: Trachea normal.  Cardiovascular:     Rate and Rhythm: Normal rate and regular rhythm.     Heart sounds: Normal heart sounds, S1 normal and S2 normal.  Pulmonary:     Effort: Pulmonary effort is normal.     Breath sounds:  Normal breath sounds. No decreased breath sounds, wheezing, rhonchi or rales.  Lymphadenopathy:     Cervical: No cervical adenopathy.  Skin:    General: Skin is warm and dry.  Neurological:     Mental Status: He is alert.  Psychiatric:        Speech: Speech normal.        Behavior: Behavior normal. Behavior is cooperative.     Assessment and Plan:   Acute cough No red flags on exam.  Will  initiate Augmentin for sinusitis per orders. Discussed taking medications as prescribed. Reviewed return precautions including worsening fever, SOB, worsening cough or other concerns. Push fluids and rest. I recommend that patient follow-up if symptoms worsen or persist despite treatment x 7-10 days, sooner if needed.   I,Rachel Rivera,acting as a Neurosurgeon for [employer], PA.,have documented all relevant documentation on the behalf of Jarold Motto, PA,as directed by  Jarold Motto, PA while in the presence of Jarold Motto, [place].  I, Jarold Motto, [place], have reviewed all documentation for this visit. The documentation on 12/18/22 for the exam, diagnosis, procedures, and orders are all accurate and complete.   Jarold Motto, PA-C

## 2022-12-18 NOTE — Patient Instructions (Addendum)
It was great to see you!  Start Augmentin antibiotic  Push fluids and get plenty of rest. Please return if you are not improving as expected, or if you have high fevers (>101.5) or difficulty swallowing or worsening productive cough.  Call clinic with questions.  I hope you start feeling better soon!   Take care,  Jarold Motto PA-C

## 2023-01-13 ENCOUNTER — Encounter: Payer: Self-pay | Admitting: Family Medicine

## 2023-01-15 ENCOUNTER — Ambulatory Visit (INDEPENDENT_AMBULATORY_CARE_PROVIDER_SITE_OTHER): Payer: Medicare HMO | Admitting: Family Medicine

## 2023-01-15 ENCOUNTER — Encounter: Payer: Self-pay | Admitting: Family Medicine

## 2023-01-15 VITALS — BP 132/68 | HR 63 | Temp 97.8°F | Ht 75.0 in | Wt 202.8 lb

## 2023-01-15 DIAGNOSIS — I10 Essential (primary) hypertension: Secondary | ICD-10-CM

## 2023-01-15 DIAGNOSIS — I4821 Permanent atrial fibrillation: Secondary | ICD-10-CM

## 2023-01-15 DIAGNOSIS — E785 Hyperlipidemia, unspecified: Secondary | ICD-10-CM | POA: Diagnosis not present

## 2023-01-15 MED ORDER — ROSUVASTATIN CALCIUM 10 MG PO TABS
10.0000 mg | ORAL_TABLET | Freq: Every day | ORAL | 5 refills | Status: DC
Start: 2023-01-15 — End: 2023-09-08

## 2023-01-15 NOTE — Patient Instructions (Addendum)
Let us know if you get any covid vaccines this fall.  -3 week trial off atorvastatin/all cholesterol medicines- update me with how you are feeling at that time through mychart - I think with mental fogginess regardless its a good idea to switch- lets try rosuvastatin/crestor 10 mg daily (but do not start this until we discuss) - we can also tweak the rosuvastatin dose later if needed- lets stay in close contact  Consider compression sleeve for elbow  Recommended follow up: Return for next already scheduled visit or sooner if needed.

## 2023-01-15 NOTE — Progress Notes (Signed)
Phone [phone] In person visit   Subjective:   Elijah [name] is a 82 y.o. year old very pleasant male patient who presents for/with See problem oriented charting Chief Complaint  Patient presents with   Hyperlipidemia    Pt has been falling more since being on Atorvastatin    Past Medical History-  Patient Active Problem List   Diagnosis Date Noted   History of CVA (cerebrovascular accident) 05/14/2016    Priority: High   History of melanoma 12/04/2015    Priority: High   Atrial fibrillation (HCC) 04/06/2007    Priority: High   Hyperglycemia 08/13/2016    Priority: Medium    Thyroid nodule     Priority: Medium    GERD (gastroesophageal reflux disease) 12/04/2015    Priority: Medium    CKD (chronic kidney disease), stage III (HCC) 12/07/2014    Priority: Medium    History of prostate cancer 09/23/2012    Priority: Medium    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    Priority: Medium    Essential hypertension 04/07/2007    Priority: Medium    Hyperlipidemia 04/06/2007    Priority: Medium    Double vision 12/14/2017    Priority: Low   Gross hematuria 02/12/2017    Priority: Low   Osteoarthritis of left hip 08/09/2014    Priority: Low   Actinic keratosis 06/05/2014    Priority: Low   Hx of adenomatous colonic polyps 06/05/2011    Priority: Low   Urinary and fecal incontinence 10/14/2021   Spinal stenosis of lumbar region 09/23/2021    Medications- reviewed and updated Current Outpatient Medications  Medication Sig Dispense Refill   acetaminophen (TYLENOL) 650 MG CR tablet Take 650 mg by mouth 2 (two) times daily.     cholecalciferol (VITAMIN D3) 25 MCG (1000 UT) tablet Take 1,000 Units by mouth 2 (two) times daily.     famotidine (PEPCID) 20 MG tablet TAKE 1 TABLET(20 MG) BY MOUTH TWICE DAILY 180 tablet 3   metoprolol succinate (TOPROL-XL) 25 MG 24 hr tablet TAKE 1 TABLET BY MOUTH DAILY WITH OR IMMEDIATELY FOLLOWING A MEAL 90 tablet 3    Multiple Vitamin (MULTIVITAMIN WITH MINERALS) TABS tablet Take 1 tablet by mouth daily.     rosuvastatin (CRESTOR) 10 MG tablet Take 1 tablet (10 mg total) by mouth daily. 30 tablet 5   XARELTO 20 MG TABS tablet TAKE 1 TABLET(20 MG) BY MOUTH DAILY 90 tablet 1   No current facility-administered medications for this visit.     Objective:  BP 132/68   Pulse 63   Temp 97.8 F (36.6 C)   Ht 6\' 3"  (1.905 m)   Wt 202 lb 12.8 oz (92 kg)   SpO2 97%   BMI 25.35 kg/m  Gen: NAD, resting comfortably CV: irregularly irregular  Lungs: CTAB no crackles, wheeze, rhonchi Ext: Trace edema Skin: warm, dry Neuro: 5 out of 5 muscle strength bilateral lower extremities    Assessment and Plan   # Potential hip replacement-he is meeting with Dr. Lequita Halt about potential hip surgery in October - osteoarthritis in both hips with ongoing discomfort and neuropathy discomfort- not sure what is bothering him most.  He is undecided on this but is leaning towards moving forward.  Able to workout at the gym without chest pain or shortness of breath but would still have cardiology weigh in on this decision   #Atrial fibrillation S: Patient compliant with metoprolol 25 mg extended release for rate control.  Also  uses Xarelto 20 mg for anticoagulation. A/P: appropriately anticoagulated and rate controlled- continue current medicine    #Hyperlipidemia/history of stroke with LDL goal under 70 though likely was embolic S: Compliant with atorvastin 20 mg up from simvastatin.  LDL most recently at goal under 70.  He is concerned though because he feels that his leg muscles are much weaker on the medication.  He fell a couple weeks ago and felt like related to the loss of leg strength- feels like left is worse than right.  Occurred after working out at [employer] and playing some flowers at home.  No syncope and no loss of consciousness.  He also feels like his balance is worse. Laid in grass for a minute until a neighbor came  down and helped- was fine after that.  - had a similar fall sometime between October and now after starting atorvastatin 20 mg - leg weakness sensation more pronounced in last few months but possibly since statin started -also perhaps mild fogginess mentally on medicine  Lab Results  Component Value Date   CHOL 118 11/18/2022   HDL 41.40 11/18/2022   LDLCALC 58 11/18/2022   LDLDIRECT 83 05/02/2022   TRIG 96.0 11/18/2022   CHOLHDL 3 11/18/2022  A/P: cholesterol at goal but side effects on atorvastatin -3 week trial off atorvastatin/all cholesterol medicines- update me with how you are feeling at that time through mychart - I think with mental fogginess regardless its a good idea to switch- lets try rosuvastatin 10 mg daily (but do not start this until we discuss)  -On right elbow appears to have developed olecranon bursitis from recent fall as well-discussed conservative measures for this-does not appear infected     #Hypertension S: Compliant with metoprolol though primarily for rate control BP Readings from Last 3 Encounters:  01/15/23 132/68  12/18/22 134/74  11/18/22 124/64  A/P: well controlled considering age- continue current medications  Recommended follow up: Return for next already scheduled visit or sooner if needed. Future Appointments  Date Time Provider Department Center  03/12/2023 11:30 AM Reather Littler, MD [facility] None  04/29/2023 10:20 AM Shelva Majestic, MD [facility]    Lab/Order associations:   ICD-10-CM   1. Hyperlipidemia, unspecified hyperlipidemia type  E78.5     2. Permanent atrial fibrillation (HCC)  I48.21     3. Essential hypertension  I10       Meds ordered this encounter  Medications   rosuvastatin (CRESTOR) 10 MG tablet    Sig: Take 1 tablet (10 mg total) by mouth daily.    Dispense:  30 tablet    Refill:  5    Return precautions advised.  Tana Conch, MD

## 2023-02-04 ENCOUNTER — Telehealth: Payer: Self-pay | Admitting: *Deleted

## 2023-02-04 ENCOUNTER — Encounter: Payer: Self-pay | Admitting: Family Medicine

## 2023-02-04 NOTE — Telephone Encounter (Signed)
Please advise holding Xarelto prior to right total hip arthroplasty scheduled for 05/18/2023.  Thank you!  DW

## 2023-02-04 NOTE — Telephone Encounter (Signed)
   Pre-operative Risk Assessment    Patient Name: Elijah [name]  DOB: 12/26/1940 MRN: [id number]      Request for Surgical Clearance    Procedure:   RIGHT TOTAL HIP ARTHROPLASTY  Date of Surgery:  Clearance 05/18/23                                 Surgeon:  DR. Ollen Gross Surgeon's Group or Practice Name:  [facility] Phone number:  [phone] ATTN: [name] Fax number:  [phone]   Type of Clearance Requested:   - Medical  - Pharmacy:  Hold Rivaroxaban (Xarelto)     Type of Anesthesia:   CHOICE   Additional requests/questions:    [name]   02/04/2023, 9:00 AM

## 2023-02-06 ENCOUNTER — Telehealth: Payer: Self-pay | Admitting: *Deleted

## 2023-02-06 NOTE — Telephone Encounter (Signed)
Lvm to call clinic back to set up clearance appointment

## 2023-02-06 NOTE — Telephone Encounter (Signed)
PATIENT OUT OF TOWN AT MOMENT WILL CALL ON 02-13-23 OR AFTER RETURN FORM TRIP TO MAKE APPOINTMENT

## 2023-02-06 NOTE — Telephone Encounter (Signed)
Patient with diagnosis of afib on Xarelto for anticoagulation.    Procedure: right THA Date of procedure: 05/18/23  CHA2DS2-VASc Score = 5  This indicates a 7.2% annual risk of stroke. The patient's score is based upon: CHF History: 0 HTN History: 1 Diabetes History: 0 Stroke History: 2 Vascular Disease History: 0 Age Score: 2 Gender Score: 0  CVA 2017.  CrCl 19mL/min Platelet count 227K  Per office protocol, patient can hold Xarelto for 3 days prior to procedure. Resume as soon as safely possible after given elevated CV risk.  **This guidance is not considered finalized until pre-operative APP has relayed final recommendations.**

## 2023-02-06 NOTE — Telephone Encounter (Signed)
   Name: Elijah [name]  DOB: 1940/12/25  MRN: [id number]  Primary Cardiologist: None   Preoperative team, please contact this patient and set up a phone call appointment for further preoperative risk assessment. Please obtain consent and complete medication review. Thank you for your help.  I confirm that guidance regarding antiplatelet and oral anticoagulation therapy has been completed and, if necessary, noted below.  Per pharm D, may hold Xarelto for 3 days prior to procedure. Resume as soon as safely possible after given elevated CV risk.    Carlos Levering, NP 02/06/2023, 11:11 AM [facility]

## 2023-02-06 NOTE — Telephone Encounter (Signed)
  Patient Consent for Virtual Visit   PATIENT OUT OF TOWN AT MOMENT WILL CALL ON 02-13-23 OR AFTER RETURN FORM TRIP TO MAKE APPOINTMENT       Elijah [name] has provided verbal consent on 02/06/2023 for a virtual visit (video or telephone).   CONSENT FOR VIRTUAL VISIT FOR:  Elijah [name]  By participating in this virtual visit I agree to the following:  I hereby voluntarily request, consent and authorize [facility] and its employed or contracted physicians, physician assistants, nurse practitioners or other licensed health care professionals (the Practitioner), to provide me with telemedicine health care services (the "Services") as deemed necessary by the treating Practitioner. I acknowledge and consent to receive the Services by the Practitioner via telemedicine. I understand that the telemedicine visit will involve communicating with the Practitioner through live audiovisual communication technology and the disclosure of certain medical information by electronic transmission. I acknowledge that I have been given the opportunity to request an in-person assessment or other available alternative prior to the telemedicine visit and am voluntarily participating in the telemedicine visit.  I understand that I have the right to withhold or withdraw my consent to the use of telemedicine in the course of my care at any time, without affecting my right to future care or treatment, and that the Practitioner or I may terminate the telemedicine visit at any time. I understand that I have the right to inspect all information obtained and/or recorded in the course of the telemedicine visit and may receive copies of available information for a reasonable fee.  I understand that some of the potential risks of receiving the Services via telemedicine include:  Delay or interruption in medical evaluation due to technological equipment failure or disruption; Information transmitted may not be sufficient (e.g. poor  resolution of images) to allow for appropriate medical decision making by the Practitioner; and/or  In rare instances, security protocols could fail, causing a breach of personal health information.  Furthermore, I acknowledge that it is my responsibility to provide information about my medical history, conditions and care that is complete and accurate to the best of my ability. I acknowledge that Practitioner's advice, recommendations, and/or decision may be based on factors not within their control, such as incomplete or inaccurate data provided by me or distortions of diagnostic images or specimens that may result from electronic transmissions. I understand that the practice of medicine is not an exact science and that Practitioner makes no warranties or guarantees regarding treatment outcomes. I acknowledge that a copy of this consent can be made available to me via my patient portal [facility] MyChart), or I can request a printed copy by calling the office of [facility].    I understand that my insurance will be billed for this visit.   I have read or had this consent read to me. I understand the contents of this consent, which adequately explains the benefits and risks of the Services being provided via telemedicine.  I have been provided ample opportunity to ask questions regarding this consent and the Services and have had my questions answered to my satisfaction. I give my informed consent for the services to be provided through the use of telemedicine in my medical care

## 2023-02-16 ENCOUNTER — Telehealth: Payer: Self-pay | Admitting: *Deleted

## 2023-02-16 NOTE — Telephone Encounter (Signed)
Pt has been scheduled for tele pre op appt 04/20/23 @ 10 am. Med rec and consent are done.     Patient Consent for Virtual Visit        Elijah [name] has provided verbal consent on 02/16/2023 for a virtual visit (video or telephone).   CONSENT FOR VIRTUAL VISIT FOR:  Elijah [name]  By participating in this virtual visit I agree to the following:  I hereby voluntarily request, consent and authorize [facility] and its employed or contracted physicians, physician assistants, nurse practitioners or other licensed health care professionals (the Practitioner), to provide me with telemedicine health care services (the "Services") as deemed necessary by the treating Practitioner. I acknowledge and consent to receive the Services by the Practitioner via telemedicine. I understand that the telemedicine visit will involve communicating with the Practitioner through live audiovisual communication technology and the disclosure of certain medical information by electronic transmission. I acknowledge that I have been given the opportunity to request an in-person assessment or other available alternative prior to the telemedicine visit and am voluntarily participating in the telemedicine visit.  I understand that I have the right to withhold or withdraw my consent to the use of telemedicine in the course of my care at any time, without affecting my right to future care or treatment, and that the Practitioner or I may terminate the telemedicine visit at any time. I understand that I have the right to inspect all information obtained and/or recorded in the course of the telemedicine visit and may receive copies of available information for a reasonable fee.  I understand that some of the potential risks of receiving the Services via telemedicine include:  Delay or interruption in medical evaluation due to technological equipment failure or disruption; Information transmitted may not be sufficient (e.g. poor  resolution of images) to allow for appropriate medical decision making by the Practitioner; and/or  In rare instances, security protocols could fail, causing a breach of personal health information.  Furthermore, I acknowledge that it is my responsibility to provide information about my medical history, conditions and care that is complete and accurate to the best of my ability. I acknowledge that Practitioner's advice, recommendations, and/or decision may be based on factors not within their control, such as incomplete or inaccurate data provided by me or distortions of diagnostic images or specimens that may result from electronic transmissions. I understand that the practice of medicine is not an exact science and that Practitioner makes no warranties or guarantees regarding treatment outcomes. I acknowledge that a copy of this consent can be made available to me via my patient portal [facility] MyChart), or I can request a printed copy by calling the office of  [facility].    I understand that my insurance will be billed for this visit.   I have read or had this consent read to me. I understand the contents of this consent, which adequately explains the benefits and risks of the Services being provided via telemedicine.  I have been provided ample opportunity to ask questions regarding this consent and the Services and have had my questions answered to my satisfaction. I give my informed consent for the services to be provided through the use of telemedicine in my medical care

## 2023-02-16 NOTE — Telephone Encounter (Signed)
Elijah [name], Phillip7 minutes ago (9:50 AM)   Patient called back to reschedule his televist from pre-op clearance      Note     Pt has been scheduled for tele pre op appt 04/20/23 @ 10 am. Med rec and consent are done.

## 2023-02-16 NOTE — Telephone Encounter (Signed)
Patient called back to reschedule his televist from pre-op clearance

## 2023-03-12 ENCOUNTER — Ambulatory Visit: Payer: Medicare Other | Admitting: Endocrinology

## 2023-03-19 ENCOUNTER — Encounter (INDEPENDENT_AMBULATORY_CARE_PROVIDER_SITE_OTHER): Payer: Self-pay

## 2023-03-24 ENCOUNTER — Other Ambulatory Visit: Payer: Self-pay | Admitting: Family Medicine

## 2023-03-31 ENCOUNTER — Other Ambulatory Visit: Payer: Self-pay | Admitting: Family Medicine

## 2023-03-31 ENCOUNTER — Other Ambulatory Visit: Payer: Self-pay

## 2023-03-31 MED ORDER — FAMOTIDINE 20 MG PO TABS: ORAL_TABLET | ORAL | 3 refills | Status: DC

## 2023-04-13 NOTE — Telephone Encounter (Signed)
Patient called stating pharmacy informed him refill of metoprolol succinate 25 mg has been denied. I'm not seeing this, can this be confirmed?

## 2023-04-15 ENCOUNTER — Other Ambulatory Visit: Payer: Self-pay

## 2023-04-15 ENCOUNTER — Other Ambulatory Visit: Payer: Self-pay | Admitting: Family Medicine

## 2023-04-15 MED ORDER — METOPROLOL SUCCINATE ER 25 MG PO TB24: ORAL_TABLET | ORAL | 3 refills | Status: DC

## 2023-04-16 ENCOUNTER — Encounter: Payer: Self-pay | Admitting: Family Medicine

## 2023-04-16 ENCOUNTER — Ambulatory Visit (INDEPENDENT_AMBULATORY_CARE_PROVIDER_SITE_OTHER): Payer: Medicare HMO | Admitting: Family Medicine

## 2023-04-16 VITALS — BP 122/70 | HR 67 | Temp 98.7°F | Ht 75.0 in | Wt 210.0 lb

## 2023-04-16 DIAGNOSIS — I10 Essential (primary) hypertension: Secondary | ICD-10-CM

## 2023-04-16 DIAGNOSIS — R739 Hyperglycemia, unspecified: Secondary | ICD-10-CM | POA: Diagnosis not present

## 2023-04-16 DIAGNOSIS — N183 Chronic kidney disease, stage 3 unspecified: Secondary | ICD-10-CM

## 2023-04-16 DIAGNOSIS — E785 Hyperlipidemia, unspecified: Secondary | ICD-10-CM | POA: Diagnosis not present

## 2023-04-16 DIAGNOSIS — Z131 Encounter for screening for diabetes mellitus: Secondary | ICD-10-CM | POA: Diagnosis not present

## 2023-04-16 DIAGNOSIS — I4821 Permanent atrial fibrillation: Secondary | ICD-10-CM | POA: Diagnosis not present

## 2023-04-16 LAB — COMPREHENSIVE METABOLIC PANEL
ALT: 21 U/L (ref 0–53)
AST: 19 U/L (ref 0–37)
Albumin: 3.7 g/dL (ref 3.5–5.2)
Alkaline Phosphatase: 88 U/L (ref 39–117)
BUN: 24 mg/dL — ABNORMAL HIGH (ref 6–23)
CO2: 27 meq/L (ref 19–32)
Calcium: 9.7 mg/dL (ref 8.4–10.5)
Chloride: 107 meq/L (ref 96–112)
Creatinine, Ser: 1.43 mg/dL (ref 0.40–1.50)
GFR: 45.87 mL/min — ABNORMAL LOW (ref >60.00)
Glucose, Bld: 87 mg/dL (ref 70–99)
Potassium: 5.1 meq/L (ref 3.5–5.1)
Sodium: 140 meq/L (ref 135–145)
Total Bilirubin: 0.7 mg/dL (ref 0.2–1.2)
Total Protein: 6.4 g/dL (ref 6.0–8.3)

## 2023-04-16 LAB — CBC WITH DIFFERENTIAL/PLATELET
Basophils Absolute: 0.1 10*3/uL (ref 0.0–0.1)
Basophils Relative: 1.2 % (ref 0.0–3.0)
Eosinophils Absolute: 0.2 10*3/uL (ref 0.0–0.7)
Eosinophils Relative: 2.6 % (ref 0.0–5.0)
HCT: 43.7 % (ref 39.0–52.0)
Hemoglobin: 14.2 g/dL (ref 13.0–17.0)
Lymphocytes Relative: 14 % (ref 12.0–46.0)
Lymphs Abs: 1.1 10*3/uL (ref 0.7–4.0)
MCHC: 32.5 g/dL (ref 30.0–36.0)
MCV: 93.8 fl (ref 78.0–100.0)
Monocytes Absolute: 0.8 10*3/uL (ref 0.1–1.0)
Monocytes Relative: 10 % (ref 3.0–12.0)
Neutro Abs: 5.9 10*3/uL (ref 1.4–7.7)
Neutrophils Relative %: 72.2 % (ref 43.0–77.0)
Platelets: 185 10*3/uL (ref 150.0–400.0)
RBC: 4.66 Mil/uL (ref 4.22–5.81)
RDW: 14.6 % (ref 11.5–15.5)
WBC: 8.2 10*3/uL (ref 4.0–10.5)

## 2023-04-16 LAB — LDL CHOLESTEROL, DIRECT: Direct LDL: 65 mg/dL

## 2023-04-16 LAB — HEMOGLOBIN A1C: Hgb A1c MFr Bld: 6.2 % (ref 4.6–6.5)

## 2023-04-16 NOTE — Progress Notes (Signed)
Phone [phone] In person visit   Subjective:   Elijah [name] is a 82 y.o. year old very pleasant male patient who presents for/with See problem oriented charting Chief Complaint  Patient presents with   Medical Management of Chronic Issues   Hyperlipidemia    Pt c/o muscle soreness and tirendess on Statin.   Hypertension   Past Medical History-  Patient Active Problem List   Diagnosis Date Noted   History of CVA (cerebrovascular accident) 05/14/2016    Priority: High   History of melanoma 12/04/2015    Priority: High   Atrial fibrillation (HCC) 04/06/2007    Priority: High   Hyperglycemia 08/13/2016    Priority: Medium    Thyroid nodule     Priority: Medium    GERD (gastroesophageal reflux disease) 12/04/2015    Priority: Medium    CKD (chronic kidney disease), stage III (HCC) 12/07/2014    Priority: Medium    History of prostate cancer 09/23/2012    Priority: Medium    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    Priority: Medium    Essential hypertension 04/07/2007    Priority: Medium    Hyperlipidemia 04/06/2007    Priority: Medium    Double vision 12/14/2017    Priority: Low   Gross hematuria 02/12/2017    Priority: Low   Osteoarthritis of left hip 08/09/2014    Priority: Low   Actinic keratosis 06/05/2014    Priority: Low   Hx of adenomatous colonic polyps 06/05/2011    Priority: Low   Urinary and fecal incontinence 10/14/2021   Spinal stenosis of lumbar region 09/23/2021    Medications- reviewed and updated Current Outpatient Medications  Medication Sig Dispense Refill   acetaminophen (TYLENOL) 650 MG CR tablet Take 650 mg by mouth 2 (two) times daily.     cholecalciferol (VITAMIN D3) 25 MCG (1000 UT) tablet Take 1,000 Units by mouth daily.     famotidine (PEPCID) 20 MG tablet TAKE 1 TABLET(20 MG) BY MOUTH TWICE DAILY 180 tablet 3   metoprolol succinate (TOPROL-XL) 25 MG 24 hr tablet TAKE 1 TABLET BY MOUTH DAILY WITH OR IMMEDIATELY  FOLLOWING A MEAL 90 tablet 3   Multiple Vitamin (MULTIVITAMIN WITH MINERALS) TABS tablet Take 1 tablet by mouth daily.     rosuvastatin (CRESTOR) 10 MG tablet Take 1 tablet (10 mg total) by mouth daily. 30 tablet 5   XARELTO 20 MG TABS tablet TAKE 1 TABLET(20 MG) BY MOUTH DAILY 90 tablet 1   No current facility-administered medications for this visit.     Objective:  BP 122/70   Pulse 67   Temp 98.7 F (37.1 C)   Ht 6\' 3"  (1.905 m)   Wt 210 lb (95.3 kg)   SpO2 98%   BMI 26.25 kg/m  Gen: NAD, resting comfortably No carotid bruits CV: RRR no murmurs rubs or gallops Lungs: CTAB no crackles, wheeze, rhonchi Abdomen: soft/nontender/nondistended/normal bowel sounds. No rebound or guarding.  Ext: trace edema, olecranon bursa swollen on right but nontender Skin: warm, dry     Assessment and Plan   #right total hip replacement- planned 05/18/23- has had weakness due to pain - some pain down legs with prolonged sitting- could be related of the prior back issues- still not at 100% -also has idiopathic neuropathy- does not want to use medication    #Atrial fibrillation- sees Dr. Nelly Laurence S: Patient compliant with metoprolol 25 mg extended release for rate control.  Also uses Xarelto 20 mg for anticoagulation. A/P:  appropriately anticoagulated and rate controlled- continue current medicine   -Have to monitor GFR and if consistently below 50 may reduce Xarelto to 15 mg   #Hyperlipidemia/history of stroke with LDL goal under 70 though likely was embolic S: Compliant with  rosuvastatin 10 mg in 2024- soreness and tiredness -atorvastatin 20 mg leg weakness and mental fog -simvastatin lipids above goal Lab Results  Component Value Date   CHOL 118 11/18/2022   HDL 41.40 11/18/2022   LDLCALC 58 11/18/2022   LDLDIRECT 83 05/02/2022   TRIG 96.0 11/18/2022   CHOLHDL 3 11/18/2022  A/P: lipids have been well controlled most recently- but now on rosuvastatin 10 mg- update LDL- hed like to try  5 mg dose if possible as he has side effects on all statins. Mentioned reducing and adding zetia but hed prefer to just try to reduce- in that case likely wait at least a month out form surgery      #Hypertension S: Compliant with metoprolol though primarily for rate control BP Readings from Last 3 Encounters:  04/16/23 122/70  01/15/23 132/68  12/18/22 134/74  A/P: stable- continue current medicines      #CKD stage III S: GFR has been in the 50s.  Patient knows to avoid NSAIDs-also particularly with anticoagulation.  A/P: hopefully stable- update cmp today. Continue without meds for now    # Hyperglycemia/insulin resistance/prediabetes- a1c up to 6.2 S:  Medication: none Exercise and diet- still getting to [facility]- leg exercises harder with hip/back issues and neuropathy. Feels could cut back on portions plus not as active in the yard Wt Readings from Last 3 Encounters:  04/16/23 210 lb (95.3 kg)  01/15/23 202 lb 12.8 oz (92 kg)  12/18/22 206 lb 6 oz (93.6 kg)  A/P: hopefully stable and not worsened- update a1c today. Continue current meds for now   # GERD- famotidine helpful. Better choice with CKD Stage III than PPI   #hematuria- referred to urology 11/18/22 - CT and cystoscopy reassuring with Dr. Laverle Patter on 12/12/22 note  #olecranon bursa still swollen- no pain or redness- wants to monitor. Wife thinks slightly better. If worsens agrees to referral to sports medicine or orthopedic   #did have another fall a following with weeks ago- feels he has weakness in legs from statin and or pain- recommended cane or walker but he declines and we will try to reduce dose if possible  Recommended follow up: Return in about 6 months (around 10/14/2023) for physical or sooner if needed.Schedule b4 you leave. Future Appointments  Date Time Provider Department Center  04/20/2023 10:00 AM [facility] APP [id number]    Lab/Order associations:   ICD-10-CM   1. Essential  hypertension  I10 CBC with Differential/Platelet    2. Hyperlipidemia, unspecified hyperlipidemia type  E78.5 Comprehensive metabolic panel    LDL cholesterol, direct    CBC with Differential/Platelet    3. Permanent atrial fibrillation (HCC)  I48.21     4. Hyperglycemia  R73.9 Hemoglobin A1c    5. Screening for diabetes mellitus  Z13.1 Hemoglobin A1c    6. Stage 3 chronic kidney disease, unspecified whether stage 3a or 3b CKD (HCC)  N18.30       No orders of the defined types were placed in this encounter.   Return precautions advised.  Tana Conch, MD

## 2023-04-16 NOTE — Patient Instructions (Addendum)
Health Maintenance Due  Topic Date Due   Medicare Annual Wellness (AWV)  09/13/2020  You are eligible to schedule your annual wellness visit with our nurse specialist Inetta Fermo.  Please consider scheduling this before you leave today  Please stop by lab before you go If you have mychart- we will send your results within 3 business days of [place] receiving them.  If you do not have mychart- we will call you about results within 5 business days of [place] receiving them.  *please also note that you will see labs on mychart as soon as they post. I will later go in and write notes on them- will say "notes from Dr. Durene Cal"   Recommended follow up: Return in about 6 months (around 10/14/2023) for physical or sooner if needed.Schedule b4 you leave.

## 2023-04-20 ENCOUNTER — Ambulatory Visit: Payer: Medicare HMO | Attending: Physician Assistant

## 2023-04-20 DIAGNOSIS — Z01810 Encounter for preprocedural cardiovascular examination: Secondary | ICD-10-CM | POA: Diagnosis not present

## 2023-04-20 NOTE — Progress Notes (Signed)
Virtual Visit via Telephone Note   Because of Elijah [name] co-morbid illnesses, he is at least at moderate risk for complications without adequate follow up.  This format is felt to be most appropriate for this patient at this time.  The patient did not have access to video technology/had technical difficulties with video requiring transitioning to audio format only (telephone).  All issues noted in this document were discussed and addressed.  No physical exam could be performed with this format.  Please refer to the patient's chart for his consent to telehealth for [facility].  Evaluation Performed:  Preoperative cardiovascular risk assessment _____________   Date:  04/20/2023   Patient ID:  Elijah [name], DOB 1940/08/15, MRN [id number] Patient Location:  Home Provider location:   Office  Primary Care Provider:  Shelva Majestic, MD Primary Cardiologist:  None  Chief Complaint / Patient Profile   82 y.o. y/o male with a h/o permanent AF, HTN who is pending right total hip surgery and presents today for telephonic preoperative cardiovascular risk assessment.  History of Present Illness    Elijah [name] is a 82 y.o. male who presents via audio/video conferencing for a telehealth visit today.  Pt was last seen in cardiology clinic on 06/03/22 by Dr. Nelly Laurence.  At that time Dometrius Dufour was doing well . The patient is now pending procedure as outlined above. Since his last visit, he has no symptoms with the Afib for the 20 some years he has been in it. He goes to the [facility] everyday and his hip is getting a little a worse. Dusting and vacuuming is a non-issue. He does some gardening but no lawn care since he lives in a condo.  Per office protocol, patient can hold Xarelto for 3 days prior to procedure. Resume as soon as safely possible after given elevated CV risk.    Past Medical History    Past Medical History:  Diagnosis Date   Adenomatous colon polyp    Arthritis 09/10/2011    rt. hip, knee, hands   Cancer (HCC) 09/10/2011   dx. Prostate cancer-bx. 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   Peripheral neuropathy    Permanent atrial fibrillation (HCC) 09/10/2011   Pre-diabetes    Prostate cancer (HCC)    Stroke [facility])    Past Surgical History:  Procedure Laterality Date   COLONOSCOPY  04/2003, 06/05/2011   diverticulosis, internal and external hemorrhoids 2004 and 2012, 2 small polyps 2012   EYE SURGERY  09/10/2011   Only stitches to close cut, and observation of hematoma from injury, cataracts bilateral   INGUINAL HERNIA REPAIR Right 06/16/2019   Procedure: OPEN RIGHT INGUINAL HERNIA REPAIR;  Surgeon: Ovidio Kin, MD;  Location: [facility] OR;  Service: General;  Laterality: Right;  OPEN RIGHT INGUINAL HERNIA REPAIR   LUMBAR LAMINECTOMY/DECOMPRESSION MICRODISCECTOMY N/A 09/23/2021   Procedure: Laminectomy and Foraminotomy - Lumbar Two- Lumbar Three - Lumbar Three- Lumbar Four - Lumbar Four- Lumbar Five;  Surgeon: Donalee Citrin, MD;  Location: [facility] OR;  Service: Neurosurgery;  Laterality: N/A;  Laminectomy and Foraminotomy - Lumbar Two- Lumbar Three - Lumbar Three- Lumbar Four - Lumbar Four-  Lumbar Five   ROBOT ASSISTED LAPAROSCOPIC RADICAL PROSTATECTOMY  09/15/2011   Procedure: ROBOTIC ASSISTED LAPAROSCOPIC RADICAL PROSTATECTOMY LEVEL 2;  Surgeon: Crecencio Mc, MD;  Location: [facility] ORS;  Service: Urology;  Laterality: N/A;         TONSILLECTOMY      Allergies  Allergies  Allergen Reactions   Atorvastatin     Mental fogginess. Perceived leg weakness    Ciprofloxacin Nausea Only   Flagyl [Metronidazole] Nausea Only    Home Medications    Prior to Admission medications   Medication  Sig Start Date End Date Taking? Authorizing Provider  acetaminophen (TYLENOL) 650 MG CR tablet Take 650 mg by mouth 2 (two) times daily.    [provider]  cholecalciferol (VITAMIN D3) 25 MCG (1000 UT) tablet Take 1,000 Units by mouth daily.    [provider]  famotidine (PEPCID) 20 MG tablet TAKE 1 TABLET(20 MG) BY MOUTH TWICE DAILY 04/13/23   Shelva Majestic, MD  metoprolol succinate (TOPROL-XL) 25 MG 24 hr tablet TAKE 1 TABLET BY MOUTH DAILY WITH OR IMMEDIATELY FOLLOWING A MEAL 04/15/23   Shelva Majestic, MD  Multiple Vitamin (MULTIVITAMIN WITH MINERALS) TABS tablet Take 1 tablet by mouth daily.    [provider]  rosuvastatin (CRESTOR) 10 MG tablet Take 1 tablet (10 mg total) by mouth daily. 01/15/23   Shelva Majestic, MD  XARELTO 20 MG TABS tablet TAKE 1 TABLET(20 MG) BY MOUTH DAILY 03/24/23   Shelva Majestic, MD    Physical Exam    Vital Signs:  Elijah [name] does not have vital signs available for review today.  Given telephonic nature of communication, physical exam is limited. AAOx3. NAD. Normal affect.  Speech and respirations are unlabored.  Accessory Clinical Findings    None  Assessment & Plan    1.  Preoperative Cardiovascular Risk Assessment:  Mr. Fatima perioperative risk of a major cardiac event is 0.4% according to the Revised Cardiac Risk Index (RCRI).  Therefore, he is at low risk for perioperative complications.   His functional capacity is good at 5.07 METs according to the [place] Activity Status Index (DASI). Recommendations: According to ACC/AHA guidelines, no further cardiovascular testing needed.  The patient may proceed to surgery at acceptable risk.   Antiplatelet and/or Anticoagulation Recommendations:  Xarelto (Rivaroxaban) can be held for 3 days prior to surgery.  Please resume post op when felt to be safe.    Time:   Today, I have spent 10 minutes with the patient with telehealth technology discussing medical history,  symptoms, and management plan.     Sharlene Dory, PA-C  04/20/2023, 10:01 AM

## 2023-04-21 NOTE — H&P (Signed)
TOTAL HIP ADMISSION H&P  Patient is admitted for right total hip arthroplasty.  Subjective:  Chief Complaint: Right hip pain  HPI: Elijah [name], 82 y.o. male, has a history of pain and functional disability in the right hip due to arthritis and patient has failed non-surgical conservative treatments for greater than 12 weeks to include NSAID's and/or analgesics, flexibility and strengthening excercises, and activity modification. Onset of symptoms was gradual, starting  several  years ago with gradually worsening course since that time. The patient noted no past surgery on the right hip. Patient currently rates pain in the right hip at 7 out of 10 with activity. Patient has night pain, worsening of pain with activity and weight bearing, trendelenberg gait, and pain that interfers with activities of daily living. Patient has evidence of  severe bone-on-bone arthritis in both hips. The patient is completely bone against bone on both hips. There are big bone spurs on both the right and left sides. The right side appears slightly worse on the radiograph, but not significantly. The patient also has an arthritic cyst on the right hip, which is likely causing additional pain  by imaging studies. This condition presents safety issues increasing the risk of falls. There is no current active infection.  Patient Active Problem List   Diagnosis Date Noted   Urinary and fecal incontinence 10/14/2021   Spinal stenosis of lumbar region 09/23/2021   Double vision 12/14/2017   Gross hematuria 02/12/2017   Hyperglycemia 08/13/2016   History of CVA (cerebrovascular accident) 05/14/2016   Thyroid nodule    History of melanoma 12/04/2015   GERD (gastroesophageal reflux disease) 12/04/2015   CKD (chronic kidney disease), stage III (HCC) 12/07/2014   Osteoarthritis of left hip 08/09/2014   Actinic keratosis 06/05/2014   History of prostate cancer 09/23/2012   Hx of adenomatous colonic polyps 06/05/2011    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   Essential hypertension 04/07/2007   Hyperlipidemia 04/06/2007   Atrial fibrillation (HCC) 04/06/2007    Past Medical History:  Diagnosis Date   Adenomatous colon polyp    Arthritis 09/10/2011   rt. hip, knee, hands   Cancer (HCC) 09/10/2011   dx. Prostate cancer-bx. 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   Peripheral neuropathy    Permanent atrial fibrillation (HCC) 09/10/2011   Pre-diabetes    Prostate cancer (HCC)    Stroke [facility])     Past Surgical History:  Procedure Laterality Date   COLONOSCOPY  04/2003, 06/05/2011   diverticulosis, internal and external hemorrhoids 2004 and 2012, 2 small polyps 2012   EYE SURGERY  09/10/2011   Only stitches to close cut, and observation of hematoma from injury, cataracts bilateral   INGUINAL HERNIA REPAIR Right 06/16/2019   Procedure: OPEN RIGHT INGUINAL HERNIA REPAIR;  Surgeon: Ovidio Kin, MD;  Location: [facility] OR;  Service: General;  Laterality: Right;  OPEN RIGHT INGUINAL HERNIA REPAIR   LUMBAR LAMINECTOMY/DECOMPRESSION MICRODISCECTOMY N/A 09/23/2021   Procedure: Laminectomy and Foraminotomy - Lumbar Two- Lumbar Three - Lumbar Three- Lumbar Four - Lumbar Four- Lumbar Five;  Surgeon: Donalee Citrin, MD;  Location: [facility] OR;  Service: Neurosurgery;  Laterality: N/A;  Laminectomy and Foraminotomy - Lumbar Two- Lumbar Three - Lumbar Three- Lumbar Four - Lumbar Four- Lumbar Five   ROBOT ASSISTED LAPAROSCOPIC RADICAL PROSTATECTOMY  09/15/2011   Procedure: ROBOTIC ASSISTED LAPAROSCOPIC RADICAL PROSTATECTOMY LEVEL 2;  Surgeon: Crecencio Mc, MD;  Location: [facility] ORS;  Service: Urology;  Laterality: N/A;          TONSILLECTOMY      Prior to Admission medications   Medication Sig Start Date End Date Taking? Authorizing Provider  acetaminophen (TYLENOL) 650 MG CR tablet Take 650 mg by mouth 2 (two) times daily.    [provider]  cholecalciferol (VITAMIN D3) 25 MCG (1000 UT) tablet Take 1,000 Units by mouth daily.    [provider]  famotidine (PEPCID) 20 MG tablet TAKE 1 TABLET(20 MG) BY MOUTH TWICE DAILY 04/13/23   Shelva Majestic, MD  metoprolol succinate (TOPROL-XL) 25 MG 24 hr tablet TAKE 1 TABLET BY MOUTH DAILY WITH OR IMMEDIATELY FOLLOWING A MEAL 04/15/23   Shelva Majestic, MD  Multiple Vitamin (MULTIVITAMIN WITH MINERALS) TABS tablet Take 1 tablet by mouth daily.    [provider]  rosuvastatin (CRESTOR) 10 MG tablet Take 1 tablet (10 mg total) by mouth daily. 01/15/23   Shelva Majestic, MD  XARELTO 20 MG TABS tablet TAKE 1 TABLET(20 MG) BY MOUTH DAILY 03/24/23   Shelva Majestic, MD    Allergies  Allergen Reactions   Atorvastatin     Mental fogginess. Perceived leg weakness    Ciprofloxacin Nausea Only   Flagyl [Metronidazole] Nausea Only    Social History   Socioeconomic History   Marital status: Married    Spouse name: Not on file   Number of children: 3   Years of education: Not on file   Highest education level: Master's degree (e.g., MA, MS, MEng, MEd, MSW, MBA)  Occupational History   Occupation: Programmer, applications: RETIRED  Tobacco Use   Smoking status: Former    Current packs/day: 0.00    Types: Cigarettes    Quit date: 08/04/1968    Years since quitting: 54.7   Smokeless tobacco: Never  Vaping Use   Vaping status: Never Used  Substance and Sexual Activity   Alcohol use: No    Comment: none in 20 yrs   Drug use: No   Sexual activity: Yes  Other Topics Concern   Not on file  Social History Narrative   Married with 3 kids. 6 grandkids.       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       Hobbies:  beach (place at [employer]) usually every 2 weeks.    Social Determinants of Health   Financial Resource Strain: Low Risk  (11/14/2022)   Overall Financial Resource Strain (CARDIA)    Difficulty of Paying Living Expenses: Not hard at all  Food Insecurity: No Food Insecurity (11/14/2022)   Hunger Vital Sign    Worried About Running Out of Food in the Last Year: Never true    Ran Out of Food in the Last Year: Never true  Transportation Needs: No Transportation Needs (11/14/2022)   PRAPARE - Administrator, Civil Service (Medical): No    Lack of Transportation (Non-Medical): No  Physical Activity: Sufficiently Active (11/14/2022)   Exercise Vital Sign    Days of Exercise per Week: 6 days    Minutes of Exercise per Session: 60 min  Stress: No Stress Concern Present (11/14/2022)   [place]  Institute of Occupational Health - Occupational Stress Questionnaire    Feeling of Stress : Only a little  Social Connections: Moderately Integrated (11/14/2022)   Social Connection and Isolation Panel [NHANES]    Frequency of Communication with Friends and Family: Three times a week    Frequency of Social Gatherings with Friends and Family: Patient declined    Attends Religious Services: More than 4 times per year    Active Member of [employer] or Organizations: No    Attends Engineer, structural: Not on file    Marital Status: Married  Catering manager Violence: Not on file    Tobacco Use: Medium Risk (04/16/2023)   Patient History    Smoking Tobacco Use: Former    Smokeless Tobacco Use: Never    Passive Exposure: Not on file   Social History   Substance and Sexual Activity  Alcohol Use No   Comment: none in 20 yrs    Family History  Problem Relation Age of Onset   Diabetes Mother    Hypertension Mother    Diverticulosis Mother    Heart attack Father 29       MI. died at 29   Prostate cancer Brother    Alzheimer's disease Paternal Grandmother    Heart attack Paternal  Grandfather    Colon cancer Neg Hx     ROS   Objective:  Physical Exam: Well nourished and well developed.  General: Alert and oriented x3, cooperative and pleasant, no acute distress.  Head: normocephalic, atraumatic, neck supple.  Eyes: EOMI. Abdomen: non-tender to palpation and soft, normoactive bowel sounds. Musculoskeletal: - Right hip can be flexed to 100 with minimal internal rotation of about 10, external rotation 20, abduction 20.  - Left hip can be flexed to 100, rotation in 20, abduction 20.  - Antalgic gait pattern on the right.  Calves soft and nontender. Motor function intact in LE. Strength 5/5 LE bilaterally. Neuro: Distal pulses 2+. Sensation to light touch intact in LE.  Vital signs in last 24 hours: BP: ()/()  Arterial Line BP: ()/()   Imaging Review Plain radiographs demonstrate severe degenerative joint disease of the right hip. The bone quality appears to be adequate for age and reported activity level.  Assessment/Plan:  End stage arthritis, right hip  The patient history, physical examination, clinical judgement of the provider and imaging studies are consistent with end stage degenerative joint disease of the right hip and total hip arthroplasty is deemed medically necessary. The treatment options including medical management, injection therapy, arthroscopy and arthroplasty were discussed at length. The risks and benefits of total hip arthroplasty were presented and reviewed. The risks due to aseptic loosening, infection, stiffness, dislocation/subluxation, thromboembolic complications and other imponderables were discussed. The patient acknowledged the explanation, agreed to proceed with the plan and consent was signed. Patient is being admitted for inpatient treatment for surgery, pain control, PT, OT, prophylactic antibiotics, VTE prophylaxis, progressive ambulation and ADLs and discharge planning.The patient is planning to be discharged  home  .  Therapy Plans: HEP Disposition: Home with Wife Planned DVT Prophylaxis: Xarelto (hx TIA and prostate cancer) DME Needed: RW PCP: Tana Conch, MD (clearance received) Cardiologist: Jari Favre, PA-C (clearance received) TXA: IV Allergies: NKDA Anesthesia Concerns: None BMI: 27.3 Last HgbA1c: not diabetic  Pharmacy: [facility] [address])  Other: -Hx TIA in 2017, prostate cancer 2013 -Per cardiology, hold Xarelto 3 days prior to surgery -Has had issue with catheters in the past  - Patient was instructed  on what medications to stop prior to surgery. - Follow-up visit in 2 weeks with Dr. Lequita Halt - Begin physical therapy following surgery - Pre-operative lab work as pre-surgical testing - Prescriptions will be provided in hospital at time of discharge  R. Arcola Jansky, PA-C Orthopedic Surgery [facility] [place]

## 2023-04-29 ENCOUNTER — Ambulatory Visit: Payer: Medicare HMO | Admitting: Family Medicine

## 2023-05-06 NOTE — Patient Instructions (Signed)
SURGICAL WAITING ROOM VISITATION  Patients having surgery or a procedure may have no more than 2 support people in the waiting area - these visitors may rotate.    Children under the age of 51 must have an adult with them who is not the patient.  Due to an increase in RSV and influenza rates and associated hospitalizations, children ages 32 and under may not visit patients in [facility] hospitals.  If the patient needs to stay at the hospital during part of their recovery, the visitor guidelines for inpatient rooms apply. Pre-op nurse will coordinate an appropriate time for 1 support person to accompany patient in pre-op.  This support person may not rotate.    Please refer to the [facility] website for the visitor guidelines for Inpatients (after your surgery is over and you are in a regular room).       Your procedure is scheduled on:  05/18/2023    Report to [facility] Main Entrance    Report to admitting at  1115 AM   Call this number if you have problems the morning of surgery [phone]   Do not eat food :After Midnight.   After Midnight you may have the following liquids until _ 1045_____ AM DAY OF SURGERY  Water Non-Citrus Juices (without pulp, NO RED-Apple, White grape, White cranberry) Black Coffee (NO MILK/CREAM OR CREAMERS, sugar ok)  Clear Tea (NO MILK/CREAM OR CREAMERS, sugar ok) regular and decaf                             Plain Jell-O (NO RED)                                           Fruit ices (not with fruit pulp, NO RED)                                     Popsicles (NO RED)                                                               Sports drinks like Gatorade (NO RED)               The day of surgery:  Drink ONE (1) Pre-Surgery Clear Ensure or G2 at    1045AM  ( have completed by ) he morning of surgery. Drink in one sitting. Do not sip.  This drink was given to you during your hospital  pre-op appointment visit. Nothing else to drink  after completing the  Pre-Surgery Clear Ensure or G2.          If you have questions, please contact your surgeon's office.   FOLLOW BOWEL PREP AND ANY ADDITIONAL PRE OP INSTRUCTIONS YOU RECEIVED FROM YOUR SURGEON'S OFFICE!!!     Oral Hygiene is also important to reduce your risk of infection.  Remember - BRUSH YOUR TEETH THE MORNING OF SURGERY WITH YOUR REGULAR TOOTHPASTE  DENTURES WILL BE REMOVED PRIOR TO SURGERY PLEASE DO NOT APPLY "Poly grip" OR ADHESIVES!!!   Do NOT smoke after Midnight   Stop all vitamins and herbal supplements 7 days before surgery.   Take these medicines the morning of surgery with A SIP OF WATER:  pepcid, toprol   DO NOT TAKE ANY ORAL DIABETIC MEDICATIONS DAY OF YOUR SURGERY  Bring CPAP mask and tubing day of surgery.                              You may not have any metal on your body including hair pins, jewelry, and body piercing             Do not wear make-up, lotions, powders, perfumes/cologne, or deodorant  Do not wear nail polish including gel and S&S, artificial/acrylic nails, or any other type of covering on natural nails including finger and toenails. If you have artificial nails, gel coating, etc. that needs to be removed by a nail salon please have this removed prior to surgery or surgery may need to be canceled/ delayed if the surgeon/ anesthesia feels like they are unable to be safely monitored.   Do not shave  48 hours prior to surgery.               Men may shave face and neck.   Do not bring valuables to the hospital. [facility] IS NOT             RESPONSIBLE   FOR VALUABLES.   Contacts, glasses, dentures or bridgework may not be worn into surgery.   Bring small overnight bag day of surgery.   DO NOT BRING YOUR HOME MEDICATIONS TO THE HOSPITAL. PHARMACY WILL DISPENSE MEDICATIONS LISTED ON YOUR MEDICATION LIST TO YOU DURING YOUR ADMISSION IN THE HOSPITAL!    Patients discharged on the day of  surgery will not be allowed to drive home.  Someone NEEDS to stay with you for the first 24 hours after anesthesia.   Special Instructions: Bring a copy of your healthcare power of attorney and living will documents the day of surgery if you haven't scanned them before.              Please read over the following fact sheets you were given: IF YOU HAVE QUESTIONS ABOUT YOUR PRE-OP INSTRUCTIONS PLEASE CALL [phone]   If you received a COVID test during your pre-op visit  it is requested that you wear a mask when out in public, stay away from anyone that may not be feeling well and notify your surgeon if you develop symptoms. If you test positive for Covid or have been in contact with anyone that has tested positive in the last 10 days please notify you surgeon.      Pre-operative 5 CHG Bath Instructions   You can play a key role in reducing the risk of infection after surgery. Your skin needs to be as free of germs as possible. You can reduce the number of germs on your skin by washing with CHG (chlorhexidine gluconate) soap before surgery. CHG is an antiseptic soap that kills germs and continues to kill germs even after washing.   DO NOT use if you have an allergy to chlorhexidine/CHG or antibacterial soaps. If your skin becomes reddened or irritated, stop using the CHG and notify one of our RNs  at [phone].   Please shower with the CHG soap starting 4 days before surgery using the following schedule:     Please keep in mind the following:  DO NOT shave, including legs and underarms, starting the day of your first shower.   You may shave your face at any point before/day of surgery.  Place clean sheets on your bed the day you start using CHG soap. Use a clean washcloth (not used since being washed) for each shower. DO NOT sleep with pets once you start using the CHG.   CHG Shower Instructions:  If you choose to wash your hair and private area, wash first with your normal  shampoo/soap.  After you use shampoo/soap, rinse your hair and body thoroughly to remove shampoo/soap residue.  Turn the water OFF and apply about 3 tablespoons (45 ml) of CHG soap to a CLEAN washcloth.  Apply CHG soap ONLY FROM YOUR NECK DOWN TO YOUR TOES (washing for 3-5 minutes)  DO NOT use CHG soap on face, private areas, open wounds, or sores.  Pay special attention to the area where your surgery is being performed.  If you are having back surgery, having someone wash your back for you may be helpful. Wait 2 minutes after CHG soap is applied, then you may rinse off the CHG soap.  Pat dry with a clean towel  Put on clean clothes/pajamas   If you choose to wear lotion, please use ONLY the CHG-compatible lotions on the back of this paper.     Additional instructions for the day of surgery: DO NOT APPLY any lotions, deodorants, cologne, or perfumes.   Put on clean/comfortable clothes.  Brush your teeth.  Ask your nurse before applying any prescription medications to the skin.      CHG Compatible Lotions   Aveeno Moisturizing lotion  Cetaphil Moisturizing Cream  Cetaphil Moisturizing Lotion  Clairol Herbal Essence Moisturizing Lotion, Dry Skin  Clairol Herbal Essence Moisturizing Lotion, Extra Dry Skin  Clairol Herbal Essence Moisturizing Lotion, Normal Skin  Curel Age Defying Therapeutic Moisturizing Lotion with Alpha Hydroxy  Curel Extreme Care Body Lotion  Curel Soothing Hands Moisturizing Hand Lotion  Curel Therapeutic Moisturizing Cream, Fragrance-Free  Curel Therapeutic Moisturizing Lotion, Fragrance-Free  Curel Therapeutic Moisturizing Lotion, Original Formula  Eucerin Daily Replenishing Lotion  Eucerin Dry Skin Therapy Plus Alpha Hydroxy Crme  Eucerin Dry Skin Therapy Plus Alpha Hydroxy Lotion  Eucerin Original Crme  Eucerin Original Lotion  Eucerin Plus Crme Eucerin Plus Lotion  Eucerin TriLipid Replenishing Lotion  Keri Anti-Bacterial Hand Lotion  Keri Deep  Conditioning Original Lotion Dry Skin Formula Softly Scented  Keri Deep Conditioning Original Lotion, Fragrance Free Sensitive Skin Formula  Keri Lotion Fast Absorbing Fragrance Free Sensitive Skin Formula  Keri Lotion Fast Absorbing Softly Scented Dry Skin Formula  Keri Original Lotion  Keri Skin Renewal Lotion Keri Silky Smooth Lotion  Keri Silky Smooth Sensitive Skin Lotion  Nivea Body Creamy Conditioning Oil  Nivea Body Extra Enriched Teacher, adult education Moisturizing Lotion Nivea Crme  Nivea Skin Firming Lotion  NutraDerm 30 Skin Lotion  NutraDerm Skin Lotion  NutraDerm Therapeutic Skin Cream  NutraDerm Therapeutic Skin Lotion  ProShield Protective Hand Cream  Provon moisturizing lotion

## 2023-05-06 NOTE — Progress Notes (Signed)
Anesthesia Review:  PCP: Tana Conch Clearance 02/18/23 on chart  Cardiologist : York Pellant- LOV 06/03/22 telephone visit for preop clearance 04/20/23 Tessa Conte,PAC  Chest x-ray : EKG : 06/03/22  Echo : Stress test: Cardiac Cath :  Activity level:  Sleep Study/ CPAP : Fasting Blood Sugar :      / Checks Blood Sugar -- times a day:   Blood Thinner/ Instructions /Last Dose: ASA / Instructions/ Last Dose :   Prediabetes- does not check glucose at home  On no meds   04/16/23-hgba1c- 6.2    Xarelto-  Last dose on 05/14/23 per pt  BMP with potassium of 5.5 on 05/07/23 routed to Dr Lequita Halt. Nelle Don aware.

## 2023-05-07 ENCOUNTER — Other Ambulatory Visit: Payer: Self-pay

## 2023-05-07 ENCOUNTER — Encounter (HOSPITAL_COMMUNITY)
Admission: RE | Admit: 2023-05-07 | Discharge: 2023-05-07 | Disposition: A | Discharge status: Home / Self Care | Payer: Medicare HMO | Source: Ambulatory Visit | Attending: Orthopedic Surgery | Admitting: Orthopedic Surgery

## 2023-05-07 ENCOUNTER — Encounter (HOSPITAL_COMMUNITY): Payer: Self-pay

## 2023-05-07 VITALS — BP 146/82 | HR 91 | Temp 98.2°F | Resp 16 | Ht 74.0 in | Wt 209.4 lb

## 2023-05-07 DIAGNOSIS — N183 Chronic kidney disease, stage 3 unspecified: Secondary | ICD-10-CM | POA: Insufficient documentation

## 2023-05-07 DIAGNOSIS — I129 Hypertensive chronic kidney disease with stage 1 through stage 4 chronic kidney disease, or unspecified chronic kidney disease: Secondary | ICD-10-CM | POA: Insufficient documentation

## 2023-05-07 DIAGNOSIS — Z7901 Long term (current) use of anticoagulants: Secondary | ICD-10-CM | POA: Insufficient documentation

## 2023-05-07 DIAGNOSIS — Z8673 Personal history of transient ischemic attack (TIA), and cerebral infarction without residual deficits: Secondary | ICD-10-CM | POA: Diagnosis not present

## 2023-05-07 DIAGNOSIS — I4821 Permanent atrial fibrillation: Secondary | ICD-10-CM | POA: Insufficient documentation

## 2023-05-07 DIAGNOSIS — Z01818 Encounter for other preprocedural examination: Secondary | ICD-10-CM

## 2023-05-07 DIAGNOSIS — M1611 Unilateral primary osteoarthritis, right hip: Secondary | ICD-10-CM | POA: Diagnosis not present

## 2023-05-07 DIAGNOSIS — Z01812 Encounter for preprocedural laboratory examination: Secondary | ICD-10-CM | POA: Insufficient documentation

## 2023-05-07 DIAGNOSIS — Z87891 Personal history of nicotine dependence: Secondary | ICD-10-CM | POA: Diagnosis not present

## 2023-05-07 LAB — BASIC METABOLIC PANEL
Anion gap: 6 (ref 5–15)
BUN: 29 mg/dL — ABNORMAL HIGH (ref 8–23)
CO2: 25 mmol/L (ref 22–32)
Calcium: 9.6 mg/dL (ref 8.9–10.3)
Chloride: 108 mmol/L (ref 98–111)
Creatinine, Ser: 1.26 mg/dL — ABNORMAL HIGH (ref 0.61–1.24)
GFR, Estimated: 57 mL/min — ABNORMAL LOW (ref >60)
Glucose, Bld: 99 mg/dL (ref 70–99)
Potassium: 5.5 mmol/L — ABNORMAL HIGH (ref 3.5–5.1)
Sodium: 139 mmol/L (ref 135–145)

## 2023-05-07 LAB — CBC
HCT: 47.3 % (ref 39.0–52.0)
Hemoglobin: 15.4 g/dL (ref 13.0–17.0)
MCH: 31.4 pg (ref 26.0–34.0)
MCHC: 32.6 g/dL (ref 30.0–36.0)
MCV: 96.5 fL (ref 80.0–100.0)
Platelets: 180 10*3/uL (ref 150–400)
RBC: 4.9 MIL/uL (ref 4.22–5.81)
RDW: 13.4 % (ref 11.5–15.5)
WBC: 8 10*3/uL (ref 4.0–10.5)
nRBC: 0 % (ref 0.0–0.2)

## 2023-05-07 LAB — SURGICAL PCR SCREEN
MRSA, PCR: NEGATIVE
Staphylococcus aureus: NEGATIVE

## 2023-05-07 LAB — TYPE AND SCREEN
ABO/RH(D): O POS
Antibody Screen: NEGATIVE

## 2023-05-11 NOTE — Progress Notes (Signed)
Anesthesia Chart Review   Case: [id number] Date/Time: 05/18/23 1330   Procedure: TOTAL HIP ARTHROPLASTY ANTERIOR APPROACH (Right: Hip)   Anesthesia type: Choice   Pre-op diagnosis: right hip osteoarthritis   Location[facility] ROOM 09 / [facility]   Surgeons: Ollen Gross, MD       DISCUSSION:82 y.o. former smoker with h/o HTN, CKD Stage III, atrial fibrillation, stroke, right hip OA scheduled for above procedure 05/18/2023 with Dr. Ollen Gross.   Per cardiology preoperative evaluation 04/20/2023, "Elijah [name]'s perioperative risk of a major cardiac event is 0.4% according to the Revised Cardiac Risk Index (RCRI).  Therefore, he is at low risk for perioperative complications.   His functional capacity is good at 5.07 METs according to the Duke Activity Status Index (DASI). Recommendations: According to ACC/AHA guidelines, no further cardiovascular testing needed.  The patient may proceed to surgery at acceptable risk.   Antiplatelet and/or Anticoagulation Recommendations:   Xarelto (Rivaroxaban) can be held for 3 days prior to surgery.  Please resume post op when felt to be safe. "  Pt reports last dose of Xarelto 05/14/2023.  VS: BP (!) 146/82   Pulse 91   Temp 36.8 C (Oral)   Resp 16   Ht 6\' 2"  (1.88 m)   Wt 95 kg   SpO2 100%   BMI 26.89 kg/m   PROVIDERS: Shelva Majestic, MD is PCP   Cardiologist : York Pellant, MD  LABS: Labs reviewed: Acceptable for surgery. (all labs ordered are listed, but only abnormal results are displayed)  Labs Reviewed  BASIC METABOLIC PANEL - Abnormal; Notable for the following components:      Result Value   Potassium 5.5 (*)    BUN 29 (*)    Creatinine, Ser 1.26 (*)    GFR, Estimated 57 (*)    All other components within normal limits  SURGICAL PCR SCREEN  CBC  TYPE AND SCREEN     IMAGES:   EKG:   CV:  Past Medical History:  Diagnosis Date   Adenomatous colon polyp    Arthritis 09/10/2011   rt. hip, knee, hands   Cancer  (HCC) 09/10/2011   dx. Prostate cancer-bx. 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   Peripheral neuropathy    Permanent atrial fibrillation (HCC) 09/10/2011   Pre-diabetes    Prostate cancer (HCC)    Stroke [facility])     Past Surgical History:  Procedure Laterality Date   COLONOSCOPY  04/2003, 06/05/2011   diverticulosis, internal and external hemorrhoids 2004 and 2012, 2 small polyps 2012   EYE SURGERY  09/10/2011   Only stitches to close cut, and observation of hematoma from injury, cataracts bilateral   INGUINAL HERNIA REPAIR Right 06/16/2019   Procedure: OPEN RIGHT INGUINAL HERNIA REPAIR;  Surgeon: Ovidio Kin, MD;  Location: [facility] OR;  Service: General;  Laterality: Right;  OPEN RIGHT INGUINAL HERNIA REPAIR   LUMBAR LAMINECTOMY/DECOMPRESSION MICRODISCECTOMY N/A 09/23/2021   Procedure: Laminectomy and Foraminotomy - Lumbar Two- Lumbar Three - Lumbar Three- Lumbar Four - Lumbar Four- Lumbar Five;  Surgeon: Donalee Citrin, MD;  Location: [facility] OR;  Service: Neurosurgery;  Laterality: N/A;  Laminectomy and  Foraminotomy - Lumbar Two- Lumbar Three - Lumbar Three- Lumbar Four - Lumbar Four- Lumbar Five   melanoma surgery      ROBOT ASSISTED LAPAROSCOPIC RADICAL PROSTATECTOMY  09/15/2011   Procedure: ROBOTIC ASSISTED LAPAROSCOPIC RADICAL PROSTATECTOMY LEVEL 2;  Surgeon: Crecencio Mc, MD;  Location: [facility];  Service: Urology;  Laterality: N/A;         TONSILLECTOMY      MEDICATIONS:  acetaminophen (TYLENOL) 650 MG CR tablet   famotidine (PEPCID) 20 MG tablet   metoprolol succinate (TOPROL-XL) 25 MG 24 hr tablet   Multiple Vitamin (MULTIVITAMIN WITH MINERALS) TABS tablet   rosuvastatin (CRESTOR) 10 MG tablet   XARELTO  20 MG TABS tablet   No current facility-administered medications for this encounter.   Elijah Cipro Ward, PA-C [facility] Pre-Surgical Testing [phone]

## 2023-05-11 NOTE — Anesthesia Preprocedure Evaluation (Addendum)
Anesthesia Evaluation  Patient identified by MRN, date of birth, ID band Patient awake    Reviewed: Allergy & Precautions, NPO status , Patient's Chart, lab work & pertinent test results  History of Anesthesia Complications Negative for: history of anesthetic complications  Airway Mallampati: I  TM Distance: >3 FB Neck ROM: Full    Dental  (+) Dental Advisory Given, Teeth Intact   Pulmonary former smoker   breath sounds clear to auscultation       Cardiovascular hypertension, Pt. on medications and Pt. on home beta blockers + dysrhythmias Atrial Fibrillation  Rhythm:Irregular Rate:Normal  '17 ECHO: EF 55% to 60%.  - Wall motion was normal; there were no regional wall motion    abnormalities.  - Mitral valve: Calcified annulus. There was mild regurgitation.  - Left atrium: The atrium was severely dilated.  - Right ventricle: The cavity size was mildly dilated.  - Right atrium: The atrium was severely dilated.     Neuro/Psych CVA (2017), No Residual Symptoms    GI/Hepatic Neg liver ROS,GERD  Medicated and Controlled,,  Endo/Other  negative endocrine ROS    Renal/GU Renal InsufficiencyRenal disease     Musculoskeletal   Abdominal   Peds  Hematology Xarelto: last dose thursday   Anesthesia Other Findings H/o prostate cancer  Reproductive/Obstetrics                              Anesthesia Physical Anesthesia Plan  ASA: 3  Anesthesia Plan: Spinal   Post-op Pain Management: Tylenol PO (pre-op)*   Induction:   PONV Risk Score and Plan: 1  Airway Management Planned: Natural Airway and Simple Face Mask  Additional Equipment: None  Intra-op Plan:   Post-operative Plan:   Informed Consent: I have reviewed the patients History and Physical, chart, labs and discussed the procedure including the risks, benefits and alternatives for the proposed anesthesia with the patient or authorized  representative who has indicated his/her understanding and acceptance.     Dental advisory given  Plan Discussed with: CRNA and Surgeon  Anesthesia Plan Comments: (See PAT note 05/07/2023)        Anesthesia Quick Evaluation

## 2023-05-18 ENCOUNTER — Encounter (HOSPITAL_COMMUNITY)
Admission: RE | Disposition: A | Discharge status: Home / Self Care | Payer: Self-pay | Source: Ambulatory Visit | Attending: Orthopedic Surgery

## 2023-05-18 ENCOUNTER — Ambulatory Visit (HOSPITAL_COMMUNITY): Payer: Medicare HMO

## 2023-05-18 ENCOUNTER — Other Ambulatory Visit: Payer: Self-pay

## 2023-05-18 ENCOUNTER — Ambulatory Visit (HOSPITAL_BASED_OUTPATIENT_CLINIC_OR_DEPARTMENT_OTHER): Payer: Medicare HMO | Admitting: Certified Registered"

## 2023-05-18 ENCOUNTER — Observation Stay (HOSPITAL_COMMUNITY)
Admission: RE | Admit: 2023-05-18 | Discharge: 2023-05-19 | Disposition: A | Discharge status: Home / Self Care | Payer: Medicare HMO | Source: Ambulatory Visit | Attending: Orthopedic Surgery | Admitting: Orthopedic Surgery

## 2023-05-18 ENCOUNTER — Ambulatory Visit (HOSPITAL_COMMUNITY): Payer: Medicare HMO | Admitting: Physician Assistant

## 2023-05-18 ENCOUNTER — Encounter (HOSPITAL_COMMUNITY): Payer: Self-pay | Admitting: Orthopedic Surgery

## 2023-05-18 DIAGNOSIS — M1611 Unilateral primary osteoarthritis, right hip: Principal | ICD-10-CM | POA: Insufficient documentation

## 2023-05-18 DIAGNOSIS — I10 Essential (primary) hypertension: Secondary | ICD-10-CM | POA: Diagnosis not present

## 2023-05-18 DIAGNOSIS — I4821 Permanent atrial fibrillation: Secondary | ICD-10-CM | POA: Insufficient documentation

## 2023-05-18 DIAGNOSIS — Z79899 Other long term (current) drug therapy: Secondary | ICD-10-CM | POA: Insufficient documentation

## 2023-05-18 DIAGNOSIS — Z87891 Personal history of nicotine dependence: Secondary | ICD-10-CM | POA: Insufficient documentation

## 2023-05-18 DIAGNOSIS — Z8546 Personal history of malignant neoplasm of prostate: Secondary | ICD-10-CM | POA: Insufficient documentation

## 2023-05-18 DIAGNOSIS — Z8673 Personal history of transient ischemic attack (TIA), and cerebral infarction without residual deficits: Secondary | ICD-10-CM | POA: Diagnosis not present

## 2023-05-18 DIAGNOSIS — I129 Hypertensive chronic kidney disease with stage 1 through stage 4 chronic kidney disease, or unspecified chronic kidney disease: Secondary | ICD-10-CM | POA: Diagnosis not present

## 2023-05-18 DIAGNOSIS — N183 Chronic kidney disease, stage 3 unspecified: Secondary | ICD-10-CM | POA: Diagnosis not present

## 2023-05-18 DIAGNOSIS — M1612 Unilateral primary osteoarthritis, left hip: Principal | ICD-10-CM | POA: Diagnosis present

## 2023-05-18 DIAGNOSIS — Z7901 Long term (current) use of anticoagulants: Secondary | ICD-10-CM | POA: Insufficient documentation

## 2023-05-18 DIAGNOSIS — Z01818 Encounter for other preprocedural examination: Secondary | ICD-10-CM

## 2023-05-18 HISTORY — PX: TOTAL HIP ARTHROPLASTY: SHX124

## 2023-05-18 SURGERY — ARTHROPLASTY, HIP, TOTAL, ANTERIOR APPROACH
Anesthesia: Spinal | Site: Hip | Laterality: Right

## 2023-05-18 MED ORDER — MORPHINE SULFATE (PF) 2 MG/ML IV SOLN: 0.5000 mg | INTRAVENOUS | Status: DC | PRN

## 2023-05-18 MED ORDER — BUPIVACAINE IN DEXTROSE 0.75-8.25 % IT SOLN
INTRATHECAL | Status: DC | PRN
Start: 2023-05-18 — End: 2023-05-18
  Administered 2023-05-18: 13.5 mg via INTRATHECAL

## 2023-05-18 MED ORDER — OXYCODONE HCL 5 MG/5ML PO SOLN: 5.0000 mg | Freq: Once | ORAL | Status: DC | PRN

## 2023-05-18 MED ORDER — EPINEPHRINE PF 1 MG/ML IJ SOLN
INTRAMUSCULAR | Status: AC
  Filled 2023-05-18: qty 1

## 2023-05-18 MED ORDER — POLYETHYLENE GLYCOL 3350 17 G PO PACK: 17.0000 g | PACK | Freq: Every day | ORAL | Status: DC | PRN

## 2023-05-18 MED ORDER — ACETAMINOPHEN 10 MG/ML IV SOLN
1000.0000 mg | Freq: Four times a day (QID) | INTRAVENOUS | Status: DC
  Administered 2023-05-18: 1000 mg via INTRAVENOUS
  Filled 2023-05-18: qty 100

## 2023-05-18 MED ORDER — METHOCARBAMOL 1000 MG/10ML IJ SOLN: 500.0000 mg | Freq: Four times a day (QID) | INTRAVENOUS | Status: DC | PRN

## 2023-05-18 MED ORDER — LACTATED RINGERS IV SOLN: INTRAVENOUS | Status: DC

## 2023-05-18 MED ORDER — DEXAMETHASONE SODIUM PHOSPHATE 10 MG/ML IJ SOLN
8.0000 mg | Freq: Once | INTRAMUSCULAR | Status: AC
  Administered 2023-05-18: 8 mg via INTRAVENOUS

## 2023-05-18 MED ORDER — CEFAZOLIN SODIUM-DEXTROSE 2-4 GM/100ML-% IV SOLN
2.0000 g | INTRAVENOUS | Status: AC
  Administered 2023-05-18: 2 g via INTRAVENOUS
  Filled 2023-05-18: qty 100

## 2023-05-18 MED ORDER — ONDANSETRON HCL 4 MG/2ML IJ SOLN: 4.0000 mg | Freq: Four times a day (QID) | INTRAMUSCULAR | Status: DC | PRN

## 2023-05-18 MED ORDER — ORAL CARE MOUTH RINSE: 15.0000 mL | Freq: Once | OROMUCOSAL | Status: AC

## 2023-05-18 MED ORDER — SODIUM CHLORIDE 0.9 % IV SOLN: INTRAVENOUS | Status: DC

## 2023-05-18 MED ORDER — RIVAROXABAN 10 MG PO TABS
10.0000 mg | ORAL_TABLET | Freq: Every day | ORAL | Status: DC
  Administered 2023-05-19: 10 mg via ORAL
  Filled 2023-05-18: qty 1

## 2023-05-18 MED ORDER — GLYCOPYRROLATE 0.2 MG/ML IJ SOLN
INTRAMUSCULAR | Status: AC
  Filled 2023-05-18: qty 1

## 2023-05-18 MED ORDER — ONDANSETRON HCL 4 MG/2ML IJ SOLN
INTRAMUSCULAR | Status: DC | PRN
  Administered 2023-05-18: 4 mg via INTRAVENOUS

## 2023-05-18 MED ORDER — METHOCARBAMOL 500 MG PO TABS
500.0000 mg | ORAL_TABLET | Freq: Four times a day (QID) | ORAL | Status: DC | PRN
  Administered 2023-05-18 – 2023-05-19 (×3): 500 mg via ORAL
  Filled 2023-05-18 (×3): qty 1

## 2023-05-18 MED ORDER — PROPOFOL 500 MG/50ML IV EMUL
INTRAVENOUS | Status: DC | PRN
Start: 2023-05-18 — End: 2023-05-18
  Administered 2023-05-18: 50 ug/kg/min via INTRAVENOUS
  Administered 2023-05-18: 30 mg via INTRAVENOUS

## 2023-05-18 MED ORDER — BUPIVACAINE HCL 0.25 % IJ SOLN
INTRAMUSCULAR | Status: AC
  Filled 2023-05-18: qty 1

## 2023-05-18 MED ORDER — METOCLOPRAMIDE HCL 5 MG/ML IJ SOLN: 5.0000 mg | Freq: Three times a day (TID) | INTRAMUSCULAR | Status: DC | PRN

## 2023-05-18 MED ORDER — ACETAMINOPHEN 325 MG PO TABS
325.0000 mg | ORAL_TABLET | Freq: Four times a day (QID) | ORAL | Status: DC | PRN
  Administered 2023-05-19: 650 mg via ORAL
  Filled 2023-05-18 (×2): qty 2

## 2023-05-18 MED ORDER — PHENYLEPHRINE HCL-NACL 20-0.9 MG/250ML-% IV SOLN
INTRAVENOUS | Status: DC | PRN
Start: 2023-05-18 — End: 2023-05-18
  Administered 2023-05-18: 20 ug/min via INTRAVENOUS

## 2023-05-18 MED ORDER — DEXAMETHASONE SODIUM PHOSPHATE 10 MG/ML IJ SOLN
10.0000 mg | Freq: Once | INTRAMUSCULAR | Status: AC
  Administered 2023-05-19: 10 mg via INTRAVENOUS
  Filled 2023-05-18: qty 1

## 2023-05-18 MED ORDER — PHENOL 1.4 % MT LIQD: 1.0000 | OROMUCOSAL | Status: DC | PRN

## 2023-05-18 MED ORDER — 0.9 % SODIUM CHLORIDE (POUR BTL) OPTIME
TOPICAL | Status: DC | PRN
  Administered 2023-05-18: 1000 mL

## 2023-05-18 MED ORDER — MIDAZOLAM HCL 2 MG/2ML IJ SOLN: 0.5000 mg | Freq: Once | INTRAMUSCULAR | Status: DC | PRN

## 2023-05-18 MED ORDER — TRAMADOL HCL 50 MG PO TABS
50.0000 mg | ORAL_TABLET | Freq: Three times a day (TID) | ORAL | Status: DC | PRN
  Administered 2023-05-18: 50 mg via ORAL
  Administered 2023-05-19 (×2): 100 mg via ORAL
  Filled 2023-05-18: qty 1
  Filled 2023-05-18 (×2): qty 2

## 2023-05-18 MED ORDER — WATER FOR IRRIGATION, STERILE IR SOLN
Status: DC | PRN
  Administered 2023-05-18: 2000 mL

## 2023-05-18 MED ORDER — TRANEXAMIC ACID-NACL 1000-0.7 MG/100ML-% IV SOLN
1000.0000 mg | INTRAVENOUS | Status: AC
  Administered 2023-05-18: 1000 mg via INTRAVENOUS
  Filled 2023-05-18: qty 100

## 2023-05-18 MED ORDER — ONDANSETRON HCL 4 MG PO TABS: 4.0000 mg | ORAL_TABLET | Freq: Four times a day (QID) | ORAL | Status: DC | PRN

## 2023-05-18 MED ORDER — MAGNESIUM CITRATE PO SOLN: 1.0000 | Freq: Once | ORAL | Status: DC | PRN

## 2023-05-18 MED ORDER — METOPROLOL SUCCINATE ER 25 MG PO TB24
25.0000 mg | ORAL_TABLET | Freq: Every day | ORAL | Status: DC
  Administered 2023-05-19: 25 mg via ORAL
  Filled 2023-05-18: qty 1

## 2023-05-18 MED ORDER — HYDROCODONE-ACETAMINOPHEN 7.5-325 MG PO TABS
1.0000 | ORAL_TABLET | ORAL | Status: DC | PRN
  Filled 2023-05-18: qty 1

## 2023-05-18 MED ORDER — BISACODYL 10 MG RE SUPP: 10.0000 mg | Freq: Every day | RECTAL | Status: DC | PRN

## 2023-05-18 MED ORDER — POVIDONE-IODINE 10 % EX SWAB: 2.0000 | Freq: Once | CUTANEOUS | Status: DC

## 2023-05-18 MED ORDER — ACETAMINOPHEN 500 MG PO TABS: 1000.0000 mg | ORAL_TABLET | Freq: Once | ORAL | Status: DC

## 2023-05-18 MED ORDER — CEFAZOLIN SODIUM-DEXTROSE 2-4 GM/100ML-% IV SOLN
2.0000 g | Freq: Four times a day (QID) | INTRAVENOUS | Status: AC
  Administered 2023-05-18 – 2023-05-19 (×2): 2 g via INTRAVENOUS
  Filled 2023-05-18 (×2): qty 100

## 2023-05-18 MED ORDER — ROSUVASTATIN CALCIUM 10 MG PO TABS
10.0000 mg | ORAL_TABLET | Freq: Every day | ORAL | Status: DC
  Filled 2023-05-18: qty 1

## 2023-05-18 MED ORDER — BUPIVACAINE-EPINEPHRINE (PF) 0.25% -1:200000 IJ SOLN
INTRAMUSCULAR | Status: DC | PRN
  Administered 2023-05-18: 30 mL via PERINEURAL

## 2023-05-18 MED ORDER — CHLORHEXIDINE GLUCONATE 0.12 % MT SOLN
15.0000 mL | Freq: Once | OROMUCOSAL | Status: AC
  Administered 2023-05-18: 15 mL via OROMUCOSAL

## 2023-05-18 MED ORDER — DOCUSATE SODIUM 100 MG PO CAPS
100.0000 mg | ORAL_CAPSULE | Freq: Two times a day (BID) | ORAL | Status: DC
  Filled 2023-05-18 (×2): qty 1

## 2023-05-18 MED ORDER — FENTANYL CITRATE (PF) 100 MCG/2ML IJ SOLN
INTRAMUSCULAR | Status: AC
  Filled 2023-05-18: qty 2

## 2023-05-18 MED ORDER — HYDROCODONE-ACETAMINOPHEN 5-325 MG PO TABS: 1.0000 | ORAL_TABLET | ORAL | Status: DC | PRN

## 2023-05-18 MED ORDER — OXYCODONE HCL 5 MG PO TABS: 5.0000 mg | ORAL_TABLET | Freq: Once | ORAL | Status: DC | PRN

## 2023-05-18 MED ORDER — PROPOFOL 10 MG/ML IV BOLUS
INTRAVENOUS | Status: AC
  Filled 2023-05-18: qty 20

## 2023-05-18 MED ORDER — HYDROMORPHONE HCL 1 MG/ML IJ SOLN: 0.2500 mg | INTRAMUSCULAR | Status: DC | PRN

## 2023-05-18 MED ORDER — METOCLOPRAMIDE HCL 5 MG PO TABS: 5.0000 mg | ORAL_TABLET | Freq: Three times a day (TID) | ORAL | Status: DC | PRN

## 2023-05-18 MED ORDER — MENTHOL 3 MG MT LOZG
1.0000 | LOZENGE | OROMUCOSAL | Status: DC | PRN
  Administered 2023-05-18: 3 mg via ORAL
  Filled 2023-05-18: qty 9

## 2023-05-18 MED ORDER — FAMOTIDINE 20 MG PO TABS
20.0000 mg | ORAL_TABLET | Freq: Two times a day (BID) | ORAL | Status: DC
  Administered 2023-05-18 – 2023-05-19 (×2): 20 mg via ORAL
  Filled 2023-05-18 (×2): qty 1

## 2023-05-18 MED ORDER — FENTANYL CITRATE (PF) 100 MCG/2ML IJ SOLN
INTRAMUSCULAR | Status: DC | PRN
Start: 2023-05-18 — End: 2023-05-18
  Administered 2023-05-18 (×2): 25 ug via INTRAVENOUS
  Administered 2023-05-18 (×2): 12.5 ug via INTRAVENOUS

## 2023-05-18 MED ORDER — GLYCOPYRROLATE 0.2 MG/ML IJ SOLN
INTRAMUSCULAR | Status: DC | PRN
Start: 2023-05-18 — End: 2023-05-18
  Administered 2023-05-18: .1 mg via INTRAVENOUS

## 2023-05-18 SURGICAL SUPPLY — 42 items
ADH SKN CLS APL DERMABOND .7 (GAUZE/BANDAGES/DRESSINGS) ×1
BAG COUNTER SPONGE SURGICOUNT (BAG) IMPLANT
BAG SPEC THK2 15X12 ZIP CLS (MISCELLANEOUS)
BAG SPNG CNTER NS LX DISP (BAG)
BAG ZIPLOCK 12X15 (MISCELLANEOUS) IMPLANT
BALL HIP ARTICU EZE 36 8.5 (Hips) IMPLANT
BLADE SAG 18X100X1.27 (BLADE) ×2 IMPLANT
COVER PERINEAL POST (MISCELLANEOUS) ×2 IMPLANT
COVER SURGICAL LIGHT HANDLE (MISCELLANEOUS) ×2 IMPLANT
CUP ACET PINNACLE SECTR 56MM (Hips) IMPLANT
DERMABOND ADVANCED .7 DNX12 (GAUZE/BANDAGES/DRESSINGS) ×2 IMPLANT
DRAPE FOOT SWITCH (DRAPES) ×2 IMPLANT
DRAPE STERI IOBAN 125X83 (DRAPES) ×2 IMPLANT
DRAPE U-SHAPE 47X51 STRL (DRAPES) ×4 IMPLANT
DRSG AQUACEL AG ADV 3.5X10 (GAUZE/BANDAGES/DRESSINGS) ×2 IMPLANT
DURAPREP 26ML APPLICATOR (WOUND CARE) ×2 IMPLANT
ELECT REM PT RETURN 15FT ADLT (MISCELLANEOUS) ×2 IMPLANT
GLOVE BIO SURGEON STRL SZ 6.5 (GLOVE) IMPLANT
GLOVE BIO SURGEON STRL SZ8 (GLOVE) ×2 IMPLANT
GLOVE BIOGEL PI IND STRL 6.5 (GLOVE) IMPLANT
GLOVE BIOGEL PI IND STRL 7.0 (GLOVE) IMPLANT
GLOVE BIOGEL PI IND STRL 8 (GLOVE) ×2 IMPLANT
GOWN STRL REUS W/ TWL LRG LVL3 (GOWN DISPOSABLE) ×2 IMPLANT
GOWN STRL REUS W/TWL LRG LVL3 (GOWN DISPOSABLE) ×1
HIP BALL ARTICU EZE 36 8.5 (Hips) ×1 IMPLANT
HOLDER FOLEY CATH W/STRAP (MISCELLANEOUS) ×2 IMPLANT
KIT TURNOVER KIT A (KITS) IMPLANT
LINER MARATHON 4 NEUTRAL 36X56 (Hips) IMPLANT
MANIFOLD NEPTUNE II (INSTRUMENTS) ×2 IMPLANT
PACK ANTERIOR HIP CUSTOM (KITS) ×2 IMPLANT
PENCIL SMOKE EVACUATOR COATED (MISCELLANEOUS) ×2 IMPLANT
PINNACLE SECTOR CUP 56MM (Hips) ×1 IMPLANT
SPIKE FLUID TRANSFER (MISCELLANEOUS) ×2 IMPLANT
STEM FEMORAL SZ5 HIGH ACTIS (Stem) IMPLANT
SUT ETHIBOND NAB CT1 #1 30IN (SUTURE) ×2 IMPLANT
SUT MNCRL AB 4-0 PS2 18 (SUTURE) ×2 IMPLANT
SUT STRATAFIX 0 PDS 27 VIOLET (SUTURE) ×1
SUT VIC AB 2-0 CT1 27 (SUTURE) ×2
SUT VIC AB 2-0 CT1 TAPERPNT 27 (SUTURE) ×4 IMPLANT
SUTURE STRATFX 0 PDS 27 VIOLET (SUTURE) ×2 IMPLANT
TRAY FOLEY MTR SLVR 16FR STAT (SET/KITS/TRAYS/PACK) ×2 IMPLANT
TUBE SUCTION HIGH CAP CLEAR NV (SUCTIONS) ×2 IMPLANT

## 2023-05-18 NOTE — Anesthesia Procedure Notes (Addendum)
Procedure Name: MAC Date/Time: 05/18/2023 2:04 PM  Performed by: Floydene Flock, CRNAPre-anesthesia Checklist: Patient identified, Emergency Drugs available, Suction available, Patient being monitored and Timeout performed Patient Re-evaluated:Patient Re-evaluated prior to induction Oxygen Delivery Method: Simple face mask Placement Confirmation: positive ETCO2

## 2023-05-18 NOTE — Plan of Care (Signed)
Problem: Education: Goal: Knowledge of the prescribed therapeutic regimen will improve Outcome: Progressing Goal: Understanding of discharge needs will improve Outcome: Progressing Goal: Individualized Educational Video(s) Outcome: Progressing

## 2023-05-18 NOTE — Anesthesia Procedure Notes (Signed)
Spinal  Patient location during procedure: OR End time: 05/18/2023 1:53 PM Reason for block: surgical anesthesia Staffing Performed: anesthesiologist  Anesthesiologist: Jairo Ben, MD Performed by: Jairo Ben, MD Authorized by: Jairo Ben, MD   Preanesthetic Checklist Completed: patient identified, IV checked, site marked, risks and benefits discussed, surgical consent, monitors and equipment checked, pre-op evaluation and timeout performed Spinal Block Patient position: sitting Prep: DuraPrep and site prepped and draped Patient monitoring: blood pressure, continuous pulse ox, cardiac monitor and heart rate Approach: midline Location: L3-4 Injection technique: single-shot Needle Needle type: Pencan and Introducer  Needle gauge: 24 G Needle length: 9 cm Assessment Events: CSF return Additional Notes Pt identified in Operating room.  Monitors applied. Working IV access confirmed. Sterile prep, drape lumbar spine.  1% lido local L 3,4.  #24ga Pencan into clear CSF L 3,4.  13.5mg  0.75% Bupivacaine with dextrose injected with asp CSF beginning and end of injection.  Patient asymptomatic, VSS, no heme aspirated, tolerated well.  Sandford Craze, MD

## 2023-05-18 NOTE — Discharge Instructions (Signed)
Ollen Gross, MD Total Joint Specialist [facility] [address] [place], [place] [address] [phone]  ANTERIOR APPROACH TOTAL HIP REPLACEMENT POSTOPERATIVE DIRECTIONS     Hip Rehabilitation, Guidelines Following Surgery  The results of a hip operation are greatly improved after range of motion and muscle strengthening exercises. Follow all safety measures which are given to protect your hip. If any of these exercises cause increased pain or swelling in your joint, decrease the amount until you are comfortable again. Then slowly increase the exercises. Call your caregiver if you have problems or questions.   BLOOD CLOT PREVENTION Resume your Xarelto upon discharge from the hospital. You may resume your vitamins/supplements. Do not take any NSAIDs (Advil, Aleve, Ibuprofen, Meloxicam, etc.) while taking Xarelto.    HOME CARE INSTRUCTIONS  Remove items at home which could result in a fall. This includes throw rugs or furniture in walking pathways.  ICE to the affected hip as frequently as 20-30 minutes an hour and then as needed for pain and swelling. Continue to use ice on the hip for pain and swelling from surgery. You may notice swelling that will progress down to the foot and ankle. This is normal after surgery. Elevate the leg when you are not up walking on it.   Continue to use the breathing machine which will help keep your temperature down.  It is common for your temperature to cycle up and down following surgery, especially at night when you are not up moving around and exerting yourself.  The breathing machine keeps your lungs expanded and your temperature down.  DIET You may resume your previous home diet once your are discharged from the hospital.  DRESSING / WOUND CARE / SHOWERING You have an adhesive waterproof bandage over the incision. Leave this in place until your first follow-up appointment. Once you remove this you will not need to place  another bandage.  You may begin showering 3 days following surgery, but do not submerge the incision under water.  ACTIVITY For the first 3-5 days, it is important to rest and keep the operative leg elevated. You should, as a general rule, rest for 50 minutes and walk/stretch for 10 minutes per hour. After 5 days, you may slowly increase activity as tolerated.  Perform the exercises you were provided twice a day for about 15-20 minutes each session. Begin these 2 days following surgery. Walk with your walker as instructed. Use the walker until you are comfortable transitioning to a cane. Walk with the cane in the opposite hand of the operative leg. You may discontinue the cane once you are comfortable and walking steadily. Avoid periods of inactivity such as sitting longer than an hour when not asleep. This helps prevent blood clots.  Do not drive a car for 6 weeks or until released by your surgeon.  Do not drive while taking narcotics.  TED HOSE STOCKINGS Wear the elastic stockings on both legs for three weeks following surgery during the day. You may remove them at night while sleeping.  WEIGHT BEARING Weight bearing as tolerated with assist device (walker, cane, etc) as directed, use it as long as suggested by your surgeon or therapist, typically at least 4-6 weeks.  POSTOPERATIVE CONSTIPATION PROTOCOL Constipation - defined medically as fewer than three stools per week and severe constipation as less than one stool per week.  One of the most common issues patients have following surgery is constipation.  Even if you have a regular bowel pattern at  home, your normal regimen is likely to be disrupted due to multiple reasons following surgery.  Combination of anesthesia, postoperative narcotics, change in appetite and fluid intake all can affect your bowels.  In order to avoid complications following surgery, here are some recommendations in order to help you during your recovery  period.  Colace (docusate) - Pick up an over-the-counter form of Colace or another stool softener and take twice a day as long as you are requiring postoperative pain medications.  Take with a full glass of water daily.  If you experience loose stools or diarrhea, hold the colace until you stool forms back up.  If your symptoms do not get better within 1 week or if they get worse, check with your doctor. Dulcolax (bisacodyl) - Pick up over-the-counter and take as directed by the product packaging as needed to assist with the movement of your bowels.  Take with a full glass of water.  Use this product as needed if not relieved by Colace only.  MiraLax (polyethylene glycol) - Pick up over-the-counter to have on hand.  MiraLax is a solution that will increase the amount of water in your bowels to assist with bowel movements.  Take as directed and can mix with a glass of water, juice, soda, coffee, or tea.  Take if you go more than two days without a movement.Do not use MiraLax more than once per day. Call your doctor if you are still constipated or irregular after using this medication for 7 days in a row.  If you continue to have problems with postoperative constipation, please contact the office for further assistance and recommendations.  If you experience "the worst abdominal pain ever" or develop nausea or vomiting, please contact the office immediatly for further recommendations for treatment.  ITCHING  If you experience itching with your medications, try taking only a single pain pill, or even half a pain pill at a time.  You can also use Benadryl over the counter for itching or also to help with sleep.   MEDICATIONS See your medication summary on the "After Visit Summary" that the nursing staff will review with you prior to discharge.  You may have some home medications which will be placed on hold until you complete the course of blood thinner medication.  It is important for you to complete the  blood thinner medication as prescribed by your surgeon.  Continue your approved medications as instructed at time of discharge.  PRECAUTIONS If you experience chest pain or shortness of breath - call 911 immediately for transfer to the hospital emergency department.  If you develop a fever greater that 101 F, purulent drainage from wound, increased redness or drainage from wound, foul odor from the wound/dressing, or calf pain - CONTACT YOUR SURGEON.                                                   FOLLOW-UP APPOINTMENTS Make sure you keep all of your appointments after your operation with your surgeon and caregivers. You should call the office at the above phone number and make an appointment for approximately two weeks after the date of your surgery or on the date instructed by your surgeon outlined in the "After Visit Summary".  RANGE OF MOTION AND STRENGTHENING EXERCISES  These exercises are designed to help you keep full movement  of your hip joint. Follow your caregiver's or physical therapist's instructions. Perform all exercises about fifteen times, three times per day or as directed. Exercise both hips, even if you have had only one joint replacement. These exercises can be done on a training (exercise) mat, on the floor, on a table or on a bed. Use whatever works the best and is most comfortable for you. Use music or television while you are exercising so that the exercises are a pleasant break in your day. This will make your life better with the exercises acting as a break in routine you can look forward to.  Lying on your back, slowly slide your foot toward your buttocks, raising your knee up off the floor. Then slowly slide your foot back down until your leg is straight again.  Lying on your back spread your legs as far apart as you can without causing discomfort.  Lying on your side, raise your upper leg and foot straight up from the floor as far as is comfortable. Slowly lower the leg  and repeat.  Lying on your back, tighten up the muscle in the front of your thigh (quadriceps muscles). You can do this by keeping your leg straight and trying to raise your heel off the floor. This helps strengthen the largest muscle supporting your knee.  Lying on your back, tighten up the muscles of your buttocks both with the legs straight and with the knee bent at a comfortable angle while keeping your heel on the floor.   POST-OPERATIVE OPIOID TAPER INSTRUCTIONS: It is important to wean off of your opioid medication as soon as possible. If you do not need pain medication after your surgery it is ok to stop day one. Opioids include: Codeine, Hydrocodone(Norco, Vicodin), Oxycodone(Percocet, oxycontin) and hydromorphone amongst others.  Long term and even short term use of opiods can cause: Increased pain response Dependence Constipation Depression Respiratory depression And more.  Withdrawal symptoms can include Flu like symptoms Nausea, vomiting And more Techniques to manage these symptoms Hydrate well Eat regular healthy meals Stay active Use relaxation techniques(deep breathing, meditating, yoga) Do Not substitute Alcohol to help with tapering If you have been on opioids for less than two weeks and do not have pain than it is ok to stop all together.  Plan to wean off of opioids This plan should start within one week post op of your joint replacement. Maintain the same interval or time between taking each dose and first decrease the dose.  Cut the total daily intake of opioids by one tablet each day Next start to increase the time between doses. The last dose that should be eliminated is the evening dose.   IF YOU ARE TRANSFERRED TO A SKILLED REHAB FACILITY If the patient is transferred to a skilled rehab facility following release from the hospital, a list of the current medications will be sent to the facility for the patient to continue.  When discharged from the skilled  rehab facility, please have the facility set up the patient's Home Health Physical Therapy prior to being released. Also, the skilled facility will be responsible for providing the patient with their medications at time of release from the facility to include their pain medication, the muscle relaxants, and their blood thinner medication. If the patient is still at the rehab facility at time of the two week follow up appointment, the skilled rehab facility will also need to assist the patient in arranging follow up appointment in our office and  any transportation needs.  MAKE SURE YOU:  Understand these instructions.  Get help right away if you are not doing well or get worse.    DENTAL ANTIBIOTICS:  In most cases prophylactic antibiotics for Dental procdeures after total joint surgery are not necessary.  Exceptions are as follows:  1. History of prior total joint infection  2. Severely immunocompromised (Organ Transplant, cancer chemotherapy, Rheumatoid biologic meds such as Humera)  3. Poorly controlled diabetes (A1C &gt; 8.0, blood glucose over 200)  If you have one of these conditions, contact your surgeon for an antibiotic prescription, prior to your dental procedure.    Pick up stool softner and laxative for home use following surgery while on pain medications. Do not submerge incision under water. Please use good hand washing techniques while changing dressing each day. May shower starting three days after surgery. Please use a clean towel to pat the incision dry following showers. Continue to use ice for pain and swelling after surgery. Do not use any lotions or creams on the incision until instructed by your surgeon.

## 2023-05-18 NOTE — Op Note (Signed)
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   POSTOPERATIVE DIAGNOSIS: Osteoarthritis of the Right  hip.   PROCEDURE: Right total hip arthroplasty, anterior approach.   SURGEON: Ollen Gross, MD   ASSISTANT: Weston Brass, PA-C  ANESTHESIA:  Spinal  ESTIMATED BLOOD LOSS:-300 mL    DRAINS: None  COMPLICATIONS: None   CONDITION: PACU - hemodynamically stable.   BRIEF CLINICAL NOTE: Elijah [name] is a 82 y.o. male who has advanced end-  stage arthritis of their Right  hip with progressively worsening pain and  dysfunction.The patient has failed nonoperative management and presents for  total hip arthroplasty.   PROCEDURE IN DETAIL: After successful administration of spinal  anesthetic, the traction boots for the [facility] bed were placed on both  feet and the patient was placed onto the [facility] bed, boots placed into the leg  holders. The Right hip was then isolated from the perineum with plastic  drapes and prepped and draped in the usual sterile fashion. ASIS and  greater trochanter were marked and a oblique incision was made, starting  at about 1 cm lateral and 2 cm distal to the ASIS and coursing towards  the anterior cortex of the femur. The skin was cut with a 10 blade  through subcutaneous tissue to the level of the fascia overlying the  tensor fascia lata muscle. The fascia was then incised in line with the  incision at the junction of the anterior third and posterior 2/3rd. The  muscle was teased off the fascia and then the interval between the TFL  and the rectus was developed. The Hohmann retractor was then placed at  the top of the femoral neck over the capsule. The vessels overlying the  capsule were cauterized and the fat on top of the capsule was removed.  A Hohmann retractor was then placed anterior underneath the rectus  femoris to give exposure to the entire anterior capsule. A T-shaped  capsulotomy  was performed. The edges were tagged and the femoral head  was identified.       Osteophytes are removed off the superior acetabulum.  The femoral neck was then cut in situ with an oscillating saw. Traction  was then applied to the left lower extremity utilizing the [facility]  traction. The femoral head was then removed. Retractors were placed  around the acetabulum and then circumferential removal of the labrum was  performed. Osteophytes were also removed. Reaming starts at 51 mm to  medialize and  Increased in 2 mm increments to 55 mm. We reamed in  approximately 40 degrees of abduction, 20 degrees anteversion. A 56 mm  pinnacle acetabular shell was then impacted in anatomic position under  fluoroscopic guidance with excellent purchase. We did not need to place  any additional dome screws. A 36 mm neutral + 4 marathon liner was then  placed into the acetabular shell.       The femoral lift was then placed along the lateral aspect of the femur  just distal to the vastus ridge. The leg was  externally rotated and capsule  was stripped off the inferior aspect of the femoral neck down to the  level of the lesser trochanter, this was done with electrocautery. The femur was lifted after this was performed. The  leg was then placed in an extended and adducted position essentially delivering the femur. We also removed the capsule superiorly and the piriformis from the piriformis fossa to gain  excellent exposure of the  proximal femur. Rongeur was used to remove some cancellous bone to get  into the lateral portion of the proximal femur for placement of the  initial starter reamer. The starter broaches was placed  the starter broach  and was shown to go down the center of the canal. Broaching  with the Actis system was then performed starting at size 0  coursing  Up to size 5. A size 5 had excellent torsional and rotational  and axial stability. The trial high offset neck was then placed  with a 36 +  8.5 trial head. The hip was then reduced. We confirmed that  the stem was in the canal both on AP and lateral x-rays. It also has excellent sizing. The hip was reduced with outstanding stability through full extension and full external rotation.. AP pelvis was taken and the leg lengths were measured and found to be equal. Hip was then dislocated again and the femoral head and neck removed. The  femoral broach was removed. Size 5 Actis stem with a high offset  neck was then impacted into the femur following native anteversion. Has  excellent purchase in the canal. Excellent torsional and rotational and  axial stability. It is confirmed to be in the canal on AP and lateral  fluoroscopic views. The 36 + 8.5 metal head was placed and the hip  reduced with outstanding stability. Again AP pelvis was taken and it  confirmed that the leg lengths were equal. The wound was then copiously  irrigated with saline solution and the capsule reattached and repaired  with Ethibond suture. 30 ml of .25% Bupivicaine was  injected into the capsule and into the edge of the tensor fascia lata as well as subcutaneous tissue. The fascia overlying the tensor fascia lata was then closed with a running #1 V-Loc. Subcu was closed with interrupted 2-0 Vicryl and subcuticular running 4-0 Monocryl. Incision was cleaned  and dried. Steri-Strips and a bulky sterile dressing applied. The patient was awakened and transported to  recovery in stable condition.        Please note that a surgical assistant was a medical necessity for this procedure to perform it in a safe and expeditious manner. Assistant was necessary to provide appropriate retraction of vital neurovascular structures and to prevent femoral fracture and allow for anatomic placement of the prosthesis.  Ollen Gross, M.D.

## 2023-05-18 NOTE — Transfer of Care (Signed)
Immediate Anesthesia Transfer of Care Note  Patient: Elijah [name]  Procedure(s) Performed: TOTAL HIP ARTHROPLASTY ANTERIOR APPROACH (Right: Hip)  Patient Location: PACU and SICU  Anesthesia Type:MAC and Spinal  Level of Consciousness: awake, alert , and oriented  Airway & Oxygen Therapy: Patient Spontanous Breathing  Post-op Assessment: Report given to RN and Post -op Vital signs reviewed and stable  Post vital signs: Reviewed and stable  Last Vitals:  Vitals Value Taken Time  BP 102/53 05/18/23 1530  Temp    Pulse 54 05/18/23 1531  Resp 17 05/18/23 1531  SpO2 94 % 05/18/23 1531  Vitals shown include unfiled device data.  Last Pain:  Vitals:   05/18/23 1133  TempSrc: Oral  PainSc: 0-No pain      Patients Stated Pain Goal: 4 (05/18/23 1133)  Complications: No notable events documented.

## 2023-05-18 NOTE — Anesthesia Postprocedure Evaluation (Signed)
Anesthesia Post Note  Patient: Domonic Kimball  Procedure(s) Performed: TOTAL HIP ARTHROPLASTY ANTERIOR APPROACH (Right: Hip)     Patient location during evaluation: PACU Anesthesia Type: Spinal Level of consciousness: awake and alert, patient cooperative and oriented Pain management: pain level controlled Vital Signs Assessment: post-procedure vital signs reviewed and stable Respiratory status: spontaneous breathing, nonlabored ventilation and respiratory function stable Cardiovascular status: blood pressure returned to baseline and stable Postop Assessment: no apparent nausea or vomiting, patient able to bend at knees and spinal receding Anesthetic complications: no   No notable events documented.  Last Vitals:  Vitals:   05/18/23 1630 05/18/23 1645  BP: (!) 168/82 (!) 157/78  Pulse: (!) 54 (!) 41  Resp: 13 19  Temp:    SpO2: 93% 99%    Last Pain:  Vitals:   05/18/23 1630  TempSrc:   PainSc: 0-No pain                 Sherrian Nunnelley,E. Whitaker Holderman

## 2023-05-18 NOTE — Interval H&P Note (Signed)
History and Physical Interval Note:  05/18/2023 11:34 AM  Elijah [name]  has presented today for surgery, with the diagnosis of right hip osteoarthritis.  The various methods of treatment have been discussed with the patient and family. After consideration of risks, benefits and other options for treatment, the patient has consented to  Procedure(s): TOTAL HIP ARTHROPLASTY ANTERIOR APPROACH (Right) as a surgical intervention.  The patient's history has been reviewed, patient examined, no change in status, stable for surgery.  I have reviewed the patient's chart and labs.  Questions were answered to the patient's satisfaction.     [name]

## 2023-05-19 ENCOUNTER — Encounter (HOSPITAL_COMMUNITY): Payer: Self-pay | Admitting: Orthopedic Surgery

## 2023-05-19 DIAGNOSIS — M1611 Unilateral primary osteoarthritis, right hip: Secondary | ICD-10-CM | POA: Diagnosis not present

## 2023-05-19 LAB — BASIC METABOLIC PANEL
Anion gap: 7 (ref 5–15)
BUN: 26 mg/dL — ABNORMAL HIGH (ref 8–23)
CO2: 22 mmol/L (ref 22–32)
Calcium: 8.8 mg/dL — ABNORMAL LOW (ref 8.9–10.3)
Chloride: 108 mmol/L (ref 98–111)
Creatinine, Ser: 1.32 mg/dL — ABNORMAL HIGH (ref 0.61–1.24)
GFR, Estimated: 54 mL/min — ABNORMAL LOW (ref >60)
Glucose, Bld: 187 mg/dL — ABNORMAL HIGH (ref 70–99)
Potassium: 4.5 mmol/L (ref 3.5–5.1)
Sodium: 137 mmol/L (ref 135–145)

## 2023-05-19 LAB — CBC
HCT: 41.7 % (ref 39.0–52.0)
Hemoglobin: 14.1 g/dL (ref 13.0–17.0)
MCH: 31.6 pg (ref 26.0–34.0)
MCHC: 33.8 g/dL (ref 30.0–36.0)
MCV: 93.5 fL (ref 80.0–100.0)
Platelets: 155 10*3/uL (ref 150–400)
RBC: 4.46 MIL/uL (ref 4.22–5.81)
RDW: 13.4 % (ref 11.5–15.5)
WBC: 13 10*3/uL — ABNORMAL HIGH (ref 4.0–10.5)
nRBC: 0 % (ref 0.0–0.2)

## 2023-05-19 MED ORDER — ONDANSETRON HCL 4 MG PO TABS: 4.0000 mg | ORAL_TABLET | Freq: Four times a day (QID) | ORAL | 0 refills | Status: DC | PRN

## 2023-05-19 MED ORDER — TRAMADOL HCL 50 MG PO TABS: 50.0000 mg | ORAL_TABLET | Freq: Three times a day (TID) | ORAL | 0 refills | Status: DC | PRN

## 2023-05-19 MED ORDER — METHOCARBAMOL 500 MG PO TABS: 500.0000 mg | ORAL_TABLET | Freq: Four times a day (QID) | ORAL | 0 refills | Status: DC | PRN

## 2023-05-19 MED ORDER — HYDROCODONE-ACETAMINOPHEN 5-325 MG PO TABS: 1.0000 | ORAL_TABLET | Freq: Four times a day (QID) | ORAL | 0 refills | Status: DC | PRN

## 2023-05-19 NOTE — Evaluation (Signed)
Physical Therapy Evaluation Patient Details Name: Elijah [name] MRN: [id number] DOB: 06-26-1941 Today's Date: 05/19/2023  History of Present Illness  Pt is 82 yo male s/p R anterior THA on 05/18/23.  Pt with hx including but not limited to spinal stenosis, CVA, GERD, OA, HLD, afib, Bil neuropathy in feet, HTN  Clinical Impression  Pt is s/p THA resulting in the deficits listed below (see PT Problem List). At baseline, pt is active and independent without AD.  Today, pt up for first time with therapy.  He required CGA to stand and to ambulate 110' with RW.  Pt with some shakiness and mild instability - did improve some with shoes donned (hx of neuropathy).  Attempted stairs in multiple manners (pt has 2 with no rails) - he was not comfortable backward with RW, some improvement with cane and HHA but needs to practice with wife assist. Will f/u in afternoon for further therapy and stair progression - pt to call wife to come. Pt will benefit from acute skilled PT to increase their independence and safety with mobility to facilitate discharge.          If plan is discharge home, recommend the following: A little help with walking and/or transfers;A little help with bathing/dressing/bathroom;Assistance with cooking/housework;Help with stairs or ramp for entrance   Can travel by private vehicle        Equipment Recommendations Rolling walker (2 wheels)  Recommendations for Other Services       Functional Status Assessment Patient has had a recent decline in their functional status and demonstrates the ability to make significant improvements in function in a reasonable and predictable amount of time.     Precautions / Restrictions Precautions Precautions: Fall Precaution Comments: No hip precautions Restrictions Weight Bearing Restrictions: Yes RLE Weight Bearing: Weight bearing as tolerated LLE Weight Bearing: Weight bearing as tolerated      Mobility  Bed Mobility Overal bed mobility:  Needs Assistance Bed Mobility: Supine to Sit     Supine to sit: Supervision, HOB elevated     General bed mobility comments: Cues on assisting R LE with gait belt    Transfers Overall transfer level: Needs assistance Equipment used: Rolling walker (2 wheels) Transfers: Sit to/from Stand Sit to Stand: Contact guard assist           General transfer comment: STS x 3 during session with cues for hand placement and increased time    Ambulation/Gait Ambulation/Gait assistance: Contact guard assist Gait Distance (Feet): 80 Feet (80' then 30') Assistive device: Rolling walker (2 wheels) Gait Pattern/deviations: Decreased stride length, Step-through pattern, Decreased weight shift to right Gait velocity: decreased     General Gait Details: Noted pt "hitting" bil feet hard on ground - hx peripheral neuropathy with no sensation.  On second bout of gait , donned shoes and with improvement in ground strike force.  Pt was cued for sequencing and RW proximity.  He did has some shakiness and episodes of unsteadiness where at least 1 walker leg would lift.  Pt did not have LOB but did require constant min guard.  Stairs Stairs: Yes Stairs assistance: Min assist Stair Management: Step to pattern   General stair comments: Started forward 2 steps with bil rails and CGA - tolerated well but does not have rails.  Tried 2 steps backward with RW - pt fearful, requiring max cues, and min A to stabilize.  Tried 2 steps with cane and HHA - required min A, pt more confident but  still some shakiness.  WIll need to practice with wife this afternoon.  Wheelchair Mobility     Tilt Bed    Modified Rankin (Stroke Patients Only)       Balance Overall balance assessment: Needs assistance Sitting-balance support: No upper extremity supported Sitting balance-Leahy Scale: Good     Standing balance support: Bilateral upper extremity supported, Reliant on assistive device for balance Standing  balance-Leahy Scale: Poor Standing balance comment: Needs RW and CGA with gait                             Pertinent Vitals/Pain Pain Assessment Pain Assessment: 0-10 Pain Score: 5  Pain Location: R hip Pain Descriptors / Indicators: Sore Pain Intervention(s): Limited activity within patient's tolerance, Monitored during session, Premedicated before session, Repositioned, Ice applied    Home Living Family/patient expects to be discharged to:: Private residence Living Arrangements: Spouse/significant other Available Help at Discharge: Family;Available 24 hours/day Type of Home: House Home Access: Stairs to enter Entrance Stairs-Rails: None Entrance Stairs-Number of Steps: 2   Home Layout: Two level;Able to live on main level with bedroom/bathroom Home Equipment: None Additional Comments: Has RW delivered in room    Prior Function Prior Level of Function : Independent/Modified Independent             Mobility Comments: REports could ambulate in community without AD; Was going to the [facility] multiple x per week ADLs Comments: independent ADLs and IADLs     Extremity/Trunk Assessment   Upper Extremity Assessment Upper Extremity Assessment: Overall WFL for tasks assessed    Lower Extremity Assessment Lower Extremity Assessment: LLE deficits/detail;RLE deficits/detail RLE Deficits / Details: ROM WFL; MMT: ankle 5/5, knee ext 3/5 not further tested, hip flexion and abd 2/5 RLE Sensation: decreased light touch;history of peripheral neuropathy LLE Deficits / Details: ROM WFL; MMT 5/5; does report OA in L hip also LLE Sensation: decreased light touch;history of peripheral neuropathy    Cervical / Trunk Assessment Cervical / Trunk Assessment: Normal  Communication      Cognition Arousal: Alert Behavior During Therapy: WFL for tasks assessed/performed Overall Cognitive Status: Within Functional Limits for tasks assessed                                           General Comments      Exercises Total Joint Exercises Ankle Circles/Pumps: AROM, Both, 10 reps, Supine Heel Slides: AAROM, Right, 5 reps, Supine Hip ABduction/ADduction: AAROM, Right, 5 reps, Supine   Assessment/Plan    PT Assessment Patient needs continued PT services  PT Problem List Decreased strength;Pain;Decreased range of motion;Decreased activity tolerance;Decreased balance;Decreased mobility;Decreased knowledge of use of DME;Decreased safety awareness       PT Treatment Interventions DME instruction;Therapeutic exercise;Gait training;Stair training;Functional mobility training;Therapeutic activities;Patient/family education;Modalities;Neuromuscular re-education;Balance training    PT Goals (Current goals can be found in the Care Plan section)  Acute Rehab PT Goals Patient Stated Goal: return home PT Goal Formulation: With patient Time For Goal Achievement: 06/02/23 Potential to Achieve Goals: Good    Frequency 7X/week     Co-evaluation               AM-PAC PT "6 Clicks" Mobility  Outcome Measure Help needed turning from your back to your side while in a flat bed without using bedrails?: A Little Help needed moving from  lying on your back to sitting on the side of a flat bed without using bedrails?: A Little Help needed moving to and from a bed to a chair (including a wheelchair)?: A Little Help needed standing up from a chair using your arms (e.g., wheelchair or bedside chair)?: A Little Help needed to walk in hospital room?: A Little Help needed climbing 3-5 steps with a railing? : A Lot 6 Click Score: 17    End of Session Equipment Utilized During Treatment: Gait belt Activity Tolerance: Patient tolerated treatment well Patient left: with chair alarm set;in chair;with call bell/phone within reach Nurse Communication: Mobility status PT Visit Diagnosis: Other abnormalities of gait and mobility (R26.89);Muscle weakness (generalized)  (M62.81)    Time: 1610-9604 PT Time Calculation (min) (ACUTE ONLY): 38 min   Charges:   PT Evaluation $PT Eval Low Complexity: 1 Low PT Treatments $Gait Training: 23-37 mins PT General Charges $$ ACUTE PT VISIT: 1 Visit         Anise Salvo, PT Acute Rehab [facility] [phone]    Rayetta Humphrey 05/19/2023, 12:47 PM

## 2023-05-19 NOTE — Progress Notes (Signed)
   Subjective: 1 Day Post-Op Procedure(s) (LRB): TOTAL HIP ARTHROPLASTY ANTERIOR APPROACH (Right) Patient reports pain as mild.   Patient seen in rounds by Dr. Lequita Halt. Patient is well, and has had no acute complaints or problems. Denies chest pain or SOB. No issues overnight. Foley catheter removed this AM. We will begin therapy today.   Objective: Vital signs in last 24 hours: Temp:  [97.8 F (36.6 C)-98.3 F (36.8 C)] 98 F (36.7 C) (10/15 0524) Pulse Rate:  [41-78] 76 (10/15 0524) Resp:  [13-19] 15 (10/15 0524) BP: (102-170)/(53-100) 156/97 (10/15 0524) SpO2:  [93 %-100 %] 97 % (10/15 0524) Weight:  [95 kg] 95 kg (10/14 1825)  Intake/Output from previous day:  Intake/Output Summary (Last 24 hours) at 05/19/2023 0819 Last data filed at 05/19/2023 0600 Gross per 24 hour  Intake 1460 ml  Output 1250 ml  Net 210 ml     Intake/Output this shift: No intake/output data recorded.  Labs: Recent Labs    05/19/23 0338  HGB 14.1   Recent Labs    05/19/23 0338  WBC 13.0*  RBC 4.46  HCT 41.7  PLT 155   Recent Labs    05/19/23 0338  NA 137  K 4.5  CL 108  CO2 22  BUN 26*  CREATININE 1.32*  GLUCOSE 187*  CALCIUM 8.8*   No results for input(s): "LABPT", "INR" in the last 72 hours.  Exam: General - Patient is Alert and Oriented Extremity - Neurologically intact Neurovascular intact Sensation intact distally Dorsiflexion/Plantar flexion intact Dressing - dressing C/D/I Motor Function - intact, moving foot and toes well on exam.   Past Medical History:  Diagnosis Date   Adenomatous colon polyp    Arthritis 09/10/2011   rt. hip, knee, hands   Cancer (HCC) 09/10/2011   dx. Prostate cancer-bx. 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 etiology   Peripheral neuropathy    Permanent atrial fibrillation (HCC) 09/10/2011   Pre-diabetes    Prostate cancer (HCC)    Stroke (HCC)     Assessment/Plan: 1 Day Post-Op Procedure(s) (LRB): TOTAL HIP ARTHROPLASTY ANTERIOR APPROACH (Right) Principal Problem:   Osteoarthritis of left hip Active Problems:   Osteoarthritis of right hip  Estimated body mass index is 26.89 kg/m as calculated from the following:   Height as of this encounter: 6\' 2"  (1.88 m).   Weight as of this encounter: 95 kg. Advance diet Up with therapy D/C IV fluids  DVT Prophylaxis - Xarelto Weight bearing as tolerated. Begin therapy.  Foley catheter removed this AM, will have to be voiding without issue prior to discharge. History of prostate CA.  Plan is to go Home after hospital stay. Plan for discharge with HEP later today if progresses with therapy and meeting goals. Follow-up in the office in 2 weeks.  The PDMP database was reviewed today prior to any opioid medications being prescribed to this patient.  Arther Abbott, PA-C Orthopedic Surgery [phone] 05/19/2023, 8:19 AM

## 2023-05-19 NOTE — Plan of Care (Signed)
  Problem: Education: Goal: Knowledge of the prescribed therapeutic regimen will improve Outcome: Progressing   Problem: Activity: Goal: Ability to avoid complications of mobility impairment will improve Outcome: Progressing   Problem: Clinical Measurements: Goal: Postoperative complications will be avoided or minimized Outcome: Progressing   Problem: Pain Management: Goal: Pain level will decrease with appropriate interventions Outcome: Progressing   Problem: Skin Integrity: Goal: Will show signs of wound healing Outcome: Progressing   Problem: Skin Integrity: Goal: Will show signs of wound healing Outcome: Progressing

## 2023-05-19 NOTE — Progress Notes (Signed)
Physical Therapy Treatment Patient Details Name: Elijah [name] MRN: [id number] DOB: July 25, 1941 Today's Date: 05/19/2023   History of Present Illness Pt is 82 yo male s/p R anterior THA on 05/18/23.  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  He was able to ambulate 100' with improved stability. He was also able to perform stair training with wife assisting.  Pt with good understanding of HEP and progression of HEP.  Pt demonstrates safe gait & transfers in order to return home from PT perspective once discharged by MD.  While in hospital, will continue to benefit from PT for skilled therapy to advance mobility and exercises.       If plan is discharge home, recommend the following: A little help with walking and/or transfers;A little help with bathing/dressing/bathroom;Assistance with cooking/housework;Help with stairs or ramp for entrance   Can travel by private vehicle        Equipment Recommendations  Rolling walker (2 wheels)    Recommendations for Other Services       Precautions / Restrictions Precautions Precautions: Fall Precaution Comments: No hip precautions Restrictions RLE Weight Bearing: Weight bearing as tolerated LLE Weight Bearing: Weight bearing as tolerated     Mobility  Bed Mobility Overal bed mobility: Needs Assistance Bed Mobility: Supine to Sit     Supine to sit: Supervision, HOB elevated     General bed mobility comments: in chair    Transfers Overall transfer level: Needs assistance Equipment used: Rolling walker (2 wheels) Transfers: Sit to/from Stand Sit to Stand: Supervision           General transfer comment: Good recall of hand placement    Ambulation/Gait Ambulation/Gait assistance: Supervision Gait Distance (Feet): 100 Feet Assistive device: Rolling walker (2 wheels) Gait Pattern/deviations: Decreased  stride length, Step-through pattern, Decreased weight shift to right Gait velocity: decreased     General Gait Details: Walked with shoes on.  Good RW proximity and no unsteadiness/lifting of RW legs.   Stairs Stairs: Yes Stairs assistance: Min assist Stair Management: Step to pattern   General stair comments: Started forward 2 steps with cane on L and HHA R - did well up but again shakey/nervous down.  Pt started to attempt with wife assist but uncomfortable with HHA.  Switched to pts hand over wife's shoulders with her hand around his back and cane in L - pt able to do this up and down comfortably and safely   Wheelchair Mobility     Tilt Bed    Modified Rankin (Stroke Patients Only)       Balance Overall balance assessment: Needs assistance Sitting-balance support: No upper extremity supported Sitting balance-Leahy Scale: Good     Standing balance support: Bilateral upper extremity supported, Reliant on assistive device for balance Standing balance-Leahy Scale: Poor Standing balance comment: Needs RW or UE support - steady with UE supprot                            Cognition Arousal: Alert Behavior During Therapy: WFL for tasks assessed/performed Overall Cognitive Status: Within Functional Limits for tasks assessed  Exercises Total Joint Exercises Ankle Circles/Pumps: AROM, Both, 10 reps, Supine Quad Sets: AROM, Both, 10 reps, Supine Heel Slides: AAROM, Right, 5 reps, Supine Hip ABduction/ADduction: AAROM, Right, 5 reps, Supine Long Arc Quad: AROM, Right, 5 reps, Seated Other Exercises Other Exercises: Educated on progression to standing exercises-demonstrated hip flex, hip abd, knee flex, hip ext on R    General Comments   Educated on safe ice use, no pivots, car transfers. Also, encouraged walking every 1-2 hours during day. Educated on HEP with focus on mobility the first weeks. Discussed  doing exercises within pain control and if pain increasing could decreased ROM, reps, and stop exercises as needed. Encouraged to perform  ankle pumps frequently for blood flow.      Pertinent Vitals/Pain Pain Assessment Pain Assessment: 0-10 Pain Score: 5  Pain Location: R hip Pain Descriptors / Indicators: Sore Pain Intervention(s): Limited activity within patient's tolerance, Monitored during session, Premedicated before session, Repositioned    Home Living Family/patient expects to be discharged to:: Private residence Living Arrangements: Spouse/significant other Available Help at Discharge: Family;Available 24 hours/day Type of Home: House Home Access: Stairs to enter Entrance Stairs-Rails: None Entrance Stairs-Number of Steps: 2   Home Layout: Two level;Able to live on main level with bedroom/bathroom Home Equipment: None Additional Comments: Has RW delivered in room    Prior Function            PT Goals (current goals can now be found in the care plan section) Acute Rehab PT Goals Patient Stated Goal: return home PT Goal Formulation: With patient Time For Goal Achievement: 06/02/23 Potential to Achieve Goals: Good Progress towards PT goals: Progressing toward goals    Frequency    7X/week      PT Plan      Co-evaluation              AM-PAC PT "6 Clicks" Mobility   Outcome Measure  Help needed turning from your back to your side while in a flat bed without using bedrails?: A Little Help needed moving from lying on your back to sitting on the side of a flat bed without using bedrails?: A Little Help needed moving to and from a bed to a chair (including a wheelchair)?: A Little Help needed standing up from a chair using your arms (e.g., wheelchair or bedside chair)?: A Little Help needed to walk in hospital room?: A Little Help needed climbing 3-5 steps with a railing? : A Little 6 Click Score: 18    End of Session Equipment Utilized During  Treatment: Gait belt Activity Tolerance: Patient tolerated treatment well Patient left: in chair;with call bell/phone within reach;with family/visitor present Nurse Communication: Mobility status PT Visit Diagnosis: Other abnormalities of gait and mobility (R26.89);Muscle weakness (generalized) (M62.81)     Time: 4540-9811 PT Time Calculation (min) (ACUTE ONLY): 33 min  Charges:    $Gait Training: 8-22 mins $Therapeutic Exercise: 8-22 mins PT General Charges $$ ACUTE PT VISIT: 1 Visit                     Anise Salvo, PT Acute Rehab Services [facility] [phone]    Rayetta Humphrey 05/19/2023, 2:17 PM

## 2023-05-19 NOTE — TOC Transition Note (Signed)
Transition of Care [facility]) - CM/SW Discharge Note  Patient Details  Name: Elijah [name] MRN: [id number] Date of Birth: [birth year]  Transition of Care [facility]) CM/SW Contact:  Ewing Schlein, LCSW Phone Number: 05/19/2023, 12:00 PM  Clinical Narrative: Patient is expected to discharge home after working with PT. CSW met with patient to confirm discharge plan and needs. Patient will go home with a home exercise program (HEP). Patient will need a rolling walker, which [employer] delivered to patient's room. TOC signing off.  Final next level of care: Home/Self Care Barriers to Discharge: No Barriers Identified  Patient Goals and CMS Choice CMS Medicare.gov Compare Post Acute Care list provided to:: Patient Choice offered to / list presented to : Patient  Discharge Plan and Services Additional resources added to the After Visit Summary for           DME Arranged: Walker rolling DME Agency: [employer] Representative spoke with at DME Agency: Prearranged in orthopedist's office  Social Determinants of Health (SDOH) Interventions SDOH Screenings   Food Insecurity: No Food Insecurity (05/18/2023)  Housing: Low Risk  (05/18/2023)  Transportation Needs: No Transportation Needs (05/18/2023)  Utilities: Not At Risk (05/18/2023)  Depression (PHQ2-9): Low Risk  (04/16/2023)  Financial Resource Strain: Low Risk  (11/14/2022)  Physical Activity: Sufficiently Active (11/14/2022)  Social Connections: Moderately Integrated (11/14/2022)  Stress: No Stress Concern Present (11/14/2022)  Tobacco Use: Medium Risk (05/18/2023)   Readmission Risk Interventions     No data to display

## 2023-05-19 NOTE — Progress Notes (Signed)
Provided discharge education/instructions, all questions and concerns addressed. Pt is not in any distress, discharged home with all of his belongings accompanied by wife.

## 2023-05-20 NOTE — Discharge Summary (Signed)
Patient ID: Elijah [name] MRN: [id number] DOB/AGE: Nov 29, 1940 82 y.o.  Admit date: 05/18/2023 Discharge date: 05/19/2023  Admission Diagnoses:  Principal Problem:   Osteoarthritis of left hip Active Problems:   Osteoarthritis of right hip   Discharge Diagnoses:  Same  Past Medical History:  Diagnosis Date   Adenomatous colon polyp    Arthritis 09/10/2011   rt. hip, knee, hands   Cancer (HCC) 09/10/2011   dx. Prostate cancer-bx. 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   Peripheral neuropathy    Permanent atrial fibrillation (HCC) 09/10/2011   Pre-diabetes    Prostate cancer (HCC)    Stroke [facility])     Surgeries: Procedure(s): TOTAL HIP ARTHROPLASTY ANTERIOR APPROACH on 05/18/2023   Consultants:   Discharged Condition: Improved  Hospital Course: Elijah [name] is an 82 y.o. male who was admitted 05/18/2023 for operative treatment ofOsteoarthritis of left hip. Patient has severe unremitting pain that affects sleep, daily activities, and work/hobbies. After pre-op clearance the patient was taken to the operating room on 05/18/2023 and underwent  Procedure(s): TOTAL HIP ARTHROPLASTY ANTERIOR APPROACH.    Patient was given perioperative antibiotics:  Anti-infectives (From admission, onward)    Start     Dose/Rate Route Frequency Ordered Stop   05/18/23 2000  ceFAZolin (ANCEF) IVPB 2g/100 mL premix        2 g 200 mL/hr over 30 Minutes Intravenous Every 6 hours 05/18/23 1723 05/19/23 0746   05/18/23 1130  ceFAZolin (ANCEF) IVPB 2g/100 mL premix        2 g 200 mL/hr over 30 Minutes Intravenous On call to O.R. 05/18/23 1115 05/18/23 1422        Patient was given sequential compression devices,  early ambulation, and chemoprophylaxis to prevent DVT.  Patient benefited maximally from hospital stay and there were no complications.    Recent vital signs: No data found.   Recent laboratory studies:  Recent Labs    05/19/23 0338  WBC 13.0*  HGB 14.1  HCT 41.7  PLT 155  NA 137  K 4.5  CL 108  CO2 22  BUN 26*  CREATININE 1.32*  GLUCOSE 187*  CALCIUM 8.8*     Discharge Medications:   Allergies as of 05/19/2023       Reactions   Atorvastatin    Mental fogginess. Perceived leg weakness    Ciprofloxacin Nausea Only   Flagyl [metronidazole] Nausea Only        Medication List     TAKE these medications    acetaminophen 650 MG CR tablet Commonly known as: TYLENOL Take 650 mg by mouth 2 (two) times daily.   famotidine 20 MG tablet Commonly known as: PEPCID TAKE 1 TABLET(20 MG) BY MOUTH TWICE DAILY   HYDROcodone-acetaminophen 5-325 MG tablet Commonly known as: NORCO/VICODIN Take 1-2 tablets by mouth every 6 (six) hours as needed for severe pain (pain score 7-10).   methocarbamol 500 MG tablet Commonly known as: ROBAXIN Take 1 tablet (500 mg total) by mouth every 6 (six) hours as needed for muscle spasms. Notes to patient: Last dose given 10/15 06:43am   metoprolol succinate 25 MG 24 hr tablet Commonly known as: TOPROL-XL  TAKE 1 TABLET BY MOUTH DAILY WITH OR IMMEDIATELY FOLLOWING A MEAL   multivitamin with minerals Tabs tablet Take 1 tablet by mouth daily. Notes to patient: Resume home regimen   ondansetron 4 MG tablet Commonly known as: ZOFRAN Take 1 tablet (4 mg total) by mouth every 6 (six) hours as needed for nausea. Notes to patient: Last dose given 02:13pm   rosuvastatin 10 MG tablet Commonly known as: CRESTOR Take 1 tablet (10 mg total) by mouth daily. Notes to patient: Resume home regimen   traMADol 50 MG tablet Commonly known as: ULTRAM Take 1-2 tablets (50-100 mg total) by mouth every 8 (eight) hours as needed for moderate pain (pain  score 4-6). Notes to patient: Last dose given 10/15 10:31am   Xarelto 20 MG Tabs tablet Generic drug: rivaroxaban TAKE 1 TABLET(20 MG) BY MOUTH DAILY               Discharge Care Instructions  (From admission, onward)           Start     Ordered   05/19/23 0000  Weight bearing as tolerated        05/19/23 0822   05/19/23 0000  Change dressing       Comments: You have an adhesive waterproof bandage over the incision. Leave this in place until your first follow-up appointment. Once you remove this you will not need to place another bandage.   05/19/23 1610            Diagnostic Studies: DG HIP UNILAT WITH PELVIS 1V RIGHT  Result Date: 05/18/2023 CLINICAL DATA:  Elective surgery. EXAM: DG HIP (WITH OR WITHOUT PELVIS) 1V RIGHT COMPARISON:  None Available. FINDINGS: Two fluoroscopic spot views of the pelvis and right hip obtained in the operating room. Images during hip arthroplasty. Fluoroscopy time 9 seconds. Dose 1.329 mGy. IMPRESSION: Intraoperative fluoroscopy for right hip arthroplasty. Electronically Signed   By: Narda Rutherford M.D.   On: 05/18/2023 17:43   DG C-Arm 1-60 Min-No Report  Result Date: 05/18/2023 Fluoroscopy was utilized by the requesting physician.  No radiographic interpretation.   DG C-Arm 1-60 Min-No Report  Result Date: 05/18/2023 Fluoroscopy was utilized by the requesting physician.  No radiographic interpretation.    Disposition: Discharge disposition: 01-Home or Self Care       Discharge Instructions     Call MD / Call 911   Complete by: As directed    If you experience chest pain or shortness of breath, CALL 911 and be transported to the hospital emergency room.  If you develope a fever above 101 F, pus (white drainage) or increased drainage or redness at the wound, or calf pain, call your surgeon's office.   Change dressing   Complete by: As directed    You have an adhesive waterproof bandage over the incision. Leave this in  place until your first follow-up appointment. Once you remove this you will not need to place another bandage.   Constipation Prevention   Complete by: As directed    Drink plenty of fluids.  Prune juice may be helpful.  You may use a stool softener, such as Colace (over the counter) 100 mg twice a day.  Use MiraLax (over the counter) for constipation as needed.   Diet - low sodium heart healthy   Complete by: As directed    Do not sit on low chairs, stoools or toilet seats, as it may be difficult to get up from low surfaces   Complete  by: As directed    Driving restrictions   Complete by: As directed    No driving for two weeks   Post-operative opioid taper instructions:   Complete by: As directed    POST-OPERATIVE OPIOID TAPER INSTRUCTIONS: It is important to wean off of your opioid medication as soon as possible. If you do not need pain medication after your surgery it is ok to stop day one. Opioids include: Codeine, Hydrocodone(Norco, Vicodin), Oxycodone(Percocet, oxycontin) and hydromorphone amongst others.  Long term and even short term use of opiods can cause: Increased pain response Dependence Constipation Depression Respiratory depression And more.  Withdrawal symptoms can include Flu like symptoms Nausea, vomiting And more Techniques to manage these symptoms Hydrate well Eat regular healthy meals Stay active Use relaxation techniques(deep breathing, meditating, yoga) Do Not substitute Alcohol to help with tapering If you have been on opioids for less than two weeks and do not have pain than it is ok to stop all together.  Plan to wean off of opioids This plan should start within one week post op of your joint replacement. Maintain the same interval or time between taking each dose and first decrease the dose.  Cut the total daily intake of opioids by one tablet each day Next start to increase the time between doses. The last dose that should be eliminated is the  evening dose.      TED hose   Complete by: As directed    Use stockings (TED hose) for three weeks on both leg(s).  You may remove them at night for sleeping.   Weight bearing as tolerated   Complete by: As directed         Follow-up Information     Aluisio, Homero Fellers, MD Follow up in 2 week(s).   Specialty: Orthopedic Surgery Contact information: [address] [place] [address] [place] [address] [phone]                  Signed: Arther Abbott 05/20/2023, 1:39 PM

## 2023-06-10 ENCOUNTER — Telehealth: Payer: Self-pay | Admitting: Family Medicine

## 2023-06-10 ENCOUNTER — Ambulatory Visit: Payer: Medicare HMO | Admitting: Family Medicine

## 2023-06-10 NOTE — Telephone Encounter (Signed)
Patient would like to schedule an appt w/Dr Durene Cal..  He has some questions regarding his statins that he is taking.Marland Kitchen He states his legs are very weak, no strength in them.  Can someone call him back to schedule an appt?  Thank you Gabriel [facility] AWV TEAM Direct Dial [phone]

## 2023-06-10 NOTE — Telephone Encounter (Signed)
Please see message below. Is there anyway patient can be worked in or should he see another provider? Please Advise.

## 2023-06-10 NOTE — Telephone Encounter (Signed)
He can see an available provider.

## 2023-06-10 NOTE — Telephone Encounter (Signed)
Patient has been scheduled with Jarold Motto on 06/15/23 @ 9:40 am.

## 2023-06-12 NOTE — Progress Notes (Signed)
Elijah [name] is a 82 y.o. male {ELNP/FU:31256} History of Present Illness:  No chief complaint on file.  HPI Leg Cramping: {ELStarters:31163} *** *** *** *** *** ***  *** *** *** *** *** Past Medical History:  Diagnosis Date   Adenomatous colon polyp    Arthritis 09/10/2011   rt. hip, knee, hands   Cancer (HCC) 09/10/2011   dx. Prostate cancer-bx. 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   Peripheral neuropathy    Permanent atrial fibrillation (HCC) 09/10/2011   Pre-diabetes    Prostate cancer (HCC)    Stroke (HCC)     Social History   Tobacco Use   Smoking status: Former    Current packs/day: 0.00    Types: Cigarettes    Quit date: 08/04/1968    Years since quitting: 54.8   Smokeless tobacco: Never  Vaping Use   Vaping status: Never Used  Substance Use Topics   Alcohol use: No    Comment: none in 20 yrs   Drug use: No   Past Surgical History:  Procedure Laterality Date   COLONOSCOPY  04/2003, 06/05/2011   diverticulosis, internal and external hemorrhoids 2004 and 2012, 2 small polyps 2012   EYE SURGERY  09/10/2011   Only stitches to close cut, and observation of hematoma from injury, cataracts bilateral   INGUINAL HERNIA REPAIR Right 06/16/2019   Procedure: OPEN RIGHT INGUINAL HERNIA REPAIR;  Surgeon: Ovidio Kin, MD;  Location: [facility] OR;  Service: General;  Laterality: Right;  OPEN RIGHT INGUINAL HERNIA REPAIR   LUMBAR LAMINECTOMY/DECOMPRESSION MICRODISCECTOMY N/A 09/23/2021   Procedure: Laminectomy and Foraminotomy - Lumbar Two- Lumbar Three - Lumbar Three- Lumbar Four - Lumbar Four- Lumbar Five;  Surgeon: Donalee Citrin, MD;  Location: [facility] OR;  Service: Neurosurgery;  Laterality:  N/A;  Laminectomy and Foraminotomy - Lumbar Two- Lumbar Three - Lumbar Three- Lumbar Four - Lumbar Four- Lumbar Five   melanoma surgery      ROBOT ASSISTED LAPAROSCOPIC RADICAL PROSTATECTOMY  09/15/2011   Procedure: ROBOTIC ASSISTED LAPAROSCOPIC RADICAL PROSTATECTOMY LEVEL 2;  Surgeon: Crecencio Mc, MD;  Location: [facility] ORS;  Service: Urology;  Laterality: N/A;         TONSILLECTOMY     TOTAL HIP ARTHROPLASTY Right 05/18/2023   Procedure: TOTAL HIP ARTHROPLASTY ANTERIOR APPROACH;  Surgeon: Ollen Gross, MD;  Location: [facility] ORS;  Service: Orthopedics;  Laterality: Right;   Family History  Problem Relation Age of Onset   Diabetes Mother    Hypertension Mother    Diverticulosis Mother    Heart attack Father 55       MI. died at 12   Prostate cancer Brother    Alzheimer's disease Paternal Grandmother    Heart attack Paternal Grandfather    Colon cancer Neg Hx    Allergies  Allergen Reactions   Atorvastatin     Mental fogginess. Perceived leg weakness    Ciprofloxacin Nausea Only   Flagyl [Metronidazole] Nausea Only   Current Medications:   Current Outpatient Medications:    acetaminophen (TYLENOL) 650 MG CR tablet, Take 650 mg by mouth 2 (two) times daily., Disp: , Rfl:    famotidine (PEPCID) 20 MG  tablet, TAKE 1 TABLET(20 MG) BY MOUTH TWICE DAILY, Disp: 180 tablet, Rfl: 3   HYDROcodone-acetaminophen (NORCO/VICODIN) 5-325 MG tablet, Take 1-2 tablets by mouth every 6 (six) hours as needed for severe pain (pain score 7-10)., Disp: 42 tablet, Rfl: 0   methocarbamol (ROBAXIN) 500 MG tablet, Take 1 tablet (500 mg total) by mouth every 6 (six) hours as needed for muscle spasms., Disp: 40 tablet, Rfl: 0   metoprolol succinate (TOPROL-XL) 25 MG 24 hr tablet, TAKE 1 TABLET BY MOUTH DAILY WITH OR IMMEDIATELY FOLLOWING A MEAL, Disp: 90 tablet, Rfl: 3   Multiple Vitamin (MULTIVITAMIN WITH MINERALS) TABS tablet, Take 1 tablet by mouth daily., Disp: , Rfl:    ondansetron (ZOFRAN) 4 MG tablet, Take  1 tablet (4 mg total) by mouth every 6 (six) hours as needed for nausea., Disp: 20 tablet, Rfl: 0   rosuvastatin (CRESTOR) 10 MG tablet, Take 1 tablet (10 mg total) by mouth daily., Disp: 30 tablet, Rfl: 5   traMADol (ULTRAM) 50 MG tablet, Take 1-2 tablets (50-100 mg total) by mouth every 8 (eight) hours as needed for moderate pain (pain score 4-6)., Disp: 40 tablet, Rfl: 0   XARELTO 20 MG TABS tablet, TAKE 1 TABLET(20 MG) BY MOUTH DAILY, Disp: 90 tablet, Rfl: 1  Review of Systems:   ROS See pertinent positives and negatives as per the HPI.  Vitals:   There were no vitals filed for this visit.   There is no height or weight on file to calculate BMI.  Physical Exam:   Physical Exam  Assessment and Plan:   There are no diagnoses linked to this encounter.            I,Emily Lagle,acting as a Neurosurgeon for [employer], PA.,have documented all relevant documentation on the behalf of Jarold Motto, PA,as directed by  Jarold Motto, PA while in the presence of Jarold Motto, [place].  *** (refresh reminder)  I, Jarold Motto, PA, have reviewed all documentation for this visit. The documentation on 06/12/23 for the exam, diagnosis, procedures, and orders are all accurate and complete.  Jarold Motto, PA-C

## 2023-06-15 ENCOUNTER — Encounter: Payer: Self-pay | Admitting: Physician Assistant

## 2023-06-15 ENCOUNTER — Ambulatory Visit (INDEPENDENT_AMBULATORY_CARE_PROVIDER_SITE_OTHER): Payer: Medicare HMO | Admitting: Physician Assistant

## 2023-06-15 VITALS — BP 118/76 | HR 57 | Temp 98.0°F | Ht 74.0 in | Wt 207.0 lb

## 2023-06-15 DIAGNOSIS — R052 Subacute cough: Secondary | ICD-10-CM | POA: Diagnosis not present

## 2023-06-15 DIAGNOSIS — M79605 Pain in left leg: Secondary | ICD-10-CM | POA: Diagnosis not present

## 2023-06-15 DIAGNOSIS — E785 Hyperlipidemia, unspecified: Secondary | ICD-10-CM

## 2023-06-15 DIAGNOSIS — M79604 Pain in right leg: Secondary | ICD-10-CM | POA: Diagnosis not present

## 2023-06-15 DIAGNOSIS — R29898 Other symptoms and signs involving the musculoskeletal system: Secondary | ICD-10-CM

## 2023-06-15 LAB — COMPREHENSIVE METABOLIC PANEL
ALT: 19 U/L (ref 0–53)
AST: 17 U/L (ref 0–37)
Albumin: 3.5 g/dL (ref 3.5–5.2)
Alkaline Phosphatase: 115 U/L (ref 39–117)
BUN: 23 mg/dL (ref 6–23)
CO2: 27 meq/L (ref 19–32)
Calcium: 9.6 mg/dL (ref 8.4–10.5)
Chloride: 106 meq/L (ref 96–112)
Creatinine, Ser: 1.16 mg/dL (ref 0.40–1.50)
GFR: 58.89 mL/min — ABNORMAL LOW (ref >60.00)
Glucose, Bld: 81 mg/dL (ref 70–99)
Potassium: 4.8 meq/L (ref 3.5–5.1)
Sodium: 140 meq/L (ref 135–145)
Total Bilirubin: 0.8 mg/dL (ref 0.2–1.2)
Total Protein: 6.7 g/dL (ref 6.0–8.3)

## 2023-06-15 LAB — LIPID PANEL
Cholesterol: 138 mg/dL (ref 0–200)
HDL: 39.7 mg/dL (ref >39.00)
LDL Cholesterol: 78 mg/dL (ref 0–99)
NonHDL: 97.9
Total CHOL/HDL Ratio: 3
Triglycerides: 101 mg/dL (ref 0.0–149.0)
VLDL: 20.2 mg/dL (ref 0.0–40.0)

## 2023-06-15 LAB — CK: Total CK: 88 U/L (ref 7–232)

## 2023-06-15 NOTE — Patient Instructions (Signed)
It was great to see you!  Blood work today  Close follow-up with Alusio and possibly neurosurgery  I will be in touch with blood work and results  Take care,  Jarold Motto PA-C

## 2023-08-18 ENCOUNTER — Other Ambulatory Visit: Payer: Self-pay

## 2023-08-18 ENCOUNTER — Emergency Department (HOSPITAL_COMMUNITY)
Admission: EM | Admit: 2023-08-18 | Discharge: 2023-08-19 | Disposition: A | Discharge status: Home / Self Care | Payer: Medicare HMO | Attending: Emergency Medicine | Admitting: Emergency Medicine

## 2023-08-18 ENCOUNTER — Emergency Department (HOSPITAL_COMMUNITY): Payer: Medicare HMO

## 2023-08-18 DIAGNOSIS — Z8673 Personal history of transient ischemic attack (TIA), and cerebral infarction without residual deficits: Secondary | ICD-10-CM | POA: Diagnosis not present

## 2023-08-18 DIAGNOSIS — R61 Generalized hyperhidrosis: Secondary | ICD-10-CM | POA: Diagnosis not present

## 2023-08-18 DIAGNOSIS — R42 Dizziness and giddiness: Secondary | ICD-10-CM | POA: Insufficient documentation

## 2023-08-18 DIAGNOSIS — I6782 Cerebral ischemia: Secondary | ICD-10-CM | POA: Diagnosis not present

## 2023-08-18 DIAGNOSIS — Z79899 Other long term (current) drug therapy: Secondary | ICD-10-CM | POA: Diagnosis not present

## 2023-08-18 DIAGNOSIS — Z7901 Long term (current) use of anticoagulants: Secondary | ICD-10-CM | POA: Diagnosis not present

## 2023-08-18 DIAGNOSIS — R600 Localized edema: Secondary | ICD-10-CM | POA: Insufficient documentation

## 2023-08-18 DIAGNOSIS — R002 Palpitations: Secondary | ICD-10-CM | POA: Diagnosis not present

## 2023-08-18 LAB — APTT: aPTT: 39 s — ABNORMAL HIGH (ref 24–36)

## 2023-08-18 LAB — CBC
HCT: 44.7 % (ref 39.0–52.0)
Hemoglobin: 14.6 g/dL (ref 13.0–17.0)
MCH: 30.4 pg (ref 26.0–34.0)
MCHC: 32.7 g/dL (ref 30.0–36.0)
MCV: 93.1 fL (ref 80.0–100.0)
Platelets: 222 10*3/uL (ref 150–400)
RBC: 4.8 MIL/uL (ref 4.22–5.81)
RDW: 14.6 % (ref 11.5–15.5)
WBC: 7.5 10*3/uL (ref 4.0–10.5)
nRBC: 0 % (ref 0.0–0.2)

## 2023-08-18 LAB — DIFFERENTIAL
Abs Immature Granulocytes: 0.03 10*3/uL (ref 0.00–0.07)
Basophils Absolute: 0.1 10*3/uL (ref 0.0–0.1)
Basophils Relative: 1 %
Eosinophils Absolute: 0.2 10*3/uL (ref 0.0–0.5)
Eosinophils Relative: 3 %
Immature Granulocytes: 0 %
Lymphocytes Relative: 11 %
Lymphs Abs: 0.8 10*3/uL (ref 0.7–4.0)
Monocytes Absolute: 0.7 10*3/uL (ref 0.1–1.0)
Monocytes Relative: 9 %
Neutro Abs: 5.7 10*3/uL (ref 1.7–7.7)
Neutrophils Relative %: 76 %

## 2023-08-18 LAB — COMPREHENSIVE METABOLIC PANEL
ALT: 17 U/L (ref 0–44)
AST: 24 U/L (ref 15–41)
Albumin: 3.6 g/dL (ref 3.5–5.0)
Alkaline Phosphatase: 94 U/L (ref 38–126)
Anion gap: 7 (ref 5–15)
BUN: 20 mg/dL (ref 8–23)
CO2: 24 mmol/L (ref 22–32)
Calcium: 9.7 mg/dL (ref 8.9–10.3)
Chloride: 109 mmol/L (ref 98–111)
Creatinine, Ser: 1.31 mg/dL — ABNORMAL HIGH (ref 0.61–1.24)
GFR, Estimated: 54 mL/min — ABNORMAL LOW (ref >60)
Glucose, Bld: 125 mg/dL — ABNORMAL HIGH (ref 70–99)
Potassium: 5.5 mmol/L — ABNORMAL HIGH (ref 3.5–5.1)
Sodium: 140 mmol/L (ref 135–145)
Total Bilirubin: 0.8 mg/dL (ref 0.0–1.2)
Total Protein: 6.5 g/dL (ref 6.5–8.1)

## 2023-08-18 LAB — ETHANOL: Alcohol, Ethyl (B): <10 mg/dL (ref <10)

## 2023-08-18 LAB — PROTIME-INR
INR: 1.5 — ABNORMAL HIGH (ref 0.8–1.2)
Prothrombin Time: 18.8 s — ABNORMAL HIGH (ref 11.4–15.2)

## 2023-08-18 LAB — CBG MONITORING, ED: Glucose-Capillary: 118 mg/dL — ABNORMAL HIGH (ref 70–99)

## 2023-08-18 MED ORDER — SODIUM CHLORIDE 0.9% FLUSH
3.0000 mL | Freq: Once | INTRAVENOUS | Status: AC
  Administered 2023-08-19: 3 mL via INTRAVENOUS

## 2023-08-18 NOTE — ED Triage Notes (Signed)
 Pt here POV with intermittent episodes of dizziness and diaphoresis since yesterday. Endorses cold sweats.

## 2023-08-18 NOTE — ED Notes (Signed)
 Transported to CT

## 2023-08-19 LAB — TROPONIN I (HIGH SENSITIVITY): Troponin I (High Sensitivity): 13 ng/L (ref <18)

## 2023-08-19 LAB — BASIC METABOLIC PANEL
Anion gap: 11 (ref 5–15)
BUN: 25 mg/dL — ABNORMAL HIGH (ref 8–23)
CO2: 25 mmol/L (ref 22–32)
Calcium: 9.8 mg/dL (ref 8.9–10.3)
Chloride: 107 mmol/L (ref 98–111)
Creatinine, Ser: 1.36 mg/dL — ABNORMAL HIGH (ref 0.61–1.24)
GFR, Estimated: 52 mL/min — ABNORMAL LOW (ref >60)
Glucose, Bld: 103 mg/dL — ABNORMAL HIGH (ref 70–99)
Potassium: 5 mmol/L (ref 3.5–5.1)
Sodium: 143 mmol/L (ref 135–145)

## 2023-08-19 MED ORDER — SODIUM CHLORIDE 0.9 % IV BOLUS
1000.0000 mL | Freq: Once | INTRAVENOUS | Status: AC
  Administered 2023-08-19: 1000 mL via INTRAVENOUS

## 2023-08-19 MED ORDER — METOPROLOL SUCCINATE ER 25 MG PO TB24
25.0000 mg | ORAL_TABLET | Freq: Every day | ORAL | Status: DC
  Administered 2023-08-19: 25 mg via ORAL
  Filled 2023-08-19: qty 1

## 2023-08-19 MED ORDER — METOPROLOL TARTRATE 25 MG PO TABS
25.0000 mg | ORAL_TABLET | Freq: Once | ORAL | Status: DC
  Filled 2023-08-19: qty 1

## 2023-08-19 NOTE — ED Provider Notes (Signed)
83 year old male history of atrial fibrillation anticoagulation, CKD, GERD, hyperlipidemia, hypertension, prior stroke without residual deficit presenting for dizziness.  Patient has been here in the waiting room for over 25 hours.  His symptoms initially started Monday afternoon and evening.  Both times he was sitting down when he felt dizzy.  He describes it as feeling like he was going to pass out.  It improved when he put his head down after couple minutes.  He had some diaphoresis with the second episode but no nausea, no chest pain or shortness of breath.  No syncope.  He never had any headache or vision changes or weakness or numbness.  Wife was with him and did not note any neurologic changes and states he was very different than his prior stroke.  While he is been here he has had no recurrence of symptoms and he feels well.  He has some baseline gait difficulties due to neuropathy but no other abnormalities.  He really wants to go home.  He does state he does not eat or drink a whole lot of water and wife wonders if he could be dehydrated.  His lab appears notable for creatinine 1.3 which is close to his baseline.  He has mild hyperkalemia but no EKG changes or ischemic changes on EKG.  Does have PVC.  Will repeat his BMP after some fluids here and check a troponin although seems less consistent with ACS.  I suspect this is more likely presyncope rather than vertigo.  Consider possible TIA or stroke but given complete resolution of symptoms and the description of his symptoms seems less likely.  CT head here is without acute findings but does show his prior chronic strokes.  Repeat BMP shows resolution of hyperkalemia without any specific intervention other than fluids.  Suspect he was somewhat dry normal if this may be related to presyncope.  He is hypertensive here but missed his home metoprolol yesterday.  Patient would really like to go home, has been asymptomatic for over 24 hours.  This was given  here.  Recommend he follow-up closely with his PCP, and neurology.  Strict return precautions given.  Patient is comfortable with this plan.   Laurence Spates, MD 08/20/23 [phone]

## 2023-08-19 NOTE — ED Notes (Signed)
 EDP at bedside

## 2023-08-19 NOTE — Discharge Instructions (Addendum)
 Today you were seen for dizziness.  Please follow-up with your PCP to ensure your potassium is within normal range and follow-up for incidental findings on your head CT.  Please schedule an appointment with cardiology for further evaluation and treatment.  Thank you for letting us  treat you today. After reviewing your labs and imaging, I feel you are safe to go home. Please follow up with your PCP in the next several days and provide them with your records from this visit. Return to the Emergency Room if pain becomes severe or symptoms worsen.

## 2023-08-19 NOTE — ED Notes (Signed)
 Patient discharged by this RN. Patient ambulatory to lobby.

## 2023-08-19 NOTE — ED Provider Notes (Signed)
[place] EMERGENCY DEPARTMENT AT [facility] Provider Note   CSN: [id number] Arrival date & time: 08/18/23  2841     History  Chief Complaint  Patient presents with   Dizziness    Elijah [name] is a 83 y.o. male past medical history significant for A-fib and CVA presents today for intermittent episodes of dizziness and 1 with diaphoresis x 1 day.  Patient states that the first episode occurred when he was sitting down on the phone and noted that he was frustrated prior to this episode.  Patient states that the second episode was while he was sitting at the kitchen table and doing sudoku.  After 1 of these episodes the patient also noted some heart palpitations.  Patient denies numbness, tingling, weakness, facial droop, nausea, vomiting, shortness of breath, or chest pain.  Patient is anticoagulated on Xarelto.   Dizziness      Home Medications Prior to Admission medications   Medication Sig Start Date End Date Taking? Authorizing Provider  acetaminophen (TYLENOL) 650 MG CR tablet Take 650 mg by mouth 2 (two) times daily.   Yes [provider]  famotidine (PEPCID) 20 MG tablet TAKE 1 TABLET(20 MG) BY MOUTH TWICE DAILY 04/13/23  Yes Shelva Majestic, MD  metoprolol succinate (TOPROL-XL) 25 MG 24 hr tablet TAKE 1 TABLET BY MOUTH DAILY WITH OR IMMEDIATELY FOLLOWING A MEAL 04/15/23  Yes Shelva Majestic, MD  Multiple Vitamin (MULTIVITAMIN WITH MINERALS) TABS tablet Take 1 tablet by mouth daily.   Yes [provider]  VITAMIN D PO Take 1 tablet by mouth daily.   Yes [provider]  XARELTO 20 MG TABS tablet TAKE 1 TABLET(20 MG) BY MOUTH DAILY 03/24/23  Yes Shelva Majestic, MD  rosuvastatin (CRESTOR) 10 MG tablet Take 1 tablet (10 mg total) by mouth daily. Patient not taking: Reported on 06/15/2023 01/15/23   Shelva Majestic, MD      Allergies    Atorvastatin, Ciprofloxacin, and Flagyl [metronidazole]    Review of Systems   Review of Systems   Constitutional:  Positive for diaphoresis.  Neurological:  Positive for dizziness.    Physical Exam Updated Vital Signs BP (!) 196/88   Pulse 74   Temp 98 F (36.7 C) (Oral)   Resp 18   SpO2 100%  Physical Exam Vitals and nursing note reviewed.  Constitutional:      General: He is not in acute distress.    Appearance: He is well-developed.  HENT:     Head: Normocephalic and atraumatic.     Right Ear: External ear normal.     Left Ear: External ear normal.     Nose: Nose normal.  Eyes:     Extraocular Movements: Extraocular movements intact.     Conjunctiva/sclera: Conjunctivae normal.  Cardiovascular:     Rate and Rhythm: Normal rate. Rhythm irregular.     Pulses: Normal pulses.     Heart sounds: Normal heart sounds. No murmur heard. Pulmonary:     Effort: Pulmonary effort is normal. No respiratory distress.     Breath sounds: Normal breath sounds.  Abdominal:     Palpations: Abdomen is soft.     Tenderness: There is no abdominal tenderness.  Musculoskeletal:        General: No swelling.     Cervical back: Normal range of motion and neck supple.     Right lower leg: Edema present.     Left lower leg: Edema present.  Comments: Bilateral ankle edema without pitting  Skin:    General: Skin is warm and dry.     Capillary Refill: Capillary refill takes less than 2 seconds.  Neurological:     General: No focal deficit present.     Mental Status: He is alert.     Cranial Nerves: No cranial nerve deficit.     Motor: No weakness.  Psychiatric:        Mood and Affect: Mood normal.     ED Results / Procedures / Treatments   Labs (all labs ordered are listed, but only abnormal results are displayed) Labs Reviewed  PROTIME-INR - Abnormal; Notable for the following components:      Result Value   Prothrombin Time 18.8 (*)    INR 1.5 (*)    All other components within normal limits  APTT - Abnormal; Notable for the following components:   aPTT 39 (*)    All  other components within normal limits  COMPREHENSIVE METABOLIC PANEL - Abnormal; Notable for the following components:   Potassium 5.5 (*)    Glucose, Bld 125 (*)    Creatinine, Ser 1.31 (*)    GFR, Estimated 54 (*)    All other components within normal limits  BASIC METABOLIC PANEL - Abnormal; Notable for the following components:   Glucose, Bld 103 (*)    BUN 25 (*)    Creatinine, Ser 1.36 (*)    GFR, Estimated 52 (*)    All other components within normal limits  CBG MONITORING, ED - Abnormal; Notable for the following components:   Glucose-Capillary 118 (*)    All other components within normal limits  CBC  DIFFERENTIAL  ETHANOL  TROPONIN I (HIGH SENSITIVITY)  TROPONIN I (HIGH SENSITIVITY)    EKG EKG Interpretation Date/Time:  Tuesday August 18 2023 08:23:56 EST Ventricular Rate:  69 PR Interval:    QRS Duration:  138 QT Interval:  458 QTC Calculation: 490 R Axis:   123  Text Interpretation: Atrial fibrillation with premature ventricular or aberrantly conducted complexes Right bundle branch block Left posterior fascicular block Bifascicular block T wave abnormality, consider inferior ischemia Abnormal ECG When compared with ECG of 13-May-2016 17:57, PREVIOUS ECG IS PRESENT Confirmed by Fulton Reek [phone]) on 08/19/2023 9:48:02 AM  Radiology CT HEAD WO CONTRAST Result Date: 08/18/2023 CLINICAL DATA:  Provided history: Syncope/presyncope, cerebrovascular cause suspected. EXAM: CT HEAD WITHOUT CONTRAST TECHNIQUE: Contiguous axial images were obtained from the base of the skull through the vertex without intravenous contrast. RADIATION DOSE REDUCTION: This exam was performed according to the departmental dose-optimization program which includes automated exposure control, adjustment of the mA and/or kV according to patient size and/or use of iterative reconstruction technique. COMPARISON:  Non-contrast head CT 05/13/2016. Brain MRI 05/14/2016. CT angiogram head/neck  05/14/2016. FINDINGS: Brain: Generalized cerebral atrophy. Chronic right MCA territory cortical/subcortical infarcts affecting portions of the frontal lobe/insula, parietal lobe, temporal lobe and temporo-occipital junction Background mild patchy and ill-defined hypoattenuation within the cerebral white matter, nonspecific but compatible chronic small vessel ischemic disease. Unchanged small chronic infarct within the left cerebellar hemisphere. There is no acute intracranial hemorrhage. No acute demarcated cortical infarct. No extra-axial fluid collection. No evidence of an intracranial mass. No midline shift. Vascular: No hyperdense vessel.  Atherosclerotic calcifications. Skull: No calvarial fracture or aggressive osseous lesion. Sinuses/Orbits: No mass or acute finding within the imaged orbits. 15 mm mucous retention cyst or polyp within the left maxillary sinus. IMPRESSION: 1. No evidence of an acute intracranial  abnormality. 2. Chronic cortical/subcortical right MCA territory infarcts, progressed in extent since the brain MRI of 05/14/2016. 3. Background mild cerebral white matter chronic small vessel ischemic disease. 4. Unchanged small chronic infarct within the left cerebellar hemisphere. 5. Generalized cerebral atrophy. 6. 15 mm mucous retention cyst or polyp within the left maxillary sinus. Electronically Signed   By: Jackey Loge D.O.   On: 08/18/2023 09:06    Procedures Procedures    Medications Ordered in ED Medications  metoprolol succinate (TOPROL-XL) 24 hr tablet 25 mg (25 mg Oral Given 08/19/23 1203)  sodium chloride flush (NS) 0.9 % injection 3 mL (3 mLs Intravenous Given 08/19/23 1023)  sodium chloride 0.9 % bolus 1,000 mL (0 mLs Intravenous Stopped 08/19/23 1154)    ED Course/ Medical Decision Making/ A&P Clinical Course as of 08/19/23 1208  Wed Aug 19, 2023  0947 Hx afib, CVA, dizziness with diaphoresis x 2 while sitting down.  Some palpitations.  No SOB/CP, no neuro symptoms.  [JD]    Clinical Course User Index [JD] Laurence Spates, MD                                 Medical Decision Making Amount and/or Complexity of Data Reviewed Labs: ordered. Radiology: ordered.   This patient presents to the ED with chief complaint(s) of episodes of dizziness or diaphoresis with pertinent past medical history of A-fib and CVA which further complicates the presenting complaint. The complaint involves an extensive differential diagnosis and also carries with it a high risk of complications and morbidity.    The differential diagnosis includes CVA, ACS, arrhythmia, electrolyte abnormality, orthostatic hypotension  Additional history obtained: Additional history obtained from spouse Records reviewed cardiology office notes  ED Course and Reassessment:   Independent labs interpretation:  The following labs were independently interpreted:  CBC: No notable findings CMP: Mildly elevated potassium of 5.5, mildly elevated creatinine at 1.31 Potassium of 5 on second BMP Ethanol: Less than 10 APTT: 39 Pro time-INR: 18.8 and 1.5 EKG: Atrial fibrillation with premature ventricular or aberrantly conducted complexes, Right bundle branch block, Left posterior fascicular Troponin: 13  Orthostatic vital signs: Orthostatic Lying BP- Lying: 177/94 (!) Pulse- Lying: 76 Orthostatic Sitting BP- Sitting: 192/88 Pulse- Sitting: 87 Orthostatic Standing at 0 minutes BP- Standing at 0 minutes: 195/98 (!) Pulse- Standing at 0 minutes: 50 Orthostatic Standing at 3 minutes BP- Standing at 3 minutes: 196/99 (!) Pulse- Standing at 3 minutes: 50   Independent visualization of imaging: - I independently visualized the following imaging with scope of interpretation limited to determining acute life threatening conditions related to emergency care: CT head Noncon, which revealed no evidence of acute intracranial abnormality  Consultation: - Consulted or discussed management/test  interpretation w/ external professional: None  Consideration for admission or further workup: Patient considered for admission further workup however patient's vital signs, physical exam, labs, and imaging have all been reassuring.  Patient symptoms likely due to dehydration.  Patient has been symptom-free during his entire almost 28-hour stay in the ER.  Patient did have incidental findings for which he will follow-up with his PCP soon as possible.  Patient given outpatient cardiology referral for possible Holter monitor and further evaluation.        Final Clinical Impression(s) / ED Diagnoses Final diagnoses:  Dizziness    Rx / DC Orders ED Discharge Orders          Ordered  Ambulatory referral to Cardiology       Comments: If you have not heard from the Cardiology office within the next 72 hours please call [phone].   08/19/23 1134              Dolphus Jenny, PA-C 08/19/23 1209    Laurence Spates, MD 08/20/23 [phone]

## 2023-09-06 NOTE — Progress Notes (Signed)
 Cardiology Office Note Date:  09/06/2023  Patient ID:  Elijah [name], DOB 1941-07-30, MRN [id number] PCP:  Katrinka Garnette KIDD, MD  Cardiologist:  Dr. Kelsie >> Dr. Nancey   Chief Complaint:  post ER visit  History of Present Illness: Elijah [name] is a 83 y.o. male with history of permanent AFib, HTN, HLD, CRI stage III, peripheral neuropathy, prostate cancer s/p surgery with subsequent problems with fecal incontinence at times Stroke RBBB  Seeing Dr. Sheffield EP team over the years AFib described as permanent  I saw him Sept 2021 He continues to do well. Denies any cardiac awareness, no CP or palpitations [place] [facility] 2 [place] and feels like he has good exertional capacity. No SOB or DOE No difficulties with his ADLs Sees his PMD Q 38mo No bleeding or signs of bleeding  he has no concerns today Rate controlled afib, no changes were made  Had a hiatus from the office after this until seeing Dr. Nancey  06/03/22, feeling well, rate controlled, maintained on OAC.  Had hip surgery Oct 2024  ER visit 08/19/23 for a couple episodes of dizziness, associated with diaphoresis Both seated, once frustrated on the phone, the other while working on a puzzle Orthostatics negative Labs noted K+ of 5.5 on repeat was 5, HS Trop 13 EKG AFib rate controlled with some PVC CT head without acute findings but does show his prior chronic strokes  Suspect dehydrated  TODAY He has not had recurrent symptoms Reports the day prior to going to the ER was seated working in the computer when he started to feel weak, got very lightheaded, then seemed to settle away The next day seated doing crossword puzzle with the same symptoms. No syncope His wife reported that he was cold/sweaty  No trigger that he notes, no missed or accidental extra doses of meds No illness, symptoms of illness  No CP, SOB  No bleeding or signs of bleeding  He goes to the gym daily, walks and does  machine Reports excellent exertional capacit  Past Medical History:  Diagnosis Date   Adenomatous colon polyp    Arthritis 09/10/2011   rt. hip, knee, hands   Cancer (HCC) 09/10/2011   dx. Prostate cancer-bx. 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   Peripheral neuropathy    Permanent atrial fibrillation (HCC) 09/10/2011   Pre-diabetes    Prostate cancer (HCC)    Stroke [facility])     Past Surgical History:  Procedure Laterality Date   COLONOSCOPY  04/2003, 06/05/2011   diverticulosis, internal and external hemorrhoids 2004 and 2012, 2 small polyps 2012   EYE SURGERY  09/10/2011   Only stitches to close cut, and observation of hematoma from injury, cataracts bilateral   INGUINAL HERNIA REPAIR Right 06/16/2019   Procedure: OPEN RIGHT INGUINAL HERNIA REPAIR;  Surgeon: Ethyl Alm, MD;  Location[facility] OR;  Service: General;  Laterality: Right;  OPEN RIGHT INGUINAL HERNIA REPAIR   LUMBAR LAMINECTOMY/DECOMPRESSION MICRODISCECTOMY N/A 09/23/2021   Procedure: Laminectomy and Foraminotomy - Lumbar Two- Lumbar Three - Lumbar Three- Lumbar Four - Lumbar Four- Lumbar Five;  Surgeon: Onetha Kuba, MD;  Location: [facility] OR;  Service: Neurosurgery;  Laterality: N/A;  Laminectomy and Foraminotomy - Lumbar Two- Lumbar Three - Lumbar Three- Lumbar Four - Lumbar Four- Lumbar Five   melanoma surgery      ROBOT ASSISTED LAPAROSCOPIC RADICAL PROSTATECTOMY  09/15/2011   Procedure: ROBOTIC ASSISTED LAPAROSCOPIC RADICAL PROSTATECTOMY LEVEL 2;  Surgeon: Noretta Ferrara, MD;  Location: [facility] ORS;  Service: Urology;  Laterality: N/A;         TONSILLECTOMY     TOTAL HIP ARTHROPLASTY Right 05/18/2023   Procedure: TOTAL HIP ARTHROPLASTY ANTERIOR  APPROACH;  Surgeon: Melodi Lerner, MD;  Location: [facility] ORS;  Service: Orthopedics;  Laterality: Right;    Current Outpatient Medications  Medication Sig Dispense Refill   acetaminophen  (TYLENOL ) 650 MG CR tablet Take 650 mg by mouth 2 (two) times daily.     famotidine  (PEPCID ) 20 MG tablet TAKE 1 TABLET(20 MG) BY MOUTH TWICE DAILY 180 tablet 3   metoprolol  succinate (TOPROL -XL) 25 MG 24 hr tablet TAKE 1 TABLET BY MOUTH DAILY WITH OR IMMEDIATELY FOLLOWING A MEAL 90 tablet 3   Multiple Vitamin (MULTIVITAMIN WITH MINERALS) TABS tablet Take 1 tablet by mouth daily.     rosuvastatin  (CRESTOR ) 10 MG tablet Take 1 tablet (10 mg total) by mouth daily. (Patient not taking: Reported on 06/15/2023) 30 tablet 5   VITAMIN D  PO Take 1 tablet by mouth daily.     XARELTO  20 MG TABS tablet TAKE 1 TABLET(20 MG) BY MOUTH DAILY 90 tablet 1   No current facility-administered medications for this visit.    Allergies:   Atorvastatin , Ciprofloxacin , and Flagyl  [metronidazole ]   Social History:  The patient  reports that he quit smoking about 55 years ago. His smoking use included cigarettes. He has never used smokeless tobacco. He reports that he does not drink alcohol and does not use drugs.   Family History:  The patient's family history includes Alzheimer's disease in his paternal grandmother; Diabetes in his mother; Diverticulosis in his mother; Heart attack in his paternal grandfather; Heart attack (age of onset: 81) in his father; Hypertension in his mother; Prostate cancer in his brother.  ROS:  Please see the history of present illness.  All other systems are reviewed and otherwise negative.   PHYSICAL EXAM:  VS:  There were no vitals taken for this visit. BMI: There is no height or weight on file to calculate BMI. Well nourished, well developed, in no acute distress  HEENT: normocephalic, atraumatic  Neck: no JVD, carotid bruits or masses Cardiac: irreg-irreg, no significant murmurs, no rubs, or  gallops Lungs: CTA b/l, no wheezing, rhonchi or rales  Abd: soft, nontender MS: no deformity or atrophy Ext:  no edema  Skin: warm and dry, no rash Neuro:  No gross deficits appreciated Psych: euthymic mood, full affect   EKG:  in the ER, 1/24 and 25/25 and reviewed by myself  AFib 69bpm, RBBB, PVC AFib 74bpm, RBBB, PVCs   05/15/16: TTE Study Conclusions - Left ventricle: The cavity size was normal. Wall thickness was   increased in a pattern of mild LVH. Systolic function was normal.   The estimated ejection fraction was in the range of 55% to 60%.   Wall motion was normal; there were no regional wall motion   abnormalities. - Mitral valve: Calcified annulus. There was mild regurgitation. - Left atrium: The atrium was severely dilated. - Right ventricle: The cavity size was mildly dilated. - Right atrium: The atrium was severely dilated. Impressions: - Normal LV  systolic function; mild LVH; severe biatrial  (LA 49mm)   enlargement; mild RVE; mild MR; trace TR.  Recent Labs: 11/18/2022: TSH 0.96 08/18/2023: ALT 17; Hemoglobin 14.6; Platelets 222 08/19/2023: BUN 25; Creatinine, Ser 1.36; Potassium 5.0; Sodium 143  04/16/2023: Direct LDL 65.0 06/15/2023: Cholesterol 138; HDL 39.70; LDL Cholesterol 78; Total CHOL/HDL Ratio 3; Triglycerides 101.0; VLDL 20.2   CrCl cannot be calculated (Unknown ideal weight.).   Wt Readings from Last 3 Encounters:  06/15/23 207 lb (93.9 kg)  05/18/23 209 lb 7 oz (95 kg)  05/07/23 209 lb 7 oz (95 kg)     Other studies reviewed: Additional studies/records reviewed today include: summarized above  ASSESSMENT AND PLAN:  1. Long hx of permanent AFib      rate controlled and asymptomatic     CHA2DS2Vasc is 4,  on Xarelto ,  appropriately dosed        2. HTN     Looks good, no changes  3. Near syncope None since the ER visit Orthostatic vitals flat Discussed getting his carotid checked, but he reports his PMD checks those regularly and  doesn't want to pursue that (no bruits on exam) Monitor, to monitor PVC burden and HRs, he does have some conduction system disease. he is reluctant but agreeable  4. Secondary hypercoagulable state    Disposition:  back in 2-3 months sooner if needed     Current medicines are reviewed at length with the patient today.  The patient did not have any concerns regarding medicines.  Bonney [name], PA-C 09/06/2023 1:42 PM     [facility] [address] [place] [address] [phone] (office)  [phone] (fax)

## 2023-09-08 ENCOUNTER — Other Ambulatory Visit: Payer: Self-pay | Admitting: Physician Assistant

## 2023-09-08 ENCOUNTER — Telehealth: Payer: Self-pay | Admitting: Cardiovascular Disease

## 2023-09-08 ENCOUNTER — Other Ambulatory Visit (INDEPENDENT_AMBULATORY_CARE_PROVIDER_SITE_OTHER): Payer: Medicare HMO

## 2023-09-08 ENCOUNTER — Encounter: Payer: Self-pay | Admitting: Family Medicine

## 2023-09-08 ENCOUNTER — Ambulatory Visit: Payer: Medicare HMO | Attending: Physician Assistant | Admitting: Physician Assistant

## 2023-09-08 VITALS — Ht 75.0 in | Wt 212.0 lb

## 2023-09-08 DIAGNOSIS — I4510 Unspecified right bundle-branch block: Secondary | ICD-10-CM | POA: Diagnosis not present

## 2023-09-08 DIAGNOSIS — I4821 Permanent atrial fibrillation: Secondary | ICD-10-CM

## 2023-09-08 DIAGNOSIS — R55 Syncope and collapse: Secondary | ICD-10-CM

## 2023-09-08 DIAGNOSIS — D6869 Other thrombophilia: Secondary | ICD-10-CM | POA: Diagnosis not present

## 2023-09-08 DIAGNOSIS — R42 Dizziness and giddiness: Secondary | ICD-10-CM

## 2023-09-08 DIAGNOSIS — I493 Ventricular premature depolarization: Secondary | ICD-10-CM

## 2023-09-08 DIAGNOSIS — I10 Essential (primary) hypertension: Secondary | ICD-10-CM | POA: Diagnosis not present

## 2023-09-08 NOTE — Telephone Encounter (Signed)
 50-70 bpm average of 58 AFIB at 944 am   1107 am AFIB hr 46-89 bpm      Cardiac Monitor Alert  Date of alert:  09/08/2023   Patient Name: Elijah [name]  DOB: [birth year]  MRN: [id number]   [facility] Cardiologist: None  [facility] EP:  Elijah FORBES [place], Elijah [name]    Monitor Information: Long Term Monitor-Live Telemetry [ZioAT]  Reason:  Syncope Ordering provider:  Charlies Arthur   Alert Atrial Fibrillation/Flutter This is the 2nd alert for this rhythm.  The patient has a hx of Atrial Fibrillation/Flutter.  The patient is not currently on anticoagulation.  Anticoagulation medication as of 09/08/2023           XARELTO  20 MG TABS tablet TAKE 1 TABLET(20 MG) BY MOUTH DAILY       Next Cardiology Appointment   Date:  12/07/2023  Provider:  Charlies Arthur  The patient was contacted today.  He is asymptomatic. Spoke with patient and the first alert he was driving home.  2nd alert he was at home sitting at the computer.   Teruo Stilley JONELLE Kleine, LPN  02/05/7973 87:78 PM

## 2023-09-08 NOTE — Telephone Encounter (Signed)
Returned call to [facility] because we have not received strips for the 2 alerts they received from patient's monitor.

## 2023-09-08 NOTE — Patient Instructions (Addendum)
Medication Instructions:   Your physician recommends that you continue on your current medications as directed. Please refer to the Current Medication list given to you today.   *If you need a refill on your cardiac medications before your next appointment, please call your pharmacy*   Lab Work:  NONE ORDERED  TODAY    If you have labs (blood work) drawn today and your tests are completely normal, you will receive your results only by: MyChart Message (if you have MyChart) OR A paper copy in the mail If you have any lab test that is abnormal or we need to change your treatment, we will call you to review the results.   Testing/Procedures: Your physician has recommended that you wear an event monitor. Event monitors are medical devices that record the heart's electrical activity. Doctors most often [place] these monitors to diagnose arrhythmias. Arrhythmias are problems with the speed or rhythm of the heartbeat. The monitor is a small, portable device. You can wear one while you do your normal daily activities. This is usually used to diagnose what is causing palpitations/syncope (passing out).     Follow-Up: At [facility] ([facility]), you and your health needs are our priority.  As part of our continuing mission to provide you with exceptional heart care, we have created designated Provider Care Teams.  These Care Teams include your primary Cardiologist (physician) and Advanced Practice Providers (APPs -  Physician Assistants and Nurse Practitioners) who all work together to provide you with the care you need, when you need it.  We recommend signing up for the patient portal called "MyChart".  Sign up information is provided on this After Visit Summary.  MyChart is used to connect with patients for Virtual Visits (Telemedicine).  Patients are able to view lab/test results, encounter notes, upcoming appointments, etc.  Non-urgent messages can be sent to your provider as well.   To learn more  about what you can do with MyChart, go to [email or web address].    Your next appointment:    2-3 month(s)  ( CONTACT  CASSIE HALL/ ANGELINE HAMMER FOR EP SCHEDULING ISSUES )   Provider:   York Pellant, MD or Francis Dowse, PA-C    Other Instructions     ZIO AT Long term monitor-Live Telemetry  Your physician has requested you wear a ZIO patch monitor for 3  days.  This is a single patch monitor. [employer] supplies one patch monitor per enrollment. Additional  stickers are not available.  Please do not apply patch if you will be having a Nuclear Stress Test, Echocardiogram, Cardiac CT, MRI,  or Chest Xray during the period you would be wearing the monitor. The patch cannot be worn during  these tests. You cannot remove and re-apply the ZIO AT patch monitor.  Your ZIO patch monitor will be mailed 3 day USPS to your address on file. It may take 3-5 days to  receive your monitor after you have been enrolled.  Once you have received your monitor, please review the enclosed instructions. Your monitor has  already been registered assigning a specific monitor serial # to you.   Billing and Patient Assistance Program information  Meredeth Ide has been supplied with any insurance information on record for billing. [employer] offers a sliding scale Patient Assistance Program for patients without insurance, or whose  insurance does not completely cover the cost of the ZIO patch monitor. You must apply for the  Patient Assistance Program to qualify for the discounted rate. To apply, call  [employer] at [phone],  select option 4, select option 2 , ask to apply for the Patient Assistance Program, (you can request an  interpreter if needed). [employer] will ask your household income and how many people are in your  household. [employer] will quote your out-of-pocket cost based on this information. They will also be able  to set up a 12 month interest free payment plan if needed.  Applying the monitor    Shave hair from upper left chest.  Hold the abrader disc by orange tab. Rub the abrader in 40 strokes over left upper chest as indicated in  your monitor instructions.  Clean area with 4 enclosed alcohol pads. Use all pads to ensure the area is cleaned thoroughly. Let  dry.  Apply patch as indicated in monitor instructions. Patch will be placed under collarbone on left side of  chest with arrow pointing upward.  Rub patch adhesive wings for 2 minutes. Remove the white label marked "1". Remove the white label  marked "2". Rub patch adhesive wings for 2 additional minutes.  While looking in a mirror, press and release button in center of patch. A small green light will flash 3-4  times. This will be your only indicator that the monitor has been turned on.  Do not shower for the first 24 hours. You may shower after the first 24 hours.  Press the button if you feel a symptom. You will hear a small click. Record Date, Time and Symptom in  the Patient Log.   Starting the Gateway  In your kit there is a Audiological scientist box the size of a cellphone. This is Buyer, retail. It transmits all your  recorded data to [facility]. This box must always stay within 10 feet of you. Open the box and push the *  button. There will be a light that blinks orange and then green a few times. When the light stops  blinking, the Gateway is connected to the ZIO patch. Call [employer] at [phone] to confirm your monitor is transmitting.  Returning your monitor  Remove your patch and place it inside the Gateway. In the lower half of the Gateway there is a white  bag with prepaid postage on it. Place Gateway in bag and seal. Mail package back to [place] as soon as  possible. Your physician should have your final report approximately 7 days after you have mailed back  your monitor. Call [facility] Customer Care at [phone] if you have questions regarding your ZIO AT  patch monitor. Call them immediately  if you see an orange light blinking on your monitor.  If your monitor falls off in less than 4 days, contact our Monitor department at [phone]. If your  monitor becomes loose or falls off after 4 days call [employer] at [phone] for suggestions on  securing your monitor

## 2023-09-08 NOTE — Telephone Encounter (Signed)
 Made aware of reports of AFib, rates 46-89bpm, tracings reviewed Known Afib No reported symptoms in d/w staff that spoke to the patient today Patient has permanent Afib, on anticoagulation with xarelto  Without symptoms continue to monitor No sustained rates in 40's These were transient  Charlies Arthur, PA-C

## 2023-09-08 NOTE — Progress Notes (Unsigned)
ZIO AT serial # [id number] from office inventory applied to patient. Dr. Nelly Laurence to read.

## 2023-09-08 NOTE — Telephone Encounter (Signed)
Abnormal results. Please advise   **was disconnected**

## 2023-09-11 ENCOUNTER — Ambulatory Visit (INDEPENDENT_AMBULATORY_CARE_PROVIDER_SITE_OTHER): Payer: Medicare HMO | Admitting: Family Medicine

## 2023-09-11 ENCOUNTER — Encounter: Payer: Self-pay | Admitting: Family Medicine

## 2023-09-11 VITALS — BP 134/74 | HR 64 | Temp 98.2°F | Ht 75.0 in | Wt 212.6 lb

## 2023-09-11 DIAGNOSIS — R42 Dizziness and giddiness: Secondary | ICD-10-CM | POA: Diagnosis not present

## 2023-09-11 DIAGNOSIS — R739 Hyperglycemia, unspecified: Secondary | ICD-10-CM

## 2023-09-11 DIAGNOSIS — I6523 Occlusion and stenosis of bilateral carotid arteries: Secondary | ICD-10-CM | POA: Diagnosis not present

## 2023-09-11 DIAGNOSIS — E785 Hyperlipidemia, unspecified: Secondary | ICD-10-CM

## 2023-09-11 DIAGNOSIS — N183 Chronic kidney disease, stage 3 unspecified: Secondary | ICD-10-CM

## 2023-09-11 DIAGNOSIS — I10 Essential (primary) hypertension: Secondary | ICD-10-CM

## 2023-09-11 DIAGNOSIS — I4821 Permanent atrial fibrillation: Secondary | ICD-10-CM | POA: Diagnosis not present

## 2023-09-11 DIAGNOSIS — Z131 Encounter for screening for diabetes mellitus: Secondary | ICD-10-CM

## 2023-09-11 NOTE — Progress Notes (Signed)
 Phone [phone] In person visit   Subjective:   Elijah [name] is a 83 y.o. year old very pleasant male patient who presents for/with See problem oriented charting Chief Complaint  Patient presents with   hosp f/u    Pt is here for hosp f/u due to dizziness and HTN   Past Medical History-  Patient Active Problem List   Diagnosis Date Noted   History of CVA (cerebrovascular accident) 05/14/2016    Priority: High   History of melanoma 12/04/2015    Priority: High   Atrial fibrillation (HCC) 04/06/2007    Priority: High   Hyperglycemia 08/13/2016    Priority: Medium    Thyroid  nodule     Priority: Medium    GERD (gastroesophageal reflux disease) 12/04/2015    Priority: Medium    CKD (chronic kidney disease), stage III (HCC) 12/07/2014    Priority: Medium    History of prostate cancer 09/23/2012    Priority: Medium    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    Priority: Medium    Essential hypertension 04/07/2007    Priority: Medium    Hyperlipidemia 04/06/2007    Priority: Medium    Double vision 12/14/2017    Priority: Low   Gross hematuria 02/12/2017    Priority: Low   Osteoarthritis of left hip 08/09/2014    Priority: Low   Actinic keratosis 06/05/2014    Priority: Low   Hx of adenomatous colonic polyps 06/05/2011    Priority: Low   Osteoarthritis of right hip 05/18/2023   Urinary and fecal incontinence 10/14/2021   Spinal stenosis of lumbar region 09/23/2021    Medications- reviewed and updated Current Outpatient Medications  Medication Sig Dispense Refill   acetaminophen  (TYLENOL ) 650 MG CR tablet Take 650 mg by mouth 2 (two) times daily.     famotidine  (PEPCID ) 20 MG tablet TAKE 1 TABLET(20 MG) BY MOUTH TWICE DAILY 180 tablet 3   metoprolol  succinate (TOPROL -XL) 25 MG 24 hr tablet TAKE 1 TABLET BY MOUTH DAILY WITH OR IMMEDIATELY FOLLOWING A MEAL 90 tablet 3   Multiple Vitamin (MULTIVITAMIN WITH MINERALS) TABS tablet Take 1 tablet by  mouth daily.     VITAMIN D  PO Take 1 tablet by mouth daily.     XARELTO  20 MG TABS tablet TAKE 1 TABLET(20 MG) BY MOUTH DAILY 90 tablet 1   No current facility-administered medications for this visit.     Objective:  BP 134/74   Pulse 64   Temp 98.2 F (36.8 C)   Ht 6' 3 (1.905 m)   Wt 212 lb 9.6 oz (96.4 kg)   SpO2 97%   BMI 26.57 kg/m  Gen: NAD, resting comfortably No carotid bruits but we discussed this does not rule out carotid stenosis CV: RRR no murmurs rubs or gallops Lungs: CTAB no crackles, wheeze, rhonchi Ext: trace to 1+  edema Skin: warm, dry No orthostatic symptoms with position change    Assessment and Plan    # Emergency department follow-up for dizziness episode/presyncope S: Patient was seen in the emergency department on August 18, 2023-he presented with dizziness.  Unfortunately had a prolonged visit in the waiting room of 25 hours.  His symptoms had started the day prior in the morning-he was sitting down when he felt dizzy on 2 separate occasions and felt like he may pass out.  He placed his head down and within a few minutes felt better.  He noted some diaphoresis with the second episode but no nausea,  chest pain or shortness of breath.  He did not have a syncopal episode but was presyncopal.  Denies any headache or vision changes or weakness or numbness.  Wife was with him and did not note any neurological changes.  During his time the emergency department he did not have recurrence of symptoms and felt well overall.  Has some baseline gait difficulties due to neuropathy but no other abnormalities.  Patient and wife wondered if he could have been dehydrated-he reported not drinking a lot of water .  Creatinine was close to baseline at 1.3.  He had very mild hyperkalemia but no EKG changes other than PVC.  He was given some fluids and a troponin was checked to rule out ACS.  It was thought more likely to be presyncope than vertigo.  Consideration of TIA or stroke  but given complete resolution of symptoms and description of the symptoms I thought less likely.  Head CT was completed without acute findings but did show prior strokes.  Repeat BMP did show resolution of hyperkalemia.  He was hypertensive in the emergency department but had missed his metoprolol .  He wanted to go home and given he was asymptomatic for 24 hours they thought that was reasonable and recommended follow-up with primary care. -today reflects back and had no sensation of motion. Episodes less than 5 minutes  He has already seen cardiology in follow-up on 09/08/2023-they evaluated his atrial fibrillation which was rate controlled and they believed him to be asymptomatic.  He was appropriately anticoagulated on Xarelto .  Hypertension looked good at that time.  He did not report any further presyncopal episodes since the ER visit.  They mentioned possible carotid ultrasound but he preferred to discuss that with me.  They are doing a cardiac monitor-he sent it off this morning- has not had any recurrent symptoms and didn't press button.  A/P: Presyncopal episodes x 2 from almost a month ago in the setting of possible dehydration without recurrence.  Encouraged hydration and regular meals-he reports drinking 2 maybe 3 glasses of water  a day along with some juice encouraged increasing to 4-day at least of water  -Patient just submitted cardiac monitor.  Will be interesting to see if any concerning rhythm issues - Small chance of TIA or stroke and offered MRI of the brain but he would like to hold off for now.  Does have history of stroke in 2017 and I am glad he sought care with this recent episode - He does agree to carotid ultrasound update-last done in 2016 with 1 to 39% stenosis bilaterally -He has a slight dry cough for several weeks-he wonders if this could be related to the heart and I told him I did not think so.  Lungs were clear today.  Offered chest x-ray but he declines for now-has follow-up  for physical in March and we can reconsider at that time if persistent    #Atrial fibrillation- sees Dr. Nancey S: Patient compliant with metoprolol  25 mg extended release for rate control.  Also uses Xarelto  20 mg for anticoagulation. A/P: appropriately anticoagulated and rate controlled- continue current medicine -Initial heart rate was 48 but on repeat was at 64-consider reducing metoprolol  but we opted out until results of cardiac monitoring   #Hyperlipidemia/history of stroke with LDL goal under 70 though likely was embolic S: Patient has experienced leg weakness and mental fog on atorvastatin  and leg weakness on rosuvastatin  10 mg daily.  On simvastatin  his lipids were above goal.   A/P: He  agrees to come by for labs before his physical-suspect poorly controlled-I believe he tolerated simvastatin  we may need to switch back to that and perhaps could add Zetia as well     #Hypertension S: Compliant with metoprolol  though primarily for rate control and 25 mg extended release A/P: Well-controlled on repeat-continue current medication    #CKD stage III S: GFR has been in the 50s.  Patient knows to avoid NSAIDs-also particularly with anticoagulation.  Would prefer H2 blockers over PPI A/P: Last GFR at 52-agrees to recheck before physical  # Hyperglycemia/insulin resistance/prediabetes- a1c up to 6.2 S:  Medication: none   Lab Results  Component Value Date   HGBA1C 6.2 04/16/2023   HGBA1C 6.2 11/18/2022   HGBA1C 5.8 (H) 05/02/2022  A/P: He is aware A1c may not be covered as has not been full 6 months but he would prefer to go ahead and do this with upcoming labs-this was ordered  # idiopathic neuropathy- does not want to use medication-contributes to balance issues  # GERD- famotidine  helpful. Better choice with CKD Stage III than PPI.  We considered switching back to PPI to see if that helps with his dry cough and he wants to hold off for now   %Thyroid  nodule- follows with Dr.  Von. We annually Update TSH with labs due to prior thyroid  nodule though had benign biopsy in 2018 and 2022.SABRA  He will be due for TSH with physical  Recommended follow up: Return for next already scheduled visit or sooner if needed. Future Appointments  Date Time Provider Department Center  10/23/2023  9:30 AM [facility]  10/27/2023  1:00 PM Katrinka Garnette KIDD, MD [facility]  12/07/2023  9:15 AM Leverne Charlies Helling, PA-C [facility]    Lab/Order associations:   ICD-10-CM   1. Dizziness  R42     2. Atherosclerosis of both carotid arteries  I65.23 VAS US  CAROTID    Lipid panel    Comprehensive metabolic panel    CBC with Differential/Platelet    3. Stage 3 chronic kidney disease, unspecified whether stage 3a or 3b CKD (HCC) Chronic N18.30     4. Permanent atrial fibrillation (HCC)  I48.21     5. Essential hypertension  I10     6. Hyperglycemia  R73.9 Hemoglobin A1c    7. Hyperlipidemia, unspecified hyperlipidemia type  E78.5 Lipid panel    Comprehensive metabolic panel    CBC with Differential/Platelet    TSH    8. Screening for diabetes mellitus  Z13.1 Hemoglobin A1c      Time Spent: 45 minutes of total time (4:40 PM- 5:25 PM) was spent on the date of the encounter performing the following actions: chart review prior to seeing the patient including emergency department review as well as review of cardiology note, obtaining history, performing a medically necessary exam, counseling on the appropriate workup and treatment plan, placing orders and coordinating his labs before his physical, and documenting in our EHR.    Return precautions advised.  Garnette Katrinka, MD

## 2023-09-11 NOTE — Patient Instructions (Addendum)
 Let us  know if you haven't heard about carotid artery ultrasound within 2 weeks  Schedule a lab visit at the check out desk a few days before your physical on march 25th. Return for future fasting labs meaning nothing but water  after midnight please. Ok to take your medications with water .   Recommended follow up: Return for next already scheduled visit or sooner if needed.

## 2023-09-14 ENCOUNTER — Inpatient Hospital Stay: Payer: Medicare HMO | Admitting: Family Medicine

## 2023-09-21 ENCOUNTER — Other Ambulatory Visit: Payer: Self-pay | Admitting: Family Medicine

## 2023-09-27 IMAGING — DX DG LUMBAR SPINE COMPLETE 4+V
5 series · 5 of 5 positions shown · non-contrast
Comparison: Lumbar radiograph 08/09/2014

CLINICAL DATA: Low back pain patient reports chronic worsening low
back pain radiating into both hips. No known injury.

EXAM:
LUMBAR SPINE - COMPLETE 4+ VIEW

[l-spine ap]
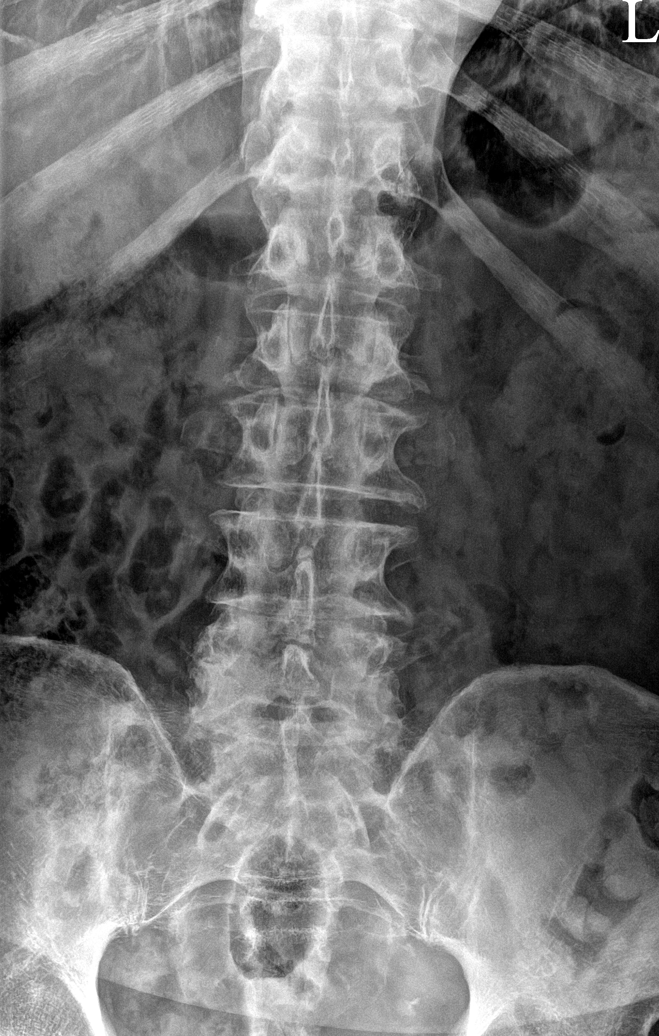

[l-spine obl (1 of 2)]
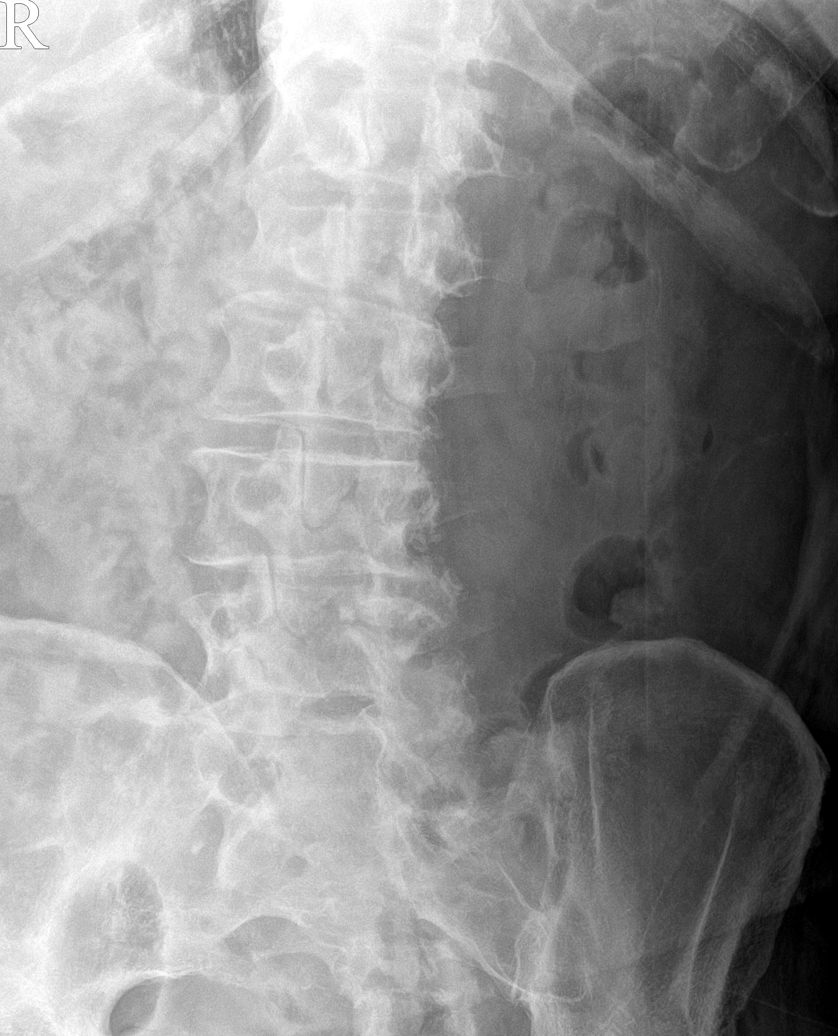

[l-spine obl (2 of 2)]
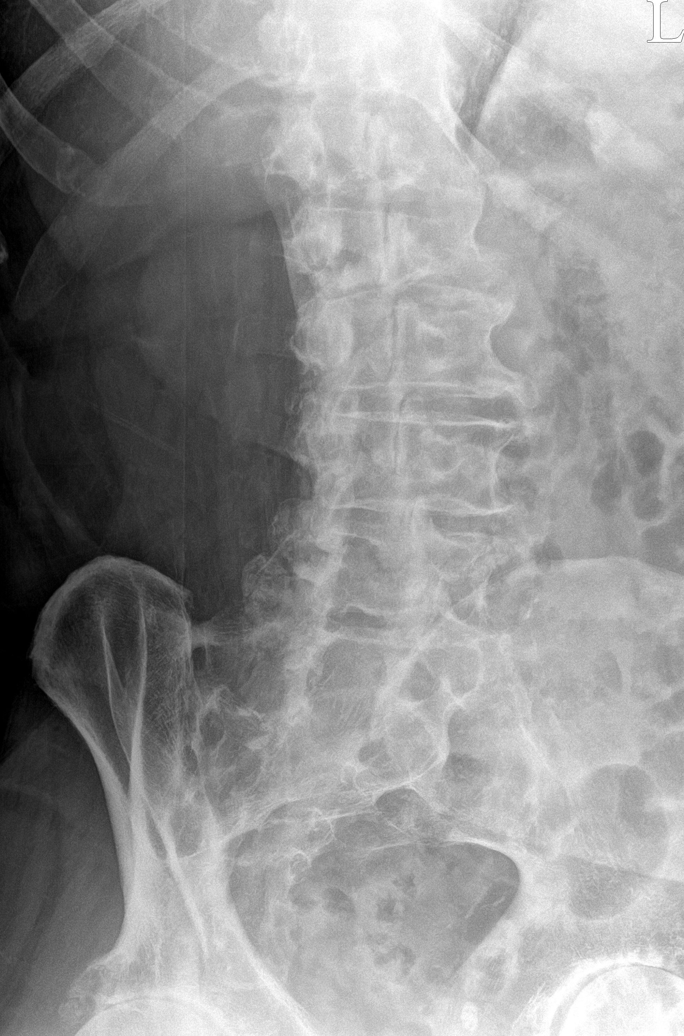

[l-spine lateral]
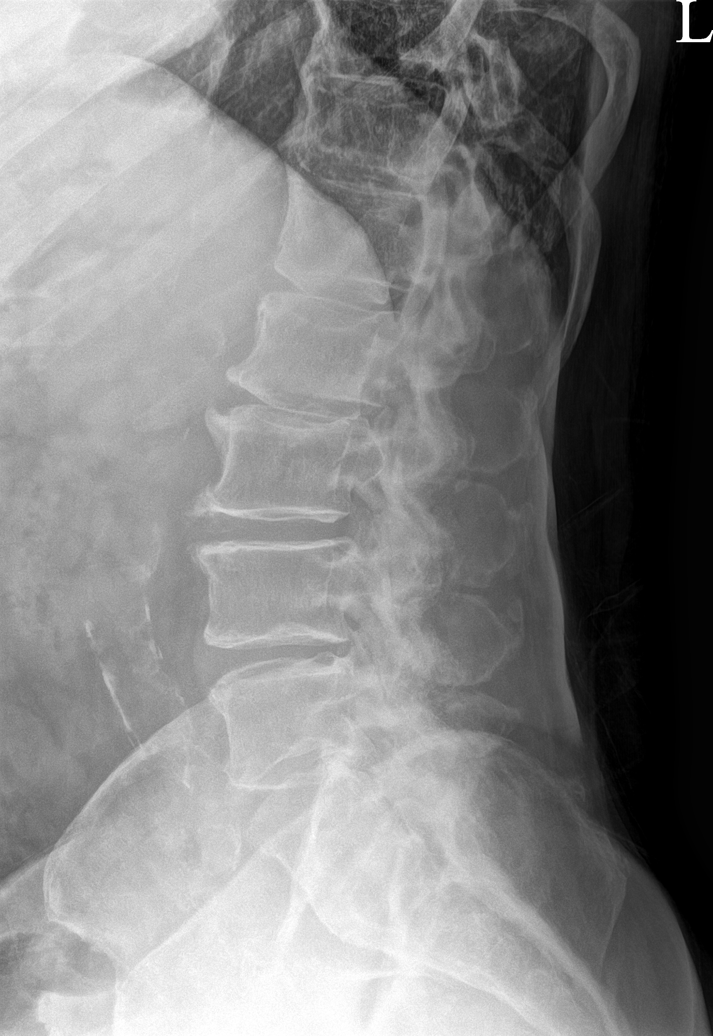

[l-spine spot]
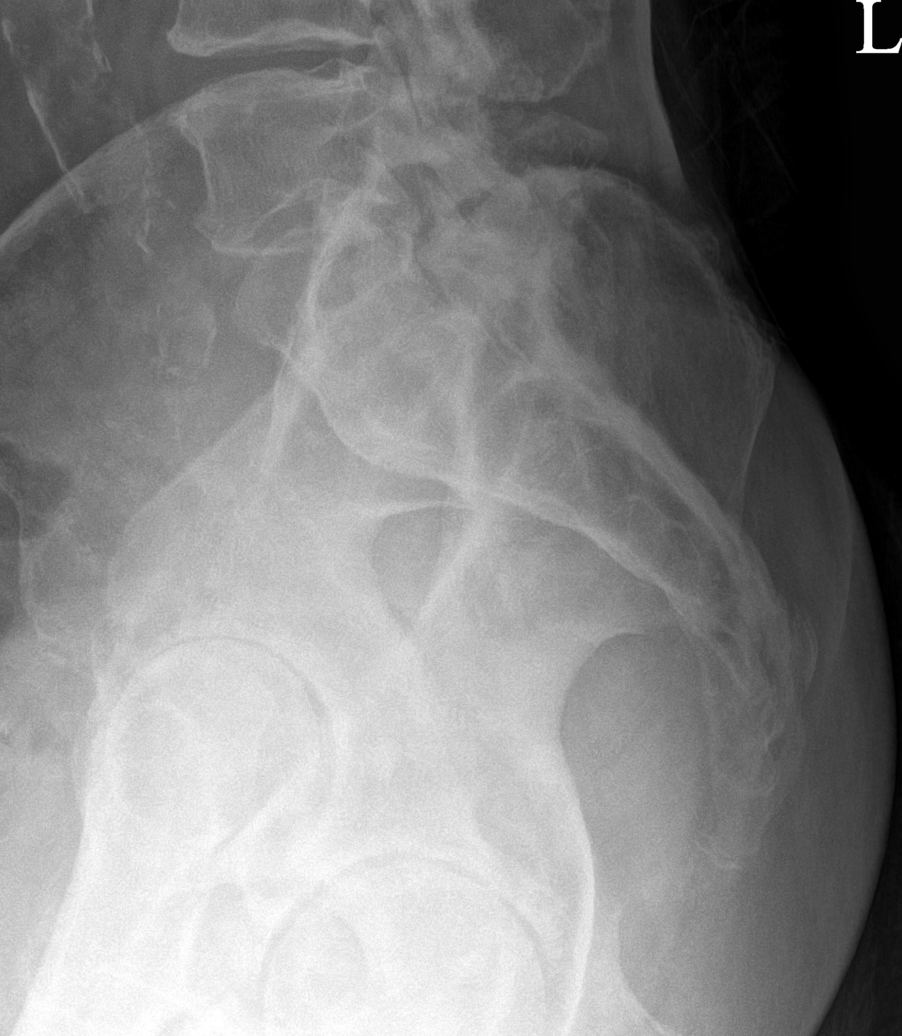

[5 of 5 positions shown; findings below may reference images not displayed]

FINDINGS: The lower most fully formed disc space will be labeled L5-S1, S[DATE]
be partially lumbarized. Vertebral bodies are numbered on the
current exam. Normal alignment. Diffuse disc space narrowing and
endplate spurring throughout the lumbar spine. Prominent multilevel
facet hypertrophy, most prominently affecting L4-L5 and L5-S1.
Normal vertebral body heights. No evidence of fracture or focal bone
lesion. The bones are diffusely under mineralized. Sacroiliac joints
are not well assessed on the current exam. Aortic atherosclerosis.
IMPRESSION: 1. Diffuse degenerative disc disease.
2. Diffuse facet hypertrophy most prominently affecting the lower
lumbar spine.
3. Degenerative changes have progressed from 0141 radiographs.

## 2023-09-27 IMAGING — DX DG ANKLE COMPLETE 3+V*R*
3 series · 3 of 3 positions shown · non-contrast
Comparison: None.

CLINICAL DATA: Right ankle pain.  Patient reports chronic pain.

EXAM:
RIGHT ANKLE - COMPLETE 3+ VIEW

[ankle ap]
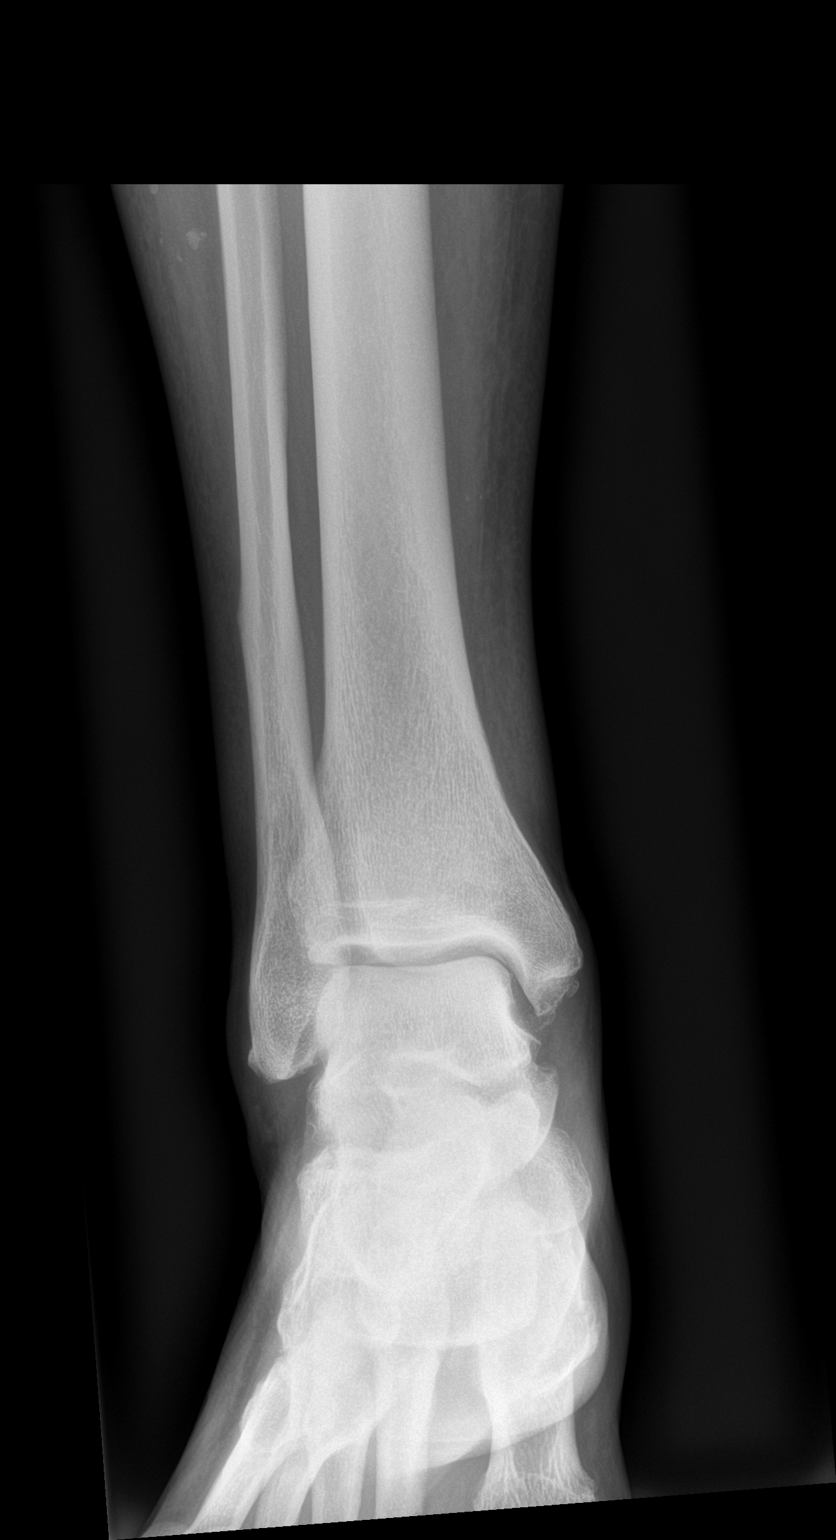

[ankle obl]
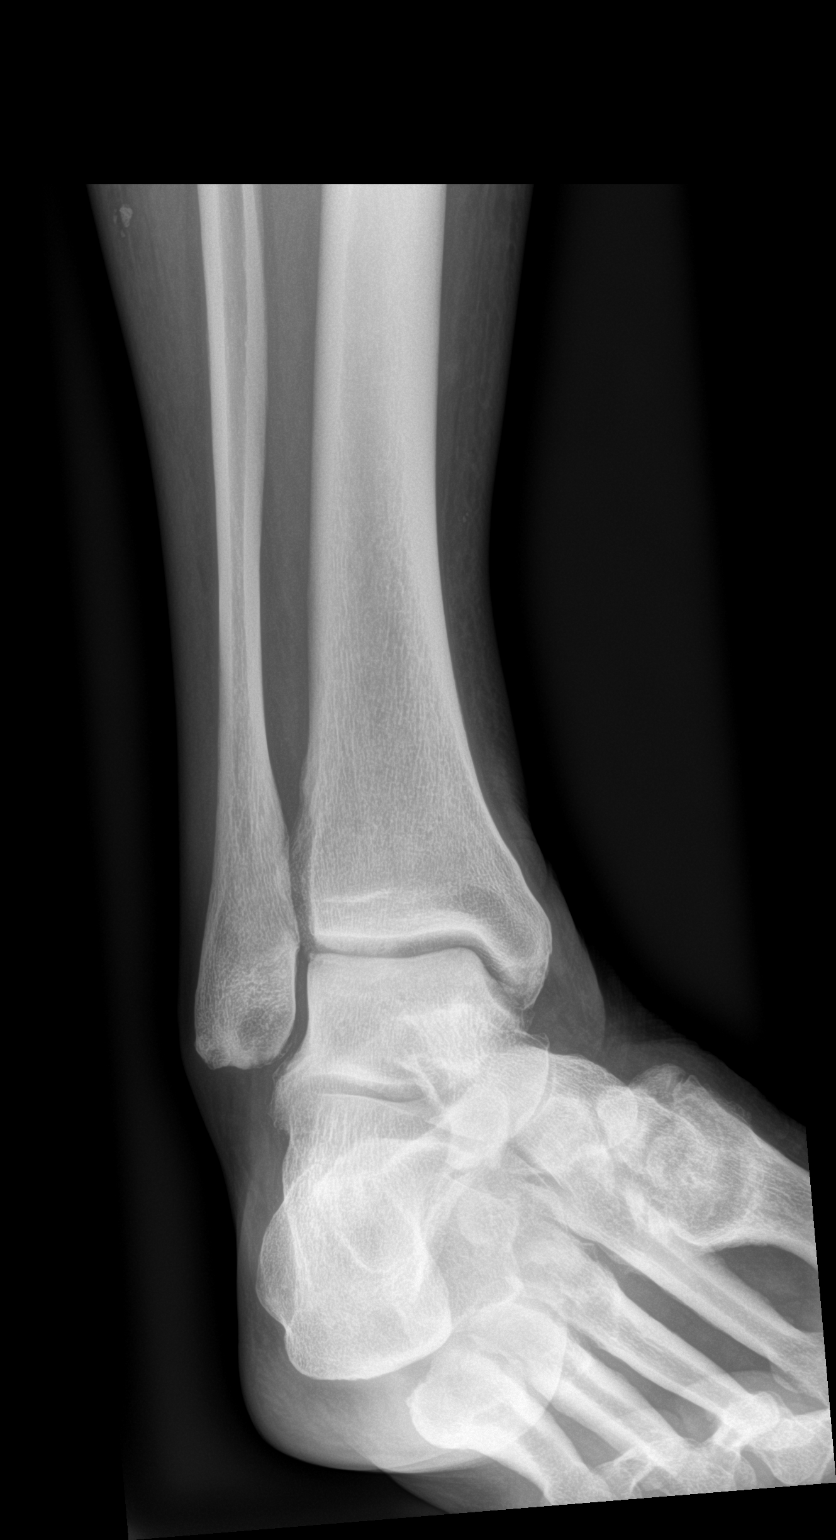

[ankle lat]
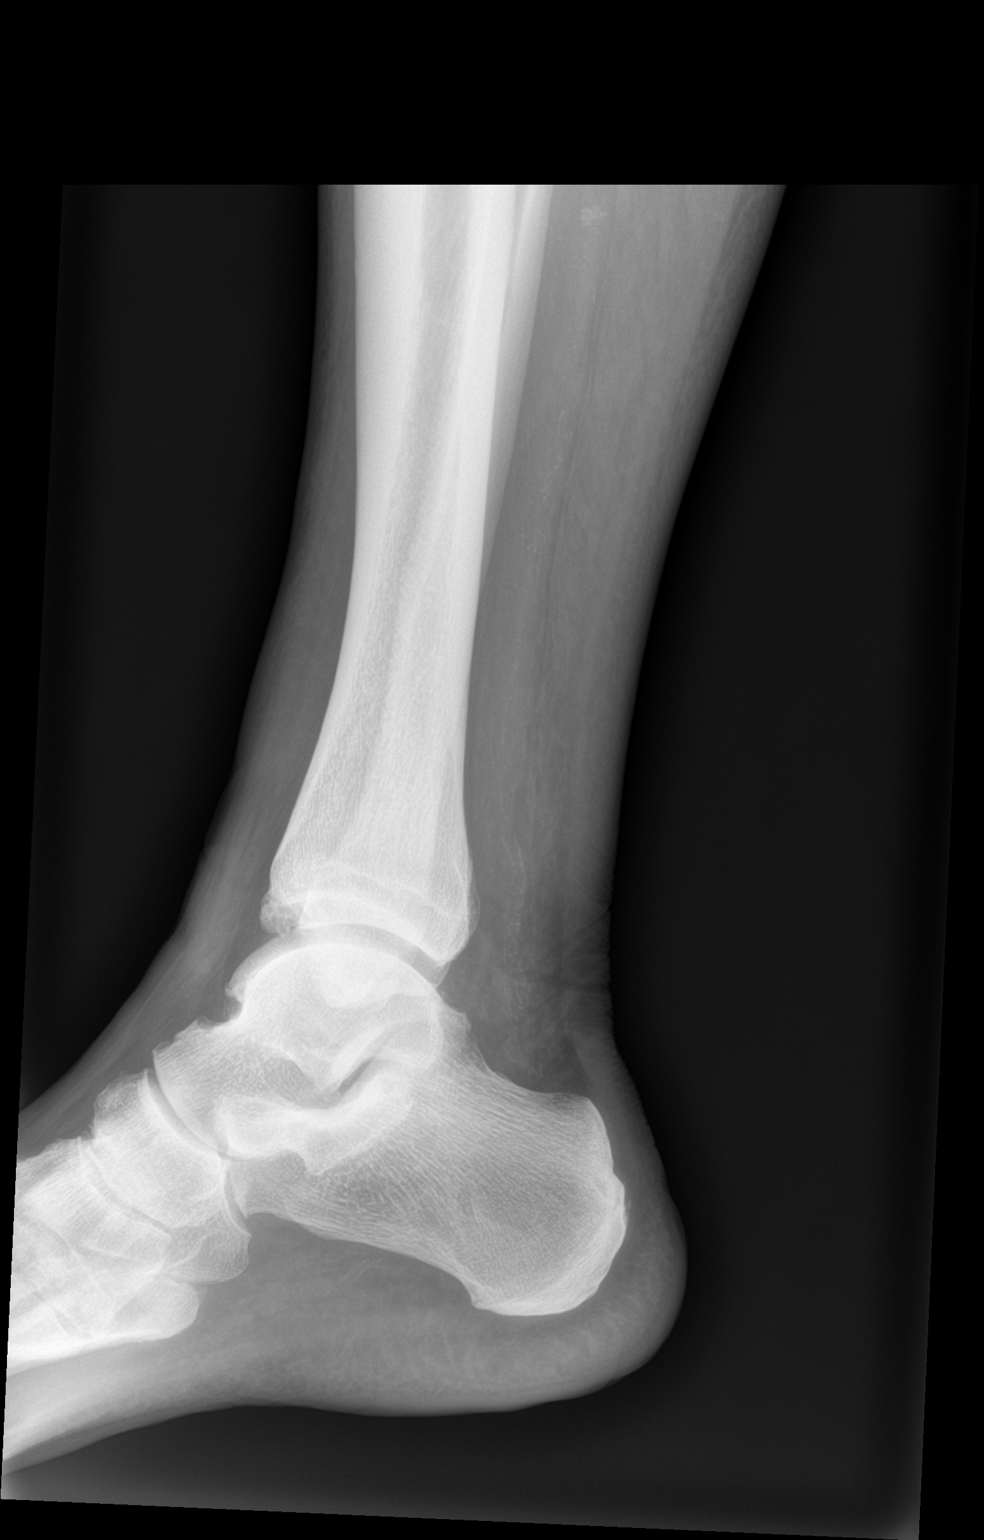

[3 of 3 positions shown; findings below may reference images not displayed]

FINDINGS: There is no evidence of fracture, dislocation, or joint effusion.
Ankle mortise is preserved. Ankle degenerative change with spurring
of the distal tibia and subchondral cysts. Mild dorsal spurring of
the talus at the capsular insertion. No erosion or periostitis. Well
corticated densities adjacent to the medial malleolus may represent
sequela of remote injury or soft tissue calcifications.
IMPRESSION: 1. Mild ankle degenerative change.
2. Soft tissue calcifications adjacent to the medial malleolus may
represent sequela of remote injury or soft tissue calcifications.

## 2023-10-12 ENCOUNTER — Ambulatory Visit (HOSPITAL_COMMUNITY)
Admission: RE | Admit: 2023-10-12 | Discharge: 2023-10-12 | Disposition: A | Discharge status: Home / Self Care | Payer: Medicare HMO | Source: Ambulatory Visit | Attending: Family Medicine | Admitting: Family Medicine

## 2023-10-12 DIAGNOSIS — I6523 Occlusion and stenosis of bilateral carotid arteries: Secondary | ICD-10-CM

## 2023-10-14 ENCOUNTER — Encounter: Payer: Self-pay | Admitting: Family Medicine

## 2023-10-15 ENCOUNTER — Other Ambulatory Visit: Payer: Self-pay

## 2023-10-15 DIAGNOSIS — E041 Nontoxic single thyroid nodule: Secondary | ICD-10-CM

## 2023-10-23 ENCOUNTER — Encounter: Payer: Self-pay | Admitting: Family Medicine

## 2023-10-23 ENCOUNTER — Other Ambulatory Visit (INDEPENDENT_AMBULATORY_CARE_PROVIDER_SITE_OTHER): Payer: Medicare HMO

## 2023-10-23 DIAGNOSIS — Z131 Encounter for screening for diabetes mellitus: Secondary | ICD-10-CM

## 2023-10-23 DIAGNOSIS — R739 Hyperglycemia, unspecified: Secondary | ICD-10-CM | POA: Diagnosis not present

## 2023-10-23 DIAGNOSIS — E785 Hyperlipidemia, unspecified: Secondary | ICD-10-CM

## 2023-10-23 DIAGNOSIS — I6523 Occlusion and stenosis of bilateral carotid arteries: Secondary | ICD-10-CM

## 2023-10-23 LAB — COMPREHENSIVE METABOLIC PANEL
ALT: 14 U/L (ref 0–53)
AST: 16 U/L (ref 0–37)
Albumin: 4 g/dL (ref 3.5–5.2)
Alkaline Phosphatase: 90 U/L (ref 39–117)
BUN: 23 mg/dL (ref 6–23)
CO2: 28 meq/L (ref 19–32)
Calcium: 9.8 mg/dL (ref 8.4–10.5)
Chloride: 106 meq/L (ref 96–112)
Creatinine, Ser: 1.37 mg/dL (ref 0.40–1.50)
GFR: 48.11 mL/min — ABNORMAL LOW (ref >60.00)
Glucose, Bld: 95 mg/dL (ref 70–99)
Potassium: 4.5 meq/L (ref 3.5–5.1)
Sodium: 140 meq/L (ref 135–145)
Total Bilirubin: 0.5 mg/dL (ref 0.2–1.2)
Total Protein: 7 g/dL (ref 6.0–8.3)

## 2023-10-23 LAB — CBC WITH DIFFERENTIAL/PLATELET
Basophils Absolute: 0.1 10*3/uL (ref 0.0–0.1)
Basophils Relative: 2.1 % (ref 0.0–3.0)
Eosinophils Absolute: 0.2 10*3/uL (ref 0.0–0.7)
Eosinophils Relative: 3.4 % (ref 0.0–5.0)
HCT: 44.7 % (ref 39.0–52.0)
Hemoglobin: 14.9 g/dL (ref 13.0–17.0)
Lymphocytes Relative: 15.6 % (ref 12.0–46.0)
Lymphs Abs: 1 10*3/uL (ref 0.7–4.0)
MCHC: 33.3 g/dL (ref 30.0–36.0)
MCV: 90.9 fl (ref 78.0–100.0)
Monocytes Absolute: 0.7 10*3/uL (ref 0.1–1.0)
Monocytes Relative: 11.2 % (ref 3.0–12.0)
Neutro Abs: 4.5 10*3/uL (ref 1.4–7.7)
Neutrophils Relative %: 67.7 % (ref 43.0–77.0)
Platelets: 219 10*3/uL (ref 150.0–400.0)
RBC: 4.92 Mil/uL (ref 4.22–5.81)
RDW: 13.9 % (ref 11.5–15.5)
WBC: 6.6 10*3/uL (ref 4.0–10.5)

## 2023-10-23 LAB — TSH: TSH: 0.97 u[IU]/mL (ref 0.35–5.50)

## 2023-10-23 LAB — LIPID PANEL
Cholesterol: 175 mg/dL (ref 0–200)
HDL: 37.8 mg/dL — ABNORMAL LOW (ref >39.00)
LDL Cholesterol: 108 mg/dL — ABNORMAL HIGH (ref 0–99)
NonHDL: 137.34
Total CHOL/HDL Ratio: 5
Triglycerides: 147 mg/dL (ref 0.0–149.0)
VLDL: 29.4 mg/dL (ref 0.0–40.0)

## 2023-10-23 LAB — HEMOGLOBIN A1C: Hgb A1c MFr Bld: 6.5 % (ref 4.6–6.5)

## 2023-10-27 ENCOUNTER — Ambulatory Visit

## 2023-10-27 ENCOUNTER — Encounter: Payer: Self-pay | Admitting: Family Medicine

## 2023-10-27 ENCOUNTER — Ambulatory Visit (INDEPENDENT_AMBULATORY_CARE_PROVIDER_SITE_OTHER): Payer: Medicare HMO | Admitting: Family Medicine

## 2023-10-27 VITALS — BP 136/75 | HR 58 | Temp 97.9°F | Ht 75.0 in | Wt 212.6 lb

## 2023-10-27 DIAGNOSIS — R053 Chronic cough: Secondary | ICD-10-CM

## 2023-10-27 DIAGNOSIS — Z0000 Encounter for general adult medical examination without abnormal findings: Secondary | ICD-10-CM | POA: Diagnosis not present

## 2023-10-27 MED ORDER — SIMVASTATIN 10 MG PO TABS: 10.0000 mg | ORAL_TABLET | Freq: Every day | ORAL | 3 refills | Status: AC

## 2023-10-27 NOTE — Patient Instructions (Addendum)
  Diabetes education (also good for prediabetes) -Get on youtube and access videos by Dr. Rayfield Citizen (an endocrinologist. Listen to diabetes education parts 1-5)   Try simvastatin 10 mg to see if that helps lower cholesterol   Chest x-ray before you go with cough for a few months and some crackles on lung exam -results should be back within 2 weeks- sorry for delay  Recommended follow up: Return in about 6 months (around 04/28/2024) for followup or sooner if needed.Schedule b4 you leave.

## 2023-10-27 NOTE — Progress Notes (Signed)
 Phone: [phone]   Subjective:  Patient presents today for their annual physical. Chief complaint-noted.   See problem oriented charting- ROS- full  review of systems was completed and negative  except WGN:FAOZHY dry cough  The following were reviewed and entered/updated in epic: Past Medical History:  Diagnosis Date   Adenomatous colon polyp    Arthritis 09/10/2011   rt. hip, knee, hands   Cancer (HCC) 09/10/2011   dx. Prostate cancer-bx. 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   Peripheral neuropathy    Permanent atrial fibrillation (HCC) 09/10/2011   Pre-diabetes    Prostate cancer (HCC)    Stroke [facility])    Thyroid disease    Patient Active Problem List   Diagnosis Date Noted   History of CVA (cerebrovascular accident) 05/14/2016    Priority: High   History of melanoma 12/04/2015    Priority: High   Atrial fibrillation (HCC) 04/06/2007    Priority: High   Hyperglycemia 08/13/2016    Priority: Medium    Thyroid nodule     Priority: Medium    GERD (gastroesophageal reflux disease) 12/04/2015    Priority: Medium    CKD (chronic kidney disease), stage III (HCC) 12/07/2014    Priority: Medium    History of prostate cancer 09/23/2012    Priority: Medium    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    Priority: Medium    Essential hypertension 04/07/2007    Priority: Medium    Hyperlipidemia 04/06/2007    Priority: Medium    Double vision 12/14/2017    Priority: Low   Gross hematuria 02/12/2017    Priority: Low   Osteoarthritis of left hip 08/09/2014    Priority: Low   Actinic keratosis 06/05/2014    Priority: Low   Hx of adenomatous colonic polyps 06/05/2011     Priority: Low   Osteoarthritis of right hip 05/18/2023   Urinary and fecal incontinence 10/14/2021   Spinal stenosis of lumbar region 09/23/2021   Past Surgical History:  Procedure Laterality Date   COLONOSCOPY  04/2003, 06/05/2011   diverticulosis, internal and external hemorrhoids 2004 and 2012, 2 small polyps 2012   EYE SURGERY  09/10/2011   Only stitches to close cut, and observation of hematoma from injury, cataracts bilateral   HERNIA REPAIR  2019   INGUINAL HERNIA REPAIR Right 06/16/2019   Procedure: OPEN RIGHT INGUINAL HERNIA REPAIR;  Surgeon: Ovidio Kin, MD;  Location: [facility] OR;  Service: General;  Laterality: Right;  OPEN RIGHT INGUINAL HERNIA REPAIR   JOINT REPLACEMENT  10-24   LUMBAR LAMINECTOMY/DECOMPRESSION MICRODISCECTOMY N/A 09/23/2021   Procedure: Laminectomy and Foraminotomy - Lumbar Two- Lumbar Three - Lumbar Three- Lumbar Four - Lumbar Four- Lumbar Five;  Surgeon: Donalee Citrin, MD;  Location: [facility] OR;  Service: Neurosurgery;  Laterality: N/A;  Laminectomy and Foraminotomy - Lumbar Two- Lumbar Three - Lumbar Three- Lumbar Four - Lumbar Four- Lumbar Five   melanoma surgery      ROBOT ASSISTED LAPAROSCOPIC RADICAL PROSTATECTOMY  09/15/2011   Procedure: ROBOTIC ASSISTED LAPAROSCOPIC RADICAL PROSTATECTOMY LEVEL 2;  Surgeon: Crecencio Mc, MD;  Location: [facility] ORS;  Service: Urology;  Laterality: N/A;  SPINE SURGERY  2-23   TONSILLECTOMY     TOTAL HIP ARTHROPLASTY Right 05/18/2023   Procedure: TOTAL HIP ARTHROPLASTY ANTERIOR APPROACH;  Surgeon: Ollen Gross, MD;  Location: [facility] ORS;  Service: Orthopedics;  Laterality: Right;    Family History  Problem Relation Age of Onset   Diabetes Mother    Hypertension Mother    Diverticulosis Mother    Heart attack Father 9       MI. died at 71   Prostate cancer Brother    Alzheimer's disease Paternal Grandmother    Heart attack Paternal Grandfather    Colon cancer Neg Hx     Medications- reviewed and updated Current  Outpatient Medications  Medication Sig Dispense Refill   acetaminophen (TYLENOL) 650 MG CR tablet Take 650 mg by mouth 2 (two) times daily.     famotidine (PEPCID) 20 MG tablet TAKE 1 TABLET(20 MG) BY MOUTH TWICE DAILY 180 tablet 3   metoprolol succinate (TOPROL-XL) 25 MG 24 hr tablet TAKE 1 TABLET BY MOUTH DAILY WITH OR IMMEDIATELY FOLLOWING A MEAL 90 tablet 3   Multiple Vitamin (MULTIVITAMIN WITH MINERALS) TABS tablet Take 1 tablet by mouth daily.     simvastatin (ZOCOR) 10 MG tablet Take 1 tablet (10 mg total) by mouth at bedtime. 90 tablet 3   VITAMIN D PO Take 1 tablet by mouth daily.     XARELTO 20 MG TABS tablet TAKE 1 TABLET(20 MG) BY MOUTH DAILY 90 tablet 1   No current facility-administered medications for this visit.    Allergies-reviewed and updated Allergies  Allergen Reactions   Atorvastatin     Mental fogginess. Perceived leg weakness    Ciprofloxacin Nausea Only   Flagyl [Metronidazole] Nausea Only    Social History   Social History Narrative   Married with 3 kids. 6 grandkids.       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       Hobbies: beach (place at [employer]) usually every 2 weeks.    Objective  Objective:  BP 136/75   Pulse (!) 58   Temp 97.9 F (36.6 C) (Temporal)   Ht 6\' 3"  (1.905 m)   Wt 212 lb 9.6 oz (96.4 kg)   SpO2 100%   BMI 26.57 kg/m  Gen: NAD, resting comfortably HEENT: Mucous membranes are moist. Oropharynx normal Neck: no thyromegaly CV: RRR no murmurs rubs or gallops Lungs: CTAB  other than crackles in bilaterally lung bases  Abdomen: soft/nontender/nondistended/normal bowel sounds. No rebound or guarding.  Ext: 1+ edema left > right Skin: warm, dry Neuro: grossly normal, moves all extremities, PERRLA    Assessment and Plan  83 y.o. male presenting for annual physical.  Health Maintenance counseling: 1. Anticipatory guidance: Patient counseled regarding regular dental exams -q6 months, eye exams - q2  yearly,  avoiding smoking and second hand smoke , limiting alcohol to 2 beverages per day - doesn't drink, no illicit drugs .   2. Risk factor reduction:  Advised patient of need for regular exercise and diet rich and fruits and vegetables to reduce risk of heart attack and stroke.  Exercise- daily to [employer] [facility] other than unday.  Diet/weight management-feels eating reasonably healthy other than doesn't stay hydrated. Mild weight loss would be advised with prediabetes as well  Wt Readings from Last 3 Encounters:  10/27/23 212 lb 9.6 oz (96.4 kg)  09/11/23 212 lb 9.6 oz (96.4 kg)  09/08/23 212 lb (96.2 kg)  . Immunizations/screenings/ancillary studies- holding off  on flu shot and shingrix  Immunization History  Administered Date(s) Administered   Fluad Quad(high Dose 65+) 04/27/2019, 08/14/2020   Influenza Split 05/04/2012   Influenza, High Dose Seasonal PF 08/26/2017, 06/04/2018   PFIZER(Purple Top)SARS-COV-2 Vaccination 10/03/2019, 11/01/2019, 08/14/2020   Pneumococcal Conjugate-13 02/12/2017   Pneumococcal Polysaccharide-23 07/15/2007   Td 04/05/2003, 12/11/2017   Tetanus 06/05/2014    4. Prostate cancer screening- % History of prostate cancer-now released- previously followed with Dr. Laverle Patter after robotic prostatectomy-recheck PSAs  yearly - didn't get added to this years labs- try to remember for next visit Lab Results  Component Value Date   PSA <0.04 05/02/2022   PSA <0.015 08/18/2018   PSA <0.015 08/13/2016   5. Colon cancer screening -  Dr. Leone Payor in letter on April 22, 2019 recommended no further colonoscopies  6. Skin cancer screening- history of melanoma- follows up every 3-6 months with dermatology - has In a week or so advised regular sunscreen use. Denies worrisome, changing, or new skin lesions.  7. Smoking associated screening (lung cancer screening, AAA screen 65-75, UA)- former smoker- quit in 1970s- no regular screening 8. STD screening - only active with wife- only  within marriage- continue to monitor   Status of chronic or acute concerns   # Presyncopal episodes x 2 prior to last visit in early February-carotid ultrasound largely reassuring.  Cardiac monitor through cardiology reassuring- no obvious cause- he never pressed button.  Thought less likely to be TIA or stroke and he had declined MRI. - workup reassuring and no recurrence- continue to monitor for now   # Slight dry cough at last visit persistent- still has in morning. Still on famotidine for reflux . Offered x-ray but he wants to hold off for now    #Atrial fibrillation- sees Dr. Nelly Laurence S: Patient compliant with metoprolol 25 mg extended release for rate control.  Also uses Xarelto 20 mg for anticoagulation. A/P: appropriately anticoagulated and rate controlled- continue current medicine   -Have to monitor GFR and if consistently below 50 may reduce Xarelto to 15 mg but has fluctuated   #Hyperlipidemia/history of stroke with LDL goal under 70 though likely was embolic S: Compliant with  rosuvastatin 10 mg in 2024 but came off -atorvastatin 20 mg leg weakness and mental fog -simvastatin lipids above goal Lab Results  Component Value Date   CHOL 175 10/23/2023   HDL 37.80 (L) 10/23/2023   LDLCALC 108 (H) 10/23/2023   LDLDIRECT 65.0 04/16/2023   TRIG 147.0 10/23/2023   CHOLHDL 5 10/23/2023  A/P: lipids above ideal goal- didn't tolerate statins-  offered zetia but he tolerated zimvastatin in past and wants to try low dose- recheck LDL next visit    #Hypertension S: Compliant with metoprolol though primarily for rate control at 25 mg XR BP Readings from Last 3 Encounters:  10/27/23 136/75  09/11/23 134/74  08/19/23 (!) 196/88  A/P: reasonable control- continue current medications     #CKD stage III S: GFR has been in the 50's- most recent check 48.  Patient knows to avoid NSAIDs-also particularly with anticoagulation.  Would prefer H2 blockers over PPI A/P: overall stable- continue  to monitor    # Hyperglycemia/insulin resistance/prediabetes- a1c up to 6.5 x1  S:  Medication: none Lab Results  Component Value Date   HGBA1C 6.5 10/23/2023   HGBA1C 6.2 04/16/2023   HGBA1C 6.2 11/18/2022   A/P: very end of diabetes range- I want him to work on healthy eating and regular exercise -  already doing a good job on exercise but may be able to improve diet and work on staying hydrated  # idiopathic neuropathy- does not want to use medication - tolerating. Balance is biggest issue  # GERD- famotidine helpful. Better choice with CKD Stage III than PPI see above about cough  %Thyroid nodule- followed previously with Dr. Lucianne Muss. We annually Update TSH with labs due to prior thyroid nodule though had benign biopsy in 2018 and 2022.   -We are updating ultrasound 10/28/2023 tomorrow   Recommended follow up: Return in about 6 months (around 04/28/2024) for followup or sooner if needed.Schedule b4 you leave. Future Appointments  Date Time Provider Department Center  10/28/2023 10:15 AM [facility] [place] [facility]  12/07/2023  9:15 AM Sheilah Pigeon, PA-C [id number]    Lab/Order associations:already had  fasting labs    ICD-10-CM   1. Preventative health care  Z00.00     2. Chronic cough  R05.3 DG Chest 2 View      Meds ordered this encounter  Medications   simvastatin (ZOCOR) 10 MG tablet    Sig: Take 1 tablet (10 mg total) by mouth at bedtime.    Dispense:  90 tablet    Refill:  3    Return precautions advised.  Tana Conch, MD

## 2023-10-28 ENCOUNTER — Ambulatory Visit
Admission: RE | Admit: 2023-10-28 | Discharge: 2023-10-28 | Disposition: A | Discharge status: Home / Self Care | Source: Ambulatory Visit | Attending: Family Medicine | Admitting: Family Medicine

## 2023-10-28 ENCOUNTER — Encounter: Payer: Self-pay | Admitting: Family Medicine

## 2023-10-28 DIAGNOSIS — E041 Nontoxic single thyroid nodule: Secondary | ICD-10-CM

## 2023-11-06 ENCOUNTER — Encounter: Payer: Self-pay | Admitting: Family Medicine

## 2023-12-06 NOTE — Progress Notes (Unsigned)
 Cardiology Office Note Date:  12/06/2023  Patient ID:  Elijah [name], DOB 22-Jun-1941, MRN [id number] PCP:  Almira Jaeger, MD  Cardiologist:  Dr. Nunzio Belch >> Dr. Arlester Ladd   Chief Complaint:  *** planned f/u  History of Present Illness: Elijah [name] is a 83 y.o. male with history of permanent AFib, HTN, HLD, CRI stage III, peripheral neuropathy, prostate cancer s/p surgery with subsequent problems with fecal incontinence at times Stroke RBBB  Seeing Dr. Nunzio Belch EP team over the years AFib described as permanent  I saw him Sept 2021 He continues to do well. Denies any cardiac awareness, no CP or palpitations [place] [facility] [place] [facility] [place] 2 [place] and feels like he has good exertional capacity. No SOB or DOE No difficulties with his ADLs Sees his PMD Q 32mo No bleeding or signs of bleeding  he has no concerns today Rate controlled afib, no changes were made  Had a hiatus from the office after this until seeing Dr. Arlester Ladd  06/03/22, feeling well, rate controlled, maintained on OAC.  Had hip surgery Oct 2024  ER visit 08/19/23 for a couple episodes of dizziness, associated with diaphoresis Both seated, once frustrated on the phone, the other while working on a puzzle Orthostatics negative Labs noted K+ of 5.5 on repeat was 5, HS Trop 13 EKG AFib rate controlled with some PVC CT head without acute findings but does show his prior chronic strokes  Suspect dehydrated  I saw him 09/08/23 He has not had recurrent symptoms Reports the day prior to going to the ER was seated working in the computer when he started to feel weak, got very lightheaded, then seemed to settle away The next day seated doing crossword puzzle with the same symptoms. No syncope His wife reported that he was cold/sweaty No trigger that he notes, no missed or accidental extra doses of meds No illness, symptoms of illness No CP, SOB  No bleeding or signs of bleeding He goes to the gym daily, walks and does  machine Reports excellent exertional capacity -- orthostatic vitals flat, planned for monitoring,, also advised carotid US  > he was ted to defer this to his [facility]  Carotids were OK Monitor 100% AF HR 39-169 bpm, AVG 61 5.3% PVCs including two brief runs of NSVT, the longer was 4.6s One patient-triggered episode correlated with PVCs  Slowest rate occurred @ 01:31   TODAY *** symptoms *** xarelto , dose, bleeding, labs   Past Medical History:  Diagnosis Date   Adenomatous colon polyp    Arthritis 09/10/2011   rt. hip, knee, hands   Cancer (HCC) 09/10/2011   dx. Prostate cancer-bx. 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   Peripheral neuropathy    Permanent atrial fibrillation (HCC) 09/10/2011   Pre-diabetes    Prostate cancer (HCC)    Stroke [facility])    Thyroid  disease     Past Surgical History:  Procedure Laterality Date   COLONOSCOPY  04/2003, 06/05/2011   diverticulosis, internal and external hemorrhoids 2004 and 2012, 2 small polyps 2012   EYE SURGERY  09/10/2011   Only stitches to close cut, and observation of hematoma  from injury, cataracts bilateral   HERNIA REPAIR  2019   INGUINAL HERNIA REPAIR Right 06/16/2019   Procedure: OPEN RIGHT INGUINAL HERNIA REPAIR;  Surgeon: Juanita Norlander, MD;  Location: [facility] OR;  Service: General;  Laterality: Right;  OPEN RIGHT INGUINAL HERNIA REPAIR   JOINT REPLACEMENT  10-24   LUMBAR LAMINECTOMY/DECOMPRESSION MICRODISCECTOMY N/A 09/23/2021   Procedure: Laminectomy and Foraminotomy - Lumbar Two- Lumbar Three - Lumbar Three- Lumbar Four - Lumbar Four- Lumbar Five;  Surgeon: Gearl Keens, MD;  [facility] OR;  Service: Neurosurgery;  Laterality: N/A;  Laminectomy  and Foraminotomy - Lumbar Two- Lumbar Three - Lumbar Three- Lumbar Four - Lumbar Four- Lumbar Five   melanoma surgery      ROBOT ASSISTED LAPAROSCOPIC RADICAL PROSTATECTOMY  09/15/2011   Procedure: ROBOTIC ASSISTED LAPAROSCOPIC RADICAL PROSTATECTOMY LEVEL 2;  Surgeon: Kristeen Peto, MD;  Location: [facility] ORS;  Service: Urology;  Laterality: N/A;         SPINE SURGERY  2-23   TONSILLECTOMY     TOTAL HIP ARTHROPLASTY Right 05/18/2023   Procedure: TOTAL HIP ARTHROPLASTY ANTERIOR APPROACH;  Surgeon: Liliane Rei, MD;  Location: [facility] ORS;  Service: Orthopedics;  Laterality: Right;    Current Outpatient Medications  Medication Sig Dispense Refill   acetaminophen  (TYLENOL ) 650 MG CR tablet Take 650 mg by mouth 2 (two) times daily.     famotidine  (PEPCID ) 20 MG tablet TAKE 1 TABLET(20 MG) BY MOUTH TWICE DAILY 180 tablet 3   metoprolol  succinate (TOPROL -XL) 25 MG 24 hr tablet TAKE 1 TABLET BY MOUTH DAILY WITH OR IMMEDIATELY FOLLOWING A MEAL 90 tablet 3   Multiple Vitamin (MULTIVITAMIN WITH MINERALS) TABS tablet Take 1 tablet by mouth daily.     simvastatin  (ZOCOR ) 10 MG tablet Take 1 tablet (10 mg total) by mouth at bedtime. 90 tablet 3   VITAMIN D  PO Take 1 tablet by mouth daily.     XARELTO  20 MG TABS tablet TAKE 1 TABLET(20 MG) BY MOUTH DAILY 90 tablet 1   No current facility-administered medications for this visit.    Allergies:   Atorvastatin , Ciprofloxacin , and Flagyl  [metronidazole ]   Social History:  The patient  reports that he quit smoking about 55 years ago. His smoking use included cigarettes. He has never used smokeless tobacco. He reports that he does not drink alcohol and does not use drugs.   Family History:  The patient's family history includes Alzheimer's disease in his paternal grandmother; Diabetes in his mother; Diverticulosis in his mother; Heart attack in his paternal grandfather; Heart attack (age of onset: 33) in his father; Hypertension in his mother; Prostate cancer in his  brother.  ROS:  Please see the history of present illness.  All other systems are reviewed and otherwise negative.   PHYSICAL EXAM:  VS:  There were no vitals taken for this visit. BMI: There is no height or weight on file to calculate BMI. Well nourished, well developed, in no acute distress  HEENT: normocephalic, atraumatic  Neck: no JVD, carotid bruits or masses Cardiac: *** irreg-irreg, no significant murmurs, no rubs, or gallops Lungs: *** CTA b/l, no wheezing, rhonchi or rales  Abd: soft, nontender MS: no deformity or atrophy Ext: *** no edema  Skin: warm and dry, no rash Neuro:  No gross deficits appreciated Psych: euthymic mood, full affect   EKG:  in the ER, 1/24 and 25/25 and reviewed by myself  ***  AFib 69bpm, RBBB, PVC AFib 74bpm, RBBB, PVCs   05/15/16: TTE Study Conclusions -  Left ventricle: The cavity size was normal. Wall thickness was   increased in a pattern of mild LVH. Systolic function was normal.   The estimated ejection fraction was in the range of 55% to 60%.   Wall motion was normal; there were no regional wall motion   abnormalities. - Mitral valve: Calcified annulus. There was mild regurgitation. - Left atrium: The atrium was severely dilated. - Right ventricle: The cavity size was mildly dilated. - Right atrium: The atrium was severely dilated. Impressions: - Normal LV systolic function; mild LVH; severe biatrial  (LA 49mm)   enlargement; mild RVE; mild MR; trace TR.  Recent Labs: 10/23/2023: ALT 14; BUN 23; Creatinine, Ser 1.37; Hemoglobin 14.9; Platelets 219.0; Potassium 4.5; Sodium 140; TSH 0.97  04/16/2023: Direct LDL 65.0 10/23/2023: Cholesterol 175; HDL 37.80; LDL Cholesterol 108; Total CHOL/HDL Ratio 5; Triglycerides 147.0; VLDL 29.4   CrCl cannot be calculated (Patient's most recent lab result is older than the maximum 21 days allowed.).   Wt Readings from Last 3 Encounters:  10/27/23 212 lb 9.6 oz (96.4 kg)  09/11/23 212 lb 9.6 oz  (96.4 kg)  09/08/23 212 lb (96.2 kg)     Other studies reviewed: Additional studies/records reviewed today include: summarized above  ASSESSMENT AND PLAN:  1. Long hx of permanent AFib     *** rate controlled and asymptomatic     CHA2DS2Vasc is 4,  on Xarelto , *** appropriately dosed        2. HTN     *** Looks good, no changes  3. Near syncope ***  4. Secondary hypercoagulable state    Disposition:  back in *** sooner if needed     Current medicines are reviewed at length with the patient today.  The patient did not have any concerns regarding medicines.  Tito Formica, PA-C 12/06/2023 9:23 AM     [facility] [address] [place] [address] [phone] (office)  [phone] (fax)

## 2023-12-07 ENCOUNTER — Ambulatory Visit: Payer: Medicare HMO | Attending: Physician Assistant | Admitting: Physician Assistant

## 2023-12-07 ENCOUNTER — Encounter: Payer: Self-pay | Admitting: Physician Assistant

## 2023-12-07 VITALS — BP 122/68 | HR 60 | Ht 75.0 in | Wt 214.4 lb

## 2023-12-07 DIAGNOSIS — I493 Ventricular premature depolarization: Secondary | ICD-10-CM | POA: Diagnosis not present

## 2023-12-07 DIAGNOSIS — I10 Essential (primary) hypertension: Secondary | ICD-10-CM | POA: Diagnosis not present

## 2023-12-07 DIAGNOSIS — I4821 Permanent atrial fibrillation: Secondary | ICD-10-CM | POA: Diagnosis not present

## 2023-12-07 DIAGNOSIS — D6869 Other thrombophilia: Secondary | ICD-10-CM

## 2023-12-07 NOTE — Patient Instructions (Signed)
 Medication Instructions:   Your physician recommends that you continue on your current medications as directed. Please refer to the Current Medication list given to you today.  *If you need a refill on your cardiac medications before your next appointment, please call your pharmacy*  Lab Work: NONE ORDERED  TODAY    If you have labs (blood work) drawn today and your tests are completely normal, you will receive your results only by: MyChart Message (if you have MyChart) OR A paper copy in the mail If you have any lab test that is abnormal or we need to change your treatment, we will call you to review the results.  Testing/Procedures: NONE ORDERED  TODAY    Follow-Up: At [facility], you and your health needs are our priority.  As part of our continuing mission to provide you with exceptional heart care, our providers are all part of one team.  This team includes your primary Cardiologist (physician) and Advanced Practice Providers or APPs (Physician Assistants and Nurse Practitioners) who all work together to provide you with the care you need, when you need it.  Your next appointment:   4 month(s) ( CONTACT  CASSIE HALL/ ANGELINE HAMMER FOR EP SCHEDULING ISSUES )   Provider:   Marlane Silver, MD or Mertha Abrahams, PA-C    We recommend signing up for the patient portal called "MyChart".  Sign up information is provided on this After Visit Summary.  MyChart is used to connect with patients for Virtual Visits (Telemedicine).  Patients are able to view lab/test results, encounter notes, upcoming appointments, etc.  Non-urgent messages can be sent to your provider as well.   To learn more about what you can do with MyChart, go to [email or web address].   Other Instructions

## 2024-03-01 ENCOUNTER — Encounter: Payer: Self-pay | Admitting: Family Medicine

## 2024-03-18 ENCOUNTER — Other Ambulatory Visit: Payer: Self-pay | Admitting: Family Medicine

## 2024-04-12 ENCOUNTER — Ambulatory Visit (INDEPENDENT_AMBULATORY_CARE_PROVIDER_SITE_OTHER)

## 2024-04-12 VITALS — BP 136/78 | HR 58 | Temp 97.1°F | Ht 74.0 in | Wt 214.4 lb

## 2024-04-12 DIAGNOSIS — Z0000 Encounter for general adult medical examination without abnormal findings: Secondary | ICD-10-CM

## 2024-04-12 NOTE — Progress Notes (Signed)
 Subjective:   Elijah [name] is a 83 y.o. who presents for a Medicare Wellness preventive visit.  As a reminder, Annual Wellness Visits don't include a physical exam, and some assessments may be limited, especially if this visit is performed virtually. We may recommend an in-person follow-up visit with your provider if needed.  Visit Complete: In person    Persons Participating in Visit: Patient.  AWV Questionnaire: No: Patient Medicare AWV questionnaire was not completed prior to this visit.  Cardiac Risk Factors include: advanced age (>75men, >32 women);dyslipidemia;hypertension;male gender     Objective:    Today's Vitals   04/12/24 1342 04/12/24 1343  BP: 136/78   Pulse: (!) 58   Temp: (!) 97.1 F (36.2 C)   SpO2: 98%   Weight: 214 lb 6.4 oz (97.3 kg)   Height: 6' 2 (1.88 m)   PainSc:  3    Body mass index is 27.53 kg/m.     04/12/2024    1:47 PM 05/18/2023    6:25 PM 05/07/2023   11:14 AM 11/15/2022    3:12 PM 09/19/2021   10:19 AM 03/16/2018    4:17 PM 05/14/2016    9:00 PM  Advanced Directives  Does Patient Have a Medical Advance Directive? Yes Yes Yes No Yes Yes  Yes   Type of Estate agent of [place];Living will Healthcare Power of [place];Living will Healthcare Power of [place];Living will  Healthcare Power of [place];Living will Healthcare Power of [place];Living will Living will;Healthcare Power of Attorney   Does patient want to make changes to medical advance directive?  No - Patient declined   No - Patient declined No - Patient declined  No - Patient declined   Copy of Healthcare Power of Attorney in Chart? No - copy requested    No - copy requested No - copy requested  No - copy requested   Would patient like information on creating a medical advance directive?    No - Patient declined        Data saved with a previous flowsheet row definition    Current Medications (verified) Outpatient Encounter Medications as of 04/12/2024   Medication Sig   acetaminophen  (TYLENOL ) 650 MG CR tablet Take 650 mg by mouth 2 (two) times daily.   famotidine  (PEPCID ) 20 MG tablet TAKE 1 TABLET(20 MG) BY MOUTH TWICE DAILY   metoprolol  succinate (TOPROL -XL) 25 MG 24 hr tablet TAKE 1 TABLET BY MOUTH DAILY WITH OR IMMEDIATELY FOLLOWING A MEAL   Multiple Vitamin (MULTIVITAMIN WITH MINERALS) TABS tablet Take 1 tablet by mouth daily.   simvastatin  (ZOCOR ) 10 MG tablet Take 1 tablet (10 mg total) by mouth at bedtime.   VITAMIN D  PO Take 1 tablet by mouth daily.   XARELTO  20 MG TABS tablet TAKE 1 TABLET(20 MG) BY MOUTH DAILY   No facility-administered encounter medications on file as of 04/12/2024.    Allergies (verified) Atorvastatin , Ciprofloxacin , and Flagyl  [metronidazole ]   History: Past Medical History:  Diagnosis Date   Adenomatous colon polyp    Arthritis 09/10/2011   rt. hip, knee, hands   Cancer (HCC) 09/10/2011   dx. Prostate cancer-bx. 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 etiology   Peripheral neuropathy    Permanent atrial fibrillation (HCC) 09/10/2011   Pre-diabetes    Prostate cancer (HCC)    Stroke [facility])    Thyroid  disease    Past Surgical History:  Procedure Laterality Date   COLONOSCOPY  04/2003, 06/05/2011   diverticulosis, internal and external hemorrhoids 2004 and 2012, 2 small polyps 2012   EYE SURGERY  09/10/2011   Only stitches to close cut, and observation of hematoma from injury, cataracts bilateral   HERNIA REPAIR  2019   INGUINAL HERNIA REPAIR Right 06/16/2019   Procedure: OPEN RIGHT INGUINAL HERNIA REPAIR;  Surgeon: Ethyl Lenis, MD;  Location: [facility] OR;  Service: General;  Laterality: Right;  OPEN RIGHT INGUINAL HERNIA REPAIR    JOINT REPLACEMENT  10-24   LUMBAR LAMINECTOMY/DECOMPRESSION MICRODISCECTOMY N/A 09/23/2021   Procedure: Laminectomy and Foraminotomy - Lumbar Two- Lumbar Three - Lumbar Three- Lumbar Four - Lumbar Four- Lumbar Five;  Surgeon: Onetha Kuba, MD;  Location: [facility] OR;  Service: Neurosurgery;  Laterality: N/A;  Laminectomy and Foraminotomy - Lumbar Two- Lumbar Three - Lumbar Three- Lumbar Four - Lumbar Four- Lumbar Five   melanoma surgery      ROBOT ASSISTED LAPAROSCOPIC RADICAL PROSTATECTOMY  09/15/2011   Procedure: ROBOTIC ASSISTED LAPAROSCOPIC RADICAL PROSTATECTOMY LEVEL 2;  Surgeon: Noretta Ferrara, MD;  Location: [facility] ORS;  Service: Urology;  Laterality: N/A;         SPINE SURGERY  2-23   TONSILLECTOMY     TOTAL HIP ARTHROPLASTY Right 05/18/2023   Procedure: TOTAL HIP ARTHROPLASTY ANTERIOR APPROACH;  Surgeon: Melodi Lerner, MD;  Location: [facility] ORS;  Service: Orthopedics;  Laterality: Right;   Family History  Problem Relation Age of Onset   Diabetes Mother    Hypertension Mother    Diverticulosis Mother    Heart attack Father 52       MI. died at 34   Prostate cancer Brother    Alzheimer's disease Paternal Grandmother    Heart attack Paternal Grandfather    Colon cancer Neg Hx    Social History   Socioeconomic History   Marital status: Married    Spouse name: Not on file   Number of children: 3   Years of education: Not on file   Highest education level: Master's degree (e.g., MA, MS, MEng, MEd, MSW, MBA)  Occupational History   Occupation: Programmer, applications: RETIRED  Tobacco Use   Smoking status: Former    Current packs/day: 0.00    Types: Cigarettes    Quit date: 08/04/1968    Years since quitting: 55.7   Smokeless tobacco: Never  Vaping Use   Vaping status: Never Used  Substance and Sexual Activity   Alcohol use: No    Comment: none in 20 yrs   Drug use: No   Sexual activity: Not Currently  Other Topics Concern   Not on file  Social History Narrative   Married with  3 kids. 6 grandkids.       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       Hobbies: beach (place at [employer]) usually every 2 weeks.    Social Drivers of Corporate investment banker Strain: Low Risk  (04/12/2024)   Overall Financial Resource Strain (CARDIA)    Difficulty of Paying Living Expenses: Not hard at all  Food Insecurity: No Food Insecurity (04/12/2024)   Hunger Vital Sign    Worried About Running Out of Food in the Last  Year: Never true    Ran Out of Food in the Last Year: Never true  Transportation Needs: No Transportation Needs (04/12/2024)   PRAPARE - Administrator, Civil Service (Medical): No    Lack of Transportation (Non-Medical): No  Physical Activity: Sufficiently Active (04/12/2024)   Exercise Vital Sign    Days of Exercise per Week: 6 days    Minutes of Exercise per Session: 60 min  Stress: No Stress Concern Present (04/12/2024)   [employer] of Occupational Health - Occupational Stress Questionnaire    Feeling of Stress: Only a little  Social Connections: Moderately Integrated (04/12/2024)   Social Connection and Isolation Panel    Frequency of Communication with Friends and Family: More than three times a week    Frequency of Social Gatherings with Friends and Family: More than three times a week    Attends Religious Services: More than 4 times per year    Active Member of [employer] or Organizations: No    Attends Engineer, structural: Never    Marital Status: Married    Tobacco Counseling Counseling given: Not Answered    Clinical Intake:  Pre-visit preparation completed: Yes  Pain : 0-10 Pain Score: 3  Pain Type: Acute pain Pain Location: Generalized (fell in garage) Pain Descriptors / Indicators: Aching, Other (Comment) (stiff joints) Pain Onset: In the past 7 days Pain Frequency: Constant     BMI - recorded: 27.53 Nutritional Status: BMI 25 -29 Overweight Diabetes: No  Lab Results  Component  Value Date   HGBA1C 6.5 10/23/2023   HGBA1C 6.2 04/16/2023   HGBA1C 6.2 11/18/2022     How often do you need to have someone help you when you read instructions, pamphlets, or other written materials from your doctor or pharmacy?: 1 - Never  Interpreter Needed?: No  Information entered by :: Ellouise Haws, LPN   Activities of Daily Living     04/12/2024    1:45 PM 05/18/2023    6:25 PM  In your present state of health, do you have any difficulty performing the following activities:  Hearing? 1 0  Comment slight HOH   Vision? 0 0  Difficulty concentrating or making decisions? 0 0  Walking or climbing stairs? 0   Dressing or bathing? 0   Doing errands, shopping? 0 0  Preparing Food and eating ? N   Using the Toilet? N   In the past six months, have you accidently leaked urine? Y   Comment at night   Do you have problems with loss of bowel control? N   Managing your Medications? N   Managing your Finances? N   Housekeeping or managing your Housekeeping? N     Patient Care Team: Katrinka Garnette KIDD, MD as PCP - General (Family Medicine) Mealor, Eulas BRAVO, MD as PCP - Electrophysiology (Cardiology) Avram Lupita BRAVO, MD as Consulting Physician (Gastroenterology) Renda Glance, MD as Consulting Physician (Urology) Junior Emmit Ivanoff, MD as Referring Physician (Dermatology)  I have updated your Care Teams any recent Medical Services you may have received from other providers in the past year.     Assessment:   This is a routine wellness examination for Yug.  Hearing/Vision screen Hearing Screening - Comments:: Slight HOH  Vision Screening - Comments:: Wears rx glasses - up to date with routine eye exams with [facility]    Goals Addressed             This Visit's Progress  Patient Stated       Maintain health and activity        Depression Screen     04/12/2024    1:48 PM 10/27/2023   12:57 PM 06/15/2023    9:49 AM 04/16/2023    8:49 AM 11/18/2022     9:21 AM 01/20/2022   12:01 PM 03/20/2021    7:58 AM  PHQ 2/9 Scores  PHQ - 2 Score 1 0 0 1 0 0 0  PHQ- 9 Score   1 2 1       Fall Risk     04/12/2024    1:50 PM 10/27/2023   12:57 PM 04/16/2023    8:48 AM 01/15/2023   12:52 PM 11/18/2022    9:20 AM  Fall Risk   Falls in the past year? 1 1 1 1  0  Number falls in past yr: 1 1 0 1 0  Injury with Fall? 1 0 0 0 0  Comment sore      Risk for fall due to : History of fall(s) No Fall Risks History of fall(s) History of fall(s) No Fall Risks  Risk for fall due to: Comment slipped in garage in sock feet      Follow up Falls prevention discussed  Falls evaluation completed Falls evaluation completed Falls evaluation completed    MEDICARE RISK AT HOME:  Medicare Risk at Home Any stairs in or around the home?: Yes If so, are there any without handrails?: No Home free of loose throw rugs in walkways, pet beds, electrical cords, etc?: Yes Adequate lighting in your home to reduce risk of falls?: Yes Life alert?: No Use of a cane, walker or w/c?: No Grab bars in the bathroom?: No Shower chair or bench in shower?: Yes Elevated toilet seat or a handicapped toilet?: No  TIMED UP AND GO:  Was the test performed?  Yes  Length of time to ambulate 10 feet: 10 sec Gait slow and steady without use of assistive device  Cognitive Function: 6CIT completed        04/12/2024    1:51 PM 03/16/2018    4:23 PM  6CIT Screen  What Year? 0 points 0 points  What month? 0 points 0 points  What time? 0 points 0 points  Count back from 20 0 points 0 points  Months in reverse 0 points 0 points  Repeat phrase 0 points   Total Score 0 points     Immunizations Immunization History  Administered Date(s) Administered   Fluad Quad(high Dose 65+) 04/27/2019, 08/14/2020   INFLUENZA, HIGH DOSE SEASONAL PF 08/26/2017, 06/04/2018   Influenza Split 05/04/2012   PFIZER(Purple Top)SARS-COV-2 Vaccination 10/03/2019, 11/01/2019, 08/14/2020   Pneumococcal  Conjugate-13 02/12/2017   Pneumococcal Polysaccharide-23 07/15/2007   Td 04/05/2003, 12/11/2017   Tetanus 06/05/2014    Screening Tests Health Maintenance  Topic Date Due   Zoster Vaccines- Shingrix (1 of 2) Never done   Influenza Vaccine  03/04/2024   Medicare Annual Wellness (AWV)  04/12/2025   DTaP/Tdap/Td (4 - Tdap) 12/12/2027   Pneumococcal Vaccine: 50+ Years  Completed   HPV VACCINES  Aged Out   Meningococcal B Vaccine  Aged Out   Colonoscopy  Discontinued   COVID-19 Vaccine  Discontinued   Hepatitis C Screening  Discontinued    Health Maintenance Items Addressed: See Nurse Notes at the end of this note  Additional Screening:  Vision Screening: Recommended annual ophthalmology exams for early detection of glaucoma and other disorders of the eye.  Is the patient up to date with their annual eye exam?  Yes  Who is the provider or what is the name of the office in which the patient attends annual eye exams? [facility]   Dental Screening: Recommended annual dental exams for proper oral hygiene  Community Resource Referral / Chronic Care Management: CRR required this visit?  No   CCM required this visit?  No   Plan:    I have personally reviewed and noted the following in the patient's chart:   Medical and social history Use of alcohol, tobacco or illicit drugs  Current medications and supplements including opioid prescriptions. Patient is not currently taking opioid prescriptions. Functional ability and status Nutritional status Physical activity Advanced directives List of other physicians Hospitalizations, surgeries, and ER visits in previous 12 months Vitals Screenings to include cognitive, depression, and falls Referrals and appointments  In addition, I have reviewed and discussed with patient certain preventive protocols, quality metrics, and best practice recommendations. A written personalized care plan for preventive services as well as general  preventive health recommendations were provided to patient.   Ellouise VEAR Haws, LPN   [id number]   After Visit Summary: (In Person-Printed) AVS printed and given to the patient  Notes: Pt declined flu vaccine at this time, Pt had a fall in garage prior to appt stated he was sore declined visit today will follow up at later date.

## 2024-04-12 NOTE — Patient Instructions (Signed)
 Mr. Cabreja,  Thank you for taking the time for your Medicare Wellness Visit. I appreciate your continued commitment to your health goals. Please review the care plan we discussed, and feel free to reach out if I can assist you further.  Medicare recommends these wellness visits once per year to help you and your care team stay ahead of potential health issues. These visits are designed to focus on prevention, allowing your provider to concentrate on managing your acute and chronic conditions during your regular appointments.  Please note that Annual Wellness Visits do not include a physical exam. Some assessments may be limited, especially if the visit was conducted virtually. If needed, we may recommend a separate in-person follow-up with your provider.  Ongoing Care Seeing your primary care provider every 3 to 6 months helps us  monitor your health and provide consistent, personalized care.   Referrals If a referral was made during today's visit and you haven't received any updates within two weeks, please contact the referred provider directly to check on the status.  Recommended Screenings:  Health Maintenance  Topic Date Due   Zoster (Shingles) Vaccine (1 of 2) Never done   Medicare Annual Wellness Visit  09/13/2020   Flu Shot  03/04/2024   DTaP/Tdap/Td vaccine (4 - Tdap) 12/12/2027   Pneumococcal Vaccine for age over 71  Completed   HPV Vaccine  Aged Out   Meningitis B Vaccine  Aged Out   Colon Cancer Screening  Discontinued   COVID-19 Vaccine  Discontinued   Hepatitis C Screening  Discontinued       05/18/2023    6:25 PM  Advanced Directives  Does Patient Have a Medical Advance Directive? Yes  Type of Estate agent of [place];Living will  Does patient want to make changes to medical advance directive? No - Patient declined   Advance Care Planning is important because it: Ensures you receive medical care that aligns with your values, goals, and  preferences. Provides guidance to your family and loved ones, reducing the emotional burden of decision-making during critical moments.  Vision: Annual vision screenings are recommended for early detection of glaucoma, cataracts, and diabetic retinopathy. These exams can also reveal signs of chronic conditions such as diabetes and high blood pressure.  Dental: Annual dental screenings help detect early signs of oral cancer, gum disease, and other conditions linked to overall health, including heart disease and diabetes.  Please see the attached documents for additional preventive care recommendations.

## 2024-04-18 ENCOUNTER — Ambulatory Visit: Admitting: Sports Medicine

## 2024-04-18 ENCOUNTER — Ambulatory Visit: Admitting: Orthopedic Surgery

## 2024-04-18 ENCOUNTER — Ambulatory Visit

## 2024-04-18 VITALS — BP 130/60 | HR 77 | Ht 74.0 in | Wt 215.0 lb

## 2024-04-18 DIAGNOSIS — M79672 Pain in left foot: Secondary | ICD-10-CM

## 2024-04-18 DIAGNOSIS — M1612 Unilateral primary osteoarthritis, left hip: Secondary | ICD-10-CM

## 2024-04-18 DIAGNOSIS — M7989 Other specified soft tissue disorders: Secondary | ICD-10-CM

## 2024-04-18 DIAGNOSIS — S82892A Other fracture of left lower leg, initial encounter for closed fracture: Secondary | ICD-10-CM

## 2024-04-18 DIAGNOSIS — M25552 Pain in left hip: Secondary | ICD-10-CM

## 2024-04-18 DIAGNOSIS — W19XXXA Unspecified fall, initial encounter: Secondary | ICD-10-CM

## 2024-04-18 DIAGNOSIS — S82832A Other fracture of upper and lower end of left fibula, initial encounter for closed fracture: Secondary | ICD-10-CM | POA: Diagnosis not present

## 2024-04-18 DIAGNOSIS — M25562 Pain in left knee: Secondary | ICD-10-CM

## 2024-04-18 DIAGNOSIS — M25572 Pain in left ankle and joints of left foot: Secondary | ICD-10-CM

## 2024-04-18 NOTE — Progress Notes (Signed)
 Ben Powell Halbert D.[facility] [address] [place] [address] Phone: [phone]   Assessment and Plan:     1. Fall, initial encounter 2. Other closed fracture of distal end of left fibula, initial encounter 3. Left foot pain 4. Acute left ankle pain 5. Swelling of left lower extremity -Acute, complicated, initial visit -X-ray obtained in clinic today.  My interpretation: Acute, minimally displaced, oblique distal fibular fracture superior to joint line occurring from fall on 04/12/2024. - This represents a typically unstable fracture with fracture being oblique, starting above joint line.  However, patient has been weightbearing since fracture occurred 6 days ago with only minimal displacement on x-ray.  I do think conservative therapy could be considered with patient having nonweightbearing status, however patient is 83 year old male, who cares for himself, drives himself.  I am concerned that he will not adhere to nonweightbearing status putting him at risk for fracture displacement and joint instability.  Recommend urgent evaluation by orthopedic surgery while fracture is still minimally displaced.  Spoke with orthopedic clinic front desk and patient can be seen in office today. - Recommend nonweightbearing status until evaluated by orthopedic surgery.  Based on patient's level of swelling at today's visit, we will place patient in a boot until evaluated by orthopedic surgery to prevent ankle motion.  Recommend nonweightbearing status.  We do not have crutches that fit patient's height, so patient taken to his car via wheelchair -Of note, patient is on chronic anticoagulation with Xarelto  with PMH A-fib, contributing to swelling, and potentially complicating an orthopedic procedure  6. Primary osteoarthritis of left hip (Primary) 7. Left hip pain 8. Acute pain of left knee -Acute, initial visit - X-rays taken of left knee and left hip due to acute  fall.  My interpretation: No acute fracture or dislocation of knee or hip joints.  Severe degenerative changes of left femoral acetabular joint   Time of visit 48 minutes, which included chart review, physical exam, treatment plan being performed, interpreted, and discussed with patient at today's visit.   Pertinent previous records reviewed include none   Follow Up: As needed   Subjective:   I, Claretha Schimke am a scribe for Dr. Leonce.   Chief Complaint: ankle pain   HPI:   04/18/24 Patient is a 83 year old male with ankle pain. Patient fell last Tuesday. Has nerve pain up and down leg. Patient states that left ankle and leg hurts after fall. Right leg came from under him and left leg was stable but fell on his left side. Only taking tylenol  right now. Zilrelto limits what medication he can take. Left low leg is swollen. The fall was located in his garage. He was wearing slippery soaks.    Relevant Historical Information: Hypertension, CKD stage III, history of CVA on chronic anticoagulation with Xarelto   Additional pertinent review of systems negative.   Current Outpatient Medications:    acetaminophen  (TYLENOL ) 650 MG CR tablet, Take 650 mg by mouth 2 (two) times daily., Disp: , Rfl:    famotidine  (PEPCID ) 20 MG tablet, TAKE 1 TABLET(20 MG) BY MOUTH TWICE DAILY, Disp: 180 tablet, Rfl: 3   metoprolol  succinate (TOPROL -XL) 25 MG 24 hr tablet, TAKE 1 TABLET BY MOUTH DAILY WITH OR IMMEDIATELY FOLLOWING A MEAL, Disp: 90 tablet, Rfl: 3   Multiple Vitamin (MULTIVITAMIN WITH MINERALS) TABS tablet, Take 1 tablet by mouth daily., Disp: , Rfl:    simvastatin  (ZOCOR ) 10 MG tablet, Take 1 tablet (10  mg total) by mouth at bedtime., Disp: 90 tablet, Rfl: 3   VITAMIN D  PO, Take 1 tablet by mouth daily., Disp: , Rfl:    XARELTO  20 MG TABS tablet, TAKE 1 TABLET(20 MG) BY MOUTH DAILY, Disp: 90 tablet, Rfl: 1   Objective:     Vitals:   04/18/24 1327  BP: 130/60  Pulse: 77  SpO2: 99%   Weight: 215 lb (97.5 kg)  Height: 6' 2 (1.88 m)      Body mass index is 27.6 kg/m.    Physical Exam:    Gen: Appears well, nad, nontoxic and pleasant Psych: Alert and oriented, appropriate mood and affect Neuro: sensation intact, strength is 5/5 with df/pf/inv/ev, muscle tone wnl Skin: no susupicious lesions or rashes  Left foot/ankle:  No deformity, with significant swelling of left lower extremity distal to knee TTP lateral malleolus,  NTTP over fibular head,   ROM severely reduced in ankle due to effusion Positive tib-fib squeeze test for pain Negative thompson   pain with resisted inversion or eversion  Gait antalgic, favoring right leg  Electronically signed by:  Odis Mace D.[facility] 2:36 PM 04/18/24

## 2024-04-18 NOTE — Patient Instructions (Addendum)
 No weight bearing with crutches and boot. Appointment today at [facility] [address]  [facility] 3:15 with Dr. Aldona. Follow up as needed.

## 2024-04-19 ENCOUNTER — Encounter: Payer: Self-pay | Admitting: Orthopedic Surgery

## 2024-04-19 ENCOUNTER — Other Ambulatory Visit: Payer: Self-pay | Admitting: Family Medicine

## 2024-04-19 NOTE — Progress Notes (Signed)
 Office Visit Note   Patient: Elijah [name]           Date of Birth: May 07, 1941           MRN: [id number] Visit Date: 04/18/2024              Requested by: Katrinka Garnette KIDD, MD [address] [place],  [place] [address] PCP: Katrinka Garnette KIDD, MD  Chief Complaint  Patient presents with   Left Ankle - Pain    DOI 04/12/2024 distal fib fracture       HPI: Discussed the use of AI scribe software for clinical note transcription with the patient, who gave verbal consent to proceed.  History of Present Illness Anik Wesch is an 83 year old male who presents with a left ankle injury sustained from a fall one week ago.  He sustained an injury to his left ankle following a fall one week ago. He has a history of playing basketball and has experienced multiple ankle and foot injuries in the past. He describes having neuropathy and states he 'cannot feel anything' in the affected area.  He has been managing the pain with Tylenol . He is currently on Xarelto  for atrial fibrillation.  He mentions that he is planning to go to the beach soon.     Assessment & Plan: Visit Diagnoses:  1. Closed fracture of left ankle, initial encounter     Plan: Assessment and Plan Assessment & Plan Nondisplaced left fibular (Weber C) fracture and left ankle injury One week post-fall with nondisplaced Weber C fibular fracture. Ankle mortis congruent, no medial malleolus tenderness. - Follow up in 2-3 weeks. - Repeat three-view radiographs of left ankle. - Instructed to wear fracture boot. - Advised to minimize activities.  Destructive changes at base of left first metatarsal and hallux rigidus of left great toe MTP joint Radiograph shows destructive changes at base of first metatarsal and hallux rigidus of great toe MTP joint.      Follow-Up Instructions: Return in about 2 weeks (around 05/02/2024).   Ortho Exam  Patient is alert, oriented, no adenopathy, well-dressed, normal affect, normal  respiratory effort. Physical Exam EXTREMITIES: Swelling of the left lower extremity with venous stasis changes. Palpable dorsalis pedis pulse. No blistering around foot or ankle. MUSCULOSKELETAL: Base of first metatarsal and head not tender to palpation. Medial malleolus not tender to palpation.  Patient is tender to palpation over the fracture site and the syndesmosis.  Medial lateral ankle ligaments are nontender to palpation.      Imaging: No results found. No images are attached to the encounter.  Labs: Lab Results  Component Value Date   HGBA1C 6.5 10/23/2023   HGBA1C 6.2 04/16/2023   HGBA1C 6.2 11/18/2022   REPTSTATUS 11/17/2022 FINAL 11/15/2022   CULT  11/15/2022    NO GROWTH Performed at [facility], [address] [place], [place] [address]    LABORGA NO GROWTH 12/15/2016     Lab Results  Component Value Date   ALBUMIN 4.0 10/23/2023   ALBUMIN 3.6 08/18/2023   ALBUMIN 3.5 06/15/2023    Lab Results  Component Value Date   MG 2.0 05/15/2016   Lab Results  Component Value Date   VD25OH 63 03/15/2020    No results found for: PREALBUMIN    Latest Ref Rng & Units 10/23/2023    9:23 AM 08/18/2023    8:38 AM 05/19/2023    3:38 AM  CBC EXTENDED  WBC 4.0 - 10.5 K/uL  6.6  7.5  13.0   RBC 4.22 - 5.81 Mil/uL 4.92  4.80  4.46   Hemoglobin 13.0 - 17.0 g/dL 85.0  85.3  85.8   HCT 39.0 - 52.0 % 44.7  44.7  41.7   Platelets 150.0 - 400.0 K/uL 219.0  222  155   NEUT# 1.4 - 7.7 K/uL 4.5  5.7    Lymph# 0.7 - 4.0 K/uL 1.0  0.8       There is no height or weight on file to calculate BMI.  Orders:  No orders of the defined types were placed in this encounter.  No orders of the defined types were placed in this encounter.    Procedures: No procedures performed  Clinical Data: No additional findings.  ROS:  All other systems negative, except as noted in the HPI. Review of Systems  Objective: Vital Signs: There were no vitals taken for this  visit.  Specialty Comments:  No specialty comments available.  PMFS History: Patient Active Problem List   Diagnosis Date Noted   Osteoarthritis of right hip 05/18/2023   Urinary and fecal incontinence 10/14/2021   Spinal stenosis of lumbar region 09/23/2021   Double vision 12/14/2017   Gross hematuria 02/12/2017   Hyperglycemia 08/13/2016   History of CVA (cerebrovascular accident) 05/14/2016   Thyroid  nodule    History of melanoma 12/04/2015   GERD (gastroesophageal reflux disease) 12/04/2015   CKD (chronic kidney disease), stage III (HCC) 12/07/2014   Osteoarthritis of left hip 08/09/2014   Actinic keratosis 06/05/2014   History of prostate cancer 09/23/2012   Hx of adenomatous colonic polyps 06/05/2011   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   Essential hypertension 04/07/2007   Hyperlipidemia 04/06/2007   Atrial fibrillation (HCC) 04/06/2007   Past Medical History:  Diagnosis Date   Adenomatous colon polyp    Arthritis 09/10/2011   rt. hip, knee, hands   Cancer (HCC) 09/10/2011   dx. Prostate cancer-bx. 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   Peripheral neuropathy    Permanent atrial fibrillation (HCC) 09/10/2011   Pre-diabetes    Prostate cancer (HCC)    Stroke (HCC)    Thyroid  disease     Family History  Problem Relation Age of Onset   Diabetes Mother    Hypertension Mother    Diverticulosis Mother    Heart attack Father 17       MI. died at 41   Prostate cancer Brother    Alzheimer's disease Paternal Grandmother    Heart attack Paternal Grandfather    Colon cancer Neg Hx     Past Surgical History:  Procedure Laterality Date   COLONOSCOPY   04/2003, 06/05/2011   diverticulosis, internal and external hemorrhoids 2004 and 2012, 2 small polyps 2012   EYE SURGERY  09/10/2011   Only stitches to close cut, and observation of hematoma from injury, cataracts bilateral   HERNIA REPAIR  2019   INGUINAL HERNIA REPAIR Right 06/16/2019   Procedure: OPEN RIGHT INGUINAL HERNIA REPAIR;  Surgeon: Ethyl Lenis, MD;  Location: [facility] OR;  Service: General;  Laterality: Right;  OPEN RIGHT INGUINAL HERNIA REPAIR   JOINT REPLACEMENT  10-24   LUMBAR LAMINECTOMY/DECOMPRESSION MICRODISCECTOMY N/A 09/23/2021   Procedure: Laminectomy and Foraminotomy - Lumbar Two- Lumbar Three - Lumbar Three- Lumbar Four - Lumbar Four- Lumbar Five;  Surgeon: Onetha Kuba, MD;  Location: [facility] OR;  Service: Neurosurgery;  Laterality: N/A;  Laminectomy and Foraminotomy - Lumbar Two- Lumbar Three - Lumbar Three- Lumbar Four - Lumbar Four- Lumbar Five   melanoma surgery      ROBOT ASSISTED LAPAROSCOPIC RADICAL PROSTATECTOMY  09/15/2011   Procedure: ROBOTIC ASSISTED LAPAROSCOPIC RADICAL PROSTATECTOMY LEVEL 2;  Surgeon: Noretta Ferrara, MD;  Location: [facility] ORS;  Service: Urology;  Laterality: N/A;         SPINE SURGERY  2-23   TONSILLECTOMY     TOTAL HIP ARTHROPLASTY Right 05/18/2023   Procedure: TOTAL HIP ARTHROPLASTY ANTERIOR APPROACH;  Surgeon: Melodi Lerner, MD;  Location: [facility] OR[facility];  Service: Orthopedics;  Laterality: Right;   Social History   Occupational History   Occupation: Programmer, applications: RETIRED  Tobacco Use   Smoking status: Former    Current packs/day: 0.00    Types: Cigarettes    Quit date: 08/04/1968    Years since quitting: 55.7   Smokeless tobacco: Never  Vaping Use   Vaping status: Never Used  Substance and Sexual Activity   Alcohol use: No    Comment: none in 20 yrs   Drug use: No   Sexual activity: Not Currently

## 2024-04-20 ENCOUNTER — Ambulatory Visit: Admitting: Sports Medicine

## 2024-04-22 ENCOUNTER — Ambulatory Visit: Attending: Cardiovascular Disease | Admitting: Cardiovascular Disease

## 2024-04-22 ENCOUNTER — Ambulatory Visit

## 2024-04-22 ENCOUNTER — Encounter: Payer: Self-pay | Admitting: Cardiovascular Disease

## 2024-04-22 VITALS — BP 150/72 | HR 57 | Ht 74.0 in | Wt 214.0 lb

## 2024-04-22 DIAGNOSIS — I4510 Unspecified right bundle-branch block: Secondary | ICD-10-CM | POA: Diagnosis not present

## 2024-04-22 DIAGNOSIS — I493 Ventricular premature depolarization: Secondary | ICD-10-CM

## 2024-04-22 DIAGNOSIS — I4821 Permanent atrial fibrillation: Secondary | ICD-10-CM

## 2024-04-22 NOTE — Progress Notes (Signed)
   PCP:  Elijah Garnette KIDD, MD Primary Cardiologist: None Electrophysiologist: Elijah FORBES Furbish, MD   Elijah [name] is a 83 y.o. male seen today for Elijah FORBES Furbish, MD for routine electrophysiology followup.    Since last being seen in our clinic, by Elijah [name] 12/06/2023, the patient reports doing very well overall.   He has not had any episodes of dizziness recently.  He never has palpitations.    Physical Exam: Vitals:   04/22/24 0931  BP: (!) 150/72  Pulse: (!) 57  SpO2: 96%  Weight: 214 lb (97.1 kg)  Height: 6' 2 (1.88 m)     Gen: Appears comfortable, well-nourished CV: iRRR, no dependent edema Pulm: breathing easily   EKG Interpretation Date/Time:  Friday April 22 2024 09:34:36 EDT Ventricular Rate:  57 PR Interval:    QRS Duration:  134 QT Interval:  476 QTC Calculation: 463 R Axis:   103  Text Interpretation: Atrial fibrillation with slow ventricular response with premature ventricular or aberrantly conducted complexes Right bundle branch block When compared with ECG of 19-Aug-2023 12:00, T wave inversion no longer evident in Anterior leads Confirmed by Elijah [name] [phone]) on 04/22/2024 9:58:14 AM   Additional studies reviewed include: Previous EP office notes.   Assessment and Plan:  Permanent AF  asymptomatic Continue Xarelto  for CHA2DS2-VASc of at least 4. Rate is rather aggressively controlled, and he has had episodes of dizziness -- I will DC metoprolol  and place 24 hour monitor in about 5 days to assess rates and PVC burden.    HTN Stable on current regimen   PVCs Low burden   Elijah FORBES Furbish, MD  04/22/24 9:58 AM

## 2024-04-22 NOTE — Patient Instructions (Addendum)
 Medication Instructions:  Your physician has recommended you make the following change in your medication:  STOP Metoprolol   *If you need a refill on your cardiac medications before your next appointment, please call your pharmacy*  Lab Work: None ordered  If you have any lab test that is abnormal or we need to change your treatment, we will call you to review the results.  Testing/Procedures:                           GEOFFRY HEWS- Long Term Monitor Instructions  Your physician has requested you wear a ZIO patch monitor for 24 hour - do not start this for at least 1 week.  This is a single patch monitor. [employer] supplies one patch monitor per enrollment. Additional stickers are not available. Please do not apply patch if you will be having a Nuclear Stress Test,  Echocardiogram, Cardiac CT, MRI, or Chest Xray during the period you would be wearing the  monitor. The patch cannot be worn during these tests. You cannot remove and re-apply the  ZIO XT patch monitor.  Your ZIO patch monitor will be mailed 3 day USPS to your address on file. It may take 3-5 days  to receive your monitor after you have been enrolled.  Once you have received your monitor, please review the enclosed instructions. Your monitor  has already been registered assigning a specific monitor serial # to you.  Billing and Patient Assistance Program Information  We have supplied [employer] with any of your insurance information on file for billing purposes. [employer] offers a sliding scale Patient Assistance Program for patients that do not have  insurance, or whose insurance does not completely cover the cost of the ZIO monitor.  You must apply for the Patient Assistance Program to qualify for this discounted rate.  To apply, please call [employer] at [phone], select option 4, select option 2, ask to apply for  Patient Assistance Program. Meredeth will ask your household income, and how many people  are in your household.  They will quote your out-of-pocket cost based on that information.  [employer] will also be able to set up a 28-month, interest-free payment plan if needed.  Applying the monitor   Shave hair from upper left chest.  Hold abrader disc by orange tab. Rub abrader in 40 strokes over the upper left chest as  indicated in your monitor instructions.  Clean area with 4 enclosed alcohol pads. Let dry.  Apply patch as indicated in monitor instructions. Patch will be placed under collarbone on left  side of chest with arrow pointing upward.  Rub patch adhesive wings for 2 minutes. Remove white label marked 1. Remove the white  label marked 2. Rub patch adhesive wings for 2 additional minutes.  While looking in a mirror, press and release button in center of patch. A small green light will  flash 3-4 times. This will be your only indicator that the monitor has been turned on.  Do not shower for the first 24 hours. You may shower after the first 24 hours.  Press the button if you feel a symptom. You will hear a small click. Record Date, Time and  Symptom in the Patient Logbook.  When you are ready to remove the patch, follow instructions on the last 2 pages of Patient  Logbook. Stick patch monitor onto the last page of Patient Logbook.  Place Patient Logbook in the blue and white box. Use  locking tab on box and tape box closed  securely. The blue and white box has prepaid postage on it. Please place it in the mailbox as  soon as possible. Your physician should have your test results approximately 7 days after the  monitor has been mailed back to [facility].  Call [facility] Customer Care at [phone] if you have questions regarding  your ZIO XT patch monitor. Call them immediately if you see an orange light blinking on your  monitor.  If your monitor falls off in less than 4 days, contact our Monitor department at [phone].  If your monitor becomes loose or falls off after 4 days call  [employer] at [phone] for  suggestions on securing your monitor    Follow-Up: At [facility], you and your health needs are our priority.  As part of our continuing mission to provide you with exceptional heart care, our providers are all part of one team.  This team includes your primary Cardiologist (physician) and Advanced Practice Providers or APPs (Physician Assistants and Nurse Practitioners) who all work together to provide you with the care you need, when you need it.  Your next appointment:   2 month(s)  Provider:   Dr. Nancey   Thank you for choosing [facility]!!   [phone]

## 2024-04-22 NOTE — Addendum Note (Signed)
 Addended by: GRETEL MAEOLA CROME on: 04/22/2024 10:20 AM   Modules accepted: Orders

## 2024-04-22 NOTE — Progress Notes (Unsigned)
 Enrolled patient for a 3 day Zio XT monitor to be mailed to patients home

## 2024-04-25 ENCOUNTER — Ambulatory Visit: Payer: Self-pay | Admitting: Sports Medicine

## 2024-05-03 ENCOUNTER — Encounter: Payer: Self-pay | Admitting: Family Medicine

## 2024-05-03 ENCOUNTER — Ambulatory Visit: Payer: Self-pay | Admitting: Family Medicine

## 2024-05-03 ENCOUNTER — Ambulatory Visit: Admitting: Physician Assistant

## 2024-05-03 ENCOUNTER — Ambulatory Visit: Admitting: Family Medicine

## 2024-05-03 VITALS — BP 128/62 | HR 57 | Temp 97.5°F | Ht 74.0 in | Wt 213.6 lb

## 2024-05-03 DIAGNOSIS — I10 Essential (primary) hypertension: Secondary | ICD-10-CM

## 2024-05-03 DIAGNOSIS — I4821 Permanent atrial fibrillation: Secondary | ICD-10-CM

## 2024-05-03 DIAGNOSIS — R739 Hyperglycemia, unspecified: Secondary | ICD-10-CM

## 2024-05-03 DIAGNOSIS — Z8546 Personal history of malignant neoplasm of prostate: Secondary | ICD-10-CM | POA: Diagnosis not present

## 2024-05-03 DIAGNOSIS — Z131 Encounter for screening for diabetes mellitus: Secondary | ICD-10-CM

## 2024-05-03 DIAGNOSIS — E785 Hyperlipidemia, unspecified: Secondary | ICD-10-CM | POA: Diagnosis not present

## 2024-05-03 LAB — LDL CHOLESTEROL, DIRECT: Direct LDL: 85 mg/dL

## 2024-05-03 LAB — CBC WITH DIFFERENTIAL/PLATELET
Basophils Absolute: 0.1 K/uL (ref 0.0–0.1)
Basophils Relative: 1.3 % (ref 0.0–3.0)
Eosinophils Absolute: 0.3 K/uL (ref 0.0–0.7)
Eosinophils Relative: 4.3 % (ref 0.0–5.0)
HCT: 43 % (ref 39.0–52.0)
Hemoglobin: 14.4 g/dL (ref 13.0–17.0)
Lymphocytes Relative: 15 % (ref 12.0–46.0)
Lymphs Abs: 1.1 K/uL (ref 0.7–4.0)
MCHC: 33.5 g/dL (ref 30.0–36.0)
MCV: 92.5 fl (ref 78.0–100.0)
Monocytes Absolute: 0.9 K/uL (ref 0.1–1.0)
Monocytes Relative: 11.4 % (ref 3.0–12.0)
Neutro Abs: 5.2 K/uL (ref 1.4–7.7)
Neutrophils Relative %: 68 % (ref 43.0–77.0)
Platelets: 229 K/uL (ref 150.0–400.0)
RBC: 4.65 Mil/uL (ref 4.22–5.81)
RDW: 13.8 % (ref 11.5–15.5)
WBC: 7.6 K/uL (ref 4.0–10.5)

## 2024-05-03 LAB — COMPREHENSIVE METABOLIC PANEL WITH GFR
ALT: 13 U/L (ref 0–53)
AST: 13 U/L (ref 0–37)
Albumin: 4.1 g/dL (ref 3.5–5.2)
Alkaline Phosphatase: 102 U/L (ref 39–117)
BUN: 22 mg/dL (ref 6–23)
CO2: 29 meq/L (ref 19–32)
Calcium: 9.7 mg/dL (ref 8.4–10.5)
Chloride: 105 meq/L (ref 96–112)
Creatinine, Ser: 1.33 mg/dL (ref 0.40–1.50)
GFR: 49.67 mL/min — ABNORMAL LOW (ref >60.00)
Glucose, Bld: 89 mg/dL (ref 70–99)
Potassium: 5 meq/L (ref 3.5–5.1)
Sodium: 140 meq/L (ref 135–145)
Total Bilirubin: 0.7 mg/dL (ref 0.2–1.2)
Total Protein: 6.5 g/dL (ref 6.0–8.3)

## 2024-05-03 LAB — HEMOGLOBIN A1C: Hgb A1c MFr Bld: 6.3 % (ref 4.6–6.5)

## 2024-05-03 LAB — PSA, MEDICARE: PSA: 0.01 ng/mL — ABNORMAL LOW (ref 0.10–4.00)

## 2024-05-03 NOTE — Patient Instructions (Addendum)
 Please stop by lab before you go If you have mychart- we will send your results within 3 business days of us  receiving them.  If you do not have mychart- we will call you about results within 5 business days of us  receiving them.  *please also note that you will see labs on mychart as soon as they post. I will later go in and write notes on them- will say notes from Dr. Katrinka   No changes today unless labs lead us  to make changes  Wear your boot pretty please!   Recommended follow up: Return in about 6 months (around 10/31/2024) for physical or sooner if needed.Schedule b4 you leave.

## 2024-05-03 NOTE — Progress Notes (Signed)
 Phone [phone] In person visit   Subjective:   Elijah [name] is a 83 y.o. year old very pleasant male patient who presents for/with See problem oriented charting Chief Complaint  Patient presents with   Medical Management of Chronic Issues    6 month follow up; would like to discuss cardiologist visits and get your opinion;    Cough   Past Medical History-  Patient Active Problem List   Diagnosis Date Noted   History of CVA (cerebrovascular accident) 05/14/2016    Priority: High   History of melanoma 12/04/2015    Priority: High   Atrial fibrillation (HCC) 04/06/2007    Priority: High   Hyperglycemia 08/13/2016    Priority: Medium    Thyroid  nodule     Priority: Medium    GERD (gastroesophageal reflux disease) 12/04/2015    Priority: Medium    CKD (chronic kidney disease), stage III (HCC) 12/07/2014    Priority: Medium    History of prostate cancer 09/23/2012    Priority: Medium    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    Priority: Medium    Essential hypertension 04/07/2007    Priority: Medium    Hyperlipidemia 04/06/2007    Priority: Medium    Double vision 12/14/2017    Priority: Low   Gross hematuria 02/12/2017    Priority: Low   Osteoarthritis of left hip 08/09/2014    Priority: Low   Actinic keratosis 06/05/2014    Priority: Low   Hx of adenomatous colonic polyps 06/05/2011    Priority: Low   Osteoarthritis of right hip 05/18/2023   Urinary and fecal incontinence 10/14/2021   Spinal stenosis of lumbar region 09/23/2021    Medications- reviewed and updated Current Outpatient Medications  Medication Sig Dispense Refill   acetaminophen  (TYLENOL ) 650 MG CR tablet Take 650 mg by mouth 2 (two) times daily.     famotidine  (PEPCID ) 20 MG tablet TAKE 1 TABLET(20 MG) BY MOUTH TWICE DAILY 180 tablet 3   Multiple Vitamin (MULTIVITAMIN WITH MINERALS) TABS tablet Take 1 tablet by mouth daily.     simvastatin  (ZOCOR ) 10 MG tablet Take 1 tablet  (10 mg total) by mouth at bedtime. 90 tablet 3   VITAMIN D  PO Take 1 tablet by mouth daily.     XARELTO  20 MG TABS tablet TAKE 1 TABLET(20 MG) BY MOUTH DAILY 90 tablet 1   No current facility-administered medications for this visit.     Objective:  BP 128/62 (BP Location: Left Arm, Patient Position: Sitting, Cuff Size: Normal)   Pulse (!) 57   Temp (!) 97.5 F (36.4 C) (Temporal)   Ht 6' 2 (1.88 m)   Wt 213 lb 9.6 oz (96.9 kg)   SpO2 98%   BMI 27.42 kg/m  Gen: NAD, resting comfortably CV: RRR no murmurs rubs or gallops Lungs: CTAB no crackles, wheeze, rhonchi Abdomen: soft/nontender/nondistended/normal bowel sounds. No rebound or guarding.  Ext: 1+ edema on left, trace to 1+ on right Skin: warm, dry Not wearing walking boot- encouraged to do so      Assessment and Plan     #Atrial fibrillation- sees Dr. Nancey S: Patient compliant with metoprolol  25 mg extended release for rate control.  Also uses Xarelto  20 mg for anticoagulation. -Metoprolol  25 mg extended release b y Dr. Nancey 04/22/2024 due to aggressively controlled rate and episodes of dizziness-recommended ZIO monitor- today is his first day off the metoprolol   A/P: a fib is rate controlled and has not had his  metoprolol  in 24 hours- he is experimenting off of medicine to see how he does and has upcoming cardiac monitor planned -anticoagulated appropriately with Xarelto  but Have to monitor GFR and if consistently below 50 may reduce Xarelto  to 15 mg- check today   #Hyperlipidemia/history of stroke with LDL goal under 70 though likely was embolic S: simvastatin  10 mg daily with musle aches and weakness on atorvastatin  and rosuvastatin  Lab Results  Component Value Date   CHOL 175 10/23/2023   HDL 37.80 (L) 10/23/2023   LDLCALC 108 (H) 10/23/2023   LDLDIRECT 65.0 04/16/2023   TRIG 147.0 10/23/2023   CHOLHDL 5 10/23/2023  A/P: have had to continue to drop dose due to side effects- update LDL today       #Hypertension S: Compliant with metoprolol  though primarily for rate control in past- blood pressure controlled off all medication(s) today  A/P: blood pressure well controlled without any medicine for blood pressure at this time     #CKD stage III S: GFR has been in the 40's-50s.  Patient knows to avoid NSAIDs-also particularly with anticoagulation.  Would prefer H2 blockers over PPI A/P: last GFR just under 50- update today- prefer over 50 if going of the maintain xarelto  at current dose   # Hyperglycemia/insulin resistance/prediabetes- a1c up to 6.5 x1  S:  Medication: none Exercise and diet- [employer] 5-6 days a week, trying to eat reasonably healthy Lab Results  Component Value Date   HGBA1C 6.5 10/23/2023   HGBA1C 6.2 04/16/2023   HGBA1C 6.2 11/18/2022  A/P: prediabetes noted but if a1c 6.5 or above will need to diagnose diabetes   #slight dry cough still- reassuring CXR march 2025. Could be reflux related but still on Pepcid    #history of prostate cancer- PSA trend low risk in past- update today. Doesn't see urology anymore  #Ankle fracture- has clsoe follow up with Dr. Harden next week. Slip in garage- discussed importance of avoiding falls and not wearing slippery socks. Not wearing his boot- encouraged him to do so  Recommended follow up: No follow-ups on file. Future Appointments  Date Time Provider Department Center  05/09/2024  1:15 PM Harden Jerona GAILS, MD [facility] None  06/22/2024 10:30 AM Mealor, Eulas BRAVO, MD [facility]  04/18/2025  1:40 PM [facility] ANNUAL WELLNESS VISIT 1 [facility] Willo Milian    Lab/Order associations:   ICD-10-CM   1. Permanent atrial fibrillation (HCC)  I48.21     2. Hyperlipidemia, unspecified hyperlipidemia type  E78.5 Comprehensive metabolic panel with GFR    CBC with Differential/Platelet    LDL cholesterol, direct    3. Hyperglycemia  R73.9 Hemoglobin A1c    4. Essential hypertension  I10 Comprehensive metabolic panel with GFR    CBC with  Differential/Platelet    LDL cholesterol, direct    5. Screening for diabetes mellitus  Z13.1 Hemoglobin A1c    6. History of prostate cancer  Z85.46 PSA, Medicare      No orders of the defined types were placed in this encounter.   Return precautions advised.  Garnette Lukes, MD

## 2024-05-09 ENCOUNTER — Ambulatory Visit: Admitting: Orthopedic Surgery

## 2024-05-09 ENCOUNTER — Other Ambulatory Visit (INDEPENDENT_AMBULATORY_CARE_PROVIDER_SITE_OTHER): Payer: Self-pay

## 2024-05-09 DIAGNOSIS — S82892A Other fracture of left lower leg, initial encounter for closed fracture: Secondary | ICD-10-CM

## 2024-05-10 ENCOUNTER — Encounter: Payer: Self-pay | Admitting: Orthopedic Surgery

## 2024-05-10 NOTE — Progress Notes (Signed)
 Office Visit Note   Patient: Elijah [name]           Date of Birth: June 27, 1941           MRN: [id number] Visit Date: 05/09/2024              Requested by: Katrinka Garnette KIDD, MD [address] [place],  [place] [address] PCP: Katrinka Garnette KIDD, MD  Chief Complaint  Patient presents with   Left Ankle - Fracture    DOI 04/12/2024 distal fib fracture      HPI: Discussed the use of AI scribe software for clinical note transcription with the patient, who gave verbal consent to proceed.  History of Present Illness Elijah [name] is an 83 year old male who presents for follow-up of a nondisplaced Weber B fibular fracture.  He is status post nondisplaced Weber B fibular fracture and has not been wearing the fracture boot, presenting in sneakers. He has not experienced any pain since the injury occurred. No pain on the inside of the ankle and no pain with range of motion. He wears compression socks daily to manage swelling.  He notes some swelling in the ankle, though there is no blistering.     Assessment & Plan: Visit Diagnoses:  1. Closed fracture of left ankle, initial encounter     Plan: Assessment and Plan Assessment & Plan Nondisplaced Weber B fracture of left fibula with associated ankle swelling Radiograph indicated healing with callus formation, maintained joint alignment, and no posterior malleolar fracture. No tenderness or pain with motion, some swelling present. - Advise continuation of normal activities as tolerated, avoiding high impact or high loading activities. - Recommend wearing compression socks to reduce swelling. - Instruct to return to the clinic if symptoms worsen or do not improve.      Follow-Up Instructions: Return if symptoms worsen or fail to improve.   Ortho Exam  Patient is alert, oriented, no adenopathy, well-dressed, normal affect, normal respiratory effort. Physical Exam MUSCULOSKELETAL: Gait slow, normal. Medial and lateral ankle non-tender  to palpation. Ankle with some swelling, no blistering. Ankle range of motion without pain.      Imaging: XR Ankle Complete Left Result Date: 05/10/2024 Three-view radiographs of the left ankle shows a congruent mortise with healing of the fibular fracture.  No images are attached to the encounter.  Labs: Lab Results  Component Value Date   HGBA1C 6.3 05/03/2024   HGBA1C 6.5 10/23/2023   HGBA1C 6.2 04/16/2023   REPTSTATUS 11/17/2022 FINAL 11/15/2022   CULT  11/15/2022    NO GROWTH Performed at [facility], [address] [place], [place] [address]    LABORGA NO GROWTH 12/15/2016     Lab Results  Component Value Date   ALBUMIN 4.1 05/03/2024   ALBUMIN 4.0 10/23/2023   ALBUMIN 3.6 08/18/2023    Lab Results  Component Value Date   MG 2.0 05/15/2016   Lab Results  Component Value Date   VD25OH 63 03/15/2020    No results found for: PREALBUMIN    Latest Ref Rng & Units 05/03/2024    1:43 PM 10/23/2023    9:23 AM 08/18/2023    8:38 AM  CBC EXTENDED  WBC 4.0 - 10.5 K/uL 7.6  6.6  7.5   RBC 4.22 - 5.81 Mil/uL 4.65  4.92  4.80   Hemoglobin 13.0 - 17.0 g/dL 85.5  85.0  85.3   HCT 39.0 - 52.0 % 43.0  44.7  44.7  Platelets 150.0 - 400.0 K/uL 229.0  219.0  222   NEUT# 1.4 - 7.7 K/uL 5.2  4.5  5.7   Lymph# 0.7 - 4.0 K/uL 1.1  1.0  0.8      There is no height or weight on file to calculate BMI.  Orders:  Orders Placed This Encounter  Procedures   XR Ankle Complete Left   No orders of the defined types were placed in this encounter.    Procedures: No procedures performed  Clinical Data: No additional findings.  ROS:  All other systems negative, except as noted in the HPI. Review of Systems  Objective: Vital Signs: There were no vitals taken for this visit.  Specialty Comments:  No specialty comments available.  PMFS History: Patient Active Problem List   Diagnosis Date Noted   Osteoarthritis of right hip 05/18/2023   Urinary and fecal  incontinence 10/14/2021   Spinal stenosis of lumbar region 09/23/2021   Double vision 12/14/2017   Gross hematuria 02/12/2017   Hyperglycemia 08/13/2016   History of CVA (cerebrovascular accident) 05/14/2016   Thyroid  nodule    History of melanoma 12/04/2015   GERD (gastroesophageal reflux disease) 12/04/2015   CKD (chronic kidney disease), stage III (HCC) 12/07/2014   Osteoarthritis of left hip 08/09/2014   Actinic keratosis 06/05/2014   History of prostate cancer 09/23/2012   Hx of adenomatous colonic polyps 06/05/2011   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   Essential hypertension 04/07/2007   Hyperlipidemia 04/06/2007   Atrial fibrillation (HCC) 04/06/2007   Past Medical History:  Diagnosis Date   Adenomatous colon polyp    Arthritis 09/10/2011   rt. hip, knee, hands   Cancer (HCC) 09/10/2011   dx. Prostate cancer-bx. 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   Peripheral neuropathy    Permanent atrial fibrillation (HCC) 09/10/2011   Pre-diabetes    Prostate cancer (HCC)    Stroke (HCC)    Thyroid  disease     Family History  Problem Relation Age of Onset   Diabetes Mother    Hypertension Mother    Diverticulosis Mother    Heart attack Father 25       MI. died at 45   Prostate cancer Brother    Alzheimer's disease Paternal Grandmother    Heart attack Paternal Grandfather    Colon cancer Neg Hx     Past Surgical History:  Procedure Laterality Date   COLONOSCOPY  04/2003, 06/05/2011   diverticulosis, internal and external hemorrhoids 2004 and 2012, 2 small polyps 2012   EYE SURGERY  09/10/2011   Only stitches to close cut, and observation of hematoma from injury,  cataracts bilateral   HERNIA REPAIR  2019   INGUINAL HERNIA REPAIR Right 06/16/2019   Procedure: OPEN RIGHT INGUINAL HERNIA REPAIR;  Surgeon: Ethyl Lenis, MD;  Location: [facility] OR;  Service: General;  Laterality: Right;  OPEN RIGHT INGUINAL HERNIA REPAIR   JOINT REPLACEMENT  10-24   LUMBAR LAMINECTOMY/DECOMPRESSION MICRODISCECTOMY N/A 09/23/2021   Procedure: Laminectomy and Foraminotomy - Lumbar Two- Lumbar Three - Lumbar Three- Lumbar Four - Lumbar Four- Lumbar Five;  Surgeon: Onetha Kuba, MD;  Location: [facility] OR;  Service: Neurosurgery;  Laterality: N/A;  Laminectomy and Foraminotomy - Lumbar Two- Lumbar Three - Lumbar Three- Lumbar Four - Lumbar Four- Lumbar Five   melanoma surgery      ROBOT ASSISTED LAPAROSCOPIC RADICAL PROSTATECTOMY  09/15/2011   Procedure: ROBOTIC ASSISTED LAPAROSCOPIC RADICAL PROSTATECTOMY LEVEL 2;  Surgeon: Noretta Ferrara, MD;  Location: [facility];  Service: Urology;  Laterality: N/A;         SPINE SURGERY  2-23   TONSILLECTOMY     TOTAL HIP ARTHROPLASTY Right 05/18/2023   Procedure: TOTAL HIP ARTHROPLASTY ANTERIOR APPROACH;  Surgeon: Melodi Lerner, MD;  Location: [facility];  Service: Orthopedics;  Laterality: Right;   Social History   Occupational History   Occupation: Programmer, applications: RETIRED  Tobacco Use   Smoking status: Former    Current packs/day: 0.00    Types: Cigarettes    Quit date: 08/04/1968    Years since quitting: 55.8   Smokeless tobacco: Never  Vaping Use   Vaping status: Never Used  Substance and Sexual Activity   Alcohol use: No    Comment: none in 20 yrs   Drug use: No   Sexual activity: Not Currently

## 2024-05-30 DIAGNOSIS — I493 Ventricular premature depolarization: Secondary | ICD-10-CM

## 2024-06-06 ENCOUNTER — Encounter: Payer: Self-pay | Admitting: Radiology

## 2024-06-22 ENCOUNTER — Encounter: Payer: Self-pay | Admitting: Cardiovascular Disease

## 2024-06-22 ENCOUNTER — Ambulatory Visit: Attending: Cardiovascular Disease | Admitting: Cardiovascular Disease

## 2024-06-22 VITALS — BP 147/81 | HR 80 | Resp 16 | Ht 74.0 in | Wt 212.9 lb

## 2024-06-22 DIAGNOSIS — I493 Ventricular premature depolarization: Secondary | ICD-10-CM | POA: Diagnosis not present

## 2024-06-22 DIAGNOSIS — I4821 Permanent atrial fibrillation: Secondary | ICD-10-CM | POA: Diagnosis not present

## 2024-06-22 NOTE — Patient Instructions (Signed)
 Medication Instructions:  Your physician recommends that you continue on your current medications as directed. Please refer to the Current Medication list given to you today.  *If you need a refill on your cardiac medications before your next appointment, please call your pharmacy*  Lab Work: None ordered.  If you have labs (blood work) drawn today and your tests are completely normal, you will receive your results only by: MyChart Message (if you have MyChart) OR A paper copy in the mail If you have any lab test that is abnormal or we need to change your treatment, we will call you to review the results.  Testing/Procedures: None ordered.   Follow-Up: At [facility], you and your health needs are our priority.  As part of our continuing mission to provide you with exceptional heart care, our providers are all part of one team.  This team includes your primary Cardiologist (physician) and Advanced Practice Providers or APPs (Physician Assistants and Nurse Practitioners) who all work together to provide you with the care you need, when you need it.  Your next appointment:   12 months with EP APP

## 2024-06-22 NOTE — Progress Notes (Signed)
   PCP:  Katrinka Garnette KIDD, MD Primary Cardiologist: None Electrophysiologist: Elijah FORBES Furbish, MD   Elijah [name] is a 83 y.o. male seen today for Elijah FORBES Furbish, MD for routine electrophysiology followup.    Since last being seen in our clinic, by Elijah [name] 12/06/2023, the patient reports doing very well overall.   He has not had any episodes of dizziness recently.  He never has palpitations.  He presents today for follow-up after wearing monitor to assess PVC burden and ventricular rates off metoprolol .    Physical Exam: Vitals:   06/22/24 1122  BP: (!) 147/81  Pulse: 80  Resp: 16  SpO2: 96%  Weight: 212 lb 14.4 oz (96.6 kg)  Height: 6' 2 (1.88 m)     Gen: Appears comfortable, well-nourished CV: iRRR, no dependent edema Pulm: breathing easily       Monitor October 2025 Predominant rhythm: Atrial fibrillation Heart rate: 49-113 bpm, AVG 73 bpm Frequent (9.3%) ventricular ectopy Other arrhythmia detected: NSVT, longest 2.7 seconds Patient triggered events: None  Additional studies reviewed include: Previous EP office notes.   Assessment and Plan:  Permanent AF  asymptomatic Continue Xarelto  for CHA2DS2-VASc of at least 4. Rate is rather aggressively controlled, and he has had episodes of dizziness --doing well off metoprolol  with controlled rates    HTN Stable on current regimen   PVCs Burden less than 10%.  Asymptomatic.  No plans for intervention   Elijah FORBES Furbish, MD  06/22/24 11:49 AM

## 2024-10-27 ENCOUNTER — Encounter: Admitting: Family Medicine

## 2025-04-18 ENCOUNTER — Ambulatory Visit
